# Patient Record
Sex: Female | Born: 1950 | Race: Black or African American | Hispanic: No | Marital: Married | Smoking: Former smoker
Health system: Southern US, Community
[De-identification: ages and names within clinical notes are randomized; demographics above are authoritative.]

## PROBLEM LIST (undated history)

## (undated) ENCOUNTER — Emergency Department (HOSPITAL_COMMUNITY): Admission: EM | Payer: Medicare HMO | Source: Home / Self Care

## (undated) DIAGNOSIS — I5082 Biventricular heart failure: Secondary | ICD-10-CM

## (undated) DIAGNOSIS — I10 Essential (primary) hypertension: Secondary | ICD-10-CM

## (undated) DIAGNOSIS — I2510 Atherosclerotic heart disease of native coronary artery without angina pectoris: Secondary | ICD-10-CM

## (undated) DIAGNOSIS — I509 Heart failure, unspecified: Secondary | ICD-10-CM

## (undated) DIAGNOSIS — K219 Gastro-esophageal reflux disease without esophagitis: Secondary | ICD-10-CM

## (undated) DIAGNOSIS — E7800 Pure hypercholesterolemia, unspecified: Secondary | ICD-10-CM

## (undated) DIAGNOSIS — E119 Type 2 diabetes mellitus without complications: Secondary | ICD-10-CM

## (undated) DIAGNOSIS — M109 Gout, unspecified: Secondary | ICD-10-CM

## (undated) DIAGNOSIS — K259 Gastric ulcer, unspecified as acute or chronic, without hemorrhage or perforation: Secondary | ICD-10-CM

## (undated) DIAGNOSIS — Z8739 Personal history of other diseases of the musculoskeletal system and connective tissue: Secondary | ICD-10-CM

## (undated) DIAGNOSIS — IMO0001 Reserved for inherently not codable concepts without codable children: Secondary | ICD-10-CM

## (undated) HISTORY — PX: TONSILLECTOMY: SUR1361

---

## 2002-10-01 ENCOUNTER — Encounter: Admission: RE | Admit: 2002-10-01 | Discharge: 2002-12-30 | Payer: Self-pay | Admitting: Family Medicine

## 2005-04-18 ENCOUNTER — Emergency Department (HOSPITAL_COMMUNITY): Admission: EM | Admit: 2005-04-18 | Discharge: 2005-04-18 | Payer: Self-pay | Admitting: Emergency Medicine

## 2005-05-16 ENCOUNTER — Ambulatory Visit: Payer: Self-pay | Admitting: Internal Medicine

## 2005-05-18 ENCOUNTER — Emergency Department (HOSPITAL_COMMUNITY): Admission: EM | Admit: 2005-05-18 | Discharge: 2005-05-18 | Payer: Self-pay | Admitting: Emergency Medicine

## 2005-06-22 ENCOUNTER — Ambulatory Visit: Payer: Self-pay | Admitting: Internal Medicine

## 2005-06-29 ENCOUNTER — Ambulatory Visit (HOSPITAL_COMMUNITY): Admission: RE | Admit: 2005-06-29 | Discharge: 2005-06-29 | Payer: Self-pay | Admitting: Internal Medicine

## 2005-06-29 ENCOUNTER — Ambulatory Visit: Payer: Self-pay | Admitting: Internal Medicine

## 2006-05-24 DIAGNOSIS — E1129 Type 2 diabetes mellitus with other diabetic kidney complication: Secondary | ICD-10-CM

## 2006-05-24 DIAGNOSIS — I10 Essential (primary) hypertension: Secondary | ICD-10-CM | POA: Insufficient documentation

## 2006-05-24 DIAGNOSIS — E785 Hyperlipidemia, unspecified: Secondary | ICD-10-CM

## 2006-07-21 DIAGNOSIS — R809 Proteinuria, unspecified: Secondary | ICD-10-CM | POA: Insufficient documentation

## 2006-09-12 ENCOUNTER — Emergency Department (HOSPITAL_COMMUNITY): Admission: EM | Admit: 2006-09-12 | Discharge: 2006-09-12 | Payer: Self-pay | Admitting: Emergency Medicine

## 2006-09-25 ENCOUNTER — Telehealth: Payer: Self-pay | Admitting: *Deleted

## 2006-10-03 ENCOUNTER — Encounter (HOSPITAL_COMMUNITY): Admission: RE | Admit: 2006-10-03 | Discharge: 2006-10-03 | Payer: Self-pay | Admitting: Cardiology

## 2006-10-04 ENCOUNTER — Ambulatory Visit (HOSPITAL_COMMUNITY): Admission: RE | Admit: 2006-10-04 | Discharge: 2006-10-04 | Payer: Self-pay | Admitting: Cardiovascular Disease

## 2008-10-22 ENCOUNTER — Inpatient Hospital Stay (HOSPITAL_COMMUNITY): Admission: AD | Admit: 2008-10-22 | Discharge: 2008-10-27 | Payer: Self-pay | Admitting: Cardiovascular Disease

## 2008-10-23 ENCOUNTER — Encounter (INDEPENDENT_AMBULATORY_CARE_PROVIDER_SITE_OTHER): Payer: Self-pay | Admitting: Cardiovascular Disease

## 2010-10-20 LAB — BASIC METABOLIC PANEL
BUN: 20 mg/dL (ref 6–23)
BUN: 22 mg/dL (ref 6–23)
BUN: 27 mg/dL — ABNORMAL HIGH (ref 6–23)
CO2: 26 mEq/L (ref 19–32)
CO2: 30 mEq/L (ref 19–32)
Calcium: 8.7 mg/dL (ref 8.4–10.5)
Calcium: 8.7 mg/dL (ref 8.4–10.5)
Chloride: 100 mEq/L (ref 96–112)
Chloride: 100 mEq/L (ref 96–112)
Chloride: 100 mEq/L (ref 96–112)
Creatinine, Ser: 0.95 mg/dL (ref 0.4–1.2)
Creatinine, Ser: 1.07 mg/dL (ref 0.4–1.2)
Creatinine, Ser: 1.18 mg/dL (ref 0.4–1.2)
GFR calc Af Amer: 57 mL/min — ABNORMAL LOW (ref >60)
GFR calc Af Amer: >60 mL/min (ref >60)
GFR calc non Af Amer: 47 mL/min — ABNORMAL LOW (ref >60)
GFR calc non Af Amer: >60 mL/min (ref >60)
Glucose, Bld: 112 mg/dL — ABNORMAL HIGH (ref 70–99)
Glucose, Bld: 167 mg/dL — ABNORMAL HIGH (ref 70–99)
Glucose, Bld: 178 mg/dL — ABNORMAL HIGH (ref 70–99)
Potassium: 3.4 mEq/L — ABNORMAL LOW (ref 3.5–5.1)
Potassium: 3.7 mEq/L (ref 3.5–5.1)
Potassium: 3.8 mEq/L (ref 3.5–5.1)
Sodium: 139 mEq/L (ref 135–145)
Sodium: 139 mEq/L (ref 135–145)

## 2010-10-20 LAB — GLUCOSE, CAPILLARY
Glucose-Capillary: 108 mg/dL — ABNORMAL HIGH (ref 70–99)
Glucose-Capillary: 136 mg/dL — ABNORMAL HIGH (ref 70–99)
Glucose-Capillary: 162 mg/dL — ABNORMAL HIGH (ref 70–99)
Glucose-Capillary: 165 mg/dL — ABNORMAL HIGH (ref 70–99)
Glucose-Capillary: 170 mg/dL — ABNORMAL HIGH (ref 70–99)
Glucose-Capillary: 177 mg/dL — ABNORMAL HIGH (ref 70–99)
Glucose-Capillary: 178 mg/dL — ABNORMAL HIGH (ref 70–99)
Glucose-Capillary: 215 mg/dL — ABNORMAL HIGH (ref 70–99)
Glucose-Capillary: 236 mg/dL — ABNORMAL HIGH (ref 70–99)
Glucose-Capillary: 248 mg/dL — ABNORMAL HIGH (ref 70–99)
Glucose-Capillary: 255 mg/dL — ABNORMAL HIGH (ref 70–99)
Glucose-Capillary: 85 mg/dL (ref 70–99)
Glucose-Capillary: 93 mg/dL (ref 70–99)
Glucose-Capillary: 205 mg/dL — ABNORMAL HIGH (ref 70–99)
Glucose-Capillary: 93 mg/dL (ref 70–99)

## 2010-10-20 LAB — CBC
HCT: 46.1 % — ABNORMAL HIGH (ref 36.0–46.0)
Hemoglobin: 15.5 g/dL — ABNORMAL HIGH (ref 12.0–15.0)
MCHC: 33.6 g/dL (ref 30.0–36.0)
MCV: 77.3 fL — ABNORMAL LOW (ref 78.0–100.0)
RBC: 5.96 MIL/uL — ABNORMAL HIGH (ref 3.87–5.11)

## 2010-10-20 LAB — COMPREHENSIVE METABOLIC PANEL
ALT: 17 U/L (ref 0–35)
ALT: 28 U/L (ref 0–35)
AST: 18 U/L (ref 0–37)
Albumin: 2.8 g/dL — ABNORMAL LOW (ref 3.5–5.2)
Alkaline Phosphatase: 65 U/L (ref 39–117)
BUN: 27 mg/dL — ABNORMAL HIGH (ref 6–23)
BUN: 14 mg/dL (ref 6–23)
CO2: 28 mEq/L (ref 19–32)
CO2: 22 mEq/L (ref 19–32)
Calcium: 8.8 mg/dL (ref 8.4–10.5)
Calcium: 8.8 mg/dL (ref 8.4–10.5)
Chloride: 99 mEq/L (ref 96–112)
Creatinine, Ser: 1.01 mg/dL (ref 0.4–1.2)
Creatinine, Ser: 0.95 mg/dL (ref 0.4–1.2)
GFR calc Af Amer: >60 mL/min (ref >60)
GFR calc non Af Amer: 57 mL/min — ABNORMAL LOW (ref >60)
GFR calc non Af Amer: >60 mL/min (ref >60)
Glucose, Bld: 137 mg/dL — ABNORMAL HIGH (ref 70–99)
Glucose, Bld: 268 mg/dL — ABNORMAL HIGH (ref 70–99)
Potassium: 4.1 mEq/L (ref 3.5–5.1)
Sodium: 137 mEq/L (ref 135–145)
Total Bilirubin: 0.6 mg/dL (ref 0.3–1.2)
Total Protein: 5.8 g/dL — ABNORMAL LOW (ref 6.0–8.3)

## 2010-10-20 LAB — CARDIAC PANEL(CRET KIN+CKTOT+MB+TROPI)
CK, MB: 1.7 ng/mL (ref 0.3–4.0)
Relative Index: INVALID (ref 0.0–2.5)
Total CK: 77 U/L (ref 7–177)
Troponin I: 0.05 ng/mL (ref 0.00–0.06)

## 2010-10-20 LAB — POCT I-STAT 3, ART BLOOD GAS (G3+)
Acid-base deficit: 1 mmol/L (ref 0.0–2.0)
Bicarbonate: 22.8 mEq/L (ref 20.0–24.0)
O2 Saturation: 84 %
TCO2: 24 mmol/L (ref 0–100)
pCO2 arterial: 33.7 mmHg — ABNORMAL LOW (ref 35.0–45.0)
pH, Arterial: 7.437 — ABNORMAL HIGH (ref 7.350–7.400)
pO2, Arterial: 47 mmHg — ABNORMAL LOW (ref 80.0–100.0)

## 2010-10-20 LAB — POCT I-STAT 3, VENOUS BLOOD GAS (G3P V)
Bicarbonate: 25.2 mEq/L — ABNORMAL HIGH (ref 20.0–24.0)
O2 Saturation: 31 %
TCO2: 27 mmol/L (ref 0–100)
pCO2, Ven: 43.2 mmHg — ABNORMAL LOW (ref 45.0–50.0)
pH, Ven: 7.374 — ABNORMAL HIGH (ref 7.250–7.300)
pO2, Ven: 20 mmHg — CL (ref 30.0–45.0)

## 2010-10-20 LAB — DIFFERENTIAL
Eosinophils Absolute: 0 10*3/uL (ref 0.0–0.7)
Lymphocytes Relative: 22 % (ref 12–46)
Lymphs Abs: 1.4 10*3/uL (ref 0.7–4.0)
Neutro Abs: 4.6 10*3/uL (ref 1.7–7.7)
Neutrophils Relative %: 72 % (ref 43–77)

## 2010-10-20 LAB — PROTIME-INR
INR: 1.1 (ref 0.00–1.49)
Prothrombin Time: 14.3 seconds (ref 11.6–15.2)

## 2010-10-20 LAB — APTT: aPTT: 22 seconds — ABNORMAL LOW (ref 24–37)

## 2010-10-20 LAB — HEMOGLOBIN A1C
Hgb A1c MFr Bld: 11 % — ABNORMAL HIGH (ref 4.6–6.1)
Mean Plasma Glucose: 269 mg/dL

## 2010-11-23 NOTE — Discharge Summary (Signed)
[name], [name]               ACCOUNT NO.:  [id number]   MEDICAL RECORD NO.:  [id number]          PATIENT TYPE:  INP   LOCATION:  3741                         FACILITY:  [facility]   PHYSICIAN:  [name], M.D.  DATE OF BIRTH:  09/11/1950   DATE OF ADMISSION:  10/22/2008  DATE OF DISCHARGE:  10/27/2008                               DISCHARGE SUMMARY   FINAL DIAGNOSES:  1. Uncontrolled hypertension.  2. Diabetes mellitus type 2.  3. Dilated cardiomyopathy.  4. Native vessel coronary artery disease.  5. Hypertension.  6. Obesity.   DISCHARGE MEDICATIONS:  1. Amlodipine 5 mg 1 p.o. daily.  2. Glucotrol XL 5 mg 1 twice daily.  3. Metformin 500 mg 1 twice daily.  4. Imdur 30 mg 1 daily.  5. Metoprolol 50 mg 1 twice daily.  6. Aspirin 81 mg daily.  7. Plavix 75 mg 1 daily.  8. Potassium chloride 20 mEq 1 daily.  9. Clonidine 0.1 mg 1 twice daily.  10.Hydrochlorothiazide 12.5 mg 1 capsule daily.  11.Benicar 40 mg 1 daily.  12.Prilosec 20 mg 1 daily.   DISCHARGE DIET:  Low-sodium, heart-healthy diet,  low-calorie,  carbohydrate-modified, and 1500 mL fluid-restricted diet.   WOUND CARE:  The patient to notify right groin pain, swelling, or  discharge.   ACTIVITY:  The patient to increase activity slowly and stop any activity  that causes chest pain, shortness of breath, dizziness, sweating, or  excessive weakness.   FOLLOWUP:  By Dr. [name] in 1 week.  The patient to call [phone]  for appointment.   HISTORY:  This 60 year old black female presented with dizziness and  weakness with a history of hypertension for 20 years, but out of  medications for 1 year.   PAST MEDICAL HISTORY:  Significant for diabetes mellitus for 5 years,  she quit smoking 18 years ago and also quit alcohol intake 15-20 years  ago, has a history of elevated cholesterol level in the past.   PHYSICAL EXAMINATION:  VITAL SIGNS:  Pulse 110, respirations 16, blood  pressure 200/120, height 5  feet 4 inches, and weight 208 pounds.  HEENT:  The patient is normocephalic, atraumatic, has brown eyes.  Pupils equally reacting to light.  Extraocular movements intact.  NECK:  No JVD.  No carotid bruits and has full range of motion.  LUNGS:  Clear bilaterally.  HEART:  Normal S1 and S2 with grade 2/6 systolic murmur.  ABDOMEN:  Soft.  EXTREMITIES:  Edema 2+ without cyanosis or clubbing.  CNS:  Cranial nerves grossly intact.  The patient moves all 4  extremities.   LABORATORY DATA:  Normal hemoglobin, hematocrit, WBC count, platelet  count.  Normal electrolytes, BUN, and creatinine.  Glucose elevated at  268.  Hemoglobin A1C was elevated at 11.  Subsequent glucose was down to  112.  INR was 1.1. CK-MB and troponin I negative x 2.   Chest x-ray revealed cardiomegaly and vascular congestion.   Ultrasound of the heart revealed dilated cardiomyopathy with a 20-25%  ejection fraction and diffuse hypokinesia, mild aortic regurgitation,  severe mitral regurgitation, and mild  pulmonic valve regurgitation, and  severe tricuspid regurgitation.   Cardiac catheterization showed mild multivessel native vessel coronary  artery disease, severe LV systolic dysfunction, congestive heart  failure, and pulmonary hypertension.   HOSPITAL COURSE:  The patient was admitted to telemetry unit.  Myocardial infarction was ruled out.  She received antihypertensive  medications with a good reduction in her blood pressure. With her  echocardiogram showing poor LV systolic function, she underwent right  and left heart catheterization that showed mild-to-moderate multivessel  native vessel coronary artery disease.  The patient received a few doses  of IV Lasix to decrease bilateral leg edema.  She responded very well to  medications and on October 27, 2008, she was discharged home in  satisfactory condition with blood pressure down to 128/82.  Oxygen  saturation 100% on room air.  She will be followed by me in  1 week.      [name], M.D.  Electronically Signed     ASK/MEDQ  D:  10/27/2008  T:  10/27/2008  Job:  [id number]

## 2010-11-26 NOTE — Cardiovascular Report (Signed)
NAMEDEDRA, MATSUO               ACCOUNT NO.:  [id number]   MEDICAL RECORD NO.:  [id number]          PATIENT TYPE:  OIB   LOCATION:  2899                         FACILITY:  [facility]   PHYSICIAN:  [name], M.D.  DATE OF BIRTH:  1950/08/23   DATE OF PROCEDURE:  10/04/2006  DATE OF DISCHARGE:  10/04/2006                            CARDIAC CATHETERIZATION   PROCEDURE PERFORMED BY:  [name], M.D.   PROCEDURE PERFORMED:  Left heart catheterization, selective coronary  angiography, left ventricular function, and bilateral renal artery  injections.   INDICATIONS FOR PROCEDURE:  This is a 60 year old black female who had  atypical chest pain with an abnormal EKG, and cardiac risk factors of  diabetes, hypertension.   EQUIPMENT UTILIZED:  Approached the right femoral artery using a 5-  French sheath and catheters.   COMPLICATIONS:  None.   DYE USED:  About 75 mL of dye was used.   HEMODYNAMIC DATA:  The left ventricular pressure was 165/70, and aortic  pressure was 191/110, brought down with medications to 164/95.   CORONARY ANATOMY:  The left main coronary artery had luminal  irregularities.   The left anterior descending coronary artery:  The left anterior  descending coronary artery had a proximal 30% long concentric lesion.  The rest of the vessel wrapped around the apex of the heart.  The  diagonal vessel was unremarkable.   The left circumflex coronary artery:  The left circumflex coronary  artery had luminal irregularities.  The ramus branch was a small vessel.  The obtuse marginal branch #1 was normal.  The obtuse marginal branches  #2 and #3 were very small vessels.   Right coronary artery:  The right coronary artery was dominant.  Had  proximal 20% lesion, then luminal irregularities throughout.  The  posterolateral was a large vessel and the posterior descending coronary  artery had luminal irregularities.  The right and left renal arteries  were  patent.   LEFT VENTRICULOGRAM:  The left ventriculogram showed a dilated left  ventricle with generalized hypokinesia and an ejection fraction of 30-  35%.   IMPRESSION:  1. Mild multivessel and 2-vessel coronary artery disease.  2. Moderate left ventricular systolic dysfunction.  3. Hypertensive heart disease.   RECOMMENDATIONS:  This patient will have medical therapy  antihypertensive medications, and addition of Lipitor for coronary  artery disease.  Her left ventricular function needs to be rechecked in  a few weeks post therapy.      [name], M.D.  Electronically Signed     ASK/MEDQ  D:  12/21/2006  T:  12/21/2006  Job:  [id number]

## 2011-04-06 ENCOUNTER — Emergency Department (HOSPITAL_COMMUNITY): Payer: PRIVATE HEALTH INSURANCE

## 2011-04-06 ENCOUNTER — Inpatient Hospital Stay (HOSPITAL_COMMUNITY)
Admission: EM | Admit: 2011-04-06 | Discharge: 2011-04-09 | DRG: 292 | Disposition: A | Discharge status: Home / Self Care | Payer: PRIVATE HEALTH INSURANCE | Attending: Cardiovascular Disease | Admitting: Cardiovascular Disease

## 2011-04-06 DIAGNOSIS — E119 Type 2 diabetes mellitus without complications: Secondary | ICD-10-CM | POA: Diagnosis present

## 2011-04-06 DIAGNOSIS — I5021 Acute systolic (congestive) heart failure: Principal | ICD-10-CM | POA: Diagnosis present

## 2011-04-06 DIAGNOSIS — Y9229 Other specified public building as the place of occurrence of the external cause: Secondary | ICD-10-CM

## 2011-04-06 DIAGNOSIS — S62319A Displaced fracture of base of unspecified metacarpal bone, initial encounter for closed fracture: Secondary | ICD-10-CM | POA: Diagnosis present

## 2011-04-06 DIAGNOSIS — I509 Heart failure, unspecified: Secondary | ICD-10-CM | POA: Diagnosis present

## 2011-04-06 DIAGNOSIS — I10 Essential (primary) hypertension: Secondary | ICD-10-CM | POA: Diagnosis present

## 2011-04-06 DIAGNOSIS — W010XXA Fall on same level from slipping, tripping and stumbling without subsequent striking against object, initial encounter: Secondary | ICD-10-CM | POA: Diagnosis present

## 2011-04-06 DIAGNOSIS — Y998 Other external cause status: Secondary | ICD-10-CM

## 2011-04-06 DIAGNOSIS — Z7982 Long term (current) use of aspirin: Secondary | ICD-10-CM

## 2011-04-06 DIAGNOSIS — M25569 Pain in unspecified knee: Secondary | ICD-10-CM | POA: Diagnosis present

## 2011-04-06 DIAGNOSIS — I428 Other cardiomyopathies: Secondary | ICD-10-CM | POA: Diagnosis present

## 2011-04-06 DIAGNOSIS — K219 Gastro-esophageal reflux disease without esophagitis: Secondary | ICD-10-CM | POA: Diagnosis present

## 2011-04-06 LAB — CK TOTAL AND CKMB (NOT AT ARMC)
CK, MB: 5.9 ng/mL — ABNORMAL HIGH (ref 0.3–4.0)
Total CK: 145 U/L (ref 7–177)

## 2011-04-06 LAB — URINALYSIS, ROUTINE W REFLEX MICROSCOPIC
Glucose, UA: 100 mg/dL — AB
Ketones, ur: 15 mg/dL — AB
pH: 5 (ref 5.0–8.0)

## 2011-04-06 LAB — CBC
Hemoglobin: 16.4 g/dL — ABNORMAL HIGH (ref 12.0–15.0)
MCH: 25.3 pg — ABNORMAL LOW (ref 26.0–34.0)
MCV: 73.1 fL — ABNORMAL LOW (ref 78.0–100.0)
RBC: 6.47 MIL/uL — ABNORMAL HIGH (ref 3.87–5.11)

## 2011-04-06 LAB — COMPREHENSIVE METABOLIC PANEL
Albumin: 2.4 g/dL — ABNORMAL LOW (ref 3.5–5.2)
BUN: 35 mg/dL — ABNORMAL HIGH (ref 6–23)
Creatinine, Ser: 1.42 mg/dL — ABNORMAL HIGH (ref 0.50–1.10)
GFR calc Af Amer: 46 mL/min — ABNORMAL LOW (ref >60)
Total Protein: 6.3 g/dL (ref 6.0–8.3)

## 2011-04-06 LAB — URINE MICROSCOPIC-ADD ON

## 2011-04-06 LAB — MRSA PCR SCREENING: MRSA by PCR: NEGATIVE

## 2011-04-06 LAB — POCT I-STAT, CHEM 8
BUN: 34 mg/dL — ABNORMAL HIGH (ref 6–23)
Calcium, Ion: 1.09 mmol/L — ABNORMAL LOW (ref 1.12–1.32)
Creatinine, Ser: 1.3 mg/dL — ABNORMAL HIGH (ref 0.50–1.10)
TCO2: 20 mmol/L (ref 0–100)

## 2011-04-06 LAB — DIFFERENTIAL
Lymphs Abs: 1.1 10*3/uL (ref 0.7–4.0)
Monocytes Relative: 9 % (ref 3–12)
Neutro Abs: 4.7 10*3/uL (ref 1.7–7.7)
Neutrophils Relative %: 74 % (ref 43–77)

## 2011-04-06 LAB — GLUCOSE, CAPILLARY
Glucose-Capillary: 182 mg/dL — ABNORMAL HIGH (ref 70–99)
Glucose-Capillary: 198 mg/dL — ABNORMAL HIGH (ref 70–99)
Glucose-Capillary: 221 mg/dL — ABNORMAL HIGH (ref 70–99)

## 2011-04-06 LAB — TSH: TSH: 2.021 u[IU]/mL (ref 0.350–4.500)

## 2011-04-06 LAB — MAGNESIUM: Magnesium: 1.9 mg/dL (ref 1.5–2.5)

## 2011-04-07 LAB — BASIC METABOLIC PANEL
BUN: 37 mg/dL — ABNORMAL HIGH (ref 6–23)
Chloride: 103 mEq/L (ref 96–112)
Creatinine, Ser: 1.18 mg/dL — ABNORMAL HIGH (ref 0.50–1.10)
GFR calc Af Amer: 57 mL/min — ABNORMAL LOW (ref >60)
Glucose, Bld: 128 mg/dL — ABNORMAL HIGH (ref 70–99)

## 2011-04-07 LAB — GLUCOSE, CAPILLARY
Glucose-Capillary: 116 mg/dL — ABNORMAL HIGH (ref 70–99)
Glucose-Capillary: 169 mg/dL — ABNORMAL HIGH (ref 70–99)

## 2011-04-07 LAB — CK TOTAL AND CKMB (NOT AT ARMC): Relative Index: INVALID (ref 0.0–2.5)

## 2011-04-07 LAB — TROPONIN I: Troponin I: 1.21 ng/mL (ref <0.30)

## 2011-04-07 LAB — CBC
HCT: 40.8 % (ref 36.0–46.0)
MCH: 23.1 pg — ABNORMAL LOW (ref 26.0–34.0)
MCV: 73.1 fL — ABNORMAL LOW (ref 78.0–100.0)
Platelets: 237 10*3/uL (ref 150–400)
RBC: 5.58 MIL/uL — ABNORMAL HIGH (ref 3.87–5.11)

## 2011-04-08 LAB — GLUCOSE, CAPILLARY
Glucose-Capillary: 87 mg/dL (ref 70–99)
Glucose-Capillary: 123 mg/dL — ABNORMAL HIGH (ref 70–99)

## 2011-04-08 LAB — CBC
HCT: 44 % (ref 36.0–46.0)
Hemoglobin: 14.3 g/dL (ref 12.0–15.0)
RBC: 6 MIL/uL — ABNORMAL HIGH (ref 3.87–5.11)
WBC: 5.1 10*3/uL (ref 4.0–10.5)

## 2011-04-08 LAB — BASIC METABOLIC PANEL
CO2: 26 mEq/L (ref 19–32)
Calcium: 8.8 mg/dL (ref 8.4–10.5)
Chloride: 101 mEq/L (ref 96–112)
Glucose, Bld: 87 mg/dL (ref 70–99)
Sodium: 138 mEq/L (ref 135–145)

## 2011-04-09 LAB — BASIC METABOLIC PANEL
Calcium: 9.1 mg/dL (ref 8.4–10.5)
Creatinine, Ser: 1.13 mg/dL — ABNORMAL HIGH (ref 0.50–1.10)
GFR calc Af Amer: 60 mL/min — ABNORMAL LOW (ref >60)
GFR calc non Af Amer: 49 mL/min — ABNORMAL LOW (ref >60)

## 2011-04-09 LAB — GLUCOSE, CAPILLARY: Glucose-Capillary: 214 mg/dL — ABNORMAL HIGH (ref 70–99)

## 2011-04-09 LAB — CBC
MCH: 24.3 pg — ABNORMAL LOW (ref 26.0–34.0)
MCHC: 32.7 g/dL (ref 30.0–36.0)
MCV: 74.3 fL — ABNORMAL LOW (ref 78.0–100.0)
Platelets: 284 10*3/uL (ref 150–400)
RDW: 16.5 % — ABNORMAL HIGH (ref 11.5–15.5)
WBC: 4.8 10*3/uL (ref 4.0–10.5)

## 2011-04-09 NOTE — Discharge Summary (Signed)
[name], [name]               ACCOUNT NO.:  [id number]  MEDICAL RECORD NO.:  [id number]  LOCATION:  4709                         FACILITY:  [facility]  PHYSICIAN:  [name], M.D.  DATE OF BIRTH:  March 03, 1951  DATE OF ADMISSION:  04/06/2011 DATE OF DISCHARGE:  04/09/2011                              DISCHARGE SUMMARY   FINAL DIAGNOSES: 1. Acute left heart systolic failure. 2. Acute left fifth metatarsal fracture. 3. Hypertension. 4. Diabetes mellitus, type 2. 5. Dilated cardiomyopathy. 6. Coronary artery disease.  DISCHARGE MEDICATIONS: 1. Amlodipine 5 mg one twice daily. 2. Clonidine 0.1 mg one twice daily. 3. Lanoxin 0.125 mg daily. 4. Lasix 40 mg daily. 5. BiDil 1 tablet three times daily. 6. Potassium chloride 10 mEq daily. 7. Spironolactone 25 mg daily. 8. Metoprolol tartrate 50 mg half a tablet twice daily. 9. Aspirin 81 mg daily. 10.Metformin 500 mg one twice daily.  The patient to discontinue taking hydrochlorothiazide.  DISCHARGE DIET:  Low-sodium heart-healthy diet and carbohydrate-modified medium calorie diet.  DISCHARGE ACTIVITY:  The patient to increase activity gradually as tolerated.  CONDITION ON DISCHARGE:  Improved.  Followup by Dr. [name] in 1 week and by Dr. Clarisa Kindred physician in 1 week.  HISTORY:  This 59 year old black female presented with progressively worsening shortness of breath, leg edema along with fall few hours ago that resulted in fracture of fifth metacarpal of the left hand along with mild bruises of both knees.  The patient did not have any loss of consciousness or head injury.  PHYSICAL EXAMINATION:  VITAL SIGNS:  Temperature 98, pulse 90, respirations 20, blood pressure 160/100, height 5 feet 4 inches, weight 175 pounds with a BMI of 30. GENERAL:  The patient is averagely built well-nourished black female in mild distress. HEENT: The patient is normocephalic, atraumatic.  Has brown eyes. Conjunctivae  pink.  Sclerae nonicteric.  Wears reading glasses and partial upper and lower dentures. NECK:  No JVD, no carotid bruit. LUNGS:  Clear bilaterally and has had basilar crackles. HEART:  Normal S1 and S2 with grade 2/6 systolic murmur. ABDOMEN:  Soft. EXTREMITIES:  2+ edema. SKIN:  Warm and dry. NEUROLOGIC:  The patient moves all four extremities and she is right- handed. Left hand and forearm in splint.  LABORATORY DATA:  Normal hemoglobin/hematocrit, WBC count, platelet count.  Normal electrolytes.  BUN slightly high at 34, creatinine 1.3, sugar elevated at 272.  CK-MB slightly high at 5.9.  Troponin-I slightly elevated at 1.54.  B-natriuretic peptide elevated at 8379.   X-ray of the left knee showed small effusion.    X-ray of the right knee was unremarkable.    X-ray of the left wrist showed a slightly displaced fifth metacarpal  (boxer's) fracture.  Chest x-ray showed cardiomegaly without significant vascular congestion.  EKG showed sinus rhythm with biatrial abnormalities and right axis deviation.  HOSPITAL COURSE:  The patient was admitted to telemetry unit.  She had minimal elevation of cardiac enzymes secondary to acute left heart systolic failure.  Her leg edema responded to IV Lasix.  She was less short of breath after diuresis.  Orthopedic tech applied splint on the left wrist and forearm area.  Her condition improved and on April 09, 2011, she was discharged home with addition of blood pressure medications, potassium, spironolactone, and Lanoxin.  She will be followed by me in 1 week and by orthopedic physician, Dr. Otelia Sergeant in 1 week.     [name], M.D.     ASK/MEDQ  D:  04/09/2011  T:  04/09/2011  Job:  [id number]  Electronically Signed by [name] M.D. on 04/09/2011 08:48:09 PM

## 2011-04-11 LAB — GLUCOSE, CAPILLARY: Glucose-Capillary: 237 mg/dL — ABNORMAL HIGH (ref 70–99)

## 2011-07-12 HISTORY — PX: CORONARY ANGIOPLASTY WITH STENT PLACEMENT: SHX49

## 2011-11-23 ENCOUNTER — Encounter (HOSPITAL_COMMUNITY): Payer: Self-pay

## 2011-11-23 ENCOUNTER — Emergency Department (HOSPITAL_COMMUNITY): Payer: Self-pay

## 2011-11-23 ENCOUNTER — Inpatient Hospital Stay (HOSPITAL_COMMUNITY)
Admission: EM | Admit: 2011-11-23 | Discharge: 2011-11-29 | DRG: 287 | Disposition: A | Discharge status: Home / Self Care | Payer: MEDICAID | Attending: Cardiovascular Disease | Admitting: Cardiovascular Disease

## 2011-11-23 DIAGNOSIS — J449 Chronic obstructive pulmonary disease, unspecified: Secondary | ICD-10-CM | POA: Diagnosis present

## 2011-11-23 DIAGNOSIS — Z9861 Coronary angioplasty status: Secondary | ICD-10-CM

## 2011-11-23 DIAGNOSIS — I10 Essential (primary) hypertension: Secondary | ICD-10-CM | POA: Diagnosis present

## 2011-11-23 DIAGNOSIS — I2589 Other forms of chronic ischemic heart disease: Secondary | ICD-10-CM | POA: Diagnosis present

## 2011-11-23 DIAGNOSIS — E119 Type 2 diabetes mellitus without complications: Secondary | ICD-10-CM | POA: Diagnosis present

## 2011-11-23 DIAGNOSIS — I509 Heart failure, unspecified: Secondary | ICD-10-CM | POA: Diagnosis present

## 2011-11-23 DIAGNOSIS — I2510 Atherosclerotic heart disease of native coronary artery without angina pectoris: Secondary | ICD-10-CM | POA: Diagnosis present

## 2011-11-23 DIAGNOSIS — F411 Generalized anxiety disorder: Secondary | ICD-10-CM | POA: Diagnosis present

## 2011-11-23 DIAGNOSIS — E8809 Other disorders of plasma-protein metabolism, not elsewhere classified: Secondary | ICD-10-CM | POA: Diagnosis present

## 2011-11-23 DIAGNOSIS — I5041 Acute combined systolic (congestive) and diastolic (congestive) heart failure: Secondary | ICD-10-CM | POA: Diagnosis present

## 2011-11-23 DIAGNOSIS — Z955 Presence of coronary angioplasty implant and graft: Secondary | ICD-10-CM

## 2011-11-23 DIAGNOSIS — I38 Endocarditis, valve unspecified: Secondary | ICD-10-CM | POA: Diagnosis present

## 2011-11-23 DIAGNOSIS — J4489 Other specified chronic obstructive pulmonary disease: Secondary | ICD-10-CM | POA: Diagnosis present

## 2011-11-23 DIAGNOSIS — E7800 Pure hypercholesterolemia, unspecified: Secondary | ICD-10-CM | POA: Diagnosis present

## 2011-11-23 HISTORY — DX: Essential (primary) hypertension: I10

## 2011-11-23 LAB — CBC
HCT: 45.1 % (ref 36.0–46.0)
Hemoglobin: 15.2 g/dL — ABNORMAL HIGH (ref 12.0–15.0)
MCH: 24.5 pg — ABNORMAL LOW (ref 26.0–34.0)
MCH: 24.3 pg — ABNORMAL LOW (ref 26.0–34.0)
MCHC: 33.9 g/dL (ref 30.0–36.0)
MCHC: 33.7 g/dL (ref 30.0–36.0)
MCV: 72 fL — ABNORMAL LOW (ref 78.0–100.0)
RDW: 15.6 % — ABNORMAL HIGH (ref 11.5–15.5)
RDW: 15.6 % — ABNORMAL HIGH (ref 11.5–15.5)

## 2011-11-23 LAB — DIFFERENTIAL
Basophils Absolute: 0 10*3/uL (ref 0.0–0.1)
Basophils Relative: 0 % (ref 0–1)
Eosinophils Absolute: 0 10*3/uL (ref 0.0–0.7)
Neutro Abs: 2.7 10*3/uL (ref 1.7–7.7)
Neutrophils Relative %: 52 % (ref 43–77)

## 2011-11-23 LAB — CARDIAC PANEL(CRET KIN+CKTOT+MB+TROPI)
CK, MB: 3 ng/mL (ref 0.3–4.0)
Relative Index: INVALID (ref 0.0–2.5)
Total CK: 64 U/L (ref 7–177)

## 2011-11-23 LAB — COMPREHENSIVE METABOLIC PANEL
AST: 14 U/L (ref 0–37)
Albumin: 2.3 g/dL — ABNORMAL LOW (ref 3.5–5.2)
Alkaline Phosphatase: 90 U/L (ref 39–117)
Chloride: 101 mEq/L (ref 96–112)
Creatinine, Ser: 0.87 mg/dL (ref 0.50–1.10)
Potassium: 3.5 mEq/L (ref 3.5–5.1)
Total Bilirubin: 0.7 mg/dL (ref 0.3–1.2)
Total Protein: 6.7 g/dL (ref 6.0–8.3)

## 2011-11-23 LAB — PRO B NATRIURETIC PEPTIDE: Pro B Natriuretic peptide (BNP): 15857 pg/mL — ABNORMAL HIGH (ref 0–125)

## 2011-11-23 LAB — CK TOTAL AND CKMB (NOT AT ARMC)
Relative Index: INVALID (ref 0.0–2.5)
Total CK: 73 U/L (ref 7–177)

## 2011-11-23 LAB — TROPONIN I: Troponin I: <0.3 ng/mL (ref <0.30)

## 2011-11-23 LAB — GLUCOSE, CAPILLARY: Glucose-Capillary: 169 mg/dL — ABNORMAL HIGH (ref 70–99)

## 2011-11-23 MED ORDER — METFORMIN HCL 500 MG PO TABS
500.0000 mg | ORAL_TABLET | Freq: Two times a day (BID) | ORAL | Status: DC
  Administered 2011-11-24 – 2011-11-25 (×3): 500 mg via ORAL
  Filled 2011-11-23 (×5): qty 1

## 2011-11-23 MED ORDER — SODIUM CHLORIDE 0.9 % IV SOLN: 250.0000 mL | INTRAVENOUS | Status: DC | PRN

## 2011-11-23 MED ORDER — SPIRONOLACTONE 25 MG PO TABS
25.0000 mg | ORAL_TABLET | Freq: Every day | ORAL | Status: DC
  Administered 2011-11-23 – 2011-11-26 (×3): 25 mg via ORAL
  Filled 2011-11-23 (×4): qty 1

## 2011-11-23 MED ORDER — POTASSIUM CHLORIDE CRYS ER 20 MEQ PO TBCR
20.0000 meq | EXTENDED_RELEASE_TABLET | Freq: Two times a day (BID) | ORAL | Status: DC
  Administered 2011-11-23 – 2011-11-29 (×11): 20 meq via ORAL
  Filled 2011-11-23 (×15): qty 1

## 2011-11-23 MED ORDER — AMLODIPINE BESYLATE 5 MG PO TABS: 5.0000 mg | ORAL_TABLET | Freq: Every day | ORAL | Status: DC

## 2011-11-23 MED ORDER — ISOSORB DINITRATE-HYDRALAZINE 20-37.5 MG PO TABS
1.0000 | ORAL_TABLET | Freq: Two times a day (BID) | ORAL | Status: DC
  Filled 2011-11-23 (×3): qty 1

## 2011-11-23 MED ORDER — ACETAMINOPHEN 325 MG PO TABS: 650.0000 mg | ORAL_TABLET | ORAL | Status: DC | PRN

## 2011-11-23 MED ORDER — FUROSEMIDE 10 MG/ML IJ SOLN
40.0000 mg | Freq: Once | INTRAMUSCULAR | Status: AC
  Administered 2011-11-23: 40 mg via INTRAVENOUS
  Filled 2011-11-23: qty 4

## 2011-11-23 MED ORDER — CARVEDILOL 3.125 MG PO TABS
3.1250 mg | ORAL_TABLET | Freq: Two times a day (BID) | ORAL | Status: DC
  Filled 2011-11-23: qty 1

## 2011-11-23 MED ORDER — HEPARIN SODIUM (PORCINE) 5000 UNIT/ML IJ SOLN
5000.0000 [IU] | Freq: Three times a day (TID) | INTRAMUSCULAR | Status: DC
  Administered 2011-11-23 – 2011-11-25 (×5): 5000 [IU] via SUBCUTANEOUS
  Filled 2011-11-23 (×9): qty 1

## 2011-11-23 MED ORDER — ALPRAZOLAM 0.25 MG PO TABS: 0.2500 mg | ORAL_TABLET | Freq: Two times a day (BID) | ORAL | Status: DC | PRN

## 2011-11-23 MED ORDER — DIGOXIN 125 MCG PO TABS
0.1250 mg | ORAL_TABLET | Freq: Every day | ORAL | Status: DC
Start: 2011-11-23 — End: 2011-11-29
  Administered 2011-11-23 – 2011-11-29 (×6): 0.125 mg via ORAL
  Filled 2011-11-23 (×7): qty 1

## 2011-11-23 MED ORDER — ONDANSETRON HCL 4 MG/2ML IJ SOLN: 4.0000 mg | Freq: Four times a day (QID) | INTRAMUSCULAR | Status: DC | PRN

## 2011-11-23 MED ORDER — IRBESARTAN 150 MG PO TABS
150.0000 mg | ORAL_TABLET | Freq: Every day | ORAL | Status: DC
  Administered 2011-11-23 – 2011-11-29 (×6): 150 mg via ORAL
  Filled 2011-11-23 (×7): qty 1

## 2011-11-23 MED ORDER — SODIUM CHLORIDE 0.9 % IJ SOLN: 3.0000 mL | INTRAMUSCULAR | Status: DC | PRN

## 2011-11-23 MED ORDER — ASPIRIN EC 81 MG PO TBEC
81.0000 mg | DELAYED_RELEASE_TABLET | Freq: Every day | ORAL | Status: DC
  Administered 2011-11-23 – 2011-11-29 (×6): 81 mg via ORAL
  Filled 2011-11-23 (×7): qty 1

## 2011-11-23 MED ORDER — FUROSEMIDE 10 MG/ML IJ SOLN
80.0000 mg | Freq: Two times a day (BID) | INTRAMUSCULAR | Status: DC
  Filled 2011-11-23 (×2): qty 8

## 2011-11-23 MED ORDER — SODIUM CHLORIDE 0.9 % IJ SOLN
3.0000 mL | Freq: Two times a day (BID) | INTRAMUSCULAR | Status: DC
Start: 2011-11-23 — End: 2011-11-29
  Administered 2011-11-24 – 2011-11-28 (×7): 3 mL via INTRAVENOUS

## 2011-11-23 MED ORDER — CLONIDINE HCL 0.1 MG PO TABS
0.1000 mg | ORAL_TABLET | Freq: Every day | ORAL | Status: DC
  Administered 2011-11-23 – 2011-11-24 (×2): 0.1 mg via ORAL
  Filled 2011-11-23 (×3): qty 1

## 2011-11-23 NOTE — ED Provider Notes (Signed)
History     CSN: [id number]  Arrival date & time 11/23/11  1446   First MD Initiated Contact with Patient 11/23/11 1614      Chief Complaint  Patient presents with  . Shortness of Breath    (Consider location/radiation/quality/duration/timing/severity/associated sxs/prior treatment) HPI  61 year old female with history of hypertension, diabetes, and congestive heart failure presents with pedal edema and shortness of breath.  Patient states for the past month she has noticed a gradual increase in fluid to her lower extremities. States the fluid has make it difficult for her to ambulate.  Reports falling forward 2 weeks ago due to leg weakness.  Denies hitting head of LOC.   She complain of fatigued, and having increased shortness of breath with ambulating. Patient states her shortness of breath increases if she lies flat. She sleeps with 4 pillows. Patient has been taking her Lasix as prescribed. She denies fever, chills, chest pain, cough, hemoptysis, or calf pain. She denies any recent travel, or prolonged bed rest. She does not normally weigh herself, but admits that she is gaining a lot of fluid.    Past Medical History  Diagnosis Date  . Hypertension   . Diabetes mellitus   . Heart failure     History reviewed. No pertinent past surgical history.  History reviewed. No pertinent family history.  History  Substance Use Topics  . Smoking status: Never Smoker   . Smokeless tobacco: Not on file  . Alcohol Use: No    OB History    Grav Para Term Preterm Abortions TAB SAB Ect Mult Living                  Review of Systems  All other systems reviewed and are negative.    Allergies  Review of patient's allergies indicates no known allergies.  Home Medications   Current Outpatient Rx  Name Route Sig Dispense Refill  . AMLODIPINE BESYLATE 5 MG PO TABS Oral Take 5 mg by mouth 2 (two) times daily.    . ASPIRIN EC 81 MG PO TBEC Oral Take 81 mg by mouth daily.    Marland Kitchen  CLONIDINE HCL 0.1 MG PO TABS Oral Take 0.05 mg by mouth at bedtime.     Marland Kitchen DIGOXIN 0.125 MG PO TABS Oral Take 125 mcg by mouth daily.    . FUROSEMIDE 40 MG PO TABS Oral Take 40 mg by mouth daily.    Marland Kitchen METFORMIN HCL 500 MG PO TABS Oral Take 500 mg by mouth 2 (two) times daily with a meal.    . POTASSIUM CHLORIDE ER 10 MEQ PO TBCR Oral Take 10 mEq by mouth every other day. 11/21/11 last dose.    Marland Kitchen SPIRONOLACTONE 25 MG PO TABS Oral Take 25 mg by mouth daily.      BP 180/157  Pulse 120  Temp(Src) 98 F (36.7 C) (Oral)  Resp 18  SpO2 100%  Physical Exam  Nursing note and vitals reviewed. Constitutional: She appears well-developed and well-nourished. No distress.       Awake, alert, nontoxic appearance  HENT:  Head: Atraumatic.  Eyes: Conjunctivae are normal. Right eye exhibits no discharge. Left eye exhibits no discharge.  Neck: Neck supple. No JVD present.  Cardiovascular: Normal rate and regular rhythm.   Pulmonary/Chest: Effort normal. No respiratory distress. She has rales. She exhibits no tenderness.  Abdominal: Soft. There is no tenderness. There is no rebound.  Musculoskeletal: She exhibits no tenderness.       ROM  appears intact, no obvious focal weakness. 2+ pitting edema to lower extremities bilaterally. Brisk cap refill. Negative Homans sign.  Neurological: She is alert.       Mental status and motor strength appears intact  Skin: No rash noted.  Psychiatric: She has a normal mood and affect.    ED Course  Procedures (including critical care time)  Labs Reviewed  CBC - Abnormal; Notable for the following:    RBC 6.49 (*)    Hemoglobin 15.9 (*)    HCT 46.9 (*)    MCV 72.3 (*)    MCH 24.5 (*)    RDW 15.6 (*)    All other components within normal limits  DIFFERENTIAL  COMPREHENSIVE METABOLIC PANEL  PRO B NATRIURETIC PEPTIDE  CK TOTAL AND CKMB  TROPONIN I   Dg Chest 2 View  11/23/2011  *RADIOLOGY REPORT*  Clinical Data: Shortness of breath.  Cough.  CHEST - 2 VIEW   Comparison: Chest x-ray 04/06/2011.  Findings: There is a linear opacity in the lingula, which is unchanged compared to the prior examination, and compatible with an area of scarring.  No consolidative airspace disease.  No pleural effusions.  Pulmonary venous engorgement without frank pulmonary edema.  Mild - moderate cardiomegaly is similar to the prior examination, again with prominence of the left ventricular contour which suggests underlying left ventricular hypertrophy. Mediastinal contours are unremarkable.  Atherosclerotic calcifications within the arch of the aorta.  IMPRESSION: 1.  Cardiomegaly with pulmonary venous congestion (no frank pulmonary edema at this time). 2.  Atherosclerosis.  Original Report Authenticated By: Florencia Reasons, M.D.     No diagnosis found.   Date: 11/23/2011  Rate: 115  Rhythm: sinus tachycardia  QRS Axis: right  Intervals: normal  ST/T Wave abnormalities: nonspecific ST/T changes  Conduction Disutrbances:none  Narrative Interpretation: biatrial enlargement  Old EKG Reviewed: unchanged    MDM  Hx of CHF presents with increase pedal edema and sob.  No chest pain.  No fever, or cough.  Pt has 2+ pitting edema.  She is currently taking Lasix 40mg  once daily.  Her Pro BNP is 15,857, last Pro BNP was 8379 seven months ago.  ECG and CXR reviewed by me.  Discussed with my attending.    5:23 PM Pt with CHF exacerbation.  CXR shows vascular congestion without overt edeme, unchanged ECG, neg troponin. Lasix given via IV.  I have consulted with [name] who agrees to see pt in ED and will plan appropriate dispo.  Pt currently resting comfortably.       Fayrene Helper, PA-C 11/24/11 1513

## 2011-11-23 NOTE — ED Notes (Signed)
Pt complains that she is ful lof fluid and sob.

## 2011-11-23 NOTE — H&P (Signed)
[name] is an 61 y.o. female.   Chief Complaint: Shortness of breath. HPI: 61 years old black female with DM, II and hypertension has progressive leg edema and shortness of breath. Feeling little better with IV lasix. No fever or cough.  Past Medical History  Diagnosis Date  . Hypertension   . Diabetes mellitus   . Heart failure       History reviewed. No pertinent past surgical history.  History reviewed. No pertinent family history. Social History:  reports that she has never smoked. She does not have any smokeless tobacco history on file. She reports that she does not drink alcohol or use illicit drugs.  Allergies: No Known Allergies   (Not in a hospital admission)  Results for orders placed during the hospital encounter of 11/23/11 (from the past 48 hour([name]))  PRO B NATRIURETIC PEPTIDE     Status: Abnormal   Collection Time   11/23/11  3:20 PM      Component Value Range Comment   Pro B Natriuretic peptide (BNP) 15857.0 (*) 0 - 125 (pg/mL)   CK TOTAL AND CKMB     Status: Normal   Collection Time   11/23/11  3:20 PM      Component Value Range Comment   Total CK 73  7 - 177 (U/L)    CK, MB 3.7  0.3 - 4.0 (ng/mL)    Relative Index RELATIVE INDEX IS INVALID  0.0 - 2.5    TROPONIN I     Status: Normal   Collection Time   11/23/11  3:20 PM      Component Value Range Comment   Troponin I <0.30  <0.30 (ng/mL)   CBC     Status: Abnormal   Collection Time   11/23/11  3:29 PM      Component Value Range Comment   WBC 5.3  4.0 - 10.5 (K/uL)    RBC 6.49 (*) 3.87 - 5.11 (MIL/uL)    Hemoglobin 15.9 (*) 12.0 - 15.0 (g/dL)    HCT 91.4 (*) 78.2 - 46.0 (%)    MCV 72.3 (*) 78.0 - 100.0 (fL)    MCH 24.5 (*) 26.0 - 34.0 (pg)    MCHC 33.9  30.0 - 36.0 (g/dL)    RDW 95.6 (*) 21.3 - 15.5 (%)    Platelets 296  150 - 400 (K/uL)   DIFFERENTIAL     Status: Normal   Collection Time   11/23/11  3:29 PM      Component Value Range Comment   Neutrophils Relative 52  43 - 77 (%)    Neutro  Abs 2.7  1.7 - 7.7 (K/uL)    Lymphocytes Relative 38  12 - 46 (%)    Lymphs Abs 2.0  0.7 - 4.0 (K/uL)    Monocytes Relative 10  3 - 12 (%)    Monocytes Absolute 0.5  0.1 - 1.0 (K/uL)    Eosinophils Relative 0  0 - 5 (%)    Eosinophils Absolute 0.0  0.0 - 0.7 (K/uL)    Basophils Relative 0  0 - 1 (%)    Basophils Absolute 0.0  0.0 - 0.1 (K/uL)   COMPREHENSIVE METABOLIC PANEL     Status: Abnormal   Collection Time   11/23/11  3:29 PM      Component Value Range Comment   Sodium 138  135 - 145 (mEq/L)    Potassium 3.5  3.5 - 5.1 (mEq/L)    Chloride 101  96 -  112 (mEq/L)    CO2 23  19 - 32 (mEq/L)    Glucose, Bld 196 (*) 70 - 99 (mg/dL)    BUN 32 (*) 6 - 23 (mg/dL)    Creatinine, Ser 1.61  0.50 - 1.10 (mg/dL)    Calcium 8.6  8.4 - 10.5 (mg/dL)    Total Protein 6.7  6.0 - 8.3 (g/dL)    Albumin 2.3 (*) 3.5 - 5.2 (g/dL)    AST 14  0 - 37 (U/L)    ALT 10  0 - 35 (U/L)    Alkaline Phosphatase 90  39 - 117 (U/L)    Total Bilirubin 0.7  0.3 - 1.2 (mg/dL)    GFR calc non Af Amer 72 (*) >90 (mL/min)    GFR calc Af Amer 83 (*) >90 (mL/min)   GLUCOSE, CAPILLARY     Status: Abnormal   Collection Time   11/23/11  4:33 PM      Component Value Range Comment   Glucose-Capillary 169 (*) 70 - 99 (mg/dL)    Comment 1 Documented in Chart      Comment 2 Notify RN      Dg Chest 2 View  11/23/2011  *RADIOLOGY REPORT*  Clinical Data: Shortness of breath.  Cough.  CHEST - 2 VIEW  Comparison: Chest x-ray 04/06/2011.  Findings: There is a linear opacity in the lingula, which is unchanged compared to the prior examination, and compatible with an area of scarring.  No consolidative airspace disease.  No pleural effusions.  Pulmonary venous engorgement without frank pulmonary edema.  Mild - moderate cardiomegaly is similar to the prior examination, again with prominence of the left ventricular contour which suggests underlying left ventricular hypertrophy. Mediastinal contours are unremarkable.  Atherosclerotic  calcifications within the arch of the aorta.  IMPRESSION: 1.  Cardiomegaly with pulmonary venous congestion (no frank pulmonary edema at this time). 2.  Atherosclerosis.  Original Report Authenticated By: Florencia Reasons, M.D.    @ROS @ + wears glasses, + weight gain + partial dentures, + COPD, + angina, + leg edema, + hypertension, + DM, II, + CHF, No CVA, No seizures.  Physical Exam: Blood pressure 180/157, pulse 120, temperature 98 F (36.7 C), temperature source Oral, resp. rate 18, SpO2 100.00%. HEENT: [place]/AT, Ave built and nourished. [employer], PERL, EOMI.  Neck: + JVD, no bruit, no thyromegaly. Lungs: Bilateral basal crackles.  Heart: Normal S1 and S2. Grade III/VI systolic murmur LSB Abdomen: Mild swelling, non-tender. Ext: 2 + edema up to knees, bilaterally. CNS: Cranial nerves grossly intact. Skin: Warm and dry.  Assessment/Plan Acute systolic and diastolic heart failure DM, II Hypertension Anxiety COPD  IV Lasix + Home medications + O2  [name] 11/23/2011, 6:14 PM

## 2011-11-24 LAB — CARDIAC PANEL(CRET KIN+CKTOT+MB+TROPI)
CK, MB: 2.8 ng/mL (ref 0.3–4.0)
CK, MB: 2.8 ng/mL (ref 0.3–4.0)
Total CK: 45 U/L (ref 7–177)

## 2011-11-24 LAB — TSH: TSH: 2.624 u[IU]/mL (ref 0.350–4.500)

## 2011-11-24 LAB — GLUCOSE, CAPILLARY
Glucose-Capillary: 304 mg/dL — ABNORMAL HIGH (ref 70–99)
Glucose-Capillary: 119 mg/dL — ABNORMAL HIGH (ref 70–99)

## 2011-11-24 LAB — BASIC METABOLIC PANEL
CO2: 21 mEq/L (ref 19–32)
Calcium: 8.4 mg/dL (ref 8.4–10.5)
Creatinine, Ser: 1.08 mg/dL (ref 0.50–1.10)
Glucose, Bld: 123 mg/dL — ABNORMAL HIGH (ref 70–99)

## 2011-11-24 MED ORDER — AMLODIPINE BESYLATE 5 MG PO TABS
5.0000 mg | ORAL_TABLET | Freq: Every day | ORAL | Status: DC
  Administered 2011-11-24 – 2011-11-29 (×6): 5 mg via ORAL
  Filled 2011-11-24 (×7): qty 1

## 2011-11-24 MED ORDER — INSULIN ASPART 100 UNIT/ML ~~LOC~~ SOLN
0.0000 [IU] | Freq: Every day | SUBCUTANEOUS | Status: DC
  Administered 2011-11-24: 4 [IU] via SUBCUTANEOUS
  Administered 2011-11-25: 2 [IU] via SUBCUTANEOUS

## 2011-11-24 MED ORDER — INSULIN ASPART 100 UNIT/ML ~~LOC~~ SOLN: 1.0000 [IU] | Freq: Three times a day (TID) | SUBCUTANEOUS | Status: DC

## 2011-11-24 MED ORDER — AMLODIPINE BESYLATE 5 MG PO TABS
5.0000 mg | ORAL_TABLET | Freq: Once | ORAL | Status: AC
  Administered 2011-11-24: 5 mg via ORAL
  Filled 2011-11-24: qty 1

## 2011-11-24 MED ORDER — CARVEDILOL 3.125 MG PO TABS
3.1250 mg | ORAL_TABLET | Freq: Two times a day (BID) | ORAL | Status: DC
  Administered 2011-11-24 – 2011-11-25 (×4): 3.125 mg via ORAL
  Filled 2011-11-24 (×6): qty 1

## 2011-11-24 MED ORDER — INSULIN ASPART 100 UNIT/ML ~~LOC~~ SOLN
0.0000 [IU] | Freq: Three times a day (TID) | SUBCUTANEOUS | Status: DC
  Administered 2011-11-24: 3 [IU] via SUBCUTANEOUS

## 2011-11-24 MED ORDER — AMLODIPINE BESYLATE 5 MG PO TABS: 5.0000 mg | ORAL_TABLET | Freq: Every day | ORAL | Status: DC

## 2011-11-24 MED ORDER — ALBUMIN HUMAN 25 % IV SOLN
12.5000 g | Freq: Once | INTRAVENOUS | Status: AC
  Administered 2011-11-24: 12.5 g via INTRAVENOUS
  Filled 2011-11-24: qty 50

## 2011-11-24 MED ORDER — FUROSEMIDE 10 MG/ML IJ SOLN
80.0000 mg | Freq: Two times a day (BID) | INTRAMUSCULAR | Status: DC
  Administered 2011-11-24 (×2): 80 mg via INTRAVENOUS
  Filled 2011-11-24 (×5): qty 8

## 2011-11-24 NOTE — Progress Notes (Signed)
Inpatient Diabetes Program Recommendations  AACE/ADA: New Consensus Statement on Inpatient Glycemic Control (2009)  Target Ranges:  Prepandial:   less than 140 mg/dL      Peak postprandial:   less than 180 mg/dL (1-2 hours)      Critically ill patients:  140 - 180 mg/dL   Reason for Visit: Lab glucose elevated  Results for NANDINI, BOGDANSKI (MRN [id number]) as of 11/24/2011 15:21  Ref. Range 11/23/2011 16:33 11/24/2011 05:21  Glucose-Capillary Latest Range: 70-99 mg/dL 244 (H) 010 (H)  Results for BERTA, DENSON (MRN [id number]) as of 11/24/2011 15:21  Ref. Range 11/23/2011 15:29 11/24/2011 06:10  Glucose Latest Range: 70-99 mg/dL 034 (H) 742 (H)    Inpatient Diabetes Program Recommendations HgbA1C: Update HgbA1C to assess glycemic control prior to hospitalization -  Diet: Add CHO-mod medium to Heart Healthy diet  Note: Will follow.

## 2011-11-24 NOTE — Progress Notes (Signed)
Pt bp has not dropped from all medications given. Called [name] and made him aware of bp now 168/114. Orders given. Will continue to monitor.

## 2011-11-24 NOTE — Progress Notes (Signed)
  Echocardiogram 2D Echocardiogram has been performed.  [name] 11/24/2011, 3:30 PM

## 2011-11-24 NOTE — Progress Notes (Signed)
Subjective:  Feeling better. Shortness of breath and leg edema improving.  Objective:  Vital Signs in the last 24 hours: Temp:  [98 F (36.7 C)-98.1 F (36.7 C)] 98.1 F (36.7 C) (05/16 0513) Pulse Rate:  [78-120] 78  (05/16 0513) Cardiac Rhythm:  [-] Normal sinus rhythm (05/16 0820) Resp:  [18-20] 20  (05/16 0513) BP: (140-207)/(92-157) 151/92 mmHg (05/16 0824) SpO2:  [95 %-100 %] 100 % (05/16 0513) Weight:  [77.928 kg (171 lb 12.8 oz)] 77.928 kg (171 lb 12.8 oz) (05/16 0409)  Physical Exam: BP Readings from Last 1 Encounters:  11/24/11 151/92    Wt Readings from Last 1 Encounters:  11/24/11 77.928 kg (171 lb 12.8 oz)    Weight change:   HEENT: [place]/AT, Eyes-Brown, PERL, EOMI, Conjunctiva-Pink, Sclera-Non-icteric Neck: + JVD, No bruit, Trachea midline. Lungs:  Clearing, Bilateral. Cardiac:  Regular rhythm, normal S1 and S2, no S3.  Abdomen:  Soft, non-tender. Extremities:  2 + edema present. No cyanosis. No clubbing. CNS: AxOx3, Cranial nerves grossly intact, moves all 4 extremities. Right handed. Skin: Warm and dry.   Intake/Output from previous day: 05/15 0701 - 05/16 0700 In: -  Out: 900 [Urine:900]    Lab Results: BMET    Component Value Date/Time   NA 137 11/24/2011 0610   K 3.9 11/24/2011 0610   CL 104 11/24/2011 0610   CO2 21 11/24/2011 0610   GLUCOSE 123* 11/24/2011 0610   BUN 33* 11/24/2011 0610   CREATININE 1.08 11/24/2011 0610   CALCIUM 8.4 11/24/2011 0610   GFRNONAA 55* 11/24/2011 0610   GFRAA 64* 11/24/2011 0610   CBC    Component Value Date/Time   WBC 5.8 11/23/2011 2114   RBC 6.26* 11/23/2011 2114   HGB 15.2* 11/23/2011 2114   HCT 45.1 11/23/2011 2114   PLT 307 11/23/2011 2114   MCV 72.0* 11/23/2011 2114   MCH 24.3* 11/23/2011 2114   MCHC 33.7 11/23/2011 2114   RDW 15.6* 11/23/2011 2114   LYMPHSABS 2.0 11/23/2011 1529   MONOABS 0.5 11/23/2011 1529   EOSABS 0.0 11/23/2011 1529   BASOSABS 0.0 11/23/2011 1529   CARDIAC ENZYMES Lab Results  Component Value  Date   CKTOTAL 45 11/24/2011   CKMB 2.8 11/24/2011   TROPONINI <0.30 11/24/2011    Assessment/Plan:  Patient Active Hospital Problem List: Acute systolic and diastolic heart failure  DM, II  Hypertension  Anxiety  COPD Hypoalbuminemia  Add albumin to facilitate more diuresis   LOS: 1 day    [name]  MD  11/24/2011, 9:06 AM

## 2011-11-25 ENCOUNTER — Encounter (HOSPITAL_COMMUNITY)
Admission: EM | Disposition: A | Discharge status: Home / Self Care | Payer: Self-pay | Source: Home / Self Care | Attending: Cardiovascular Disease

## 2011-11-25 HISTORY — PX: PERCUTANEOUS CORONARY STENT INTERVENTION (PCI-S): SHX5485

## 2011-11-25 HISTORY — PX: LEFT AND RIGHT HEART CATHETERIZATION WITH CORONARY ANGIOGRAM: SHX5449

## 2011-11-25 LAB — BASIC METABOLIC PANEL
Calcium: 8.6 mg/dL (ref 8.4–10.5)
Creatinine, Ser: 1.19 mg/dL — ABNORMAL HIGH (ref 0.50–1.10)
GFR calc non Af Amer: 49 mL/min — ABNORMAL LOW (ref >90)
Glucose, Bld: 117 mg/dL — ABNORMAL HIGH (ref 70–99)
Sodium: 135 mEq/L (ref 135–145)

## 2011-11-25 LAB — GLUCOSE, CAPILLARY
Glucose-Capillary: 114 mg/dL — ABNORMAL HIGH (ref 70–99)
Glucose-Capillary: 204 mg/dL — ABNORMAL HIGH (ref 70–99)

## 2011-11-25 LAB — POCT I-STAT 3, VENOUS BLOOD GAS (G3P V)
Bicarbonate: 25.4 mEq/L — ABNORMAL HIGH (ref 20.0–24.0)
O2 Saturation: 63 %
TCO2: 27 mmol/L (ref 0–100)

## 2011-11-25 LAB — POCT I-STAT 3, ART BLOOD GAS (G3+): Acid-base deficit: 1 mmol/L (ref 0.0–2.0)

## 2011-11-25 SURGERY — PERCUTANEOUS CORONARY STENT INTERVENTION (PCI-S)
Laterality: Right

## 2011-11-25 MED ORDER — MIDAZOLAM HCL 2 MG/2ML IJ SOLN
INTRAMUSCULAR | Status: AC
  Filled 2011-11-25: qty 2

## 2011-11-25 MED ORDER — LABETALOL HCL 5 MG/ML IV SOLN
INTRAVENOUS | Status: AC
  Filled 2011-11-25: qty 4

## 2011-11-25 MED ORDER — SODIUM CHLORIDE 0.9 % IV SOLN
INTRAVENOUS | Status: DC
  Administered 2011-11-25: 19:00:00 via INTRAVENOUS

## 2011-11-25 MED ORDER — ACETAMINOPHEN 325 MG PO TABS
650.0000 mg | ORAL_TABLET | ORAL | Status: DC | PRN
  Administered 2011-11-25: 325 mg via ORAL
  Filled 2011-11-25: qty 1
  Filled 2011-11-25: qty 2

## 2011-11-25 MED ORDER — ONDANSETRON HCL 4 MG/2ML IJ SOLN: 4.0000 mg | Freq: Four times a day (QID) | INTRAMUSCULAR | Status: DC | PRN

## 2011-11-25 MED ORDER — NITROGLYCERIN 0.2 MG/ML ON CALL CATH LAB
INTRAVENOUS | Status: AC
  Filled 2011-11-25: qty 1

## 2011-11-25 MED ORDER — ASPIRIN 81 MG PO CHEW: 81.0000 mg | CHEWABLE_TABLET | Freq: Every day | ORAL | Status: DC

## 2011-11-25 MED ORDER — TICAGRELOR 90 MG PO TABS
90.0000 mg | ORAL_TABLET | Freq: Two times a day (BID) | ORAL | Status: DC
  Administered 2011-11-25 – 2011-11-29 (×8): 90 mg via ORAL
  Filled 2011-11-25 (×10): qty 1

## 2011-11-25 MED ORDER — BIVALIRUDIN 250 MG IV SOLR
INTRAVENOUS | Status: AC
  Filled 2011-11-25: qty 250

## 2011-11-25 MED ORDER — NITROGLYCERIN IN D5W 200-5 MCG/ML-% IV SOLN
INTRAVENOUS | Status: AC
  Filled 2011-11-25: qty 250

## 2011-11-25 MED ORDER — LIDOCAINE HCL (PF) 1 % IJ SOLN
INTRAMUSCULAR | Status: AC
  Filled 2011-11-25: qty 30

## 2011-11-25 MED ORDER — CARVEDILOL 6.25 MG PO TABS
6.2500 mg | ORAL_TABLET | Freq: Two times a day (BID) | ORAL | Status: DC
  Administered 2011-11-26 – 2011-11-29 (×7): 6.25 mg via ORAL
  Filled 2011-11-25 (×10): qty 1

## 2011-11-25 MED ORDER — LABETALOL HCL 5 MG/ML IV SOLN: 20.0000 mg | Freq: Once | INTRAVENOUS | Status: DC

## 2011-11-25 MED ORDER — ASPIRIN 81 MG PO CHEW
324.0000 mg | CHEWABLE_TABLET | ORAL | Status: AC
  Administered 2011-11-25: 324 mg via ORAL
  Filled 2011-11-25: qty 4

## 2011-11-25 MED ORDER — MIDAZOLAM HCL 2 MG/2ML IJ SOLN: 1.0000 mg | Freq: Once | INTRAMUSCULAR | Status: DC

## 2011-11-25 MED ORDER — SODIUM CHLORIDE 0.9 % IV SOLN: 250.0000 mL | INTRAVENOUS | Status: DC | PRN

## 2011-11-25 MED ORDER — ALPRAZOLAM 0.25 MG PO TABS
0.2500 mg | ORAL_TABLET | Freq: Two times a day (BID) | ORAL | Status: DC | PRN
  Administered 2011-11-25: 0.25 mg via ORAL
  Filled 2011-11-25: qty 1

## 2011-11-25 MED ORDER — MIDAZOLAM BOLUS VIA INFUSION
1.0000 mg | Freq: Once | INTRAVENOUS | Status: DC
  Administered 2011-11-25: 1 mg via INTRAVENOUS

## 2011-11-25 MED ORDER — HEPARIN (PORCINE) IN NACL 2-0.9 UNIT/ML-% IJ SOLN
INTRAMUSCULAR | Status: AC
  Filled 2011-11-25: qty 2000

## 2011-11-25 MED ORDER — INSULIN ASPART 100 UNIT/ML ~~LOC~~ SOLN
0.0000 [IU] | Freq: Three times a day (TID) | SUBCUTANEOUS | Status: DC
Start: 2011-11-26 — End: 2011-11-29
  Administered 2011-11-26: 3 [IU] via SUBCUTANEOUS
  Administered 2011-11-26 – 2011-11-27 (×2): 2 [IU] via SUBCUTANEOUS
  Administered 2011-11-27: 1 [IU] via SUBCUTANEOUS
  Administered 2011-11-27 – 2011-11-28 (×2): 2 [IU] via SUBCUTANEOUS
  Administered 2011-11-28 – 2011-11-29 (×3): 1 [IU] via SUBCUTANEOUS

## 2011-11-25 MED ORDER — SODIUM CHLORIDE 0.9 % IJ SOLN: 3.0000 mL | INTRAMUSCULAR | Status: DC | PRN

## 2011-11-25 MED ORDER — TICAGRELOR 90 MG PO TABS
ORAL_TABLET | ORAL | Status: AC
  Filled 2011-11-25: qty 2

## 2011-11-25 MED ORDER — SODIUM CHLORIDE 0.9 % IJ SOLN: 3.0000 mL | Freq: Two times a day (BID) | INTRAMUSCULAR | Status: DC

## 2011-11-25 MED ORDER — FUROSEMIDE 10 MG/ML IJ SOLN
40.0000 mg | Freq: Every day | INTRAMUSCULAR | Status: DC
  Administered 2011-11-26: 40 mg via INTRAVENOUS
  Filled 2011-11-25: qty 4

## 2011-11-25 MED ORDER — DIAZEPAM 5 MG PO TABS
5.0000 mg | ORAL_TABLET | ORAL | Status: AC
  Administered 2011-11-25: 5 mg via ORAL
  Filled 2011-11-25: qty 1

## 2011-11-25 MED ORDER — FENTANYL CITRATE 0.05 MG/ML IJ SOLN
INTRAMUSCULAR | Status: AC
  Filled 2011-11-25: qty 2

## 2011-11-25 MED ORDER — NITROGLYCERIN IN D5W 200-5 MCG/ML-% IV SOLN
10.0000 ug/min | INTRAVENOUS | Status: DC
  Administered 2011-11-25: 30 ug/min via INTRAVENOUS
  Filled 2011-11-25: qty 250

## 2011-11-25 MED ORDER — CLOPIDOGREL BISULFATE 75 MG PO TABS
75.0000 mg | ORAL_TABLET | ORAL | Status: DC
  Filled 2011-11-25 (×2): qty 1

## 2011-11-25 NOTE — CV Procedure (Signed)
PTCA stenting report dictated on 11/25/2011 dictation number is [id number]

## 2011-11-25 NOTE — Cardiovascular Report (Signed)
[name], [name]               ACCOUNT NO.:  [id number]  MEDICAL RECORD NO.:  [id number]  LOCATION:  [facility]                         FACILITY:  [facility]  PHYSICIAN:  Jermal Dismuke N. Sharyn Lull, M.D. DATE OF BIRTH:  04/04/1951  DATE OF PROCEDURE:  11/25/2011 DATE OF DISCHARGE:                           CARDIAC CATHETERIZATION   PROCEDURE: 1. PTCA to proximal LAD using 2.5 x 8 mm long emerged balloon. 2. Successful deployment of 3.0 x 24 mm long PROMUS element drug-     eluting stent in proximal LAD.  INDICATION FOR THE PROCEDURE:  Ms. Shadduck is a 61 year old female with past medical history significant for hypertension, type 2 diabetes mellitus, COPD, history of systolic heart failure, was admitted by [name] on May 15, because of progressive increasing shortness of breath associated with leg swelling.  The patient was noted to be in congestive heart failure, was treated with IV Lasix with good diuresis and was brought to the cath lab by [name] and subsequently underwent left cardiac cath with selective left and right coronary angiography and right heart catheterization and was noted to have 30-40% distal left main stenosis and 70-75% proximal bifurcation LAD stenosis with diagonal 1.  Diagonal 1 had mild disease, left circumflex system and RCA system has mild disease.  I was called for PCI to proximal LAD.  PROCEDURES: 1. 5-French arterial sheath was exchanged with 6-French arterial     sheath without difficulty. 2. 6-French 3.5 XB LAD guiding catheter was advanced over the wire     under fluoroscopic guidance up to the ascending aorta.  Wire was     pulled out.  The catheter was aspirated and connected to the     Manifold.  Catheter was further advanced and engaged into left     coronary ostium.  A single view of the left system was obtained.     Findings were as above.  INTERVENTIONAL PROCEDURE: 1. Successful PTCA to proximal LAD was done using 2.5 x 8 mm long     emerged  balloon for predilatation.  Two inflations were done. 2. 3.0 x 24 mm long PROMUS element drug-eluting stent was deployed in     proximal LAD at 11 atmospheric pressures.  Two inflations were     done.  The lesion was dilated from 70-75% to 0% residual with     excellent TIMI grade 3 distal flow without evidence of dissection     or distal embolization or compromising off flow to the diagonal 1.     The patient received weight-based Angiomax and 180 mg of Brilinta     prior to the procedure.  The     patient tolerated procedure well.  There were no complications.     The patient was transferred to recovery room in stable condition.  The patient will be further managed by [name].     Eduardo Osier. Sharyn Lull, M.D.     [facility]/MEDQ  D:  11/25/2011  T:  11/25/2011  Job:  [id number]

## 2011-11-25 NOTE — CV Procedure (Signed)
PROCEDURE:  Right and Left heart catheterization with selective coronary angiography, cardiac output and pressure measurements.  CLINICAL HISTORY:  This is a 61 years old black female with shortness of breath has biventricular failure with moderate pulmonary hypertension.  The risks, benefits, and details of the procedure were explained to the patient.  The patient verbalized understanding and wanted to proceed.  Informed written consent was obtained.  PROCEDURE TECHNIQUE:  The patient was approached from the right femoral artery using a 5 French short sheath and right femoral vein using a 7 [place] short sheath. Under fluoroscopy guidance and Swan Ganz wire the Theone Murdoch catheter was placed in right pulmonary artery. PA and wedge pressures were measured, Oxygen saturation was measured followed by cardiac out put measurements. Pull back pressures in RV and RA were measured. Left coronary angiography was done using a Judkins L4 guide catheter.  Right coronary angiography was done using a Judkins R4 guide catheter.  Left ventriculography was not done to conserve dye use.    CONTRAST:  Total of 30 cc.  COMPLICATIONS:  None.  At the end of the procedure a manual device was used for hemostasis.    HEMODYNAMICS:  Aortic pressure was 170/82; LV pressure was 165/8 ; LVEDP 23.  There was no gradient between the left ventricle and aorta. PA-68/21, Wedge-17, RV-67/5, RA-13, O2 sat-PA-63 %, 93 % Ao. C.O-2.5 L/min by thermal and 4 L/min by Fick method  ANGIOGRAM/CORONARY ARTERIOGRAM:   The left main coronary artery has 20-30 % distal narrowing.  The left anterior descending artery has short proximal 80 % stenosis. Smaller caliber Diagonal vessel had moderate diffuse disease  The left circumflex artery has mild proximal disease.  The right coronary artery is dominant and has mild diffuse disease.  LEFT VENTRICULOGRAM:  Left ventricular angiogram was not done. Estimated ejection fraction of 20% by 2-D echo.   LVEDP was 23 mmHg.  IMPRESSION OF HEART CATHETERIZATION:   1. Mild disease of left main coronary artery. 2. Severe disease of left anterior descending artery and its branches. 3. Mild disease of left circumflex artery and its branches. 4. Mild diffuse disease of right coronary artery. 5. Severe left ventricular systolic dysfunction.  LVEDP 23 mmHg.  Ejection fraction 20 %. 6.  Moderate Pulmonary hypertension.  RECOMMENDATION:          PTCA/Stent in LAD.  Signed by Dr. [name] on 11/25/2011.

## 2011-11-25 NOTE — Progress Notes (Signed)
Called cath lab to notify of plavix not given.  Pt left prior to medication coming from pharmacy.  Technician states message to be relayed and given in the cath lab. Thomas Hoff

## 2011-11-25 NOTE — Care Management Note (Addendum)
    Page 1 of 1   11/29/2011     12:07:06 PM   CARE MANAGEMENT NOTE 11/29/2011  Patient:  [name], [name]   Account Number:  [id number]  Date Initiated:  11/25/2011  Documentation initiated by:  Junius Creamer  Subjective/Objective Assessment:   adm w chf     Action/Plan:   lives alone, prob affording meds, pcp dr Shannan Harper   Anticipated DC Date:  11/28/2011   Anticipated DC Plan:  HOME W HOME HEALTH SERVICES      DC Planning Services  CM consult  Medication Assistance      [facility] Choice  HOME HEALTH   Choice offered to / List presented to:  C-1 Patient        HH arranged  HH-10 DISEASE MANAGEMENT      HH agency  [facility]   Status of service:  Completed, signed off Medicare Important Message given?  NO (If response is "NO", the following Medicare IM given date fields will be blank) Date Medicare IM given:   Date Additional Medicare IM given:    Discharge Disposition:  HOME W HOME HEALTH SERVICES  Per UR Regulation:  Reviewed for med. necessity/level of care/duration of stay  If discussed at Long Length of Stay Meetings, dates discussed:    Comments:  11/29/11-1147-J.Zyonna Vardaman,RN,BSN [phone]      [name] signed 34 day medication sheet for CHF meds. Faxed to outpatient pharmacy.Pt to pick up this afternoon. No further needs identified at this time. Patient being discharged home with home care.  11/29/11-1043-J.Evangelia Whitaker,RN,BSN [phone]      In to speak with patient regarding need for medication assistance and home health RN for disease management (CHF). Choice of agencies given to patient. [facility]. Hilda Lias, RN with [facility] notified.  11/28/11 1015 Spoke with pt. and family about medication assistance.  Pt. stated the reason she came into the hospital this time was because she could not afford her medications.  She stated that her [employer] secondary insurance was cancelled due to her inability to pay for it.  Pt. states that she has  applied for Disability x2 and been denied.  This NCM explained to pt. that she could apply for Medicaid.  TC to [employer], [place], to speak with pt. Pt. would qualify for 34 day Heart Failure Medication Assistance program.  Will place prescription for these HF medications on shadow chart for the physician to complete at discharge. NCM to follow Tera Mater, RN, BSN [place] [phone]   11/25/11 16:53p debbie dowell rn,bsn pt just adm to 2900 from different unit. will ask weekend cm to ck on pt.

## 2011-11-25 NOTE — Progress Notes (Signed)
Subjective:  Decreasing shortness of breath. Poor LV and RV function on Echocardiogram.  Objective:  Vital Signs in the last 24 hours: Temp:  [97.5 F (36.4 C)] 97.5 F (36.4 C) (05/17 0500) Pulse Rate:  [62-72] 62  (05/17 0500) Cardiac Rhythm:  [-] Normal sinus rhythm;Other (Comment) (05/16 1955) Resp:  [18] 18  (05/17 0500) BP: (134-152)/(62-93) 150/86 mmHg (05/17 0500) SpO2:  [98 %-100 %] 99 % (05/17 0500) Weight:  [79.33 kg (174 lb 14.3 oz)-79.334 kg (174 lb 14.4 oz)] 79.33 kg (174 lb 14.3 oz) (05/17 0500)  Physical Exam: BP Readings from Last 1 Encounters:  11/25/11 150/86    Wt Readings from Last 1 Encounters:  11/25/11 79.33 kg (174 lb 14.3 oz)    Weight change: 1.406 kg (3 lb 1.6 oz)  HEENT: [place]/AT, Eyes-Brown, PERL, EOMI, Conjunctiva-Pink, Sclera-Non-icteric Neck: No JVD, No bruit, Trachea midline. Lungs:  Clear, Bilateral. Cardiac:  Regular rhythm, normal S1 and S2, no S3. II/VI systolic and diastolic murmur. Abdomen:  Soft, non-tender. Extremities:  No edema present. No cyanosis. No clubbing. CNS: AxOx3, Cranial nerves grossly intact, moves all 4 extremities. Right handed. Skin: Warm and dry.   Intake/Output from previous day: 05/16 0701 - 05/17 0700 In: 240 [P.O.:240] Out: 275 [Urine:275]    Lab Results: BMET    Component Value Date/Time   NA 135 11/25/2011 0545   K 4.2 11/25/2011 0545   CL 99 11/25/2011 0545   CO2 23 11/25/2011 0545   GLUCOSE 117* 11/25/2011 0545   BUN 35* 11/25/2011 0545   CREATININE 1.19* 11/25/2011 0545   CALCIUM 8.6 11/25/2011 0545   GFRNONAA 49* 11/25/2011 0545   GFRAA 57* 11/25/2011 0545   CBC    Component Value Date/Time   WBC 5.8 11/23/2011 2114   RBC 6.26* 11/23/2011 2114   HGB 15.2* 11/23/2011 2114   HCT 45.1 11/23/2011 2114   PLT 307 11/23/2011 2114   MCV 72.0* 11/23/2011 2114   MCH 24.3* 11/23/2011 2114   MCHC 33.7 11/23/2011 2114   RDW 15.6* 11/23/2011 2114   LYMPHSABS 2.0 11/23/2011 1529   MONOABS 0.5 11/23/2011 1529   EOSABS  0.0 11/23/2011 1529   BASOSABS 0.0 11/23/2011 1529   CARDIAC ENZYMES Lab Results  Component Value Date   CKTOTAL 49 11/24/2011   CKMB 2.8 11/24/2011   TROPONINI <0.30 11/24/2011    Assessment/Plan:  Patient Active Hospital Problem List: Systolic and diastolic CHF, acute (11/23/2011) DM, II Hypertension COPD  R + L heart catheterization with coronary angiography. May need Inotropic treatment.   LOS: 2 days    [name]  MD  11/25/2011, 7:13 AM

## 2011-11-26 LAB — CBC
HCT: 46.6 % — ABNORMAL HIGH (ref 36.0–46.0)
Hemoglobin: 15.6 g/dL — ABNORMAL HIGH (ref 12.0–15.0)
MCH: 24.1 pg — ABNORMAL LOW (ref 26.0–34.0)
MCV: 72.1 fL — ABNORMAL LOW (ref 78.0–100.0)
RBC: 6.46 MIL/uL — ABNORMAL HIGH (ref 3.87–5.11)

## 2011-11-26 LAB — BASIC METABOLIC PANEL
CO2: 22 mEq/L (ref 19–32)
Calcium: 8.9 mg/dL (ref 8.4–10.5)
Glucose, Bld: 162 mg/dL — ABNORMAL HIGH (ref 70–99)
Potassium: 4 mEq/L (ref 3.5–5.1)
Sodium: 137 mEq/L (ref 135–145)

## 2011-11-26 LAB — GLUCOSE, CAPILLARY
Glucose-Capillary: 246 mg/dL — ABNORMAL HIGH (ref 70–99)
Glucose-Capillary: 162 mg/dL — ABNORMAL HIGH (ref 70–99)

## 2011-11-26 MED ORDER — FUROSEMIDE 10 MG/ML IJ SOLN
40.0000 mg | Freq: Two times a day (BID) | INTRAMUSCULAR | Status: DC
  Administered 2011-11-26 – 2011-11-29 (×6): 40 mg via INTRAVENOUS
  Filled 2011-11-26 (×9): qty 4

## 2011-11-26 MED ORDER — SPIRONOLACTONE 25 MG PO TABS
25.0000 mg | ORAL_TABLET | Freq: Two times a day (BID) | ORAL | Status: DC
  Administered 2011-11-26 – 2011-11-29 (×6): 25 mg via ORAL
  Filled 2011-11-26 (×9): qty 1

## 2011-11-26 MED ORDER — RAMIPRIL 5 MG PO CAPS
5.0000 mg | ORAL_CAPSULE | Freq: Two times a day (BID) | ORAL | Status: DC
  Administered 2011-11-26 – 2011-11-27 (×3): 5 mg via ORAL
  Filled 2011-11-26 (×5): qty 1

## 2011-11-26 NOTE — Progress Notes (Signed)
Subjective:  Patient denies any chest pain states breathing has improved Patient underwent PTCA stenting to proximal LAD yesterday tolerated procedure well Objective:  Vital Signs in the last 24 hours: Temp:  [97.3 F (36.3 C)-97.5 F (36.4 C)] 97.5 F (36.4 C) (05/18 0805) Pulse Rate:  [62-78] 78  (05/18 0804) Resp:  [16-29] 27  (05/18 0800) BP: (155-175)/(84-112) 172/104 mmHg (05/18 0804) SpO2:  [93 %-100 %] 93 % (05/18 0805) Weight:  [79.5 kg (175 lb 4.3 oz)] 79.5 kg (175 lb 4.3 oz) (05/18 0545)  Intake/Output from previous day: 05/17 0701 - 05/18 0700 In: 662.4 [I.V.:662.4] Out: 1100 [Urine:1100] Intake/Output from this shift:    Physical Exam: Neck: no adenopathy, no carotid bruit, no JVD and supple, symmetrical, trachea midline Lungs: Decreased breath sound at bases with bibasilar Rales Heart: regular rate and rhythm, S1, S2 normal and Soft systolic murmur and diastolic murmur and S3 gallop noted Abdomen: soft, non-tender; bowel sounds normal; no masses,  no organomegaly Extremities: No clubbing cyanosis 2+ edema. Right groin stable  Lab Results:  Basename 11/26/11 0455 11/23/11 2114  WBC 4.5 5.8  HGB 15.6* 15.2*  PLT 343 307    Basename 11/26/11 0455 11/25/11 0545  NA 137 135  K 4.0 4.2  CL 102 99  CO2 22 23  GLUCOSE 162* 117*  BUN 24* 35*  CREATININE 0.88 1.19*    Basename 11/24/11 1122 11/24/11 0610  TROPONINI <0.30 <0.30   Hepatic Function Panel  Basename 11/23/11 1529  PROT 6.7  ALBUMIN 2.3*  AST 14  ALT 10  ALKPHOS 90  BILITOT 0.7  BILIDIR --  IBILI --   No results found for this basename: CHOL in the last 72 hours No results found for this basename: PROTIME in the last 72 hours  Imaging: Imaging results have been reviewed and No results found.  Cardiac Studies:  Assessment/Plan:  Decompensated systolic heart failure Ischemic dilated cardiomyopathy Valvular heart disease Coronary artery disease status post PCI to  LAD Hypertension Diabetes mellitus COPD Hypercholesteremia Plan Add ACE inhibitors and increase diuretics Monitor renal function closely Check labs in a.m.  LOS: 3 days    [name] N 11/26/2011, 10:09 AM

## 2011-11-26 NOTE — Progress Notes (Signed)
.  11/25/2011  Pt insisted that NTG gtt be stopped.  Weaned off over the last 3 hours, Site NSL [employer], [employer]

## 2011-11-26 NOTE — Progress Notes (Signed)
CARDIAC REHAB PHASE I   PRE:  Rate/Rhythm: 66SR  BP:  Supine:   Sitting: 157/95  Standing:    SaO2: 99%RA  MODE:  Ambulation: 230 ft   POST:  Rate/Rhythem: 96SR  BP:  Supine:   Sitting: 159/92  Standing:    SaO2: 99%RA 0925-1030 Pt walked 230 ft on RA with handheld asst. Stopped several times to rest. C/o hips tired. States did not sleep well last night and is very tired. Began ed. Will continue on Monday. Gave stent card and booklet. Pt states she has CHF booklet. To sitting on side of bed after walk. Sats good on RA. Wants to see case manager.  Duanne Limerick

## 2011-11-27 LAB — BASIC METABOLIC PANEL
Calcium: 9.1 mg/dL (ref 8.4–10.5)
Creatinine, Ser: 0.88 mg/dL (ref 0.50–1.10)
GFR calc non Af Amer: 71 mL/min — ABNORMAL LOW (ref >90)
Sodium: 137 mEq/L (ref 135–145)

## 2011-11-27 LAB — GLUCOSE, CAPILLARY
Glucose-Capillary: 162 mg/dL — ABNORMAL HIGH (ref 70–99)
Glucose-Capillary: 171 mg/dL — ABNORMAL HIGH (ref 70–99)

## 2011-11-27 LAB — CBC
Hemoglobin: 15.9 g/dL — ABNORMAL HIGH (ref 12.0–15.0)
MCH: 24.2 pg — ABNORMAL LOW (ref 26.0–34.0)
MCV: 72.3 fL — ABNORMAL LOW (ref 78.0–100.0)
RBC: 6.56 MIL/uL — ABNORMAL HIGH (ref 3.87–5.11)

## 2011-11-27 LAB — PRO B NATRIURETIC PEPTIDE: Pro B Natriuretic peptide (BNP): 10538 pg/mL — ABNORMAL HIGH (ref 0–125)

## 2011-11-27 MED ORDER — RAMIPRIL 10 MG PO CAPS
10.0000 mg | ORAL_CAPSULE | Freq: Two times a day (BID) | ORAL | Status: DC
  Administered 2011-11-27 – 2011-11-29 (×4): 10 mg via ORAL
  Filled 2011-11-27 (×6): qty 1

## 2011-11-27 NOTE — Progress Notes (Signed)
Subjective:  Patient denies any chest pain states breathing has improved leg swelling also slowly improving labs are still pending specimen was hemolyzed and were redrawn  Objective:  Vital Signs in the last 24 hours: Temp:  [97.4 F (36.3 C)-98.2 F (36.8 C)] 98.1 F (36.7 C) (05/19 1115) Pulse Rate:  [63] 63  (05/18 1600) Resp:  [16-22] 16  (05/19 1115) BP: (144-175)/(74-98) 157/87 mmHg (05/19 1115) SpO2:  [98 %-100 %] 100 % (05/19 0512) Weight:  [76.9 kg (169 lb 8.5 oz)] 76.9 kg (169 lb 8.5 oz) (05/19 0217)  Intake/Output from previous day: 05/18 0701 - 05/19 0700 In: 724 [P.O.:720; IV Piggyback:4] Out: 300 [Urine:300] Intake/Output from this shift: Total I/O In: 360 [P.O.:360] Out: -   Physical Exam: Neck: no adenopathy, no carotid bruit, no JVD and supple, symmetrical, trachea midline Lungs: Decreased breath sound at bases with occasional bibasilar Rales air entry improved Heart: regular rate and rhythm, S1, S2 normal and Soft systolic and diastolic murmur noted soft S3 gallop noted Abdomen: soft, non-tender; bowel sounds normal; no masses,  no organomegaly Extremities: No clubbing cyanosis 1+ edema noted  Lab Results:  Basename 11/27/11 0640 11/26/11 0455  WBC 5.4 4.5  HGB 15.9* 15.6*  PLT 318 343    Basename 11/26/11 0455 11/25/11 0545  NA 137 135  K 4.0 4.2  CL 102 99  CO2 22 23  GLUCOSE 162* 117*  BUN 24* 35*  CREATININE 0.88 1.19*   No results found for this basename: TROPONINI:2,CK,MB:2 in the last 72 hours Hepatic Function Panel No results found for this basename: PROT,ALBUMIN,AST,ALT,ALKPHOS,BILITOT,BILIDIR,IBILI in the last 72 hours No results found for this basename: CHOL in the last 72 hours No results found for this basename: PROTIME in the last 72 hours  Imaging: Imaging results have been reviewed and No results found.  Cardiac Studies:  Assessment/Plan:  Decompensated systolic heart failure  Ischemic dilated cardiomyopathy  Valvular  heart disease  Coronary artery disease status post PCI to LAD  Hypertension uncontrolled Diabetes mellitus  COPD  Hypercholesteremia Plan Increase ACE inhibitors as per orders Check labs in a.m. Transfer to telemetry Home soon  LOS: 4 days    [name] N 11/27/2011, 12:54 PM

## 2011-11-28 LAB — CBC
HCT: 43.8 % (ref 36.0–46.0)
Hemoglobin: 14.8 g/dL (ref 12.0–15.0)
MCH: 24.3 pg — ABNORMAL LOW (ref 26.0–34.0)
MCHC: 33.8 g/dL (ref 30.0–36.0)
RDW: 15.8 % — ABNORMAL HIGH (ref 11.5–15.5)

## 2011-11-28 LAB — GLUCOSE, CAPILLARY
Glucose-Capillary: 125 mg/dL — ABNORMAL HIGH (ref 70–99)
Glucose-Capillary: 161 mg/dL — ABNORMAL HIGH (ref 70–99)
Glucose-Capillary: 190 mg/dL — ABNORMAL HIGH (ref 70–99)

## 2011-11-28 LAB — BASIC METABOLIC PANEL
BUN: 20 mg/dL (ref 6–23)
Chloride: 100 mEq/L (ref 96–112)
Creatinine, Ser: 0.86 mg/dL (ref 0.50–1.10)
GFR calc Af Amer: 84 mL/min — ABNORMAL LOW (ref >90)
Glucose, Bld: 117 mg/dL — ABNORMAL HIGH (ref 70–99)

## 2011-11-28 MED FILL — Dextrose Inj 5%: INTRAVENOUS | Qty: 50 | Status: AC

## 2011-11-28 NOTE — Progress Notes (Addendum)
CARDIAC REHAB PHASE I   PRE:  Rate/Rhythm: 66SR  BP:  Supine: 160/80  Sitting:   Standing:    SaO2: 99%RA  MODE:  Ambulation: 440 ft   POST:  Rate/Rhythem: 73  BP:  Supine:   Sitting: 168/86  Standing:    SaO2: 98%RA 0828-0915 Pt walked 440 ft with handheld asst with steady gait. Tolerated well. No DOE noted. Education completed. Printed info on low sodium diet as this is what pt requested. Encouraged pt to resume daily weights as she had stopped them at home. Permission given to refer to [place] Phase 2 program. Gave financial form for program for pt to fill out. Pt would still like to see case manager as she has no insurance.  [name]      [id number] Called case manager for [phone] who will see pt today. [name] DunlapRN

## 2011-11-28 NOTE — ED Provider Notes (Signed)
Medical screening examination/treatment/procedure(s) were conducted as a shared visit with non-physician practitioner(s) and myself.  I personally evaluated the patient during the encounter. CHF exacerbation. Admitted to Cardiology  [name]. Rubin Payor, MD 11/28/11 [phone]

## 2011-11-28 NOTE — Progress Notes (Signed)
Subjective:  No chest pain. Leg edema improving.  Objective:  Vital Signs in the last 24 hours: Temp:  [97.6 F (36.4 C)-98.3 F (36.8 C)] 97.9 F (36.6 C) (05/20 0500) Pulse Rate:  [60-80] 80  (05/20 0500) Cardiac Rhythm:  [-] Normal sinus rhythm (05/19 2106) Resp:  [15-18] 18  (05/20 0500) BP: (142-172)/(82-100) 168/88 mmHg (05/20 0700) SpO2:  [100 %] 100 % (05/20 0500) Weight:  [73.4 kg (161 lb 13.1 oz)-75.3 kg (166 lb 0.1 oz)] 73.4 kg (161 lb 13.1 oz) (05/20 0500)  Physical Exam: BP Readings from Last 1 Encounters:  11/28/11 168/88    Wt Readings from Last 1 Encounters:  11/28/11 73.4 kg (161 lb 13.1 oz)    Weight change: -1.6 kg (-3 lb 8.4 oz)  HEENT: [place]/AT, Eyes-Brown, PERL, EOMI, Conjunctiva-Pink, Sclera-Non-icteric Neck: No JVD, No bruit, Trachea midline. Lungs:  Clear, Bilateral. Cardiac:  Regular rhythm, normal S1 and S2, no S3. III/VI systolic and II/VI diastolic murmur Abdomen:  Soft, non-tender. Extremities:  1 + edema present. No cyanosis. No clubbing. CNS: AxOx3, Cranial nerves grossly intact, moves all 4 extremities. Right handed. Skin: Warm and dry.   Intake/Output from previous day: 05/19 0701 - 05/20 0700 In: 600 [P.O.:600] Out: 1700 [Urine:1700]    Lab Results: BMET    Component Value Date/Time   NA 135 11/28/2011 0530   K 4.2 11/28/2011 0530   CL 100 11/28/2011 0530   CO2 23 11/28/2011 0530   GLUCOSE 117* 11/28/2011 0530   BUN 20 11/28/2011 0530   CREATININE 0.86 11/28/2011 0530   CALCIUM 8.7 11/28/2011 0530   GFRNONAA 73* 11/28/2011 0530   GFRAA 84* 11/28/2011 0530   CBC    Component Value Date/Time   WBC 5.4 11/28/2011 0530   RBC 6.09* 11/28/2011 0530   HGB 14.8 11/28/2011 0530   HCT 43.8 11/28/2011 0530   PLT 301 11/28/2011 0530   MCV 71.9* 11/28/2011 0530   MCH 24.3* 11/28/2011 0530   MCHC 33.8 11/28/2011 0530   RDW 15.8* 11/28/2011 0530   LYMPHSABS 2.0 11/23/2011 1529   MONOABS 0.5 11/23/2011 1529   EOSABS 0.0 11/23/2011 1529   BASOSABS 0.0  11/23/2011 1529   CARDIAC ENZYMES Lab Results  Component Value Date   CKTOTAL 49 11/24/2011   CKMB 2.8 11/24/2011   TROPONINI <0.30 11/24/2011    Assessment/Plan:  Patient Active Hospital Problem List: Decompensated systolic heart failure  Ischemic dilated cardiomyopathy  Valvular heart disease  Coronary artery disease status post PCI to LAD  Hypertension uncontrolled  Diabetes mellitus  COPD  Hypercholesteremia  Continue diuresis as tolerated.   LOS: 5 days    [name]  MD  11/28/2011, 8:38 AM

## 2011-11-29 LAB — GLUCOSE, CAPILLARY

## 2011-11-29 MED ORDER — AMLODIPINE BESYLATE 5 MG PO TABS: 5.0000 mg | ORAL_TABLET | Freq: Every day | ORAL | Status: DC

## 2011-11-29 MED ORDER — FUROSEMIDE 40 MG PO TABS: 80.0000 mg | ORAL_TABLET | Freq: Two times a day (BID) | ORAL | Status: DC

## 2011-11-29 MED ORDER — METOPROLOL TARTRATE 25 MG PO TABS: 25.0000 mg | ORAL_TABLET | Freq: Two times a day (BID) | ORAL | Status: DC

## 2011-11-29 MED ORDER — POTASSIUM CHLORIDE ER 10 MEQ PO TBCR: 10.0000 meq | EXTENDED_RELEASE_TABLET | Freq: Two times a day (BID) | ORAL | Status: DC

## 2011-11-29 MED ORDER — RAMIPRIL 10 MG PO CAPS: 10.0000 mg | ORAL_CAPSULE | Freq: Two times a day (BID) | ORAL | Status: DC

## 2011-11-29 MED ORDER — METOPROLOL TARTRATE 25 MG PO TABS
25.0000 mg | ORAL_TABLET | Freq: Two times a day (BID) | ORAL | Status: DC
  Administered 2011-11-29: 25 mg via ORAL
  Filled 2011-11-29 (×2): qty 1

## 2011-11-29 NOTE — Progress Notes (Signed)
Tele d/c'ed. IV d/c'ed. D/C instructions and medications discussed with pt and pt's family. Stressed importance of daily weights. All questions answered. Pt states understanding. CM contacted to ensure [facility] services are set up before d/c. Pt to be d/c'ed home once [facility] arranged.

## 2011-11-29 NOTE — Discharge Instructions (Signed)
Heart Failure Heart failure happens when the heart muscle does not work normally. This means your heart does not pump blood efficiently for your body to work well. This may cause blood to back up into the lungs or cause your lower legs to swell. Heart failure is a long-term (chronic) condition. It is important for you to take good care of yourself and follow your caregiver's treatment plan.  SYMPTOMS   Shortness of breath with mild exercise or while at rest.   A persistent cough.   Abnormal swelling in the feet and legs.   Unexplained sudden weight gain.   Fatigue and loss of energy.   Feeling lightheaded or close to fainting.   Chest or abdominal pain.  CAUSES   Coronary artery disease.   High blood pressure.   Heart attacks.   Abnormal heart valves.   Lung disease.   Diabetes.  MEDICAL EVALUATION MAY REQUIRE:  Electrocardiogram (EKG).   Chest X-ray.   Blood tests.   Exercise stress test.   Echocardiogram.   Angiogram.   Radionuclide ventriculography.  The symptoms of heart failure can improve with proper treatment. Medications, lifestyle changes, or surgical intervention may be necessary to treat heart failure. Heart medications prescribed include those that lower blood pressure, strengthen contractility, and improve circulation. A diuretic or "water pill" may also be prescribed so excess fluid can be removed from the body. Surgical intervention may include procedures that open blocked arteries or repair damaged heart valves. In some cases, a pacemaker is need to help the heart pump better. Lifestyle changes include quitting smoking, eating a healthy diet, limiting salt intake and limiting alcoholic drinks. Exercise as told by your caregiver.  Salt increases fluid retention and should be avoided. Stay away from food made with a lot of salt. For example, stay away from chips, pretzels, pickles, olives, and canned foods. Eat a 1500 milligram salt (sodium) diet or as told  by your doctor. Weigh yourself each morning after you urinate but before you eat breakfast. Keep a record of your weight to show your caregiver. A sudden weight gain can mean fluid buildup. Tell your caregiver if you gain 3 or more pounds (1.4 kilograms) in a day or 5 pounds (2.3 kilograms) in a week.  Make a list of every medicine, vitamin, or herbal supplement you are taking. Keep the list with you at all times. Show it to your caregiver at every visit. Keep the list up-to-date. Ask your caregiver or pharmacist to write an explanation of each medicine you are taking. SEEK IMMEDIATE MEDICAL CARE IF:  You develop increased breathing, shortness of breath, severe cough or are coughing up blood.   You have severe chest or abdominal pain, or unusual heart palpitations.   You develop weakness or numbness in your face or on one side of the body.  Document Released: 08/04/2004 Document Revised: 06/16/2011 Document Reviewed: 02/07/2008 Exit[employer] Patient Information 2012 [employer]. 

## 2011-11-29 NOTE — Progress Notes (Signed)
Cosign for [employer] RN med admin

## 2011-11-29 NOTE — Discharge Summary (Signed)
Physician Discharge Summary  Patient ID: [name] MRN: [id number] DOB/AGE: Dec 11, 1950 61 y.o.  Admit date: 11/23/2011 Discharge date: 11/29/2011  Admission Diagnoses: Decompensated systolic heart failure  Ischemic dilated cardiomyopathy  Valvular heart disease  Coronary artery disease status post PCI to LAD  Hypertension uncontrolled  Diabetes mellitus  COPD  Hypercholesteremia  Discharge Diagnoses:  Principal Problem:  *Systolic and diastolic CHF, acute Ischemic dilated cardiomyopathy  Valvular heart disease  Coronary artery disease status post PCI to LAD  Hypertension uncontrolled  Diabetes mellitus  COPD  Hypercholesteremia   Discharged Condition: good  Hospital Course: 60 years old black female presented with leg edema and shortness of breath. She underwent cardiac cath on 11/25/2011 that showed Significant LAD disease. Post stent placement in LAD she had additional diuresis with IV lasix. On 11/29/2011 she was discharged home in satisfactory condition with follow up in 1 week.  Consults: cardiology  Significant Diagnostic Studies: labs: Normal CBC and BMET.   Chest x-ray: 1. Cardiomegaly with pulmonary venous congestion (no frank pulmonary edema at this time).                      2. Atherosclerosis  Echocardiogram: Poor LV and RV function.  Cardiac cath with severe proximal LAD disease with stent placement.  Treatments: cardiac meds: ramipril (Altace), carvedilol, digoxin and furosemide and procedures: R + L heart cath + LAD angioplasty.  Discharge Exam: Blood pressure 107/64, pulse 98, temperature 98.9 F (37.2 C), temperature source Oral, resp. rate 20, height 5\' 5"  (1.651 m), weight 70.988 kg (156 lb 8 oz), SpO2 98.00%. HEENT: [place]/AT, Ave built and nourished. [employer], PERL, EOMI.  Neck: + JVD, no bruit, no thyromegaly.  Lungs: Bilateral basal crackles.  Heart: Normal S1 and S2. Grade III/VI systolic murmur LSB  Abdomen: Mild swelling, non-tender.    Ext: 2 + edema up to knees, bilaterally.  CNS: Cranial nerves grossly intact.  Skin: Warm and dry.   Disposition: 01-Home or Self Care  Discharge Orders    Future Orders Please Complete By Expires   Amb Referral to Cardiac Rehabilitation        Medication List  As of 11/29/2011  9:15 AM   STOP taking these medications         cloNIDine 0.1 MG tablet         TAKE these medications         amLODipine 5 MG tablet   Commonly known as: NORVASC   Take 1 tablet (5 mg total) by mouth daily.      aspirin EC 81 MG tablet   Take 81 mg by mouth daily.      digoxin 0.125 MG tablet   Commonly known as: LANOXIN   Take 125 mcg by mouth daily.      furosemide 40 MG tablet   Commonly known as: LASIX   Take 2 tablets (80 mg total) by mouth 2 (two) times daily.      metFORMIN 500 MG tablet   Commonly known as: GLUCOPHAGE   Take 500 mg by mouth 2 (two) times daily with a meal.      potassium chloride 10 MEQ tablet   Commonly known as: K-DUR   Take 1 tablet (10 mEq total) by mouth 2 (two) times daily. 11/21/11 last dose.      ramipril 10 MG capsule   Commonly known as: ALTACE   Take 1 capsule (10 mg total) by mouth 2 (two) times daily.  spironolactone 25 MG tablet   Commonly known as: ALDACTONE   Take 25 mg by mouth daily.           Follow-up Information    Follow up with [facility], MD. Schedule an appointment as soon as possible for a visit in 1 week.   Contact information:   [address] [place] [address] [phone]          Signed: [name] 11/29/2011, 9:15 AM

## 2011-12-30 ENCOUNTER — Encounter (INDEPENDENT_AMBULATORY_CARE_PROVIDER_SITE_OTHER): Payer: Self-pay | Admitting: Ophthalmology

## 2011-12-30 DIAGNOSIS — H3581 Retinal edema: Secondary | ICD-10-CM

## 2011-12-30 DIAGNOSIS — H35039 Hypertensive retinopathy, unspecified eye: Secondary | ICD-10-CM

## 2011-12-30 DIAGNOSIS — E11359 Type 2 diabetes mellitus with proliferative diabetic retinopathy without macular edema: Secondary | ICD-10-CM

## 2011-12-30 DIAGNOSIS — I10 Essential (primary) hypertension: Secondary | ICD-10-CM

## 2011-12-30 DIAGNOSIS — H43819 Vitreous degeneration, unspecified eye: Secondary | ICD-10-CM

## 2011-12-30 DIAGNOSIS — E1165 Type 2 diabetes mellitus with hyperglycemia: Secondary | ICD-10-CM

## 2011-12-30 DIAGNOSIS — E1139 Type 2 diabetes mellitus with other diabetic ophthalmic complication: Secondary | ICD-10-CM

## 2011-12-30 DIAGNOSIS — H2510 Age-related nuclear cataract, unspecified eye: Secondary | ICD-10-CM

## 2012-01-02 ENCOUNTER — Encounter (INDEPENDENT_AMBULATORY_CARE_PROVIDER_SITE_OTHER): Payer: Self-pay | Admitting: Ophthalmology

## 2012-01-02 DIAGNOSIS — E11311 Type 2 diabetes mellitus with unspecified diabetic retinopathy with macular edema: Secondary | ICD-10-CM

## 2012-01-02 DIAGNOSIS — E1165 Type 2 diabetes mellitus with hyperglycemia: Secondary | ICD-10-CM

## 2012-01-06 ENCOUNTER — Ambulatory Visit (INDEPENDENT_AMBULATORY_CARE_PROVIDER_SITE_OTHER): Payer: Self-pay | Admitting: Ophthalmology

## 2012-01-06 DIAGNOSIS — E11319 Type 2 diabetes mellitus with unspecified diabetic retinopathy without macular edema: Secondary | ICD-10-CM

## 2012-01-06 DIAGNOSIS — H43819 Vitreous degeneration, unspecified eye: Secondary | ICD-10-CM

## 2012-01-06 DIAGNOSIS — H35039 Hypertensive retinopathy, unspecified eye: Secondary | ICD-10-CM

## 2012-01-06 DIAGNOSIS — I10 Essential (primary) hypertension: Secondary | ICD-10-CM

## 2012-01-06 DIAGNOSIS — E11311 Type 2 diabetes mellitus with unspecified diabetic retinopathy with macular edema: Secondary | ICD-10-CM

## 2012-01-06 DIAGNOSIS — E1139 Type 2 diabetes mellitus with other diabetic ophthalmic complication: Secondary | ICD-10-CM

## 2012-01-10 ENCOUNTER — Encounter (INDEPENDENT_AMBULATORY_CARE_PROVIDER_SITE_OTHER): Payer: Self-pay | Admitting: Ophthalmology

## 2012-01-10 DIAGNOSIS — E11319 Type 2 diabetes mellitus with unspecified diabetic retinopathy without macular edema: Secondary | ICD-10-CM

## 2012-01-10 DIAGNOSIS — I10 Essential (primary) hypertension: Secondary | ICD-10-CM

## 2012-01-10 DIAGNOSIS — H43819 Vitreous degeneration, unspecified eye: Secondary | ICD-10-CM

## 2012-01-10 DIAGNOSIS — E11311 Type 2 diabetes mellitus with unspecified diabetic retinopathy with macular edema: Secondary | ICD-10-CM

## 2012-01-10 DIAGNOSIS — E1139 Type 2 diabetes mellitus with other diabetic ophthalmic complication: Secondary | ICD-10-CM

## 2012-01-10 DIAGNOSIS — H35039 Hypertensive retinopathy, unspecified eye: Secondary | ICD-10-CM

## 2012-01-23 ENCOUNTER — Encounter (INDEPENDENT_AMBULATORY_CARE_PROVIDER_SITE_OTHER): Payer: Self-pay | Admitting: Ophthalmology

## 2012-01-25 ENCOUNTER — Encounter (INDEPENDENT_AMBULATORY_CARE_PROVIDER_SITE_OTHER): Payer: Self-pay | Admitting: Ophthalmology

## 2012-02-10 ENCOUNTER — Encounter (INDEPENDENT_AMBULATORY_CARE_PROVIDER_SITE_OTHER): Payer: Self-pay | Admitting: Ophthalmology

## 2012-02-15 ENCOUNTER — Encounter (INDEPENDENT_AMBULATORY_CARE_PROVIDER_SITE_OTHER): Payer: Self-pay | Admitting: Ophthalmology

## 2012-02-22 ENCOUNTER — Encounter (INDEPENDENT_AMBULATORY_CARE_PROVIDER_SITE_OTHER): Payer: Self-pay | Admitting: Ophthalmology

## 2012-03-07 ENCOUNTER — Encounter (INDEPENDENT_AMBULATORY_CARE_PROVIDER_SITE_OTHER): Payer: Self-pay | Admitting: Ophthalmology

## 2012-03-14 ENCOUNTER — Encounter (INDEPENDENT_AMBULATORY_CARE_PROVIDER_SITE_OTHER): Payer: Self-pay | Admitting: Ophthalmology

## 2012-03-15 ENCOUNTER — Encounter (INDEPENDENT_AMBULATORY_CARE_PROVIDER_SITE_OTHER): Payer: Self-pay | Admitting: Ophthalmology

## 2012-07-11 HISTORY — PX: CARDIAC CATHETERIZATION: SHX172

## 2012-09-05 ENCOUNTER — Encounter (HOSPITAL_COMMUNITY): Payer: Self-pay | Admitting: *Deleted

## 2012-09-05 ENCOUNTER — Emergency Department (HOSPITAL_COMMUNITY): Payer: No Typology Code available for payment source

## 2012-09-05 ENCOUNTER — Inpatient Hospital Stay (HOSPITAL_COMMUNITY)
Admission: EM | Admit: 2012-09-05 | Discharge: 2012-09-10 | DRG: 280 | Disposition: A | Discharge status: Home / Self Care | Payer: No Typology Code available for payment source | Attending: Cardiovascular Disease | Admitting: Cardiovascular Disease

## 2012-09-05 DIAGNOSIS — I2589 Other forms of chronic ischemic heart disease: Secondary | ICD-10-CM | POA: Diagnosis present

## 2012-09-05 DIAGNOSIS — Z794 Long term (current) use of insulin: Secondary | ICD-10-CM

## 2012-09-05 DIAGNOSIS — E876 Hypokalemia: Secondary | ICD-10-CM | POA: Diagnosis present

## 2012-09-05 DIAGNOSIS — Z79899 Other long term (current) drug therapy: Secondary | ICD-10-CM

## 2012-09-05 DIAGNOSIS — F411 Generalized anxiety disorder: Secondary | ICD-10-CM | POA: Diagnosis present

## 2012-09-05 DIAGNOSIS — I079 Rheumatic tricuspid valve disease, unspecified: Secondary | ICD-10-CM | POA: Diagnosis present

## 2012-09-05 DIAGNOSIS — I319 Disease of pericardium, unspecified: Secondary | ICD-10-CM | POA: Diagnosis present

## 2012-09-05 DIAGNOSIS — Z9861 Coronary angioplasty status: Secondary | ICD-10-CM

## 2012-09-05 DIAGNOSIS — N182 Chronic kidney disease, stage 2 (mild): Secondary | ICD-10-CM | POA: Diagnosis present

## 2012-09-05 DIAGNOSIS — I2119 ST elevation (STEMI) myocardial infarction involving other coronary artery of inferior wall: Principal | ICD-10-CM | POA: Diagnosis present

## 2012-09-05 DIAGNOSIS — I379 Nonrheumatic pulmonary valve disorder, unspecified: Secondary | ICD-10-CM | POA: Diagnosis present

## 2012-09-05 DIAGNOSIS — I2789 Other specified pulmonary heart diseases: Secondary | ICD-10-CM | POA: Diagnosis present

## 2012-09-05 DIAGNOSIS — R0789 Other chest pain: Secondary | ICD-10-CM | POA: Diagnosis present

## 2012-09-05 DIAGNOSIS — I5043 Acute on chronic combined systolic (congestive) and diastolic (congestive) heart failure: Secondary | ICD-10-CM | POA: Diagnosis present

## 2012-09-05 DIAGNOSIS — I10 Essential (primary) hypertension: Secondary | ICD-10-CM

## 2012-09-05 DIAGNOSIS — I129 Hypertensive chronic kidney disease with stage 1 through stage 4 chronic kidney disease, or unspecified chronic kidney disease: Secondary | ICD-10-CM | POA: Diagnosis present

## 2012-09-05 DIAGNOSIS — I2510 Atherosclerotic heart disease of native coronary artery without angina pectoris: Secondary | ICD-10-CM | POA: Diagnosis present

## 2012-09-05 DIAGNOSIS — I080 Rheumatic disorders of both mitral and aortic valves: Secondary | ICD-10-CM | POA: Diagnosis present

## 2012-09-05 DIAGNOSIS — I509 Heart failure, unspecified: Secondary | ICD-10-CM | POA: Diagnosis present

## 2012-09-05 DIAGNOSIS — I2582 Chronic total occlusion of coronary artery: Secondary | ICD-10-CM | POA: Diagnosis present

## 2012-09-05 DIAGNOSIS — E119 Type 2 diabetes mellitus without complications: Secondary | ICD-10-CM | POA: Diagnosis present

## 2012-09-05 HISTORY — DX: Atherosclerotic heart disease of native coronary artery without angina pectoris: I25.10

## 2012-09-05 LAB — POCT I-STAT, CHEM 8
BUN: 30 mg/dL — ABNORMAL HIGH (ref 6–23)
Calcium, Ion: 1.08 mmol/L — ABNORMAL LOW (ref 1.13–1.30)
Chloride: 101 mEq/L (ref 96–112)
Creatinine, Ser: 1.4 mg/dL — ABNORMAL HIGH (ref 0.50–1.10)
Glucose, Bld: 298 mg/dL — ABNORMAL HIGH (ref 70–99)
HCT: 49 % — ABNORMAL HIGH (ref 36.0–46.0)
Hemoglobin: 16.7 g/dL — ABNORMAL HIGH (ref 12.0–15.0)
Potassium: 3.7 mEq/L (ref 3.5–5.1)
Sodium: 137 mEq/L (ref 135–145)
TCO2: 26 mmol/L (ref 0–100)

## 2012-09-05 LAB — CBC
HCT: 44.3 % (ref 36.0–46.0)
HCT: 43.1 % (ref 36.0–46.0)
Hemoglobin: 14.7 g/dL (ref 12.0–15.0)
Hemoglobin: 14.6 g/dL (ref 12.0–15.0)
MCH: 25 pg — ABNORMAL LOW (ref 26.0–34.0)
MCH: 25.7 pg — ABNORMAL LOW (ref 26.0–34.0)
MCHC: 33.2 g/dL (ref 30.0–36.0)
MCHC: 33.9 g/dL (ref 30.0–36.0)
MCV: 75.5 fL — ABNORMAL LOW (ref 78.0–100.0)
Platelets: 318 10*3/uL (ref 150–400)
RBC: 5.87 MIL/uL — ABNORMAL HIGH (ref 3.87–5.11)
RBC: 5.67 MIL/uL — ABNORMAL HIGH (ref 3.87–5.11)
RDW: 15.7 % — ABNORMAL HIGH (ref 11.5–15.5)
WBC: 6.8 10*3/uL (ref 4.0–10.5)

## 2012-09-05 LAB — POCT I-STAT TROPONIN I: Troponin i, poc: 0.12 ng/mL (ref 0.00–0.08)

## 2012-09-05 LAB — CREATININE, SERUM
Creatinine, Ser: 1.2 mg/dL — ABNORMAL HIGH (ref 0.50–1.10)
GFR calc non Af Amer: 48 mL/min — ABNORMAL LOW (ref >90)

## 2012-09-05 LAB — GLUCOSE, CAPILLARY: Glucose-Capillary: 210 mg/dL — ABNORMAL HIGH (ref 70–99)

## 2012-09-05 LAB — PRO B NATRIURETIC PEPTIDE: Pro B Natriuretic peptide (BNP): 12171 pg/mL — ABNORMAL HIGH (ref 0–125)

## 2012-09-05 LAB — MRSA PCR SCREENING: MRSA by PCR: NEGATIVE

## 2012-09-05 MED ORDER — SODIUM CHLORIDE 0.9 % IJ SOLN
3.0000 mL | Freq: Two times a day (BID) | INTRAMUSCULAR | Status: DC
  Administered 2012-09-05 – 2012-09-09 (×6): 3 mL via INTRAVENOUS
  Administered 2012-09-10: 09:00:00 via INTRAVENOUS

## 2012-09-05 MED ORDER — SODIUM CHLORIDE 0.9 % IV SOLN: 250.0000 mL | INTRAVENOUS | Status: DC | PRN

## 2012-09-05 MED ORDER — TICAGRELOR 90 MG PO TABS
90.0000 mg | ORAL_TABLET | Freq: Two times a day (BID) | ORAL | Status: DC
  Administered 2012-09-05 – 2012-09-10 (×10): 90 mg via ORAL
  Filled 2012-09-05 (×11): qty 1

## 2012-09-05 MED ORDER — INSULIN GLARGINE 100 UNIT/ML ~~LOC~~ SOLN
10.0000 [IU] | Freq: Every day | SUBCUTANEOUS | Status: DC
  Administered 2012-09-06 – 2012-09-09 (×5): 10 [IU] via SUBCUTANEOUS

## 2012-09-05 MED ORDER — NITROGLYCERIN IN D5W 200-5 MCG/ML-% IV SOLN
2.0000 ug/min | INTRAVENOUS | Status: DC
  Administered 2012-09-05: 5 ug/min via INTRAVENOUS
  Administered 2012-09-06: 200 ug/min via INTRAVENOUS
  Administered 2012-09-06: 190 ug/min via INTRAVENOUS
  Administered 2012-09-06: 120 ug/min via INTRAVENOUS
  Administered 2012-09-07: 100 ug/min via INTRAVENOUS
  Administered 2012-09-08: 90 ug/min via INTRAVENOUS
  Filled 2012-09-05 (×8): qty 250

## 2012-09-05 MED ORDER — AMLODIPINE BESYLATE 5 MG PO TABS
5.0000 mg | ORAL_TABLET | Freq: Every day | ORAL | Status: DC
  Administered 2012-09-05 – 2012-09-10 (×6): 5 mg via ORAL
  Filled 2012-09-05 (×7): qty 1

## 2012-09-05 MED ORDER — ALPRAZOLAM 0.25 MG PO TABS
0.2500 mg | ORAL_TABLET | Freq: Two times a day (BID) | ORAL | Status: DC | PRN
  Administered 2012-09-07 (×2): 0.25 mg via ORAL
  Filled 2012-09-05 (×2): qty 1

## 2012-09-05 MED ORDER — GATIFLOXACIN 0.5 % OP SOLN
1.0000 [drp] | Freq: Four times a day (QID) | OPHTHALMIC | Status: AC
  Administered 2012-09-05 – 2012-09-06 (×2): 1 [drp] via OPHTHALMIC
  Filled 2012-09-05: qty 2.5

## 2012-09-05 MED ORDER — SODIUM CHLORIDE 0.9 % IJ SOLN: 3.0000 mL | INTRAMUSCULAR | Status: DC | PRN

## 2012-09-05 MED ORDER — PANTOPRAZOLE SODIUM 40 MG PO TBEC
40.0000 mg | DELAYED_RELEASE_TABLET | Freq: Every day | ORAL | Status: DC
  Administered 2012-09-05 – 2012-09-10 (×6): 40 mg via ORAL
  Filled 2012-09-05 (×6): qty 1

## 2012-09-05 MED ORDER — ONDANSETRON HCL 4 MG/2ML IJ SOLN: 4.0000 mg | Freq: Four times a day (QID) | INTRAMUSCULAR | Status: DC | PRN

## 2012-09-05 MED ORDER — ASPIRIN EC 81 MG PO TBEC
81.0000 mg | DELAYED_RELEASE_TABLET | Freq: Every day | ORAL | Status: DC
  Administered 2012-09-06 – 2012-09-10 (×5): 81 mg via ORAL
  Filled 2012-09-05 (×6): qty 1

## 2012-09-05 MED ORDER — ACETAMINOPHEN 325 MG PO TABS
650.0000 mg | ORAL_TABLET | ORAL | Status: DC | PRN
  Administered 2012-09-07: 650 mg via ORAL
  Filled 2012-09-05: qty 2

## 2012-09-05 MED ORDER — POTASSIUM CHLORIDE CRYS ER 20 MEQ PO TBCR
20.0000 meq | EXTENDED_RELEASE_TABLET | Freq: Two times a day (BID) | ORAL | Status: AC
  Administered 2012-09-05 – 2012-09-07 (×4): 20 meq via ORAL
  Filled 2012-09-05 (×4): qty 1

## 2012-09-05 MED ORDER — POTASSIUM CHLORIDE CRYS ER 20 MEQ PO TBCR
40.0000 meq | EXTENDED_RELEASE_TABLET | Freq: Once | ORAL | Status: AC
  Administered 2012-09-05: 40 meq via ORAL
  Filled 2012-09-05: qty 2

## 2012-09-05 MED ORDER — FUROSEMIDE 10 MG/ML IJ SOLN
40.0000 mg | Freq: Two times a day (BID) | INTRAMUSCULAR | Status: DC
  Administered 2012-09-05 – 2012-09-09 (×7): 40 mg via INTRAVENOUS
  Filled 2012-09-05 (×11): qty 4

## 2012-09-05 MED ORDER — FUROSEMIDE 10 MG/ML IJ SOLN
40.0000 mg | Freq: Once | INTRAMUSCULAR | Status: AC
  Administered 2012-09-05: 40 mg via INTRAVENOUS
  Filled 2012-09-05: qty 4

## 2012-09-05 MED ORDER — HEPARIN SODIUM (PORCINE) 5000 UNIT/ML IJ SOLN
5000.0000 [IU] | Freq: Three times a day (TID) | INTRAMUSCULAR | Status: DC
  Administered 2012-09-05 – 2012-09-06 (×4): 5000 [IU] via SUBCUTANEOUS
  Filled 2012-09-05 (×5): qty 1

## 2012-09-05 MED ORDER — NITROGLYCERIN 0.4 MG SL SUBL
0.4000 mg | SUBLINGUAL_TABLET | SUBLINGUAL | Status: DC | PRN
Start: 2012-09-05 — End: 2012-09-10

## 2012-09-05 MED ORDER — CLONIDINE HCL 0.2 MG PO TABS
0.2000 mg | ORAL_TABLET | Freq: Two times a day (BID) | ORAL | Status: DC
  Administered 2012-09-05 – 2012-09-09 (×7): 0.2 mg via ORAL
  Filled 2012-09-05 (×10): qty 1

## 2012-09-05 MED ORDER — AMLODIPINE BESYLATE 5 MG PO TABS: 5.0000 mg | ORAL_TABLET | Freq: Every day | ORAL | Status: DC

## 2012-09-05 MED ORDER — INSULIN ASPART 100 UNIT/ML ~~LOC~~ SOLN
4.0000 [IU] | Freq: Three times a day (TID) | SUBCUTANEOUS | Status: DC
  Administered 2012-09-06 – 2012-09-09 (×9): 4 [IU] via SUBCUTANEOUS

## 2012-09-05 MED ORDER — ASPIRIN 81 MG PO CHEW
324.0000 mg | CHEWABLE_TABLET | Freq: Once | ORAL | Status: AC
  Administered 2012-09-05: 324 mg via ORAL
  Filled 2012-09-05: qty 4

## 2012-09-05 NOTE — Progress Notes (Signed)
CRITICAL VALUE ALERT  Critical value received:  Troponin    Date of notification:  09/05/12  Time of notification:  1045  Critical value read back:yes  Nurse who received alert:  Sherry Ruffing   MD notified (1st page):  Algie Coffer  Time of first page:  1048  MD notified (2nd page):  Time of second page:  Responding MD:  Algie Coffer  Time MD responded:  1057

## 2012-09-05 NOTE — ED Notes (Addendum)
Pt with hx of chf to ED c/o increased sob and LE edema x 6 days.  Pt with labored breathing while in wc.  Denies chest pain.  LE edema present.  Pt last took lasix this am.

## 2012-09-05 NOTE — H&P (Signed)
[name] is an 62 y.o. female.   Chief Complaint: Leg edema and shortness of breath HPI: 62 years old female with DM, II and dilated ischemic cardiomyopathy has progressive leg edema and shortness of breath x 6 days. She ran out of medications x few weeks and recently bought insurance 3 weeks ago. No fever. No cough.  Past Medical History  Diagnosis Date  . Hypertension   . Diabetes mellitus   . Heart failure   . Coronary artery disease   . Stented coronary artery       History reviewed. No pertinent past surgical history.  No family history on file. Social History:  reports that she has never smoked. She does not have any smokeless tobacco history on file. She reports that she does not drink alcohol or use illicit drugs.  Allergies: No Known Allergies   (Not in a hospital admission)  Results for orders placed during the hospital encounter of 09/05/12 (from the past 48 hour([name]))  CBC     Status: Abnormal   Collection Time    09/05/12  4:51 PM      Result Value Range   WBC 6.8  4.0 - 10.5 K/uL   RBC 5.87 (*) 3.87 - 5.11 MIL/uL   Hemoglobin 14.7  12.0 - 15.0 g/dL   HCT 16.1  09.6 - 04.5 %   MCV 75.5 (*) 78.0 - 100.0 fL   MCH 25.0 (*) 26.0 - 34.0 pg   MCHC 33.2  30.0 - 36.0 g/dL   RDW 40.9 (*) 81.1 - 91.4 %   Platelets 318  150 - 400 K/uL  PRO B NATRIURETIC PEPTIDE     Status: Abnormal   Collection Time    09/05/12  4:51 PM      Result Value Range   Pro B Natriuretic peptide (BNP) 12171.0 (*) 0 - 125 pg/mL  POCT I-STAT TROPONIN I     Status: Abnormal   Collection Time    09/05/12  5:12 PM      Result Value Range   Troponin i, poc 0.12 (*) 0.00 - 0.08 ng/mL   Comment NOTIFIED PHYSICIAN     Comment 3            Comment: Due to the release kinetics of cTnI,     a negative result within the first hours     of the onset of symptoms does not rule out     myocardial infarction with certainty.     If myocardial infarction is still suspected,     repeat the test at  appropriate intervals.  POCT I-STAT, CHEM 8     Status: Abnormal   Collection Time    09/05/12  5:14 PM      Result Value Range   Sodium 137  135 - 145 mEq/L   Potassium 3.7  3.5 - 5.1 mEq/L   Chloride 101  96 - 112 mEq/L   BUN 30 (*) 6 - 23 mg/dL   Creatinine, Ser 7.82 (*) 0.50 - 1.10 mg/dL   Glucose, Bld 956 (*) 70 - 99 mg/dL   Calcium, Ion 2.13 (*) 1.13 - 1.30 mmol/L   TCO2 26  0 - 100 mmol/L   Hemoglobin 16.7 (*) 12.0 - 15.0 g/dL   HCT 08.6 (*) 57.8 - 46.9 %   Dg Chest 2 View  09/05/2012  *RADIOLOGY REPORT*  Clinical Data: Shortness of breath.  Cough.  CHEST - 2 VIEW  Comparison: Two-view chest 11/23/2011.  Findings: Cardiac  enlargement is stable.  Mild pulmonary vascular congestion is similar to the prior study.  Mild bibasilar atelectasis is worse on the left.  There are no definite effusions. Mild degenerative changes in the thoracic spine are stable.  IMPRESSION:  1.  Stable cardiomegaly and similar pattern of mild pulmonary vascular congestion without frank edema. 2. No focal airspace disease.   Original Report Authenticated By: Marin Roberts, M.D.     @ROS @ + wears glasses, + weight gain + partial dentures, + COPD, + angina, + leg edema, + hypertension, + DM, II, + CHF, No CVA, No seizures, No kidney stone, + arthritis.  Physical Exam:  Blood pressure 195/121, pulse 115, temperature 98.4 F (36.9 C), temperature source Oral, resp. rate 28, height 5\' 4"  (1.626 m), weight 64.864 kg (143 lb), SpO2 100.00%.   HEENT: [place]/AT, Ave built and nourished. [employer], PERL, EOMI.  Neck: + JVD, no bruit, no thyromegaly.  Lungs: Bilateral basal crackles.  Heart: Normal S1 and S2. Grade III/VI systolic murmur LSB  Abdomen: Mild swelling, non-tender.  Ext: 2 + edema up to knees, bilaterally.  CNS: Cranial nerves grossly intact.  Skin: Warm and dry.   Assessment/Plan Acute systolic and diastolic heart failure  DM, II  Hypertension  Anxiety  IV Lasix/IV NTG/Home  medications.  [name] 09/05/2012, 6:59 PM

## 2012-09-05 NOTE — ED Provider Notes (Signed)
History    [name]-year-old female with shortness of breath. Gradual onset on Monday and progressively worsening since then. Patient denies any pain. Occasional cough. Nonproductive. No fevers or chills. Shortness of breath is constant and worse with exertion. She has not noticed a positional component. She endorses worsening lower extremity edema. Has a known history of coronary artery disease with placement of a drug-eluting stent in turn LAD in May of last year. Patient's cardiologist is [name]. It sounds like patient is only intermittently compliant with her medications.   CSN: [id number]  Arrival date & time 09/05/12  1638   First MD Initiated Contact with Patient 09/05/12 1712      Chief Complaint  Patient presents with  . Shortness of Breath    (Consider location/radiation/quality/duration/timing/severity/associated sxs/prior treatment) HPI  Past Medical History  Diagnosis Date  . Hypertension   . Diabetes mellitus   . Heart failure   . Coronary artery disease   . Stented coronary artery     History reviewed. No pertinent past surgical history.  No family history on file.  History  Substance Use Topics  . Smoking status: Never Smoker   . Smokeless tobacco: Not on file  . Alcohol Use: No    OB History   Grav Para Term Preterm Abortions TAB SAB Ect Mult Living                  Review of Systems  All systems reviewed and negative, other than as noted in HPI.   Allergies  Review of patient's allergies indicates no known allergies.  Home Medications   Current Outpatient Rx  Name  Route  Sig  Dispense  Refill  . amLODipine (NORVASC) 5 MG tablet   Oral   Take 1 tablet (5 mg total) by mouth daily.         Marland Kitchen aspirin EC 81 MG tablet   Oral   Take 81 mg by mouth daily.         . cloNIDine (CATAPRES) 0.2 MG tablet   Oral   Take 0.2 mg by mouth 2 (two) times daily.         . furosemide (LASIX) 40 MG tablet   Oral   Take 2 tablets (80 mg total)  by mouth 2 (two) times daily.   120 tablet   1   . metFORMIN (GLUCOPHAGE) 500 MG tablet   Oral   Take 500 mg by mouth 2 (two) times daily with a meal.         . moxifloxacin (VIGAMOX) 0.5 % ophthalmic solution   Left Eye   Place 1 drop into the left eye 4 (four) times daily.         . Multiple Vitamin (MULTIVITAMIN WITH MINERALS) TABS   Oral   Take 1 tablet by mouth daily.         Marland Kitchen omeprazole (PRILOSEC) 20 MG capsule   Oral   Take 20 mg by mouth daily.         . potassium chloride (K-DUR) 10 MEQ tablet   Oral   Take 1 tablet (10 mEq total) by mouth 2 (two) times daily. 11/21/11 last dose.   60 tablet   1   . Ticagrelor (BRILINTA) 90 MG TABS tablet   Oral   Take 90 mg by mouth 2 (two) times daily.           BP 213/129  Pulse 131  Temp(Src) 98.4 F (36.9 C) (Oral)  Resp 32  Ht 5\' 4"  (1.626 m)  Wt 143 lb (64.864 kg)  BMI 24.53 kg/m2  SpO2 98%  Physical Exam  Nursing note and vitals reviewed. Constitutional: She appears well-developed and well-nourished. No distress.  HENT:  Head: Normocephalic and atraumatic.  Eyes: Conjunctivae are normal. Right eye exhibits no discharge. Left eye exhibits no discharge.  Neck: Neck supple.  Cardiovascular: Regular rhythm and normal heart sounds.  Exam reveals no gallop and no friction rub.   No murmur heard. Tachycardic with a regular rhythm. I cannot appreciate a murmur.  Pulmonary/Chest:  Tachypnea. Crackles at bilateral bases, right worse than left  Abdominal: Soft. She exhibits no distension. There is no tenderness.  Musculoskeletal: She exhibits no edema and no tenderness.  Symmetric pitting lower extremity edema. No calf tenderness. Negative Homans.  Neurological: She is alert.  Skin: Skin is warm and dry.  Psychiatric: She has a normal mood and affect. Her behavior is normal. Thought content normal.    ED Course  Procedures (including critical care time)  Labs Reviewed  CBC - Abnormal; Notable for the  following:    RBC 5.87 (*)    MCV 75.5 (*)    MCH 25.0 (*)    RDW 15.7 (*)    All other components within normal limits  PRO B NATRIURETIC PEPTIDE - Abnormal; Notable for the following:    Pro B Natriuretic peptide (BNP) 12171.0 (*)    All other components within normal limits  POCT I-STAT, CHEM 8 - Abnormal; Notable for the following:    BUN 30 (*)    Creatinine, Ser 1.40 (*)    Glucose, Bld 298 (*)    Calcium, Ion 1.08 (*)    Hemoglobin 16.7 (*)    HCT 49.0 (*)    All other components within normal limits  POCT I-STAT TROPONIN I - Abnormal; Notable for the following:    Troponin i, poc 0.12 (*)    All other components within normal limits   Dg Chest 2 View  09/05/2012  *RADIOLOGY REPORT*  Clinical Data: Shortness of breath.  Cough.  CHEST - 2 VIEW  Comparison: Two-view chest 11/23/2011.  Findings: Cardiac enlargement is stable.  Mild pulmonary vascular congestion is similar to the prior study.  Mild bibasilar atelectasis is worse on the left.  There are no definite effusions. Mild degenerative changes in the thoracic spine are stable.  IMPRESSION:  1.  Stable cardiomegaly and similar pattern of mild pulmonary vascular congestion without frank edema. 2. No focal airspace disease.   Original Report Authenticated By: Marin Roberts, M.D.    EKG:  Rhythm: Sinus tachycardia Vent. rate 125 BPM PR interval 146 ms QRS duration 86 ms QT/QTc 306/441 ms Biatrial enlargement ST segments: Nonspecific ST changes   1. Heart failure   2. HTN (hypertension)       MDM  [age]-year-old female with dyspnea. Likely secondary to heart failure. Patient is significantly hypertensive. Chest x-ray with cardiomegaly and some vascular congestion. Patient will placed on a nitroglycerin drip. Lasix. Mild elevation of her troponin. She was given aspirin. Patient does not endorse any chest pain. EKG without acute ischemic changes. Patient's oxygen saturations are on room air but she is tachypneic.  Will defer from BiPAP at this time as medical treatment is initiated. Will continue to observe and reassess.        Raeford Razor, MD 09/05/12 [phone]

## 2012-09-06 ENCOUNTER — Inpatient Hospital Stay (HOSPITAL_COMMUNITY): Payer: No Typology Code available for payment source

## 2012-09-06 LAB — BASIC METABOLIC PANEL
BUN: 30 mg/dL — ABNORMAL HIGH (ref 6–23)
BUN: 28 mg/dL — ABNORMAL HIGH (ref 6–23)
CO2: 26 mEq/L (ref 19–32)
Calcium: 8.7 mg/dL (ref 8.4–10.5)
Creatinine, Ser: 1.41 mg/dL — ABNORMAL HIGH (ref 0.50–1.10)
GFR calc Af Amer: 46 mL/min — ABNORMAL LOW (ref >90)
GFR calc non Af Amer: 46 mL/min — ABNORMAL LOW (ref >90)
GFR calc non Af Amer: 40 mL/min — ABNORMAL LOW (ref >90)
Glucose, Bld: 203 mg/dL — ABNORMAL HIGH (ref 70–99)
Glucose, Bld: 116 mg/dL — ABNORMAL HIGH (ref 70–99)

## 2012-09-06 LAB — PROTIME-INR: Prothrombin Time: 14.4 seconds (ref 11.6–15.2)

## 2012-09-06 LAB — CBC
HCT: 39.1 % (ref 36.0–46.0)
MCH: 25.5 pg — ABNORMAL LOW (ref 26.0–34.0)
MCHC: 33.8 g/dL (ref 30.0–36.0)
RDW: 15.6 % — ABNORMAL HIGH (ref 11.5–15.5)

## 2012-09-06 LAB — GLUCOSE, CAPILLARY
Glucose-Capillary: 116 mg/dL — ABNORMAL HIGH (ref 70–99)
Glucose-Capillary: 92 mg/dL (ref 70–99)
Glucose-Capillary: 101 mg/dL — ABNORMAL HIGH (ref 70–99)

## 2012-09-06 LAB — HEMOGLOBIN A1C
Hgb A1c MFr Bld: 8.2 % — ABNORMAL HIGH (ref <5.7)
Mean Plasma Glucose: 189 mg/dL — ABNORMAL HIGH (ref <117)

## 2012-09-06 MED ORDER — SODIUM CHLORIDE 0.9 % IJ SOLN
3.0000 mL | Freq: Two times a day (BID) | INTRAMUSCULAR | Status: DC
  Administered 2012-09-06 – 2012-09-07 (×2): 3 mL via INTRAVENOUS

## 2012-09-06 MED ORDER — SODIUM CHLORIDE 0.9 % IJ SOLN: 3.0000 mL | INTRAMUSCULAR | Status: DC | PRN

## 2012-09-06 MED ORDER — DIAZEPAM 5 MG PO TABS
5.0000 mg | ORAL_TABLET | ORAL | Status: AC
  Administered 2012-09-07: 5 mg via ORAL
  Filled 2012-09-06: qty 1

## 2012-09-06 MED ORDER — SODIUM CHLORIDE 0.9 % IV SOLN: 250.0000 mL | INTRAVENOUS | Status: DC | PRN

## 2012-09-06 NOTE — Progress Notes (Signed)
MD notified of pt's lab values and HTN. Norvasc administered per MD order, and Nitro increased per order. Will continue to monitor.

## 2012-09-06 NOTE — Progress Notes (Signed)
Subjective:  Breathing better. Some headache with NTG drip.  Objective:  Vital Signs in the last 24 hours: Temp:  [97.3 F (36.3 C)-98.3 F (36.8 C)] 97.8 F (36.6 C) (02/27 1600) Pulse Rate:  [65-116] 91 (02/27 1702) Cardiac Rhythm:  [-] Sinus tachycardia (02/27 0800) Resp:  [13-35] 28 (02/27 1702) BP: (131-222)/(70-165) 155/85 mmHg (02/27 1702) SpO2:  [94 %-100 %] 99 % (02/27 1702) Weight:  [70.761 kg (156 lb)] 70.761 kg (156 lb) (02/26 2118)  Physical Exam: BP Readings from Last 1 Encounters:  09/06/12 155/85     Wt Readings from Last 1 Encounters:  09/05/12 70.761 kg (156 lb)    Weight change:   HEENT: [place]/AT, Eyes-Brown, PERL, EOMI, Conjunctiva-Pink, Sclera-Non-icteric Neck: + JVD, No bruit, Trachea midline. Lungs:  Clearing, Bilateral. Cardiac:  Regular rhythm, normal S1 and S2, no S3.  Abdomen:  Soft, non-tender. Extremities:  1 + edema present. No cyanosis. No clubbing. CNS: AxOx3, Cranial nerves grossly intact, moves all 4 extremities. Right handed. Skin: Warm and dry.   Intake/Output from previous day: 02/26 0701 - 02/27 0700 In: 169.1 [I.V.:169.1] Out: 1050 [Urine:1050]    Lab Results: BMET    Component Value Date/Time   NA 136 09/06/2012 0315   K 3.7 09/06/2012 0315   CL 101 09/06/2012 0315   CO2 26 09/06/2012 0315   GLUCOSE 203* 09/06/2012 0315   BUN 30* 09/06/2012 0315   CREATININE 1.25* 09/06/2012 0315   CALCIUM 8.7 09/06/2012 0315   GFRNONAA 46* 09/06/2012 0315   GFRAA 53* 09/06/2012 0315   CBC    Component Value Date/Time   WBC 7.2 09/05/2012 2145   RBC 5.67* 09/05/2012 2145   HGB 14.6 09/05/2012 2145   HCT 43.1 09/05/2012 2145   PLT 322 09/05/2012 2145   MCV 76.0* 09/05/2012 2145   MCH 25.7* 09/05/2012 2145   MCHC 33.9 09/05/2012 2145   RDW 15.8* 09/05/2012 2145   LYMPHSABS 2.0 11/23/2011 1529   MONOABS 0.5 11/23/2011 1529   EOSABS 0.0 11/23/2011 1529   BASOSABS 0.0 11/23/2011 1529   CARDIAC ENZYMES Lab Results  Component Value Date   CKTOTAL 49  11/24/2011   CKMB 2.8 11/24/2011   TROPONINI 1.30* 09/06/2012    Scheduled Meds: . amLODipine  5 mg Oral Daily  . aspirin EC  81 mg Oral Daily  . cloNIDine  0.2 mg Oral BID  . furosemide  40 mg Intravenous Q12H  . gatifloxacin  1 drop Both Eyes QID  . heparin  5,000 Units Subcutaneous Q8H  . insulin aspart  4 Units Subcutaneous TID WC  . insulin glargine  10 Units Subcutaneous QHS  . pantoprazole  40 mg Oral Daily  . potassium chloride  20 mEq Oral BID  . sodium chloride  3 mL Intravenous Q12H  . Ticagrelor  90 mg Oral BID   Continuous Infusions: . nitroGLYCERIN 10 mcg/min (09/06/12 1705)   PRN Meds:.sodium chloride, acetaminophen, ALPRAZolam, nitroGLYCERIN, ondansetron (ZOFRAN) IV, sodium chloride  Assessment/Plan: Acute systolic and diastolic heart failure  DM, II  Hypertension  Anxiety  R + L heart cath in AM.   LOS: 1 day    [name]  MD  09/06/2012, 5:41 PM

## 2012-09-06 NOTE — Progress Notes (Signed)
Inpatient Diabetes Program Recommendations  AACE/ADA: New Consensus Statement on Inpatient Glycemic Control (2013)  Target Ranges:  Prepandial:   less than 140 mg/dL      Peak postprandial:   less than 180 mg/dL (1-2 hours)      Critically ill patients:  140 - 180 mg/dL  Results for [name], [name] (MRN [id number]) as of 09/06/2012 11:31  Ref. Range 09/05/2012 22:32 09/06/2012 08:28  Glucose-Capillary Latest Range: 70-99 mg/dL 409 (H) 811 (H)   Inpatient Diabetes Program Recommendations Correction (SSI): Add Novolog SENSITIVE scale TID Thank you  Piedad Climes BSN, RN,CDE Inpatient Diabetes Coordinator [phone] (team pager)

## 2012-09-06 NOTE — Care Management Note (Signed)
    Page 1 of 1   09/06/2012     9:14:13 AM   CARE MANAGEMENT NOTE 09/06/2012  Patient:  [name], [name]   Account Number:  [id number]  Date Initiated:  09/06/2012  Documentation initiated by:  Junius Creamer  Subjective/Objective Assessment:   adm w heart failure, htn     Action/Plan:   lives w husband, pcp dr yerratapu, hx adv homecare   Anticipated DC Date:     Anticipated DC Plan:        DC Planning Services  CM consult      Choice offered to / List presented to:             Status of service:   Medicare Important Message given?   (If response is "NO", the following Medicare IM given date fields will be blank) Date Medicare IM given:   Date Additional Medicare IM given:    Discharge Disposition:    Per UR Regulation:  Reviewed for med. necessity/level of care/duration of stay  If discussed at Long Length of Stay Meetings, dates discussed:    Comments:  2/27 0913 debbie Brunetta [place] rn,bsn

## 2012-09-07 ENCOUNTER — Encounter (HOSPITAL_COMMUNITY)
Admission: EM | Disposition: A | Discharge status: Home / Self Care | Payer: Self-pay | Source: Home / Self Care | Attending: Cardiovascular Disease

## 2012-09-07 HISTORY — PX: LEFT AND RIGHT HEART CATHETERIZATION WITH CORONARY ANGIOGRAM: SHX5449

## 2012-09-07 LAB — BASIC METABOLIC PANEL
BUN: 26 mg/dL — ABNORMAL HIGH (ref 6–23)
CO2: 24 mEq/L (ref 19–32)
Calcium: 8 mg/dL — ABNORMAL LOW (ref 8.4–10.5)
Chloride: 100 mEq/L (ref 96–112)
Creatinine, Ser: 1.25 mg/dL — ABNORMAL HIGH (ref 0.50–1.10)
GFR calc Af Amer: 53 mL/min — ABNORMAL LOW (ref >90)

## 2012-09-07 LAB — POCT I-STAT 3, ART BLOOD GAS (G3+)
Bicarbonate: 25 mEq/L — ABNORMAL HIGH (ref 20.0–24.0)
pCO2 arterial: 32.5 mmHg — ABNORMAL LOW (ref 35.0–45.0)
pH, Arterial: 7.494 — ABNORMAL HIGH (ref 7.350–7.450)

## 2012-09-07 LAB — POCT I-STAT 3, VENOUS BLOOD GAS (G3P V)
Acid-Base Excess: 2 mmol/L (ref 0.0–2.0)
Bicarbonate: 26.1 mEq/L — ABNORMAL HIGH (ref 20.0–24.0)
TCO2: 27 mmol/L (ref 0–100)
pO2, Ven: 30 mmHg (ref 30.0–45.0)

## 2012-09-07 LAB — POCT ACTIVATED CLOTTING TIME: Activated Clotting Time: 138 seconds

## 2012-09-07 LAB — CK TOTAL AND CKMB (NOT AT ARMC): Relative Index: INVALID (ref 0.0–2.5)

## 2012-09-07 LAB — GLUCOSE, CAPILLARY: Glucose-Capillary: 138 mg/dL — ABNORMAL HIGH (ref 70–99)

## 2012-09-07 LAB — HEPARIN LEVEL (UNFRACTIONATED): Heparin Unfractionated: 0.19 IU/mL — ABNORMAL LOW (ref 0.30–0.70)

## 2012-09-07 SURGERY — LEFT AND RIGHT HEART CATHETERIZATION WITH CORONARY ANGIOGRAM
Anesthesia: Moderate Sedation

## 2012-09-07 MED ORDER — POTASSIUM CHLORIDE CRYS ER 20 MEQ PO TBCR: 20.0000 meq | EXTENDED_RELEASE_TABLET | Freq: Once | ORAL | Status: DC

## 2012-09-07 MED ORDER — SODIUM CHLORIDE 0.9 % IV SOLN
250.0000 mL | INTRAVENOUS | Status: DC | PRN
  Administered 2012-09-08: 250 mL via INTRAVENOUS

## 2012-09-07 MED ORDER — HEPARIN SOD (PORCINE) IN D5W 100 UNIT/ML IV SOLN
INTRAVENOUS | Status: AC
  Filled 2012-09-07: qty 250

## 2012-09-07 MED ORDER — FENTANYL CITRATE 0.05 MG/ML IJ SOLN
INTRAMUSCULAR | Status: AC
  Filled 2012-09-07: qty 2

## 2012-09-07 MED ORDER — HEPARIN BOLUS VIA INFUSION
5000.0000 [IU] | Freq: Once | INTRAVENOUS | Status: AC
  Administered 2012-09-07: 5000 [IU] via INTRAVENOUS
  Filled 2012-09-07: qty 5000

## 2012-09-07 MED ORDER — HEPARIN (PORCINE) IN NACL 100-0.45 UNIT/ML-% IJ SOLN
1300.0000 [IU]/h | INTRAMUSCULAR | Status: DC
  Administered 2012-09-07: 850 [IU]/h via INTRAVENOUS
  Administered 2012-09-08 (×2): 1200 [IU]/h via INTRAVENOUS
  Administered 2012-09-09: 1300 [IU]/h via INTRAVENOUS
  Filled 2012-09-07 (×3): qty 250

## 2012-09-07 MED ORDER — LIDOCAINE HCL (PF) 1 % IJ SOLN
INTRAMUSCULAR | Status: AC
  Filled 2012-09-07: qty 30

## 2012-09-07 MED ORDER — HEPARIN (PORCINE) IN NACL 2-0.9 UNIT/ML-% IJ SOLN
INTRAMUSCULAR | Status: AC
  Filled 2012-09-07: qty 1000

## 2012-09-07 MED ORDER — MIDAZOLAM HCL 2 MG/2ML IJ SOLN
INTRAMUSCULAR | Status: AC
  Filled 2012-09-07: qty 2

## 2012-09-07 MED ORDER — HEPARIN (PORCINE) IN NACL 100-0.45 UNIT/ML-% IJ SOLN
1000.0000 [IU]/h | INTRAMUSCULAR | Status: DC
  Administered 2012-09-07: 1000 [IU]/h via INTRAVENOUS
  Filled 2012-09-07: qty 250

## 2012-09-07 MED ORDER — SODIUM CHLORIDE 0.9 % IJ SOLN: 3.0000 mL | INTRAMUSCULAR | Status: DC | PRN

## 2012-09-07 MED ORDER — SODIUM CHLORIDE 0.9 % IJ SOLN: 3.0000 mL | Freq: Two times a day (BID) | INTRAMUSCULAR | Status: DC

## 2012-09-07 MED ORDER — METOPROLOL TARTRATE 1 MG/ML IV SOLN
5.0000 mg | Freq: Once | INTRAVENOUS | Status: AC
  Administered 2012-09-07: 5 mg via INTRAVENOUS

## 2012-09-07 MED ORDER — METOPROLOL TARTRATE 1 MG/ML IV SOLN
INTRAVENOUS | Status: AC
  Filled 2012-09-07: qty 5

## 2012-09-07 NOTE — Progress Notes (Signed)
Subjective:  Right shoulder pain with EKG showing ST elevations in inferior leads. 2-D echo earlier without significant pericardial effusion and severe LV systolic dysfunction. EF 25-30. Moderate MR, TR and mild AI and PI. Post IV NTG ST changes returning to baseline and atypical chest pain resolving and patient resting comfortably. Quick look echo now with slightly more posterior pericardial effusion.  Objective:  Vital Signs in the last 24 hours: Temp:  [97.8 F (36.6 C)-98.8 F (37.1 C)] 98.8 F (37.1 C) (02/27 2338) Pulse Rate:  [76-116] 87 (02/28 0100) Cardiac Rhythm:  [-] Normal sinus rhythm (02/27 2339) Resp:  [14-34] 23 (02/28 0100) BP: (131-186)/(70-111) 169/82 mmHg (02/27 2339) SpO2:  [94 %-100 %] 94 % (02/28 0100)  Physical Exam: BP Readings from Last 1 Encounters:  09/06/12 169/82     Wt Readings from Last 1 Encounters:  09/05/12 70.761 kg (156 lb)    Weight change:   HEENT: [place]/AT, Eyes-Brown, PERL, EOMI, Conjunctiva-Pink, Sclera-Non-icteric Neck: + JVD, No bruit, Trachea midline. Lungs:  Clear, Bilateral. Cardiac:  Regular rhythm, normal S1 and S2, no S3. II/VI systolic and diastolic murmur. Abdomen:  Soft, non-tender. Extremities:  No edema present. No cyanosis. No clubbing. CNS: AxOx3, Cranial nerves grossly intact, moves all 4 extremities. Right handed. Skin: Warm and dry.   Intake/Output from previous day: 02/27 0701 - 02/28 0700 In: 670.6 [I.V.:670.6] Out: 1575 [Urine:1575]    Lab Results: BMET    Component Value Date/Time   NA 138 09/06/2012 2000   K 3.8 09/06/2012 2000   CL 100 09/06/2012 2000   CO2 26 09/06/2012 2000   GLUCOSE 116* 09/06/2012 2000   BUN 28* 09/06/2012 2000   CREATININE 1.41* 09/06/2012 2000   CALCIUM 8.5 09/06/2012 2000   GFRNONAA 40* 09/06/2012 2000   GFRAA 46* 09/06/2012 2000   CBC    Component Value Date/Time   WBC 6.6 09/06/2012 2000   RBC 5.18* 09/06/2012 2000   HGB 13.2 09/06/2012 2000   HCT 39.1 09/06/2012 2000   PLT 300  09/06/2012 2000   MCV 75.5* 09/06/2012 2000   MCH 25.5* 09/06/2012 2000   MCHC 33.8 09/06/2012 2000   RDW 15.6* 09/06/2012 2000   LYMPHSABS 2.0 11/23/2011 1529   MONOABS 0.5 11/23/2011 1529   EOSABS 0.0 11/23/2011 1529   BASOSABS 0.0 11/23/2011 1529   CARDIAC ENZYMES Lab Results  Component Value Date   CKTOTAL 49 11/24/2011   CKMB 2.8 11/24/2011   TROPONINI 1.30* 09/06/2012    Scheduled Meds: . amLODipine  5 mg Oral Daily  . aspirin EC  81 mg Oral Daily  . cloNIDine  0.2 mg Oral BID  . diazepam  5 mg Oral On Call  . furosemide  40 mg Intravenous Q12H  . gatifloxacin  1 drop Both Eyes QID  . insulin aspart  4 Units Subcutaneous TID WC  . insulin glargine  10 Units Subcutaneous QHS  . metoprolol      . pantoprazole  40 mg Oral Daily  . potassium chloride  20 mEq Oral BID  . sodium chloride  3 mL Intravenous Q12H  . sodium chloride  3 mL Intravenous Q12H  . Ticagrelor  90 mg Oral BID   Continuous Infusions: . heparin Stopped (09/07/12 0435)  . nitroGLYCERIN 35 mcg/min (09/07/12 0358)   PRN Meds:.sodium chloride, sodium chloride, acetaminophen, ALPRAZolam, nitroGLYCERIN, ondansetron (ZOFRAN) IV, sodium chloride, sodium chloride  Assessment/Plan: Acute systolic and diastolic heart failure  DM, II  Hypertension  Anxiety Acute pericarditis v/s acute inferior  wall MI. Moderate MR and TR. Mild AI and PI.  R + L Heart cath soon.   LOS: 2 days    [name]  MD  09/07/2012, 4:54 AM

## 2012-09-07 NOTE — Progress Notes (Signed)
Pt noted to have a change in EKG. 12 lead EKG was done and revealed acute MI. MD paged, notified of EKG change and ST elevation. Ordered to restart Nitro, order Troponin and CK-MB. Nitro restarted immediately. Pt is asymptomatic at this time states no chest pain, or SOB. MD on way. Will continue to monitor closely. VSS at this time.

## 2012-09-07 NOTE — Progress Notes (Signed)
MD at bedside performing ECHO pt is no c/o a reflux type pain at this time.

## 2012-09-07 NOTE — Progress Notes (Signed)
ANTICOAGULATION CONSULT NOTE - Initial Consult  Pharmacy Consult for heparin Indication: chest pain/ACS  No Known Allergies  Patient Measurements: Height: 5\' 4"  (162.6 cm) Weight: 161 lb 4.8 oz (73.165 kg) IBW/kg (Calculated) : 54.7 Heparin Dosing Weight: 70.0 kg  Vital Signs: Temp: 97.5 F (36.4 C) (02/28 1218) Temp src: Axillary (02/28 1218) BP: 138/107 mmHg (02/28 1200) Pulse Rate: 87 (02/28 1200)  Labs:  Recent Labs  09/05/12 1651  09/05/12 1714  09/05/12 2145 09/06/12 0315 09/06/12 0318 09/06/12 0916 09/06/12 2000 09/07/12 0444  HGB 14.7  --  16.7*  --  14.6  --   --   --  13.2  --   HCT 44.3  --  49.0*  --  43.1  --   --   --  39.1  --   PLT 318  --   --   --  322  --   --   --  300  --   LABPROT  --   --   --   --   --   --   --   --  14.4  --   INR  --   --   --   --   --   --   --   --  1.14  --   CREATININE  --   < > 1.40*  --  1.20* 1.25*  --   --  1.41* 1.25*  CKTOTAL  --   --   --   --   --   --   --   --   --  59  CKMB  --   --   --   --   --   --   --   --   --  3.2  TROPONINI  --   --   --   < > 0.79*  --  1.10* 1.30*  --  0.88*  < > = values in this interval not displayed.  Estimated Creatinine Clearance: 46.9 ml/min (by C-G formula based on Cr of 1.25).   Medical History: Past Medical History  Diagnosis Date  . Hypertension   . Diabetes mellitus   . Heart failure   . Coronary artery disease   . Stented coronary artery     Medications:  Scheduled:  . amLODipine  5 mg Oral Daily  . aspirin EC  81 mg Oral Daily  . cloNIDine  0.2 mg Oral BID  . [COMPLETED] diazepam  5 mg Oral On Call  . [COMPLETED] fentaNYL      . furosemide  40 mg Intravenous Q12H  . gatifloxacin  1 drop Both Eyes QID  . [COMPLETED] heparin      . [COMPLETED] heparin  5,000 Units Intravenous Once  . insulin aspart  4 Units Subcutaneous TID WC  . insulin glargine  10 Units Subcutaneous QHS  . [COMPLETED] lidocaine (PF)      . metoprolol      . [COMPLETED] metoprolol   5 mg Intravenous Once  . [COMPLETED] midazolam      . pantoprazole  40 mg Oral Daily  . [COMPLETED] potassium chloride  20 mEq Oral BID  . potassium chloride  20 mEq Oral Once  . sodium chloride  3 mL Intravenous Q12H  . sodium chloride  3 mL Intravenous Q12H  . sodium chloride  3 mL Intravenous Q12H  . Ticagrelor  90 mg Oral BID  . [DISCONTINUED] heparin  5,000 Units Subcutaneous Q8H  Assessment: 22 yoF admitted for CHF exacerbation; experienced STEMI this AM 2/28; given heparin and taken to cath without PCI. Pharmacy consulted for heparin post sheath removal. No bleeding issues per RN. CBC wnl; INR 1.14 on admit. No previous heparin level drawn.  Goal of Therapy:  Heparin level 0.3-0.7 units/ml Monitor platelets by anticoagulation protocol: Yes   Plan:  Begin heparin infusion at 850 units/hr (12 units/kg/hr), starting today at 1700 Draw heparin level 6 hrs after start of infusion Daily heparin level and CBC starting 09/08/12 Continue to monitor for s/s of bleeding  Bernadene Person PharmD Candidate 09/07/2012 1:22 PM

## 2012-09-07 NOTE — Progress Notes (Signed)
MD ordered to stop heparin infusion. Lopressor ordered and given. Will continue to monitor.

## 2012-09-07 NOTE — Progress Notes (Signed)
Pt states still no chest pain, but does have some shoulder tightness which she thought was related to the way she slept. MD informed at bedside, ECHO done at this time. Pt still has no pain. Heparin drip started, and Nitro drip increased per order.

## 2012-09-07 NOTE — Progress Notes (Signed)
Back from the cath lab awake and alert . Instructions given  And bedrest emphasized.

## 2012-09-07 NOTE — CV Procedure (Signed)
PROCEDURE: Right and left heart catheterization with selective coronary angiography, left ventriculogram, cardiac output study.  CLINICAL HISTORY:  This is a 62 years old female with known CAD and LAD stent had acute left heart failure with abnormal cardiac enzymes and transient inferior wall acute injury.  The risks, benefits, and details of the procedure were explained to the patient.  The patient verbalized understanding and wanted to proceed.  Informed written consent was obtained.  PROCEDURE TECHNIQUE:  The patient was approached from the right femoral artery using a 5 French short sheath and right femoral vein using a 7 french sheath. Left coronary angiography was done using a Judkins L4 guide catheter.  Right coronary angiography was done using a Judkins R4 guide catheter.  Left ventriculography was not done to conserve dye use. Using Theone Murdoch catheters right heart pressures were measured and cardiac output study was carried out.    CONTRAST:  Total of 30 cc.  COMPLICATIONS:  None.  At the end of the procedure a manual device was used for hemostasis.    HEMODYNAMICS:  Aortic pressure was 126/75; LV pressure was 128/10; LVEDP 18.  There was no gradient between the left ventricle and aorta.   RA-13/10, RV-58/5/12, PA-56/19-mean 36. PW-29/30. [place] output was 4.5 L by Fick and 4.21 by Thermal method.  O2 sat was 61 % in PA and 90 % in AO.  ANGIOGRAM/CORONARY ARTERIOGRAM:   The left main coronary artery has distal 50 % stenosis and mild calcification.  The left anterior descending artery has luminal irregularities, patent proximal LAD stent and distal total occlusion with faint filling of apical LAD.  The left circumflex artery has mild diffuse disease. OM 1 is large vessel with proximal concentric 50 % stenosis.  The right coronary artery is dominant and has diffuse disease from 30 % to 40 % stenoses.  LEFT VENTRICULOGRAM:  Left ventricular angiogram was not done.Echocardiogram showed  severe systolic dysfunction with an estimated ejection fraction of 25-30 %.  LVEDP was 18 mmHg.  IMPRESSION OF HEART CATHETERIZATION:   1. Moderate left main coronary artery disease. 2. Severe left anterior descending artery disease with patent stent. 3. Moderate left circumflex artery and its branches disease. 4. Moderate right coronary artery disease. 5. Severe left ventricular systolic dysfunction.  LVEDP 18 mmHg.  Ejection fraction 25-30%. 6.  Elevated right heart pressures with moderate pulmonary hypertension.  RECOMMENDATION:   Medical treatment for now. Surgical evaluation as needed.

## 2012-09-07 NOTE — Progress Notes (Signed)
I agree with the above assessment and plan. Will restart conservative heparin dosing tonight and adjust rate based on levels.   [name]  09/07/2012 1:47 PM

## 2012-09-07 NOTE — Progress Notes (Signed)
Continuing to increase Nitro per order. Pt says reflux pain is decreasing at this time, and BP decreased slightly at this time.

## 2012-09-07 NOTE — Progress Notes (Signed)
Pt BP has decreased to 160/95 at 90 mcg of Nitro, "reflux pain" as the pt describes it has decreased. MD does not want to decrease BP anymore due to possible pericarditis on the right side of heart. Plan is to keep pt scheduled cath lab time. Will continue to monitor at this time.

## 2012-09-08 LAB — CBC
Hemoglobin: 13 g/dL (ref 12.0–15.0)
MCH: 25.1 pg — ABNORMAL LOW (ref 26.0–34.0)
MCHC: 33.1 g/dL (ref 30.0–36.0)
MCV: 75.9 fL — ABNORMAL LOW (ref 78.0–100.0)
RBC: 5.18 MIL/uL — ABNORMAL HIGH (ref 3.87–5.11)

## 2012-09-08 LAB — BASIC METABOLIC PANEL
CO2: 26 mEq/L (ref 19–32)
Chloride: 100 mEq/L (ref 96–112)
Sodium: 138 mEq/L (ref 135–145)

## 2012-09-08 LAB — GLUCOSE, CAPILLARY
Glucose-Capillary: 97 mg/dL (ref 70–99)
Glucose-Capillary: 146 mg/dL — ABNORMAL HIGH (ref 70–99)

## 2012-09-08 MED ORDER — ISOSORB DINITRATE-HYDRALAZINE 20-37.5 MG PO TABS
1.0000 | ORAL_TABLET | Freq: Two times a day (BID) | ORAL | Status: DC
  Administered 2012-09-08 – 2012-09-09 (×3): 1 via ORAL
  Filled 2012-09-08 (×4): qty 1

## 2012-09-08 MED ORDER — POTASSIUM CHLORIDE CRYS ER 20 MEQ PO TBCR
30.0000 meq | EXTENDED_RELEASE_TABLET | Freq: Three times a day (TID) | ORAL | Status: DC
  Administered 2012-09-08 – 2012-09-10 (×7): 30 meq via ORAL
  Filled 2012-09-08 (×9): qty 1

## 2012-09-08 MED ORDER — HEPARIN BOLUS VIA INFUSION
1000.0000 [IU] | Freq: Once | INTRAVENOUS | Status: AC
  Administered 2012-09-08: 1000 [IU] via INTRAVENOUS
  Filled 2012-09-08: qty 1000

## 2012-09-08 MED ORDER — WARFARIN - PHYSICIAN DOSING INPATIENT: Freq: Every day | Status: DC

## 2012-09-08 MED ORDER — MAGNESIUM SULFATE IN D5W 10-5 MG/ML-% IV SOLN
1.0000 g | Freq: Once | INTRAVENOUS | Status: AC
  Administered 2012-09-08: 1 g via INTRAVENOUS
  Filled 2012-09-08: qty 100

## 2012-09-08 MED ORDER — WARFARIN SODIUM 5 MG PO TABS
5.0000 mg | ORAL_TABLET | Freq: Every day | ORAL | Status: DC
  Administered 2012-09-08: 5 mg via ORAL
  Filled 2012-09-08 (×2): qty 1

## 2012-09-08 NOTE — Progress Notes (Signed)
Subjective:  Feeling jittery. No chest pain. Afebrile. Monitor-SR.  Objective:  Vital Signs in the last 24 hours: Temp:  [97.5 F (36.4 C)-98.9 F (37.2 C)] 98.4 F (36.9 C) (03/01 0730) Pulse Rate:  [80-110] 99 (02/28 1713) Cardiac Rhythm:  [-] Sinus tachycardia (02/28 2000) Resp:  [10-18] 16 (03/01 0335) BP: (134-176)/(74-131) 147/96 mmHg (03/01 0335) SpO2:  [96 %-100 %] 98 % (03/01 0730) Weight:  [73.6 kg (162 lb 4.1 oz)] 73.6 kg (162 lb 4.1 oz) (03/01 0500)  Physical Exam: BP Readings from Last 1 Encounters:  09/08/12 147/96     Wt Readings from Last 1 Encounters:  09/08/12 73.6 kg (162 lb 4.1 oz)    Weight change: 0.435 kg (15.3 oz)  HEENT: [place]/AT, Eyes-Brown, PERL, EOMI, Conjunctiva-Pink, Sclera-Non-icteric Neck: No JVD, No bruit, Trachea midline. Lungs:  Clear, Bilateral. Cardiac:  Regular rhythm, normal S1 and S2, no S3. II/VI systolic murmur LSB and apex. Abdomen:  Soft, non-tender. Extremities:  No edema present. No cyanosis. No clubbing. CNS: AxOx3, Cranial nerves grossly intact, moves all 4 extremities. Right handed. Skin: Warm and dry.   Intake/Output from previous day: 02/28 0701 - 03/01 0700 In: 1454.8 [P.O.:560; I.V.:894.8] Out: -     Lab Results: BMET    Component Value Date/Time   NA 138 09/08/2012 0500   K 3.3* 09/08/2012 0500   CL 100 09/08/2012 0500   CO2 26 09/08/2012 0500   GLUCOSE 107* 09/08/2012 0500   BUN 24* 09/08/2012 0500   CREATININE 1.25* 09/08/2012 0500   CALCIUM 8.2* 09/08/2012 0500   GFRNONAA 46* 09/08/2012 0500   GFRAA 53* 09/08/2012 0500   CBC    Component Value Date/Time   WBC 6.6 09/06/2012 2000   RBC 5.18* 09/06/2012 2000   HGB 13.2 09/06/2012 2000   HCT 39.1 09/06/2012 2000   PLT 300 09/06/2012 2000   MCV 75.5* 09/06/2012 2000   MCH 25.5* 09/06/2012 2000   MCHC 33.8 09/06/2012 2000   RDW 15.6* 09/06/2012 2000   LYMPHSABS 2.0 11/23/2011 1529   MONOABS 0.5 11/23/2011 1529   EOSABS 0.0 11/23/2011 1529   BASOSABS 0.0 11/23/2011 1529   CARDIAC  ENZYMES Lab Results  Component Value Date   CKTOTAL 59 09/07/2012   CKMB 3.2 09/07/2012   TROPONINI 0.88* 09/07/2012    Scheduled Meds: . amLODipine  5 mg Oral Daily  . aspirin EC  81 mg Oral Daily  . cloNIDine  0.2 mg Oral BID  . furosemide  40 mg Intravenous Q12H  . insulin aspart  4 Units Subcutaneous TID WC  . insulin glargine  10 Units Subcutaneous QHS  . isosorbide-hydrALAZINE  1 tablet Oral BID  . magnesium sulfate 1 - 4 g bolus IVPB  1 g Intravenous Once  . pantoprazole  40 mg Oral Daily  . potassium chloride  30 mEq Oral TID  . sodium chloride  3 mL Intravenous Q12H  . sodium chloride  3 mL Intravenous Q12H  . sodium chloride  3 mL Intravenous Q12H  . Ticagrelor  90 mg Oral BID  . warfarin  5 mg Oral q1800   Continuous Infusions: . heparin 1,000 Units/hr (09/08/12 0015)  . nitroGLYCERIN 70 mcg/min (09/08/12 0427)   PRN Meds:.sodium chloride, sodium chloride, sodium chloride, acetaminophen, ALPRAZolam, nitroGLYCERIN, ondansetron (ZOFRAN) IV, sodium chloride, sodium chloride, sodium chloride  Assessment/Plan: Acute systolic and diastolic heart failure Dilated and ischemic cardiomyopathy  Multi-vessel, native vessel, CAD S/P stent in LAD DM, II  Hypertension  Anxiety  Acute inferior wall  MI.  Moderate MR and TR.  Mild AI and PI. Hypokalemia Chronic kidney disease, stage II  Start coumadin Increase activity K+ supplement   LOS: 3 days    [name]  MD  09/08/2012, 9:42 AM

## 2012-09-08 NOTE — Progress Notes (Signed)
ANTICOAGULATION CONSULT NOTE - Follow Up Consult  Pharmacy Consult for heparin Indication: CAD  Labs:  Recent Labs  09/05/12 2145  09/06/12 0318 09/06/12 0916 09/06/12 2000 09/07/12 0444 09/07/12 2322 09/08/12 0500 09/08/12 0850  HGB 14.6  --   --   --  13.2  --   --   --  13.0  HCT 43.1  --   --   --  39.1  --   --   --  39.3  PLT 322  --   --   --  300  --   --   --  274  LABPROT  --   --   --   --  14.4  --   --   --   --   INR  --   --   --   --  1.14  --   --   --   --   HEPARINUNFRC  --   --   --   --   --   --  0.19*  --  0.21*  CREATININE 1.20*  < >  --   --  1.41* 1.25*  --  1.25*  --   CKTOTAL  --   --   --   --   --  59  --   --   --   CKMB  --   --   --   --   --  3.2  --   --   --   TROPONINI 0.79*  --  1.10* 1.30*  --  0.88*  --   --   --   < > = values in this interval not displayed.   Assessment: 61yo female subtherapeutic on heparin 1,000 units/hr. Resumed post-cath with moderate disease to be managed with medical treatment for now.  Goal of Therapy:  Heparin level 0.3-0.7 units/ml   Plan:  - Heparin bolus of 1000 units now - Increase heparin gtt to 1200 units/hr and check level in 6hr. - Daily heparin level and CBC   Vania Rea. Darin Engels.D. Clinical Pharmacist Pager [phone] Phone [phone] 09/08/2012 10:35 AM

## 2012-09-08 NOTE — Progress Notes (Signed)
ANTICOAGULATION CONSULT NOTE - Follow Up Consult  Pharmacy Consult for heparin Indication: CAD  Labs:  Recent Labs  09/05/12 1651  09/05/12 1714  09/05/12 2145 09/06/12 0315 09/06/12 0318 09/06/12 0916 09/06/12 2000 09/07/12 0444 09/07/12 2322  HGB 14.7  --  16.7*  --  14.6  --   --   --  13.2  --   --   HCT 44.3  --  49.0*  --  43.1  --   --   --  39.1  --   --   PLT 318  --   --   --  322  --   --   --  300  --   --   LABPROT  --   --   --   --   --   --   --   --  14.4  --   --   INR  --   --   --   --   --   --   --   --  1.14  --   --   HEPARINUNFRC  --   --   --   --   --   --   --   --   --   --  0.19*  CREATININE  --   < > 1.40*  --  1.20* 1.25*  --   --  1.41* 1.25*  --   CKTOTAL  --   --   --   --   --   --   --   --   --  59  --   CKMB  --   --   --   --   --   --   --   --   --  3.2  --   TROPONINI  --   --   --   < > 0.79*  --  1.10* 1.30*  --  0.88*  --   < > = values in this interval not displayed.   Assessment: 61yo female subtherapeutic on heparin after resumed post-cath with moderate disease to be managed with medical treatment for now.  Goal of Therapy:  Heparin level 0.3-0.7 units/ml   Plan:  Will increase heparin gtt by 2 units/kg/hr to 1000 units/hr and check level in 8hr.  Colleen Can PharmD BCPS 09/08/2012,12:07 AM

## 2012-09-08 NOTE — Progress Notes (Signed)
ANTICOAGULATION CONSULT NOTE - Follow Up Consult  Pharmacy Consult for Heparin Indication: CAD  Labs:  Recent Labs  09/05/12 2145  09/06/12 0318 09/06/12 0916 09/06/12 2000 09/07/12 0444 09/07/12 2322 09/08/12 0500 09/08/12 0850 09/08/12 1730  HGB 14.6  --   --   --  13.2  --   --   --  13.0  --   HCT 43.1  --   --   --  39.1  --   --   --  39.3  --   PLT 322  --   --   --  300  --   --   --  274  --   LABPROT  --   --   --   --  14.4  --   --   --   --   --   INR  --   --   --   --  1.14  --   --   --   --   --   HEPARINUNFRC  --   --   --   --   --   --  0.19*  --  0.21* 0.33  CREATININE 1.20*  < >  --   --  1.41* 1.25*  --  1.25*  --   --   CKTOTAL  --   --   --   --   --  59  --   --   --   --   CKMB  --   --   --   --   --  3.2  --   --   --   --   TROPONINI 0.79*  --  1.10* 1.30*  --  0.88*  --   --   --   --   < > = values in this interval not displayed.   Assessment: 61 y.o. F on heparin for anticoagulation post cardiac cath on 2/28 that revealed CAD. Heparin level this evening is therapeutic (HL 0.33, goal of 0.3-0.7). No overt s/sx of bleeding noted.   Goal of Therapy:  Heparin level 0.3-0.7 units/ml   Plan:  1. Continue Heparin at current drip rate of 1200 units/hr (12 ml/hr) 2. Will continue to monitor for any signs/symptoms of bleeding and will follow up with heparin level in 6 hours to confirm therapeutic  Georgina Pillion, PharmD, BCPS Clinical Pharmacist Pager: [phone] 09/08/2012 7:08 PM

## 2012-09-09 LAB — BASIC METABOLIC PANEL
BUN: 26 mg/dL — ABNORMAL HIGH (ref 6–23)
GFR calc Af Amer: 43 mL/min — ABNORMAL LOW (ref >90)
GFR calc non Af Amer: 37 mL/min — ABNORMAL LOW (ref >90)
Potassium: 4.6 mEq/L (ref 3.5–5.1)
Sodium: 135 mEq/L (ref 135–145)

## 2012-09-09 LAB — CBC
Platelets: 292 10*3/uL (ref 150–400)
RDW: 15.5 % (ref 11.5–15.5)
WBC: 7.3 10*3/uL (ref 4.0–10.5)

## 2012-09-09 LAB — GLUCOSE, CAPILLARY
Glucose-Capillary: 88 mg/dL (ref 70–99)
Glucose-Capillary: 143 mg/dL — ABNORMAL HIGH (ref 70–99)
Glucose-Capillary: 95 mg/dL (ref 70–99)

## 2012-09-09 LAB — PROTIME-INR: INR: 1.16 (ref 0.00–1.49)

## 2012-09-09 MED ORDER — FUROSEMIDE 40 MG PO TABS
40.0000 mg | ORAL_TABLET | Freq: Every day | ORAL | Status: DC
  Administered 2012-09-10: 40 mg via ORAL
  Filled 2012-09-09: qty 1

## 2012-09-09 MED ORDER — CLONIDINE HCL 0.2 MG PO TABS
0.2000 mg | ORAL_TABLET | Freq: Every day | ORAL | Status: DC
  Filled 2012-09-09: qty 1

## 2012-09-09 MED ORDER — SALINE SPRAY 0.65 % NA SOLN
1.0000 | NASAL | Status: DC | PRN
  Filled 2012-09-09: qty 44

## 2012-09-09 MED ORDER — ISOSORB DINITRATE-HYDRALAZINE 20-37.5 MG PO TABS
1.0000 | ORAL_TABLET | Freq: Three times a day (TID) | ORAL | Status: DC
  Administered 2012-09-09 – 2012-09-10 (×3): 1 via ORAL
  Filled 2012-09-09 (×6): qty 1

## 2012-09-09 NOTE — Progress Notes (Signed)
ANTICOAGULATION CONSULT NOTE - Follow Up Consult  Pharmacy Consult for heparin Indication: CAD  Labs:  Recent Labs  09/06/12 0318 09/06/12 0916 09/06/12 2000 09/07/12 0444  09/08/12 0500 09/08/12 0850 09/08/12 1730 09/08/12 2300  HGB  --   --  13.2  --   --   --  13.0  --   --   HCT  --   --  39.1  --   --   --  39.3  --   --   PLT  --   --  300  --   --   --  274  --   --   LABPROT  --   --  14.4  --   --   --   --   --   --   INR  --   --  1.14  --   --   --   --   --   --   HEPARINUNFRC  --   --   --   --   < >  --  0.21* 0.33 0.30  CREATININE  --   --  1.41* 1.25*  --  1.25*  --   --   --   CKTOTAL  --   --   --  59  --   --   --   --   --   CKMB  --   --   --  3.2  --   --   --   --   --   TROPONINI 1.10* 1.30*  --  0.88*  --   --   --   --   --   < > = values in this interval not displayed.   Assessment: 62yo female remains therapeutic on heparin post-cath, being managed medically for now, possibly to start Coumadin; heparin level at low end of goal and trending down.  Goal of Therapy:  Heparin level 0.3-0.7 units/ml   Plan:  Will increase heparin gtt slightly to 1300 units/hr and check level in 8hr.  Colleen Can PharmD BCPS 09/09/2012,12:15 AM

## 2012-09-09 NOTE — Progress Notes (Signed)
Subjective:  No chest pain. Drowsy/? Clonidine effect.  Objective:  Vital Signs in the last 24 hours: Temp:  [97.5 F (36.4 C)-99.3 F (37.4 C)] 97.8 F (36.6 C) (03/02 1149) Pulse Rate:  [79-86] 86 (03/01 1522) Cardiac Rhythm:  [-] Normal sinus rhythm (03/02 0731) Resp:  [18] 18 (03/02 1149) BP: (112-141)/(61-96) 141/87 mmHg (03/02 0746) SpO2:  [97 %-98 %] 97 % (03/02 1149) Weight:  [74.2 kg (163 lb 9.3 oz)] 74.2 kg (163 lb 9.3 oz) (03/02 0500)  Physical Exam: BP Readings from Last 1 Encounters:  09/09/12 141/87     Wt Readings from Last 1 Encounters:  09/09/12 74.2 kg (163 lb 9.3 oz)    Weight change: 0.6 kg (1 lb 5.2 oz)  HEENT: [place]/AT, Eyes-Brown, PERL, EOMI, Conjunctiva-Pink, Sclera-Non-icteric Neck: No JVD, No bruit, Trachea midline. Lungs:  Clear, Bilateral. Cardiac:  Regular rhythm, normal S1 and S2, no S3. II/VI systolic murmur. Abdomen:  Soft, non-tender. Extremities:  No edema present. No cyanosis. No clubbing. CNS: AxOx3, Cranial nerves grossly intact, moves all 4 extremities. Right handed. Skin: Warm and dry.   Intake/Output from previous day: 03/01 0701 - 03/02 0700 In: 1494.3 [P.O.:960; I.V.:434.3; IV Piggyback:100] Out: 1000 [Urine:1000]    Lab Results: BMET    Component Value Date/Time   NA 135 09/09/2012 0901   K 4.6 09/09/2012 0901   CL 100 09/09/2012 0901   CO2 21 09/09/2012 0901   GLUCOSE 88 09/09/2012 0901   BUN 26* 09/09/2012 0901   CREATININE 1.49* 09/09/2012 0901   CALCIUM 8.8 09/09/2012 0901   GFRNONAA 37* 09/09/2012 0901   GFRAA 43* 09/09/2012 0901   CBC    Component Value Date/Time   WBC 7.3 09/09/2012 0901   RBC 5.12* 09/09/2012 0901   HGB 13.1 09/09/2012 0901   HCT 38.1 09/09/2012 0901   PLT 292 09/09/2012 0901   MCV 74.4* 09/09/2012 0901   MCH 25.6* 09/09/2012 0901   MCHC 34.4 09/09/2012 0901   RDW 15.5 09/09/2012 0901   LYMPHSABS 2.0 11/23/2011 1529   MONOABS 0.5 11/23/2011 1529   EOSABS 0.0 11/23/2011 1529   BASOSABS 0.0 11/23/2011 1529   CARDIAC  ENZYMES Lab Results  Component Value Date   CKTOTAL 59 09/07/2012   CKMB 3.2 09/07/2012   TROPONINI 0.88* 09/07/2012    Scheduled Meds: . amLODipine  5 mg Oral Daily  . aspirin EC  81 mg Oral Daily  . [START ON 09/10/2012] cloNIDine  0.2 mg Oral QHS  . insulin aspart  4 Units Subcutaneous TID WC  . insulin glargine  10 Units Subcutaneous QHS  . isosorbide-hydrALAZINE  1 tablet Oral TID  . pantoprazole  40 mg Oral Daily  . potassium chloride  30 mEq Oral TID  . sodium chloride  3 mL Intravenous Q12H  . Ticagrelor  90 mg Oral BID   Continuous Infusions:  PRN Meds:.sodium chloride, acetaminophen, ALPRAZolam, nitroGLYCERIN, ondansetron (ZOFRAN) IV, sodium chloride, sodium chloride  Assessment/Plan: Acute systolic and diastolic heart failure  Dilated and ischemic cardiomyopathy  Multi-vessel, native vessel, CAD  S/P stent in LAD DM, II  Hypertension  Anxiety  Acute inferior wall MI.  Moderate MR and TR.  Mild AI and PI.  Hypokalemia -corrected Chronic kidney disease, stage II  DC coumadin. Change IV lasix to PO. Decrease AM clonidine. Increase Bidil to 3 times daily. Home soon   LOS: 4 days    [name]  MD  09/09/2012, 12:18 PM

## 2012-09-10 LAB — CBC
Hemoglobin: 12 g/dL (ref 12.0–15.0)
MCH: 24.9 pg — ABNORMAL LOW (ref 26.0–34.0)
MCHC: 33.1 g/dL (ref 30.0–36.0)
MCV: 75.3 fL — ABNORMAL LOW (ref 78.0–100.0)

## 2012-09-10 MED ORDER — ALPRAZOLAM 0.25 MG PO TABS: 0.2500 mg | ORAL_TABLET | Freq: Every evening | ORAL | Status: DC | PRN

## 2012-09-10 MED ORDER — NITROGLYCERIN 0.4 MG SL SUBL: 0.4000 mg | SUBLINGUAL_TABLET | SUBLINGUAL | Status: DC | PRN

## 2012-09-10 MED ORDER — CLONIDINE HCL 0.2 MG PO TABS: 0.2000 mg | ORAL_TABLET | Freq: Every day | ORAL | Status: DC

## 2012-09-10 MED ORDER — POTASSIUM CHLORIDE ER 10 MEQ PO TBCR: 10.0000 meq | EXTENDED_RELEASE_TABLET | Freq: Two times a day (BID) | ORAL | Status: DC

## 2012-09-10 MED ORDER — METOPROLOL TARTRATE 25 MG PO TABS: 25.0000 mg | ORAL_TABLET | Freq: Two times a day (BID) | ORAL | Status: DC

## 2012-09-10 MED ORDER — ISOSORB DINITRATE-HYDRALAZINE 20-37.5 MG PO TABS: 1.0000 | ORAL_TABLET | Freq: Three times a day (TID) | ORAL | Status: DC

## 2012-09-10 MED ORDER — GLIPIZIDE 5 MG PO TABS: 5.0000 mg | ORAL_TABLET | Freq: Two times a day (BID) | ORAL | Status: DC

## 2012-09-10 MED ORDER — PRAVASTATIN SODIUM 40 MG PO TABS: 40.0000 mg | ORAL_TABLET | Freq: Every day | ORAL | Status: DC

## 2012-09-10 MED ORDER — FUROSEMIDE 40 MG PO TABS: 80.0000 mg | ORAL_TABLET | Freq: Every day | ORAL | Status: DC

## 2012-09-10 NOTE — Progress Notes (Signed)
Pharmacist Heart Failure Core Measure Documentation  Assessment: [name] has an EF documented as 25 on 09/06/12 by echo.  Rationale: Heart failure patients with left ventricular systolic dysfunction (LVSD) and an EF < 40% should be prescribed an angiotensin converting enzyme inhibitor (ACEI) or angiotensin receptor blocker (ARB) at discharge unless a contraindication is documented in the medical record.  This patient is not currently on an ACEI or ARB for HF.  This note is being placed in the record in order to provide documentation that a contraindication to the use of these agents is present for this encounter.  ACE Inhibitor or Angiotensin Receptor Blocker is contraindicated (specify all that apply)  []   ACEI allergy AND ARB allergy []   Angioedema []   Moderate or severe aortic stenosis []   Hyperkalemia []   Hypotension []   Renal artery stenosis [x]   Worsening renal function, preexisting renal disease or dysfunction   [name] 09/10/2012 7:57 AM

## 2012-09-10 NOTE — Progress Notes (Signed)
Pt and husband educated on medications/diet/diagnosis, pt signed, IV removed, discharge instructions given along with HF packet, and prescriptions. Pt wheeled to car, home with husband

## 2012-09-10 NOTE — Discharge Summary (Signed)
Physician Discharge Summary  Patient ID: [name] MRN: [id number] DOB/AGE: 1950-11-27 62 y.o.  Admit date: 09/05/2012 Discharge date: 09/10/2012  Admission Diagnoses: Acute systolic and diastolic heart failure  Dilated and ischemic cardiomyopathy  Multi-vessel, native vessel, CAD  S/P stent in LAD  DM, II  Hypertension  Anxiety  Moderate MR and TR.  Mild AI and PI.  Hypokalemia   Chronic kidney disease, stage II  Discharge Diagnoses:  Principle Problem: * Acute inferior wall MI* Acute on chronic systolic and diastolic heart failure*  Dilated and ischemic cardiomyopathy  Multi-vessel, native vessel, CAD  S/P stent in LAD  DM, II  Hypertension  Anxiety  Moderate MR and TR.  Mild AI and PI.  Hypokalemia -corrected  Chronic kidney disease, stage II  Discharged Condition: good  Hospital Course: 62 years old female with DM, II and dilated ischemic cardiomyopathy had progressive leg edema and shortness of breath x 6 days after running out of medications. She had acute inferior wall MI resulting in acute heart failure. Her cardiac catheterization confirmed distal LAD occlusion with patent stent. She did not receive ACE inhibitor due to renal dysfunction. She improved with addition of vasodilator and diuretic. She was started on aspirin and Brilinta for her CAD and Pravachol 40 mg. For elevated cholesterol. Her insulin and metformin were changed to glucotrol per patient request. She will be followed by me in 1 week. Patient to bring fasting and 2 hour post prandial sugar record.  Consults: None  Significant Diagnostic Studies: labs: Normal CBC, electrolytes, BUN/Cr. Elevated at 30/1.4  Treatments: cardiac meds: metoprolol, amlodipine, aspirin, Brilinta, Bidil, Clonidine and Pravachol.  Discharge Exam: Blood pressure 120/55, pulse 86, temperature 98.9 F (37.2 C), temperature source Oral, resp. rate 18, height 5\' 4"  (1.626 m), weight 73 kg (160 lb 15 oz), SpO2  95.00%.  HEENT: Centre/AT, Eyes-Brown, PERL, EOMI, Conjunctiva-Pink, Sclera-Non-icteric  Neck: No JVD, No bruit, Trachea midline.  Lungs: Clear, Bilateral.  Cardiac: Regular rhythm, normal S1 and S2, no S3. II/VI systolic murmur LSB and apex.  Abdomen: Soft, non-tender.  Extremities: No edema present. No cyanosis. No clubbing.  CNS: AxOx3, Cranial nerves grossly intact, moves all 4 extremities. Right handed.  Skin: Warm and dry.  Disposition: 01-Home or Self Care     Medication List    TAKE these medications       amLODipine 5 MG tablet  Commonly known as:  NORVASC  Take 1 tablet (5 mg total) by mouth daily.     aspirin EC 81 MG tablet  Take 81 mg by mouth daily.     cloNIDine 0.2 MG tablet  Commonly known as:  CATAPRES  Take 1 tablet (0.2 mg total) by mouth at bedtime.     furosemide 40 MG tablet  Commonly known as:  LASIX  Take 2 tablets (80 mg total) by mouth daily.     glipiZIDE 5 MG tablet  Commonly known as:  GLUCOTROL  Take 1 tablet (5 mg total) by mouth 2 (two) times daily before a meal.     isosorbide-hydrALAZINE 20-37.5 MG per tablet  Commonly known as:  BIDIL  Take 1 tablet by mouth 3 (three) times daily.     metFORMIN 500 MG tablet  Commonly known as:  GLUCOPHAGE  Take 500 mg by mouth 2 (two) times daily with a meal.     moxifloxacin 0.5 % ophthalmic solution  Commonly known as:  VIGAMOX  Place 1 drop into the left eye 4 (four) times daily.  multivitamin with minerals Tabs  Take 1 tablet by mouth daily.     nitroGLYCERIN 0.4 MG SL tablet  Commonly known as:  NITROSTAT  Place 1 tablet (0.4 mg total) under the tongue every 5 (five) minutes as needed for chest pain.     omeprazole 20 MG capsule  Commonly known as:  PRILOSEC  Take 20 mg by mouth daily.     potassium chloride 10 MEQ tablet  Commonly known as:  K-DUR  Take 1 tablet (10 mEq total) by mouth 2 (two) times daily. 11/21/11 last dose.     Ticagrelor 90 MG Tabs tablet  Commonly known as:   BRILINTA  Take 90 mg by mouth 2 (two) times daily.           Follow-up Information   Follow up with [facility] S, MD. Schedule an appointment as soon as possible for a visit in 1 week.   Contact information:   [address] [place] [address] [phone]       Signed: [name] 09/10/2012, 9:50 AM

## 2012-09-11 LAB — GLUCOSE, CAPILLARY: Glucose-Capillary: 140 mg/dL — ABNORMAL HIGH (ref 70–99)

## 2012-12-30 ENCOUNTER — Emergency Department (HOSPITAL_COMMUNITY): Payer: No Typology Code available for payment source

## 2012-12-30 ENCOUNTER — Encounter (HOSPITAL_COMMUNITY): Payer: Self-pay | Admitting: Emergency Medicine

## 2012-12-30 ENCOUNTER — Inpatient Hospital Stay (HOSPITAL_COMMUNITY)
Admission: EM | Admit: 2012-12-30 | Discharge: 2013-01-03 | DRG: 293 | Disposition: A | Discharge status: Home / Self Care | Payer: No Typology Code available for payment source | Attending: Cardiovascular Disease | Admitting: Cardiovascular Disease

## 2012-12-30 DIAGNOSIS — I2510 Atherosclerotic heart disease of native coronary artery without angina pectoris: Secondary | ICD-10-CM | POA: Diagnosis present

## 2012-12-30 DIAGNOSIS — I5021 Acute systolic (congestive) heart failure: Principal | ICD-10-CM | POA: Diagnosis present

## 2012-12-30 DIAGNOSIS — Z9119 Patient's noncompliance with other medical treatment and regimen: Secondary | ICD-10-CM

## 2012-12-30 DIAGNOSIS — E7800 Pure hypercholesterolemia, unspecified: Secondary | ICD-10-CM | POA: Diagnosis present

## 2012-12-30 DIAGNOSIS — I2589 Other forms of chronic ischemic heart disease: Secondary | ICD-10-CM | POA: Diagnosis present

## 2012-12-30 DIAGNOSIS — J4489 Other specified chronic obstructive pulmonary disease: Secondary | ICD-10-CM | POA: Diagnosis present

## 2012-12-30 DIAGNOSIS — I509 Heart failure, unspecified: Secondary | ICD-10-CM | POA: Diagnosis present

## 2012-12-30 DIAGNOSIS — N182 Chronic kidney disease, stage 2 (mild): Secondary | ICD-10-CM | POA: Diagnosis present

## 2012-12-30 DIAGNOSIS — Z91199 Patient's noncompliance with other medical treatment and regimen due to unspecified reason: Secondary | ICD-10-CM

## 2012-12-30 DIAGNOSIS — R0601 Orthopnea: Secondary | ICD-10-CM | POA: Diagnosis present

## 2012-12-30 DIAGNOSIS — E119 Type 2 diabetes mellitus without complications: Secondary | ICD-10-CM | POA: Diagnosis present

## 2012-12-30 DIAGNOSIS — J449 Chronic obstructive pulmonary disease, unspecified: Secondary | ICD-10-CM | POA: Diagnosis present

## 2012-12-30 DIAGNOSIS — I252 Old myocardial infarction: Secondary | ICD-10-CM

## 2012-12-30 DIAGNOSIS — I110 Hypertensive heart disease with heart failure: Secondary | ICD-10-CM

## 2012-12-30 DIAGNOSIS — I129 Hypertensive chronic kidney disease with stage 1 through stage 4 chronic kidney disease, or unspecified chronic kidney disease: Secondary | ICD-10-CM | POA: Diagnosis present

## 2012-12-30 DIAGNOSIS — Z9861 Coronary angioplasty status: Secondary | ICD-10-CM

## 2012-12-30 LAB — TROPONIN I: Troponin I: <0.3 ng/mL (ref <0.30)

## 2012-12-30 LAB — BASIC METABOLIC PANEL
Calcium: 8.6 mg/dL (ref 8.4–10.5)
GFR calc Af Amer: 55 mL/min — ABNORMAL LOW (ref >90)
GFR calc non Af Amer: 47 mL/min — ABNORMAL LOW (ref >90)
Glucose, Bld: 207 mg/dL — ABNORMAL HIGH (ref 70–99)
Sodium: 135 mEq/L (ref 135–145)

## 2012-12-30 LAB — PRO B NATRIURETIC PEPTIDE: Pro B Natriuretic peptide (BNP): 11752 pg/mL — ABNORMAL HIGH (ref 0–125)

## 2012-12-30 LAB — CBC
MCH: 24.3 pg — ABNORMAL LOW (ref 26.0–34.0)
MCHC: 31.9 g/dL (ref 30.0–36.0)
Platelets: 245 10*3/uL (ref 150–400)
RDW: 19.7 % — ABNORMAL HIGH (ref 11.5–15.5)

## 2012-12-30 MED ORDER — NITROGLYCERIN IN D5W 200-5 MCG/ML-% IV SOLN
2.0000 ug/min | Freq: Once | INTRAVENOUS | Status: AC
  Administered 2012-12-30: 5 ug/min via INTRAVENOUS
  Filled 2012-12-30: qty 250

## 2012-12-30 MED ORDER — FUROSEMIDE 10 MG/ML IJ SOLN
80.0000 mg | Freq: Once | INTRAMUSCULAR | Status: AC
  Administered 2012-12-30: 80 mg via INTRAVENOUS
  Filled 2012-12-30: qty 8

## 2012-12-30 MED ORDER — SODIUM CHLORIDE 0.9 % IV SOLN
INTRAVENOUS | Status: DC
  Administered 2012-12-30: 18:00:00 via INTRAVENOUS

## 2012-12-30 NOTE — ED Provider Notes (Signed)
History     CSN: [id number]  Arrival date & time 12/30/12  1623   First MD Initiated Contact with Patient 12/30/12 1649      Chief Complaint  Patient presents with  . Shortness of Breath  . Leg Swelling    (Consider location/radiation/quality/duration/timing/severity/associated sxs/prior treatment) The history is provided by the patient.   62 year old female followed by [name], with known history of hypertension and congestive heart fair he. Patient has had bilateral leg swelling that started about a week ago was gotten significantly worse and then today developed shortness of breath reminiscent of her congestive heart failure. Patient has required admissions for acute respiratory distress due to congestive heart failure. Denies any chest pain. Denies headache. Patient ran out of her blood pressure medicine 2 days ago.  Past Medical History  Diagnosis Date  . Hypertension   . Diabetes mellitus   . Heart failure   . Coronary artery disease   . Stented coronary artery     History reviewed. No pertinent past surgical history.  No family history on file.  History  Substance Use Topics  . Smoking status: Never Smoker   . Smokeless tobacco: Not on file  . Alcohol Use: No    OB History   Grav Para Term Preterm Abortions TAB SAB Ect Mult Living                  Review of Systems  Constitutional: Negative for fever.  HENT: Negative for congestion.   Respiratory: Positive for shortness of breath. Negative for chest tightness.   Cardiovascular: Positive for leg swelling. Negative for chest pain.  Gastrointestinal: Negative for abdominal pain.  Genitourinary: Negative for dysuria.  Musculoskeletal: Negative for back pain.  Skin: Negative for rash.  Neurological: Negative for headaches.  Hematological: Does not bruise/bleed easily.  Psychiatric/Behavioral: Negative for confusion.    Allergies  Review of patient's allergies indicates no known allergies.  Home  Medications   Current Outpatient Rx  Name  Route  Sig  Dispense  Refill  . amLODipine (NORVASC) 5 MG tablet   Oral   Take 1 tablet (5 mg total) by mouth daily.         Marland Kitchen aspirin EC 81 MG tablet   Oral   Take 81 mg by mouth daily.         . cloNIDine (CATAPRES) 0.2 MG tablet   Oral   Take 1 tablet (0.2 mg total) by mouth at bedtime.   30 tablet      . furosemide (LASIX) 40 MG tablet   Oral   Take 2 tablets (80 mg total) by mouth daily.   120 tablet   1   . isosorbide-hydrALAZINE (BIDIL) 20-37.5 MG per tablet   Oral   Take 1 tablet by mouth 3 (three) times daily.   90 tablet   1   . metoprolol tartrate (LOPRESSOR) 25 MG tablet   Oral   Take 1 tablet (25 mg total) by mouth 2 (two) times daily.   60 tablet   1   . moxifloxacin (VIGAMOX) 0.5 % ophthalmic solution   Left Eye   Place 1 drop into the left eye 4 (four) times daily.         . Multiple Vitamin (MULTIVITAMIN WITH MINERALS) TABS   Oral   Take 1 tablet by mouth daily.         Marland Kitchen omeprazole (PRILOSEC) 20 MG capsule   Oral   Take 20 mg by mouth  daily.         . potassium chloride (K-DUR) 10 MEQ tablet   Oral   Take 1 tablet (10 mEq total) by mouth 2 (two) times daily. 11/21/11 last dose.   60 tablet   1   . pravastatin (PRAVACHOL) 40 MG tablet   Oral   Take 1 tablet (40 mg total) by mouth at bedtime.   30 tablet   1   . glipiZIDE (GLUCOTROL) 5 MG tablet   Oral   Take 1 tablet (5 mg total) by mouth 2 (two) times daily before a meal.   60 tablet   1   . nitroGLYCERIN (NITROSTAT) 0.4 MG SL tablet   Sublingual   Place 1 tablet (0.4 mg total) under the tongue every 5 (five) minutes as needed for chest pain.   25 tablet   1     BP 174/91  Pulse 94  Temp(Src) 98.4 F (36.9 C) (Oral)  Resp 19  SpO2 100%  Physical Exam  Nursing note and vitals reviewed. Constitutional: She is oriented to person, place, and time. She appears well-developed and well-nourished. She appears distressed.   HENT:  Head: Normocephalic and atraumatic.  Mouth/Throat: Oropharynx is clear and moist.  Eyes: Conjunctivae and EOM are normal. Pupils are equal, round, and reactive to light.  Neck: Normal range of motion.  Cardiovascular: Regular rhythm and normal heart sounds.   No murmur heard. Tachycardic.  Pulmonary/Chest: She is in respiratory distress. She has no wheezes. She has rales.  Abdominal: Soft. Bowel sounds are normal. There is no tenderness.  Musculoskeletal: She exhibits edema.  Neurological: She is alert and oriented to person, place, and time. No cranial nerve deficit. She exhibits normal muscle tone. Coordination normal.  Skin: Skin is warm. No rash noted.    ED Course  Procedures (including critical care time)  Labs Reviewed  CBC - Abnormal; Notable for the following:    RBC 5.97 (*)    MCV 76.0 (*)    MCH 24.3 (*)    RDW 19.7 (*)    All other components within normal limits  BASIC METABOLIC PANEL - Abnormal; Notable for the following:    Glucose, Bld 207 (*)    BUN 38 (*)    Creatinine, Ser 1.22 (*)    GFR calc non Af Amer 47 (*)    GFR calc Af Amer 55 (*)    All other components within normal limits  PRO B NATRIURETIC PEPTIDE - Abnormal; Notable for the following:    Pro B Natriuretic peptide (BNP) 11752.0 (*)    All other components within normal limits  TROPONIN I  POCT I-STAT TROPONIN I   Dg Chest Port 1 View  12/30/2012   *RADIOLOGY REPORT*  Clinical Data: Shortness of breath and leg edema.  PORTABLE CHEST - 1 VIEW  Comparison: 09/06/2012  Findings: There is chronic cardiomegaly. Slight pulmonary vascular congestion.  No effusions or infiltrates.  No acute osseous abnormality.  IMPRESSION: Cardiomegaly with slight pulmonary vascular congestion, unchanged or recurrent.   Original Report Authenticated By: Francene Boyers, M.D.   Results for orders placed during the hospital encounter of 12/30/12  CBC      Result Value Range   WBC 5.4  4.0 - 10.5 K/uL   RBC  5.97 (*) 3.87 - 5.11 MIL/uL   Hemoglobin 14.5  12.0 - 15.0 g/dL   HCT 40.9  81.1 - 91.4 %   MCV 76.0 (*) 78.0 - 100.0 fL   MCH 24.3 (*)  26.0 - 34.0 pg   MCHC 31.9  30.0 - 36.0 g/dL   RDW 16.1 (*) 09.6 - 04.5 %   Platelets 245  150 - 400 K/uL  BASIC METABOLIC PANEL      Result Value Range   Sodium 135  135 - 145 mEq/L   Potassium 4.2  3.5 - 5.1 mEq/L   Chloride 102  96 - 112 mEq/L   CO2 22  19 - 32 mEq/L   Glucose, Bld 207 (*) 70 - 99 mg/dL   BUN 38 (*) 6 - 23 mg/dL   Creatinine, Ser 4.09 (*) 0.50 - 1.10 mg/dL   Calcium 8.6  8.4 - 81.1 mg/dL   GFR calc non Af Amer 47 (*) >90 mL/min   GFR calc Af Amer 55 (*) >90 mL/min  PRO B NATRIURETIC PEPTIDE      Result Value Range   Pro B Natriuretic peptide (BNP) 11752.0 (*) 0 - 125 pg/mL  TROPONIN I      Result Value Range   Troponin I <0.30  <0.30 ng/mL  POCT I-STAT TROPONIN I      Result Value Range   Troponin i, poc 0.01  0.00 - 0.08 ng/mL   Comment 3             Date: 12/30/2012  Rate: 109  Rhythm: sinus tachycardia  QRS Axis: right  Intervals: normal  ST/T Wave abnormalities: nonspecific ST/T changes  Conduction Disutrbances:none  Narrative Interpretation:   Old EKG Reviewed: none available    1. Hypertensive CHF (congestive heart failure)     CRITICAL CARE Performed by: Shelda Jakes. Total critical care time: 30 Critical care time was exclusive of separately billable procedures and treating other patients. Critical care was necessary to treat or prevent imminent or life-threatening deterioration. Critical care was time spent personally by me on the following activities: development of treatment plan with patient and/or surrogate as well as nursing, discussions with consultants, evaluation of patient's response to treatment, examination of patient, obtaining history from patient or surrogate, ordering and performing treatments and interventions, ordering and review of laboratory studies, ordering and review of  radiographic studies, pulse oximetry and re-evaluation of patient's condition.   MDM  The patient presented with acute shortness of breath and marked hypertension with a blood pressure 217/129 consistent with a hypertensive emergency 2 probable congestive heart failure. Patient was given 80 mg of Lasix and started on nitroglycerin drip to bring the blood pressure down. Patient improved with those treatments. Chest x-ray showed no significant pulmonary edema but there was some trace pulmonary edema. Patient followed by cardiology person covering for her cardiologist [name] is [name], he will come in and admit for overnight observation and to get the blood pressure under further control. Patient had not taken her blood pressure medicine for the past 2 days. Even know patient improved with treatment in the ED nitroglycerin can probably be stopped and patient can be started up on her oral hypertensive meds to make sure that her blood pressure stabilizes and she doesn't go back into flash pulmonary edema or shortness of breath.  Patient has a known history of hypertension and congestive heart failure. Patient is present in the past and respiratory distress. Today she came in early does when she started to get significantly short of breath. Patient's heart rate also on arrival was 122 EKG shows a sinus tachycardia. Respiratory rate was about 20.       Shelda Jakes, MD 12/30/12 2159

## 2012-12-30 NOTE — ED Notes (Signed)
Pt out put 100. 6:55pm JG.

## 2012-12-30 NOTE — ED Notes (Signed)
Pt presents to ED for evaluation of shortness of breath that started today, lung sounds clear bilaterally.  Pt states she has had bilateral leg swelling for the past week, hx of CHF, pt takes Lasix at home.  States she has been taking mediation as prescribed.  Denies chest pain.

## 2012-12-30 NOTE — ED Notes (Signed)
Pt reports B/l leg swelling x 1 week. Pt is now experiencing shortness of breath onset today. Pt denies CP.

## 2012-12-30 NOTE — H&P (Signed)
[name] is an 62 y.o. female.   Chief Complaint: Progressive shortness of breath associated with leg swelling HPI: Patient is 62 year old female with past medical history significant for coronary artery disease status post anteroseptal wall myocardial infarction with occlusion of distal LAD in the recent past treated medically, status post PTCA stenting to proximal LAD in May of 2013, ischemic cardiomyopathy, history of congestive heart failure secondary to systolic dysfunction EF approximately 25-30%, hypertension, diabetes mellitus, hypercholesteremia, COPD, came to the ER complaining of progressive increasing shortness of breath associated with leg swelling for one week states her breathing got worse today so decided to come to the ER. Patient states she ran out of a few blood pressure medicines. Denies any chest pain nausea vomiting diaphoresis. Patient does history of PND orthopnea and leg swelling. Denies any palpitation lightheadedness or syncope. Denies any excessive salty food intake. Denies any cough fever chills. Denies any urinary complaints.  Past Medical History  Diagnosis Date  . Hypertension   . Diabetes mellitus   . Heart failure   . Coronary artery disease   . Stented coronary artery     History reviewed. No pertinent past surgical history.  No family history on file. Social History:  reports that she has never smoked. She does not have any smokeless tobacco history on file. She reports that she does not drink alcohol or use illicit drugs.  Allergies: No Known Allergies   (Not in a hospital admission)  Results for orders placed during the hospital encounter of 12/30/12 (from the past 48 hour(s))  CBC     Status: Abnormal   Collection Time    12/30/12  4:56 PM      Result Value Range   WBC 5.4  4.0 - 10.5 K/uL   RBC 5.97 (*) 3.87 - 5.11 MIL/uL   Hemoglobin 14.5  12.0 - 15.0 g/dL   HCT 47.8  29.5 - 62.1 %   MCV 76.0 (*) 78.0 - 100.0 fL   MCH 24.3 (*) 26.0 -  34.0 pg   MCHC 31.9  30.0 - 36.0 g/dL   RDW 30.8 (*) 65.7 - 84.6 %   Platelets 245  150 - 400 K/uL  BASIC METABOLIC PANEL     Status: Abnormal   Collection Time    12/30/12  4:56 PM      Result Value Range   Sodium 135  135 - 145 mEq/L   Potassium 4.2  3.5 - 5.1 mEq/L   Chloride 102  96 - 112 mEq/L   CO2 22  19 - 32 mEq/L   Glucose, Bld 207 (*) 70 - 99 mg/dL   BUN 38 (*) 6 - 23 mg/dL   Creatinine, Ser 9.62 (*) 0.50 - 1.10 mg/dL   Calcium 8.6  8.4 - 95.2 mg/dL   GFR calc non Af Amer 47 (*) >90 mL/min   GFR calc Af Amer 55 (*) >90 mL/min   Comment:            The eGFR has been calculated     using the CKD EPI equation.     This calculation has not been     validated in all clinical     situations.     eGFR's persistently     <90 mL/min signify     possible Chronic Kidney Disease.  PRO B NATRIURETIC PEPTIDE     Status: Abnormal   Collection Time    12/30/12  4:56 PM      Result  Value Range   Pro B Natriuretic peptide (BNP) 11752.0 (*) 0 - 125 pg/mL  POCT I-STAT TROPONIN I     Status: None   Collection Time    12/30/12  5:11 PM      Result Value Range   Troponin i, poc 0.01  0.00 - 0.08 ng/mL   Comment 3            Comment: Due to the release kinetics of cTnI,     a negative result within the first hours     of the onset of symptoms does not rule out     myocardial infarction with certainty.     If myocardial infarction is still suspected,     repeat the test at appropriate intervals.  TROPONIN I     Status: None   Collection Time    12/30/12  8:35 PM      Result Value Range   Troponin I <0.30  <0.30 ng/mL   Comment:            Due to the release kinetics of cTnI,     a negative result within the first hours     of the onset of symptoms does not rule out     myocardial infarction with certainty.     If myocardial infarction is still suspected,     repeat the test at appropriate intervals.   Dg Chest Port 1 View  12/30/2012   *RADIOLOGY REPORT*  Clinical Data:  Shortness of breath and leg edema.  PORTABLE CHEST - 1 VIEW  Comparison: 09/06/2012  Findings: There is chronic cardiomegaly. Slight pulmonary vascular congestion.  No effusions or infiltrates.  No acute osseous abnormality.  IMPRESSION: Cardiomegaly with slight pulmonary vascular congestion, unchanged or recurrent.   Original Report Authenticated By: Francene Boyers, M.D.    Review of Systems  Constitutional: Negative for fever and chills.  Eyes: Negative for blurred vision and double vision.  Respiratory: Positive for shortness of breath. Negative for cough, hemoptysis and sputum production.   Cardiovascular: Positive for chest pain, orthopnea, leg swelling and PND. Negative for palpitations and claudication.  Gastrointestinal: Negative for nausea, vomiting and abdominal pain.  Genitourinary: Negative for dysuria.  Neurological: Negative for dizziness and headaches.    Blood pressure 174/91, pulse 94, temperature 98.4 F (36.9 C), temperature source Oral, resp. rate 19, SpO2 100.00%. Physical Exam  Constitutional: She is oriented to person, place, and time.  HENT:  Head: Normocephalic and atraumatic.  Eyes: Conjunctivae are normal. Left eye exhibits discharge. No scleral icterus.  Neck: Neck supple. JVD present. No tracheal deviation present. No thyromegaly present.  Cardiovascular: Exam reveals no friction rub.   Regular rate and rhythm S1-S2 normal there is soft systolic murmur and S3 gallop noted  Respiratory:  Decreased breath sound at bases with bibasilar rales  GI: Soft. Bowel sounds are normal. She exhibits no distension. There is no tenderness. There is no rebound.  Musculoskeletal:  No clubbing cyanosis 4+ edema noted  Neurological: She is alert and oriented to person, place, and time.     Assessment/Plan Decompensated acute systolic heart failure secondary to noncompliance to medication rule out ischemia Uncontrolled hypertension second to noncompliance to  medication Ischemic cardiomyopathy Coronary artery disease status post anteroseptal wall MI history of PCI to proximal LAD in the past Diabetes mellitus Hypercholesteremia COPD Chronic kidney disease stage II Plan As per orders [name] to follow from a.m.  Robynn Pane 12/30/2012, 9:49 PM

## 2012-12-31 ENCOUNTER — Encounter (HOSPITAL_COMMUNITY): Payer: Self-pay | Admitting: *Deleted

## 2012-12-31 LAB — COMPREHENSIVE METABOLIC PANEL
AST: 21 U/L (ref 0–37)
Albumin: 2.9 g/dL — ABNORMAL LOW (ref 3.5–5.2)
BUN: 35 mg/dL — ABNORMAL HIGH (ref 6–23)
Calcium: 8.7 mg/dL (ref 8.4–10.5)
Creatinine, Ser: 1.15 mg/dL — ABNORMAL HIGH (ref 0.50–1.10)
GFR calc non Af Amer: 51 mL/min — ABNORMAL LOW (ref >90)

## 2012-12-31 LAB — CBC WITH DIFFERENTIAL/PLATELET
Basophils Absolute: 0 10*3/uL (ref 0.0–0.1)
Basophils Relative: 0 % (ref 0–1)
Eosinophils Absolute: 0.1 10*3/uL (ref 0.0–0.7)
Eosinophils Relative: 1 % (ref 0–5)
HCT: 42.6 % (ref 36.0–46.0)
MCH: 25.3 pg — ABNORMAL LOW (ref 26.0–34.0)
MCHC: 33.3 g/dL (ref 30.0–36.0)
MCV: 75.8 fL — ABNORMAL LOW (ref 78.0–100.0)
Monocytes Absolute: 0.6 10*3/uL (ref 0.1–1.0)
RDW: 19.3 % — ABNORMAL HIGH (ref 11.5–15.5)

## 2012-12-31 LAB — APTT: aPTT: 34 seconds (ref 24–37)

## 2012-12-31 LAB — PROTIME-INR
INR: 1.1 (ref 0.00–1.49)
Prothrombin Time: 14.1 seconds (ref 11.6–15.2)

## 2012-12-31 LAB — TROPONIN I
Troponin I: <0.3 ng/mL (ref <0.30)
Troponin I: <0.3 ng/mL (ref <0.30)

## 2012-12-31 LAB — BASIC METABOLIC PANEL
BUN: 35 mg/dL — ABNORMAL HIGH (ref 6–23)
CO2: 23 mEq/L (ref 19–32)
Chloride: 103 mEq/L (ref 96–112)
Creatinine, Ser: 1.06 mg/dL (ref 0.50–1.10)
GFR calc Af Amer: 65 mL/min — ABNORMAL LOW (ref >90)
Glucose, Bld: 122 mg/dL — ABNORMAL HIGH (ref 70–99)

## 2012-12-31 LAB — GLUCOSE, CAPILLARY
Glucose-Capillary: 76 mg/dL (ref 70–99)
Glucose-Capillary: 88 mg/dL (ref 70–99)

## 2012-12-31 LAB — TSH: TSH: 3.973 u[IU]/mL (ref 0.350–4.500)

## 2012-12-31 LAB — HEMOGLOBIN A1C
Hgb A1c MFr Bld: 7.5 % — ABNORMAL HIGH (ref <5.7)
Mean Plasma Glucose: 169 mg/dL — ABNORMAL HIGH (ref <117)

## 2012-12-31 MED ORDER — SODIUM CHLORIDE 0.9 % IJ SOLN: 3.0000 mL | INTRAMUSCULAR | Status: DC | PRN

## 2012-12-31 MED ORDER — NITROGLYCERIN 0.4 MG SL SUBL: 0.4000 mg | SUBLINGUAL_TABLET | SUBLINGUAL | Status: DC | PRN

## 2012-12-31 MED ORDER — SODIUM CHLORIDE 0.9 % IJ SOLN
3.0000 mL | Freq: Two times a day (BID) | INTRAMUSCULAR | Status: DC
  Administered 2013-01-01 – 2013-01-02 (×4): 3 mL via INTRAVENOUS

## 2012-12-31 MED ORDER — INSULIN ASPART 100 UNIT/ML ~~LOC~~ SOLN
0.0000 [IU] | Freq: Three times a day (TID) | SUBCUTANEOUS | Status: DC
  Administered 2012-12-31: 2 [IU] via SUBCUTANEOUS
  Administered 2013-01-01: 13:00:00 via SUBCUTANEOUS
  Administered 2013-01-02: 1 [IU] via SUBCUTANEOUS

## 2012-12-31 MED ORDER — POTASSIUM CHLORIDE CRYS ER 20 MEQ PO TBCR
20.0000 meq | EXTENDED_RELEASE_TABLET | Freq: Two times a day (BID) | ORAL | Status: DC
  Administered 2012-12-31: 20 meq via ORAL
  Filled 2012-12-31 (×3): qty 1

## 2012-12-31 MED ORDER — PANTOPRAZOLE SODIUM 40 MG PO TBEC
40.0000 mg | DELAYED_RELEASE_TABLET | Freq: Every day | ORAL | Status: DC
  Administered 2012-12-31 – 2013-01-02 (×3): 40 mg via ORAL
  Filled 2012-12-31 (×3): qty 1

## 2012-12-31 MED ORDER — HEPARIN SODIUM (PORCINE) 5000 UNIT/ML IJ SOLN
5000.0000 [IU] | Freq: Three times a day (TID) | INTRAMUSCULAR | Status: DC
  Administered 2012-12-31 – 2013-01-03 (×10): 5000 [IU] via SUBCUTANEOUS
  Filled 2012-12-31 (×13): qty 1

## 2012-12-31 MED ORDER — FUROSEMIDE 10 MG/ML IJ SOLN
40.0000 mg | Freq: Two times a day (BID) | INTRAMUSCULAR | Status: DC
  Administered 2012-12-31 – 2013-01-03 (×6): 40 mg via INTRAVENOUS
  Filled 2012-12-31 (×7): qty 4

## 2012-12-31 MED ORDER — ONDANSETRON HCL 4 MG/2ML IJ SOLN: 4.0000 mg | Freq: Four times a day (QID) | INTRAMUSCULAR | Status: DC | PRN

## 2012-12-31 MED ORDER — ISOSORB DINITRATE-HYDRALAZINE 20-37.5 MG PO TABS
1.0000 | ORAL_TABLET | Freq: Three times a day (TID) | ORAL | Status: DC
  Administered 2012-12-31 – 2013-01-02 (×9): 1 via ORAL
  Filled 2012-12-31 (×12): qty 1

## 2012-12-31 MED ORDER — FUROSEMIDE 10 MG/ML IJ SOLN
40.0000 mg | Freq: Two times a day (BID) | INTRAMUSCULAR | Status: DC
  Administered 2012-12-31 (×2): 40 mg via INTRAVENOUS
  Filled 2012-12-31 (×3): qty 4

## 2012-12-31 MED ORDER — SODIUM CHLORIDE 0.9 % IV SOLN: 250.0000 mL | INTRAVENOUS | Status: DC | PRN

## 2012-12-31 MED ORDER — ACETAMINOPHEN 325 MG PO TABS: 650.0000 mg | ORAL_TABLET | ORAL | Status: DC | PRN

## 2012-12-31 MED ORDER — CARVEDILOL 6.25 MG PO TABS
6.2500 mg | ORAL_TABLET | Freq: Two times a day (BID) | ORAL | Status: DC
  Administered 2012-12-31 – 2013-01-03 (×7): 6.25 mg via ORAL
  Filled 2012-12-31 (×9): qty 1

## 2012-12-31 MED ORDER — ASPIRIN EC 81 MG PO TBEC
81.0000 mg | DELAYED_RELEASE_TABLET | Freq: Every day | ORAL | Status: DC
  Administered 2012-12-31 – 2013-01-02 (×3): 81 mg via ORAL
  Filled 2012-12-31 (×4): qty 1

## 2012-12-31 MED ORDER — CLONIDINE HCL 0.2 MG PO TABS
0.2000 mg | ORAL_TABLET | Freq: Every day | ORAL | Status: DC
  Administered 2012-12-31 – 2013-01-02 (×4): 0.2 mg via ORAL
  Filled 2012-12-31 (×5): qty 1

## 2012-12-31 MED ORDER — RAMIPRIL 5 MG PO CAPS
5.0000 mg | ORAL_CAPSULE | Freq: Two times a day (BID) | ORAL | Status: DC
  Administered 2012-12-31 – 2013-01-02 (×7): 5 mg via ORAL
  Filled 2012-12-31 (×9): qty 1

## 2012-12-31 MED ORDER — GLIPIZIDE 5 MG PO TABS
5.0000 mg | ORAL_TABLET | Freq: Two times a day (BID) | ORAL | Status: DC
  Administered 2012-12-31 – 2013-01-03 (×6): 5 mg via ORAL
  Filled 2012-12-31 (×9): qty 1

## 2012-12-31 MED ORDER — POTASSIUM CHLORIDE CRYS ER 20 MEQ PO TBCR
40.0000 meq | EXTENDED_RELEASE_TABLET | Freq: Three times a day (TID) | ORAL | Status: DC
  Administered 2012-12-31 (×3): 40 meq via ORAL
  Filled 2012-12-31 (×5): qty 2

## 2012-12-31 MED ORDER — SODIUM CHLORIDE 0.9 % IV SOLN: INTRAVENOUS | Status: DC

## 2012-12-31 MED ORDER — NITROGLYCERIN IN D5W 200-5 MCG/ML-% IV SOLN: 20.0000 ug/min | INTRAVENOUS | Status: DC

## 2012-12-31 NOTE — Care Management Note (Signed)
    Page 1 of 1   12/31/2012     10:58:45 AM   CARE MANAGEMENT NOTE 12/31/2012  Patient:  [name], [name]   Account Number:  [id number]  Date Initiated:  12/31/2012  Documentation initiated by:  Junius Creamer  Subjective/Objective Assessment:   adm w heart failure     Action/Plan:   lives w husband, pcp dr Algie Coffer, hx of adv homecare   Anticipated DC Date:     Anticipated DC Plan:  HOME W HOME HEALTH SERVICES      DC Planning Services  CM consult      Choice offered to / List presented to:             Status of service:   Medicare Important Message given?   (If response is "NO", the following Medicare IM given date fields will be blank) Date Medicare IM given:   Date Additional Medicare IM given:    Discharge Disposition:    Per UR Regulation:  Reviewed for med. necessity/level of care/duration of stay  If discussed at Long Length of Stay Meetings, dates discussed:    Comments:  6/23 1057a debbie Donato Studley rn,bsn will follow for dc planning needs as pt progresses.

## 2012-12-31 NOTE — Progress Notes (Signed)
Subjective:  Feeling better. No shortness of breath at rest. Admits to holding lasix as she went back to work.  Objective:  Vital Signs in the last 24 hours: Temp:  [97 F (36.1 C)-98.8 F (37.1 C)] 97 F (36.1 C) (06/23 0830) Pulse Rate:  [66-122] 80 (06/23 0830) Cardiac Rhythm:  [-] Normal sinus rhythm (06/23 0830) Resp:  [18-27] 22 (06/23 0830) BP: (119-217)/(67-129) 148/87 mmHg (06/23 0830) SpO2:  [97 %-100 %] 100 % (06/23 0830) Weight:  [75.8 kg (167 lb 1.7 oz)-77.3 kg (170 lb 6.7 oz)] 75.8 kg (167 lb 1.7 oz) (06/23 0600)  Physical Exam: BP Readings from Last 1 Encounters:  12/31/12 148/87     Wt Readings from Last 1 Encounters:  12/31/12 75.8 kg (167 lb 1.7 oz)    Weight change:   HEENT: [place]/AT, Eyes-Brown, PERL, EOMI, Conjunctiva-Pink, Sclera-Non-icteric Neck: Full JVD, No bruit, Trachea midline. Lungs:  Clear, Bilateral. Cardiac:  Regular rhythm, normal S1 and S2, no S3.  Abdomen:  Soft, non-tender. Extremities:  2 + edema present. No cyanosis. No clubbing. CNS: AxOx3, Cranial nerves grossly intact, moves all 4 extremities. Right handed. Skin: Warm and dry.   Intake/Output from previous day: 06/22 0701 - 06/23 0700 In: 162 [I.V.:162] Out: 2700 [Urine:2700]    Lab Results: BMET    Component Value Date/Time   NA 136 12/31/2012 0525   K 3.4* 12/31/2012 0525   CL 103 12/31/2012 0525   CO2 23 12/31/2012 0525   GLUCOSE 122* 12/31/2012 0525   BUN 35* 12/31/2012 0525   CREATININE 1.06 12/31/2012 0525   CALCIUM 8.5 12/31/2012 0525   GFRNONAA 56* 12/31/2012 0525   GFRAA 65* 12/31/2012 0525   CBC    Component Value Date/Time   WBC 5.6 12/31/2012 0008   RBC 5.62* 12/31/2012 0008   HGB 14.2 12/31/2012 0008   HCT 42.6 12/31/2012 0008   PLT 236 12/31/2012 0008   MCV 75.8* 12/31/2012 0008   MCH 25.3* 12/31/2012 0008   MCHC 33.3 12/31/2012 0008   RDW 19.3* 12/31/2012 0008   LYMPHSABS 1.4 12/31/2012 0008   MONOABS 0.6 12/31/2012 0008   EOSABS 0.1 12/31/2012 0008   BASOSABS 0.0  12/31/2012 0008   CARDIAC ENZYMES Lab Results  Component Value Date   CKTOTAL 59 09/07/2012   CKMB 3.2 09/07/2012   TROPONINI <0.30 12/31/2012    Scheduled Meds: . aspirin EC  81 mg Oral Daily  . carvedilol  6.25 mg Oral BID WC  . cloNIDine  0.2 mg Oral QHS  . furosemide  40 mg Intravenous Q12H  . glipiZIDE  5 mg Oral BID AC  . heparin  5,000 Units Subcutaneous Q8H  . insulin aspart  0-9 Units Subcutaneous TID WC  . isosorbide-hydrALAZINE  1 tablet Oral TID  . pantoprazole  40 mg Oral Daily  . potassium chloride  40 mEq Oral TID  . ramipril  5 mg Oral Q12H  . sodium chloride  3 mL Intravenous Q12H   Continuous Infusions: . sodium chloride 1,000 mL (12/31/12 0026)  . nitroGLYCERIN 20 mcg/min (12/31/12 0800)   PRN Meds:.sodium chloride, acetaminophen, nitroGLYCERIN, ondansetron (ZOFRAN) IV, sodium chloride  Assessment/Plan: Decompensated acute systolic heart failure secondary to noncompliance to medication rule out ischemia  Uncontrolled hypertension second to noncompliance to medication  Ischemic cardiomyopathy  Coronary artery disease status post anteroseptal wall MI history of PCI to proximal LAD in the past  Diabetes mellitus  Hypercholesteremia  COPD  Chronic kidney disease stage II  Continue diuresis. Increase potassium  supplementation.   LOS: 1 day    [name]  MD  12/31/2012, 8:53 AM

## 2013-01-01 LAB — GLUCOSE, CAPILLARY
Glucose-Capillary: 65 mg/dL — ABNORMAL LOW (ref 70–99)
Glucose-Capillary: 133 mg/dL — ABNORMAL HIGH (ref 70–99)
Glucose-Capillary: 143 mg/dL — ABNORMAL HIGH (ref 70–99)

## 2013-01-01 LAB — BASIC METABOLIC PANEL
BUN: 39 mg/dL — ABNORMAL HIGH (ref 6–23)
CO2: 22 mEq/L (ref 19–32)
Calcium: 8.1 mg/dL — ABNORMAL LOW (ref 8.4–10.5)
Creatinine, Ser: 1.29 mg/dL — ABNORMAL HIGH (ref 0.50–1.10)

## 2013-01-01 MED ORDER — METOLAZONE 2.5 MG PO TABS
2.5000 mg | ORAL_TABLET | Freq: Once | ORAL | Status: AC
  Administered 2013-01-01: 2.5 mg via ORAL
  Filled 2013-01-01: qty 1

## 2013-01-01 MED ORDER — POTASSIUM CHLORIDE CRYS ER 20 MEQ PO TBCR
20.0000 meq | EXTENDED_RELEASE_TABLET | Freq: Every day | ORAL | Status: DC
  Administered 2013-01-01: 20 meq via ORAL
  Filled 2013-01-01: qty 1

## 2013-01-01 MED ORDER — RESOURCE THICKENUP CLEAR PO POWD: ORAL | Status: DC | PRN

## 2013-01-02 LAB — GLUCOSE, CAPILLARY
Glucose-Capillary: 101 mg/dL — ABNORMAL HIGH (ref 70–99)
Glucose-Capillary: 154 mg/dL — ABNORMAL HIGH (ref 70–99)

## 2013-01-02 LAB — BASIC METABOLIC PANEL
BUN: 38 mg/dL — ABNORMAL HIGH (ref 6–23)
CO2: 25 mEq/L (ref 19–32)
Calcium: 8.3 mg/dL — ABNORMAL LOW (ref 8.4–10.5)
Chloride: 101 mEq/L (ref 96–112)
Creatinine, Ser: 1.28 mg/dL — ABNORMAL HIGH (ref 0.50–1.10)
GFR calc Af Amer: 52 mL/min — ABNORMAL LOW (ref >90)

## 2013-01-02 MED ORDER — POTASSIUM CHLORIDE CRYS ER 20 MEQ PO TBCR
20.0000 meq | EXTENDED_RELEASE_TABLET | Freq: Two times a day (BID) | ORAL | Status: DC
  Administered 2013-01-02 (×2): 20 meq via ORAL
  Filled 2013-01-02 (×3): qty 1

## 2013-01-02 NOTE — Progress Notes (Signed)
*  PRELIMINARY RESULTS* Echocardiogram 2D Echocardiogram has been performed.  Jeryl Columbia 01/02/2013, 3:51 PM

## 2013-01-03 MED ORDER — RAMIPRIL 5 MG PO CAPS: 5.0000 mg | ORAL_CAPSULE | Freq: Two times a day (BID) | ORAL | Status: DC

## 2013-01-03 NOTE — Progress Notes (Signed)
Subjective:  Feeling better.  Objective:  Vital Signs in the last 24 hours: Temp:  [97.7 F (36.5 C)-98.5 F (36.9 C)] 98 F (36.7 C) (06/26 0823) Pulse Rate:  [68-70] 63 (06/24 0810) Cardiac Rhythm:  [-] Normal sinus rhythm (06/24 0810) BP: (105-133)/(50-68) 105/63 mmHg (06/24 0823) SpO2:  [99 %-100 %] 98 % (06/24 0823) Weight:  [72.258 kg (159 lb 4.8 oz)] 76.4 kg (159 lb 4.8 oz) (06/24 0500)  Physical Exam: BP Readings from Last 1 Encounters:  01/03/13 133/66     Wt Readings from Last 1 Encounters:  01/03/13 72.258 kg (159 lb 4.8 oz)    Weight change: -1.842 kg (-4 lb 1 oz)  HEENT: [place]/AT, Eyes-Brown, PERL, EOMI, Conjunctiva-Pink, Sclera-Non-icteric Neck: No JVD, No bruit, Trachea midline. Lungs:  Clear, Bilateral. Cardiac:  Regular rhythm, normal S1 and S2, no S3.  Abdomen:  Soft, non-tender. Extremities:  2 + edema present. No cyanosis. No clubbing. CNS: AxOx3, Cranial nerves grossly intact, moves all 4 extremities. Right handed. Skin: Warm and dry.   Intake/Output from previous day: 06/25 0701 - 06/26 0700 In: 703 [P.O.:700; I.V.:3] Out: 3600 [Urine:3600]    Lab Results: BMET    Component Value Date/Time   NA 137 01/02/2013 0435   K 3.7 01/02/2013 0435   CL 101 01/02/2013 0435   CO2 25 01/02/2013 0435   GLUCOSE 93 01/02/2013 0435   BUN 38* 01/02/2013 0435   CREATININE 1.28* 01/02/2013 0435   CALCIUM 8.3* 01/02/2013 0435   GFRNONAA 45* 01/02/2013 0435   GFRAA 52* 01/02/2013 0435   CBC    Component Value Date/Time   WBC 5.6 12/31/2012 0008   RBC 5.62* 12/31/2012 0008   HGB 14.2 12/31/2012 0008   HCT 42.6 12/31/2012 0008   PLT 236 12/31/2012 0008   MCV 75.8* 12/31/2012 0008   MCH 25.3* 12/31/2012 0008   MCHC 33.3 12/31/2012 0008   RDW 19.3* 12/31/2012 0008   LYMPHSABS 1.4 12/31/2012 0008   MONOABS 0.6 12/31/2012 0008   EOSABS 0.1 12/31/2012 0008   BASOSABS 0.0 12/31/2012 0008   CARDIAC ENZYMES Lab Results  Component Value Date   CKTOTAL 59 09/07/2012   CKMB 3.2  09/07/2012   TROPONINI <0.30 12/31/2012    Scheduled Meds: . aspirin EC  81 mg Oral Daily  . carvedilol  6.25 mg Oral BID WC  . cloNIDine  0.2 mg Oral QHS  . furosemide  40 mg Intravenous BID  . glipiZIDE  5 mg Oral BID AC  . heparin  5,000 Units Subcutaneous Q8H  . insulin aspart  0-9 Units Subcutaneous TID WC  . isosorbide-hydrALAZINE  1 tablet Oral TID  . pantoprazole  40 mg Oral Daily  . potassium chloride  20 mEq Oral BID  . ramipril  5 mg Oral Q12H  . sodium chloride  3 mL Intravenous Q12H   Continuous Infusions: . sodium chloride Stopped (01/01/13 0700)  . nitroGLYCERIN Stopped (01/01/13 0700)   PRN Meds:.sodium chloride, acetaminophen, nitroGLYCERIN, ondansetron (ZOFRAN) IV, sodium chloride  Assessment/Plan: Decompensated acute systolic heart failure secondary to noncompliance to medication rule out ischemia  Uncontrolled hypertension second to noncompliance to medication  Ischemic cardiomyopathy  Coronary artery disease status post anteroseptal wall MI history of PCI to proximal LAD in the past  Diabetes mellitus  Hypercholesteremia  COPD  Chronic kidney disease stage II  Continue diuresis. Add metolazone.   LOS: 2 days    [name]  MD  01/03/2013, 10:47 AM

## 2013-01-03 NOTE — Progress Notes (Signed)
Pt reports bleeding at heparin injection site on abdomen. Pt states she changes gown r/t bleeding. bandaid placed by previous rn. Currently not bleeding. Will continue to monitor and advise attending as needed.

## 2013-01-03 NOTE — Progress Notes (Signed)
Subjective:  Slow and steady diuresis.  Objective:  Vital Signs in the last 24 hours: Temp:  [97.7 F (36.5 C)-98 F (36.9 C)] 98 F (36.7 C) (06/25 0822) Pulse Rate:  [68-70] 65 (06/25 0340) Cardiac Rhythm:  [-] Normal sinus rhythm (06/25 0340) BP: (105-133)/(50-68) 134/66 mmHg (06/25 0822) SpO2:  [99 %-100 %] 98 % (06/25 0822) Weight:  [72.258 kg (159 lb 4.8 oz)] 74.1 kg (159 lb 4.8 oz) (06/25 0500)  Physical Exam: BP Readings from Last 1 Encounters:  01/03/13 133/66     Wt Readings from Last 1 Encounters:  01/03/13 72.258 kg (159 lb 4.8 oz)    Weight change: -1.842 kg (-4 lb 1 oz)  HEENT: [place]/AT, Eyes-Brown, PERL, EOMI, Conjunctiva-Pink, Sclera-Non-icteric Neck: + JVD, No bruit, Trachea midline. Lungs:  Clear, Bilateral. Cardiac:  Regular rhythm, normal S1 and S2, no S3.  Abdomen:  Soft, non-tender. Extremities:  2+ edema present. No cyanosis. No clubbing. CNS: AxOx3, Cranial nerves grossly intact, moves all 4 extremities. Right handed. Skin: Warm and dry.   Intake/Output from previous day: 06/25 0701 - 06/26 0700 In: 703 [P.O.:700; I.V.:3] Out: 3600 [Urine:3600]    Lab Results: BMET    Component Value Date/Time   NA 137 01/02/2013 0435   K 3.7 01/02/2013 0435   CL 101 01/02/2013 0435   CO2 25 01/02/2013 0435   GLUCOSE 93 01/02/2013 0435   BUN 38* 01/02/2013 0435   CREATININE 1.28* 01/02/2013 0435   CALCIUM 8.3* 01/02/2013 0435   GFRNONAA 45* 01/02/2013 0435   GFRAA 52* 01/02/2013 0435   CBC    Component Value Date/Time   WBC 5.6 12/31/2012 0008   RBC 5.62* 12/31/2012 0008   HGB 14.2 12/31/2012 0008   HCT 42.6 12/31/2012 0008   PLT 236 12/31/2012 0008   MCV 75.8* 12/31/2012 0008   MCH 25.3* 12/31/2012 0008   MCHC 33.3 12/31/2012 0008   RDW 19.3* 12/31/2012 0008   LYMPHSABS 1.4 12/31/2012 0008   MONOABS 0.6 12/31/2012 0008   EOSABS 0.1 12/31/2012 0008   BASOSABS 0.0 12/31/2012 0008   CARDIAC ENZYMES Lab Results  Component Value Date   CKTOTAL 59 09/07/2012   CKMB  3.2 09/07/2012   TROPONINI <0.30 12/31/2012    Scheduled Meds: . aspirin EC  81 mg Oral Daily  . carvedilol  6.25 mg Oral BID WC  . cloNIDine  0.2 mg Oral QHS  . furosemide  40 mg Intravenous BID  . glipiZIDE  5 mg Oral BID AC  . heparin  5,000 Units Subcutaneous Q8H  . insulin aspart  0-9 Units Subcutaneous TID WC  . isosorbide-hydrALAZINE  1 tablet Oral TID  . pantoprazole  40 mg Oral Daily  . potassium chloride  20 mEq Oral BID  . ramipril  5 mg Oral Q12H  . sodium chloride  3 mL Intravenous Q12H   Continuous Infusions: . sodium chloride Stopped (01/01/13 0700)  . nitroGLYCERIN Stopped (01/01/13 0700)   PRN Meds:.sodium chloride, acetaminophen, nitroGLYCERIN, ondansetron (ZOFRAN) IV, sodium chloride  Assessment/Plan: Decompensated acute systolic heart failure secondary to noncompliance to medication rule out ischemia  Uncontrolled hypertension second to noncompliance to medication  Ischemic cardiomyopathy  Coronary artery disease status post anteroseptal wall MI history of PCI to proximal LAD in the past  Diabetes mellitus  Hypercholesteremia  COPD  Chronic kidney disease stage II  Awaiting echocardiogram.   LOS: 3 days    [name]  MD  01/03/2013, 10:54 AM

## 2013-01-03 NOTE — Discharge Summary (Signed)
Physician Discharge Summary  Patient ID: [name] MRN: [id number] DOB/AGE: [birth year] 62 y.o.  Admit date: 12/30/2012 Discharge date: 01/03/2013  Admission Diagnoses: Decompensated acute systolic heart failure secondary to noncompliance to medication rule out ischemia  Uncontrolled hypertension second to noncompliance to medication  Ischemic cardiomyopathy  Coronary artery disease status post anteroseptal wall MI history of PCI to proximal LAD in the past  Diabetes mellitus  Hypercholesteremia  COPD  Chronic kidney disease stage II  Discharge Diagnoses:  Principle Problem: * Decompensated acute systolic heart failure * Noncompliance to medication  Uncontrolled hypertension second to noncompliance to medication  Ischemic cardiomyopathy  Coronary artery disease status post anteroseptal wall MI history of PCI to proximal LAD in the past  Diabetes mellitus  Hypercholesteremia  COPD  Chronic kidney disease stage II  Discharged Condition: good  Hospital Course: 62 years old female with known CAD and congestive heart failure had discontinued lasix for few weeks since she returned to her job and ended up with leg edema and shortness of breath. Slow and steady diuresis improved her condition. Her echocardiogram remained unchanged with 25-30 % EF. Ace inhibitor was added and she was dischared home with follow up in 1-2 weeks.  Consults: None  Significant Diagnostic Studies: labs: Near normal CBC and BMET except BUN/Cr of 38 and 1.22 and ProBNP of 52841.  EKG-SR, Nonspecific T wave changes.  CXR- mild vascular congestion.  Echocardiogram - Left ventricle: The cavity size was normal. There was moderate asymmetric hypertrophy of the posterior wall. Systolic function was severely reduced. The estimated ejection fraction was in the range of 25% to 30%. There is akinesis of the entireanteroseptal myocardium. There is akinesis of the entireinferior myocardium. - Aortic valve: Mild  regurgitation. - Mitral valve: Mild regurgitation. - Left atrium: The atrium was mildly dilated. - Right ventricle: The cavity size was mildly dilated. Wall thickness was normal. Systolic function was moderately reduced. - Right atrium: The atrium was mildly dilated. - Pulmonary arteries: Systolic pressure was mildly to moderately increased. PA peak pressure: 47mm Hg (S). - Pericardium, extracardiac: A trivial pericardial effusion was identified posterior to the heart.  Treatments: cardiac meds: ramipril (Altace), metoprolol, amlodipine and furosemide.  Discharge Exam: Blood pressure 133/66, pulse 69, temperature 98 F (36.7 C), temperature source Oral, resp. rate 16, height 5\' 4"  (1.626 m), weight 72.258 kg (159 lb 4.8 oz), SpO2 99.00%.  HEENT: [place]/AT, Eyes-Brown, PERL, EOMI, Conjunctiva-Pink, Sclera-Non-icteric  Neck: No JVD, No bruit, Trachea midline.  Lungs: Clear, Bilateral.  Cardiac: Regular rhythm, normal S1 and S2, no S3.  Abdomen: Soft, non-tender.  Extremities: Trace edema present. No cyanosis. No clubbing.  CNS: AxOx3, Cranial nerves grossly intact, moves all 4 extremities. Right handed.  Skin: Warm and dry.  Disposition: 01-Home or Self Care     Medication List    TAKE these medications       amLODipine 5 MG tablet  Commonly known as:  NORVASC  Take 1 tablet (5 mg total) by mouth daily.     aspirin EC 81 MG tablet  Take 81 mg by mouth daily.     cloNIDine 0.2 MG tablet  Commonly known as:  CATAPRES  Take 1 tablet (0.2 mg total) by mouth at bedtime.     furosemide 40 MG tablet  Commonly known as:  LASIX  Take 2 tablets (80 mg total) by mouth daily.     glipiZIDE 5 MG tablet  Commonly known as:  GLUCOTROL  Take 1 tablet (5 mg total)  by mouth 2 (two) times daily before a meal.     isosorbide-hydrALAZINE 20-37.5 MG per tablet  Commonly known as:  BIDIL  Take 1 tablet by mouth 3 (three) times daily.     metoprolol tartrate 25 MG tablet  Commonly known as:   LOPRESSOR  Take 1 tablet (25 mg total) by mouth 2 (two) times daily.     moxifloxacin 0.5 % ophthalmic solution  Commonly known as:  VIGAMOX  Place 1 drop into the left eye 4 (four) times daily.     multivitamin with minerals Tabs  Take 1 tablet by mouth daily.     nitroGLYCERIN 0.4 MG SL tablet  Commonly known as:  NITROSTAT  Place 1 tablet (0.4 mg total) under the tongue every 5 (five) minutes as needed for chest pain.     omeprazole 20 MG capsule  Commonly known as:  PRILOSEC  Take 20 mg by mouth daily.     potassium chloride 10 MEQ tablet  Commonly known as:  K-DUR  Take 1 tablet (10 mEq total) by mouth 2 (two) times daily. 11/21/11 last dose.     pravastatin 40 MG tablet  Commonly known as:  PRAVACHOL  Take 1 tablet (40 mg total) by mouth at bedtime.     ramipril 5 MG capsule  Commonly known as:  ALTACE  Take 1 capsule (5 mg total) by mouth every 12 (twelve) hours.           Follow-up Information   Follow up with [facility], MD. Schedule an appointment as soon as possible for a visit in 2 weeks.   Contact information:   [address] [place] [address] [phone]       Signed: [name] 01/03/2013, 11:05 AM

## 2013-07-18 ENCOUNTER — Encounter (HOSPITAL_COMMUNITY): Payer: Self-pay | Admitting: Emergency Medicine

## 2013-07-18 ENCOUNTER — Inpatient Hospital Stay (HOSPITAL_COMMUNITY)
Admission: EM | Admit: 2013-07-18 | Discharge: 2013-07-25 | DRG: 291 | Disposition: A | Discharge status: Home / Self Care | Payer: No Typology Code available for payment source | Attending: Cardiovascular Disease | Admitting: Cardiovascular Disease

## 2013-07-18 ENCOUNTER — Emergency Department (HOSPITAL_COMMUNITY): Payer: No Typology Code available for payment source

## 2013-07-18 DIAGNOSIS — I129 Hypertensive chronic kidney disease with stage 1 through stage 4 chronic kidney disease, or unspecified chronic kidney disease: Secondary | ICD-10-CM | POA: Diagnosis present

## 2013-07-18 DIAGNOSIS — E1169 Type 2 diabetes mellitus with other specified complication: Secondary | ICD-10-CM | POA: Diagnosis not present

## 2013-07-18 DIAGNOSIS — I5023 Acute on chronic systolic (congestive) heart failure: Principal | ICD-10-CM | POA: Diagnosis present

## 2013-07-18 DIAGNOSIS — I38 Endocarditis, valve unspecified: Secondary | ICD-10-CM | POA: Diagnosis present

## 2013-07-18 DIAGNOSIS — I2510 Atherosclerotic heart disease of native coronary artery without angina pectoris: Secondary | ICD-10-CM | POA: Diagnosis present

## 2013-07-18 DIAGNOSIS — R7989 Other specified abnormal findings of blood chemistry: Secondary | ICD-10-CM

## 2013-07-18 DIAGNOSIS — J449 Chronic obstructive pulmonary disease, unspecified: Secondary | ICD-10-CM | POA: Diagnosis present

## 2013-07-18 DIAGNOSIS — R778 Other specified abnormalities of plasma proteins: Secondary | ICD-10-CM

## 2013-07-18 DIAGNOSIS — I509 Heart failure, unspecified: Secondary | ICD-10-CM | POA: Diagnosis present

## 2013-07-18 DIAGNOSIS — I252 Old myocardial infarction: Secondary | ICD-10-CM

## 2013-07-18 DIAGNOSIS — N183 Chronic kidney disease, stage 3 unspecified: Secondary | ICD-10-CM | POA: Diagnosis present

## 2013-07-18 DIAGNOSIS — Z7901 Long term (current) use of anticoagulants: Secondary | ICD-10-CM

## 2013-07-18 DIAGNOSIS — J4489 Other specified chronic obstructive pulmonary disease: Secondary | ICD-10-CM | POA: Diagnosis present

## 2013-07-18 DIAGNOSIS — Z9861 Coronary angioplasty status: Secondary | ICD-10-CM

## 2013-07-18 DIAGNOSIS — Z79899 Other long term (current) drug therapy: Secondary | ICD-10-CM

## 2013-07-18 DIAGNOSIS — Z7982 Long term (current) use of aspirin: Secondary | ICD-10-CM

## 2013-07-18 DIAGNOSIS — F411 Generalized anxiety disorder: Secondary | ICD-10-CM | POA: Diagnosis present

## 2013-07-18 DIAGNOSIS — I959 Hypotension, unspecified: Secondary | ICD-10-CM | POA: Diagnosis present

## 2013-07-18 DIAGNOSIS — E7800 Pure hypercholesterolemia, unspecified: Secondary | ICD-10-CM | POA: Diagnosis present

## 2013-07-18 DIAGNOSIS — J189 Pneumonia, unspecified organism: Secondary | ICD-10-CM | POA: Diagnosis present

## 2013-07-18 DIAGNOSIS — I2589 Other forms of chronic ischemic heart disease: Secondary | ICD-10-CM | POA: Diagnosis present

## 2013-07-18 DIAGNOSIS — I5021 Acute systolic (congestive) heart failure: Secondary | ICD-10-CM | POA: Diagnosis present

## 2013-07-18 DIAGNOSIS — E8809 Other disorders of plasma-protein metabolism, not elsewhere classified: Secondary | ICD-10-CM | POA: Diagnosis present

## 2013-07-18 HISTORY — DX: Heart failure, unspecified: I50.9

## 2013-07-18 LAB — CBC
HCT: 45.4 % (ref 36.0–46.0)
HEMATOCRIT: 48.1 % — AB (ref 36.0–46.0)
HEMOGLOBIN: 16 g/dL — AB (ref 12.0–15.0)
HEMOGLOBIN: 15.2 g/dL — AB (ref 12.0–15.0)
MCH: 26.1 pg (ref 26.0–34.0)
MCH: 26.1 pg (ref 26.0–34.0)
MCHC: 33.3 g/dL (ref 30.0–36.0)
MCHC: 33.5 g/dL (ref 30.0–36.0)
MCV: 78.3 fL (ref 78.0–100.0)
MCV: 77.9 fL — ABNORMAL LOW (ref 78.0–100.0)
Platelets: 210 10*3/uL (ref 150–400)
Platelets: 217 10*3/uL (ref 150–400)
RBC: 6.14 MIL/uL — ABNORMAL HIGH (ref 3.87–5.11)
RBC: 5.83 MIL/uL — ABNORMAL HIGH (ref 3.87–5.11)
RDW: 16 % — ABNORMAL HIGH (ref 11.5–15.5)
RDW: 15.7 % — ABNORMAL HIGH (ref 11.5–15.5)
WBC: 5.2 10*3/uL (ref 4.0–10.5)
WBC: 4.5 10*3/uL (ref 4.0–10.5)

## 2013-07-18 LAB — COMPREHENSIVE METABOLIC PANEL
ALT: 21 U/L (ref 0–35)
AST: 24 U/L (ref 0–37)
Albumin: 2.9 g/dL — ABNORMAL LOW (ref 3.5–5.2)
Alkaline Phosphatase: 87 U/L (ref 39–117)
BUN: 61 mg/dL — AB (ref 6–23)
CO2: 21 mEq/L (ref 19–32)
Calcium: 9.1 mg/dL (ref 8.4–10.5)
Chloride: 96 mEq/L (ref 96–112)
Creatinine, Ser: 1.62 mg/dL — ABNORMAL HIGH (ref 0.50–1.10)
GFR calc Af Amer: 39 mL/min — ABNORMAL LOW (ref >90)
GFR calc non Af Amer: 33 mL/min — ABNORMAL LOW (ref >90)
Glucose, Bld: 201 mg/dL — ABNORMAL HIGH (ref 70–99)
Potassium: 4.7 mEq/L (ref 3.7–5.3)
Sodium: 135 mEq/L — ABNORMAL LOW (ref 137–147)
TOTAL PROTEIN: 6.8 g/dL (ref 6.0–8.3)
Total Bilirubin: 1.3 mg/dL — ABNORMAL HIGH (ref 0.3–1.2)

## 2013-07-18 LAB — TROPONIN I: Troponin I: 0.36 ng/mL (ref <0.30)

## 2013-07-18 LAB — GLUCOSE, CAPILLARY: GLUCOSE-CAPILLARY: 190 mg/dL — AB (ref 70–99)

## 2013-07-18 LAB — PRO B NATRIURETIC PEPTIDE: Pro B Natriuretic peptide (BNP): 18391 pg/mL — ABNORMAL HIGH (ref 0–125)

## 2013-07-18 MED ORDER — CLONIDINE HCL 0.2 MG PO TABS
0.2000 mg | ORAL_TABLET | Freq: Every day | ORAL | Status: DC
Start: 2013-07-18 — End: 2013-07-20
  Administered 2013-07-18 – 2013-07-19 (×2): 0.2 mg via ORAL
  Filled 2013-07-18 (×3): qty 1

## 2013-07-18 MED ORDER — RAMIPRIL 5 MG PO CAPS
5.0000 mg | ORAL_CAPSULE | Freq: Two times a day (BID) | ORAL | Status: DC
  Administered 2013-07-18 – 2013-07-20 (×3): 5 mg via ORAL
  Filled 2013-07-18 (×6): qty 1

## 2013-07-18 MED ORDER — ONDANSETRON HCL 4 MG/2ML IJ SOLN: 4.0000 mg | Freq: Four times a day (QID) | INTRAMUSCULAR | Status: DC | PRN

## 2013-07-18 MED ORDER — NITROGLYCERIN 0.4 MG SL SUBL: 0.4000 mg | SUBLINGUAL_TABLET | SUBLINGUAL | Status: DC | PRN

## 2013-07-18 MED ORDER — GATIFLOXACIN 0.5 % OP SOLN
1.0000 [drp] | Freq: Four times a day (QID) | OPHTHALMIC | Status: DC
  Administered 2013-07-18 – 2013-07-25 (×17): 1 [drp] via OPHTHALMIC
  Filled 2013-07-18 (×3): qty 2.5

## 2013-07-18 MED ORDER — AMLODIPINE BESYLATE 5 MG PO TABS
5.0000 mg | ORAL_TABLET | Freq: Every day | ORAL | Status: DC
  Administered 2013-07-19 – 2013-07-20 (×2): 5 mg via ORAL
  Filled 2013-07-18 (×3): qty 1

## 2013-07-18 MED ORDER — SODIUM CHLORIDE 0.9 % IJ SOLN
3.0000 mL | Freq: Two times a day (BID) | INTRAMUSCULAR | Status: DC
  Administered 2013-07-18 – 2013-07-25 (×14): 3 mL via INTRAVENOUS

## 2013-07-18 MED ORDER — ISOSORB DINITRATE-HYDRALAZINE 20-37.5 MG PO TABS
1.0000 | ORAL_TABLET | Freq: Three times a day (TID) | ORAL | Status: DC
  Administered 2013-07-18 – 2013-07-25 (×16): 1 via ORAL
  Filled 2013-07-18 (×23): qty 1

## 2013-07-18 MED ORDER — LEVOFLOXACIN IN D5W 750 MG/150ML IV SOLN
750.0000 mg | INTRAVENOUS | Status: DC
  Administered 2013-07-20 – 2013-07-24 (×3): 750 mg via INTRAVENOUS
  Filled 2013-07-18 (×3): qty 150

## 2013-07-18 MED ORDER — INSULIN ASPART 100 UNIT/ML ~~LOC~~ SOLN
0.0000 [IU] | Freq: Three times a day (TID) | SUBCUTANEOUS | Status: DC
  Administered 2013-07-19: 2 [IU] via SUBCUTANEOUS
  Administered 2013-07-19: 07:00:00 1 [IU] via SUBCUTANEOUS
  Administered 2013-07-20: 13:00:00 via SUBCUTANEOUS
  Administered 2013-07-20: 18:00:00 1 [IU] via SUBCUTANEOUS
  Administered 2013-07-21: 2 [IU] via SUBCUTANEOUS
  Administered 2013-07-21: 17:00:00 1 [IU] via SUBCUTANEOUS
  Administered 2013-07-22: 17:00:00 2 [IU] via SUBCUTANEOUS
  Administered 2013-07-22 – 2013-07-23 (×2): 1 [IU] via SUBCUTANEOUS
  Administered 2013-07-23: 3 [IU] via SUBCUTANEOUS
  Administered 2013-07-23: 1 [IU] via SUBCUTANEOUS
  Administered 2013-07-24: 12:00:00 3 [IU] via SUBCUTANEOUS
  Administered 2013-07-25: 11:00:00 2 [IU] via SUBCUTANEOUS
  Administered 2013-07-25: 17:00:00 1 [IU] via SUBCUTANEOUS

## 2013-07-18 MED ORDER — POTASSIUM CHLORIDE CRYS ER 10 MEQ PO TBCR
10.0000 meq | EXTENDED_RELEASE_TABLET | Freq: Two times a day (BID) | ORAL | Status: DC
  Administered 2013-07-18 – 2013-07-24 (×12): 10 meq via ORAL
  Filled 2013-07-18 (×14): qty 1

## 2013-07-18 MED ORDER — ADULT MULTIVITAMIN W/MINERALS CH
1.0000 | ORAL_TABLET | Freq: Every day | ORAL | Status: DC
  Administered 2013-07-19 – 2013-07-25 (×7): 1 via ORAL
  Filled 2013-07-18 (×7): qty 1

## 2013-07-18 MED ORDER — SODIUM CHLORIDE 0.9 % IJ SOLN: 3.0000 mL | INTRAMUSCULAR | Status: DC | PRN

## 2013-07-18 MED ORDER — PANTOPRAZOLE SODIUM 40 MG PO TBEC
40.0000 mg | DELAYED_RELEASE_TABLET | Freq: Every day | ORAL | Status: DC
  Administered 2013-07-19 – 2013-07-25 (×7): 40 mg via ORAL
  Filled 2013-07-18 (×6): qty 1

## 2013-07-18 MED ORDER — ASPIRIN EC 81 MG PO TBEC
81.0000 mg | DELAYED_RELEASE_TABLET | Freq: Every day | ORAL | Status: DC
  Administered 2013-07-18 – 2013-07-25 (×8): 81 mg via ORAL
  Filled 2013-07-18 (×8): qty 1

## 2013-07-18 MED ORDER — FUROSEMIDE 10 MG/ML IJ SOLN
60.0000 mg | Freq: Two times a day (BID) | INTRAMUSCULAR | Status: DC
  Administered 2013-07-19: 60 mg via INTRAVENOUS
  Filled 2013-07-18 (×2): qty 6

## 2013-07-18 MED ORDER — HEPARIN SODIUM (PORCINE) 5000 UNIT/ML IJ SOLN
5000.0000 [IU] | Freq: Three times a day (TID) | INTRAMUSCULAR | Status: DC
  Administered 2013-07-18 – 2013-07-19 (×3): 5000 [IU] via SUBCUTANEOUS
  Filled 2013-07-18 (×5): qty 1

## 2013-07-18 MED ORDER — ACETAMINOPHEN 325 MG PO TABS
650.0000 mg | ORAL_TABLET | ORAL | Status: DC | PRN
  Administered 2013-07-21: 650 mg via ORAL
  Filled 2013-07-18 (×2): qty 2

## 2013-07-18 MED ORDER — METOPROLOL TARTRATE 25 MG PO TABS
25.0000 mg | ORAL_TABLET | Freq: Two times a day (BID) | ORAL | Status: DC
  Administered 2013-07-18 – 2013-07-25 (×12): 25 mg via ORAL
  Filled 2013-07-18 (×15): qty 1

## 2013-07-18 MED ORDER — FUROSEMIDE 10 MG/ML IJ SOLN
80.0000 mg | Freq: Once | INTRAMUSCULAR | Status: AC
  Administered 2013-07-18: 80 mg via INTRAVENOUS
  Filled 2013-07-18: qty 8

## 2013-07-18 MED ORDER — SPIRONOLACTONE 25 MG PO TABS
25.0000 mg | ORAL_TABLET | Freq: Every day | ORAL | Status: DC
  Administered 2013-07-19 – 2013-07-25 (×7): 25 mg via ORAL
  Filled 2013-07-18 (×8): qty 1

## 2013-07-18 MED ORDER — SODIUM CHLORIDE 0.9 % IV SOLN: 250.0000 mL | INTRAVENOUS | Status: DC | PRN

## 2013-07-18 MED ORDER — GLIPIZIDE 5 MG PO TABS
5.0000 mg | ORAL_TABLET | Freq: Two times a day (BID) | ORAL | Status: DC
  Administered 2013-07-19 – 2013-07-25 (×13): 5 mg via ORAL
  Filled 2013-07-18 (×15): qty 1

## 2013-07-18 MED ORDER — LEVOFLOXACIN IN D5W 750 MG/150ML IV SOLN
750.0000 mg | INTRAVENOUS | Status: DC
  Administered 2013-07-18: 750 mg via INTRAVENOUS

## 2013-07-18 MED ORDER — SIMVASTATIN 10 MG PO TABS
10.0000 mg | ORAL_TABLET | Freq: Every day | ORAL | Status: DC
  Administered 2013-07-19 – 2013-07-24 (×6): 10 mg via ORAL
  Filled 2013-07-18 (×7): qty 1

## 2013-07-18 MED ORDER — FUROSEMIDE 10 MG/ML IJ SOLN
INTRAMUSCULAR | Status: AC
  Filled 2013-07-18: qty 8

## 2013-07-18 MED ORDER — ASPIRIN EC 81 MG PO TBEC: 81.0000 mg | DELAYED_RELEASE_TABLET | Freq: Every day | ORAL | Status: DC

## 2013-07-18 NOTE — ED Provider Notes (Signed)
CSN: [id number]     Arrival date & time 07/18/13  1446 History   First MD Initiated Contact with Patient 07/18/13 1511     Chief Complaint  Patient presents with  . Shortness of Breath   (Consider location/radiation/quality/duration/timing/severity/associated sxs/prior Treatment) The history is provided by the patient.   Patient reports gradually worsening shortness of breath, leg swelling, and subjective weight gain x 1-2 weeks.  Has 3 pillow orthopnea.  Has also had hacking cough and rhinorrhea. Denies chest pain.   +sick contacts in daughter and grandchild.  Denies fever, chills, body aches.  Is taking home medications including lasix without improvement.    Past Medical History  Diagnosis Date  . Hypertension   . Diabetes mellitus   . Heart failure   . Coronary artery disease   . Stented coronary artery   . CHF (congestive heart failure)    History reviewed. No pertinent past surgical history. No family history on file. History  Substance Use Topics  . Smoking status: Never Smoker   . Smokeless tobacco: Not on file  . Alcohol Use: No   OB History   Grav Para Term Preterm Abortions TAB SAB Ect Mult Living                 Review of Systems  Constitutional: Negative for fever, chills and diaphoresis.  HENT: Positive for rhinorrhea. Negative for sore throat.   Respiratory: Positive for cough and shortness of breath.   Cardiovascular: Positive for palpitations and leg swelling. Negative for chest pain.  Gastrointestinal: Negative for nausea, vomiting, abdominal pain and diarrhea.  Musculoskeletal: Negative for myalgias.  Neurological: Negative for headaches.  All other systems reviewed and are negative.    Allergies  Review of patient's allergies indicates no known allergies.  Home Medications   Current Outpatient Rx  Name  Route  Sig  Dispense  Refill  . amLODipine (NORVASC) 5 MG tablet   Oral   Take 1 tablet (5 mg total) by mouth daily.         Marland Kitchen aspirin EC  81 MG tablet   Oral   Take 81 mg by mouth daily.         . cloNIDine (CATAPRES) 0.2 MG tablet   Oral   Take 1 tablet (0.2 mg total) by mouth at bedtime.   30 tablet      . furosemide (LASIX) 40 MG tablet   Oral   Take 2 tablets (80 mg total) by mouth daily.   120 tablet   1   . gatifloxacin (ZYMAR) 0.3 % ophthalmic drops   Both Eyes   Place 1 drop into both eyes 4 (four) times daily.         Marland Kitchen glipiZIDE (GLUCOTROL) 5 MG tablet   Oral   Take 1 tablet (5 mg total) by mouth 2 (two) times daily before a meal.   60 tablet   1   . isosorbide-hydrALAZINE (BIDIL) 20-37.5 MG per tablet   Oral   Take 1 tablet by mouth 3 (three) times daily.   90 tablet   1   . metoprolol tartrate (LOPRESSOR) 25 MG tablet   Oral   Take 1 tablet (25 mg total) by mouth 2 (two) times daily.   60 tablet   1   . Multiple Vitamin (MULTIVITAMIN WITH MINERALS) TABS   Oral   Take 1 tablet by mouth daily.         . Omega-3 Fatty Acids (OMEGA 3 PO)  Oral   Take 1 capsule by mouth daily.         Marland Kitchen. omeprazole (PRILOSEC) 20 MG capsule   Oral   Take 20 mg by mouth daily.         . potassium chloride (K-DUR) 10 MEQ tablet   Oral   Take 1 tablet (10 mEq total) by mouth 2 (two) times daily. 11/21/11 last dose.   60 tablet   1   . pravastatin (PRAVACHOL) 40 MG tablet   Oral   Take 1 tablet (40 mg total) by mouth at bedtime.   30 tablet   1   . ramipril (ALTACE) 5 MG capsule   Oral   Take 1 capsule (5 mg total) by mouth every 12 (twelve) hours.   60 capsule   1   . nitroGLYCERIN (NITROSTAT) 0.4 MG SL tablet   Sublingual   Place 1 tablet (0.4 mg total) under the tongue every 5 (five) minutes as needed for chest pain.   25 tablet   1    BP 185/121  Pulse 102  Temp(Src) 97.4 F (36.3 C) (Oral)  Resp 18  Wt 176 lb (79.833 kg)  SpO2 100% Physical Exam  Nursing note and vitals reviewed. Constitutional: She appears well-developed and well-nourished. No distress.  HENT:  Head:  Normocephalic and atraumatic.  Neck: Neck supple.  Cardiovascular: Normal rate and regular rhythm.   Pulmonary/Chest: Effort normal. No respiratory distress. She has decreased breath sounds. She has no wheezes. She has no rhonchi. She has no rales.  Abdominal: Soft. She exhibits no distension. There is no tenderness. There is no rebound and no guarding.  Musculoskeletal: She exhibits edema.  Bilateral LE pitting to the knees.   Neurological: She is alert.  Skin: She is not diaphoretic.    ED Course  Procedures (including critical care time) Labs Review Labs Reviewed  PRO B NATRIURETIC PEPTIDE - Abnormal; Notable for the following:    Pro B Natriuretic peptide (BNP) 18391.0 (*)    All other components within normal limits  CBC - Abnormal; Notable for the following:    RBC 6.14 (*)    Hemoglobin 16.0 (*)    HCT 48.1 (*)    RDW 16.0 (*)    All other components within normal limits  TROPONIN I - Abnormal; Notable for the following:    Troponin I 0.36 (*)    All other components within normal limits  COMPREHENSIVE METABOLIC PANEL - Abnormal; Notable for the following:    Sodium 135 (*)    Glucose, Bld 201 (*)    BUN 61 (*)    Creatinine, Ser 1.62 (*)    Albumin 2.9 (*)    Total Bilirubin 1.3 (*)    GFR calc non Af Amer 33 (*)    GFR calc Af Amer 39 (*)    All other components within normal limits  COMPREHENSIVE METABOLIC PANEL   Imaging Review Dg Chest 2 View  07/18/2013   CLINICAL DATA:  Shortness of breath and weakness with fluid on lungs for 3 days  EXAM: CHEST  2 VIEW  COMPARISON:  12/30/2012  FINDINGS: Moderately severe cardiac enlargement. Bilateral perihilar moderate vascular congestion. Right lung is clear. On the left side, there are increased linear densities in the retrocardiac portion of the lower lobe. There appears to be a small left pleural effusion.  Coronary arterial calcification is noted.  IMPRESSION: Moderately severe cardiac enlargement with vascular  congestion. Focal opacity with tiny left effusion on the  left suggests left-sided infiltrate possibly from pneumonia or aspiration. Asymmetric pulmonary edema is considered to be less likely.   Electronically Signed   By: Esperanza Heir M.D.   On: 07/18/2013 15:29    EKG Interpretation    Date/Time:  Thursday July 18 2013 14:47:43 EST Ventricular Rate:  103 PR Interval:  140 QRS Duration: 86 QT Interval:  322 QTC Calculation: 421 R Axis:   141 Text Interpretation:  Sinus tachycardia Biatrial enlargement Right axis deviation Abnormal QRS-T angle, consider primary T wave abnormality Abnormal ECG Confirmed by HARRISON  MD, FORREST ([id number]) on 07/18/2013 5:15:49 PM           Filed Vitals:   07/18/13 1930  BP: 155/95  Pulse: 91  Temp:   Resp: 34     MDM   1. CHF exacerbation   2. Elevated troponin   3. CAP (community acquired pneumonia)     Pt with hx CHF, CAD, COPD p/w gradually worsening SOB and peripheral edema x 7-10 days.  Pt also has "hacking cough" and has had sick contacts.  CXR shows both vascular congestion and left effusion and left-sided infiltrate.  Troponin elevated.  Pt reported some improvement with lasix. Discussed patient with Dr Algie Coffer who came to ED directly to see the patient.  Admitted to Dr Algie Coffer.        Trixie Dredge, PA-C 07/18/13 2004

## 2013-07-18 NOTE — H&P (Signed)
[name] is an 63 y.o. female.   Chief Complaint: Leg edema and shortness of breath HPI: 63 year old female with past medical history significant for coronary artery disease status post anteroseptal wall myocardial infarction with occlusion of distal LAD in the recent past treated medically, status post PTCA stenting to proximal LAD in May of 2013, ischemic cardiomyopathy, history of congestive heart failure secondary to systolic dysfunction EF approximately 25-30%, hypertension, diabetes mellitus, hypercholesteremia, COPD, came to the ER complaining of progressive increasing shortness of breath associated with leg swelling for 1-2 weeks. No fever. Denies salt intake.   Past Medical History  Diagnosis Date  . Hypertension   . Diabetes mellitus   . Heart failure   . Coronary artery disease   . Stented coronary artery   . CHF (congestive heart failure)       History reviewed. No pertinent past surgical history.  No family history on file. Social History:  reports that she has never smoked. She does not have any smokeless tobacco history on file. She reports that she does not drink alcohol or use illicit drugs.  Allergies: No Known Allergies   (Not in a hospital admission)  Results for orders placed during the hospital encounter of 07/18/13 (from the past 48 hour([name]))  PRO B NATRIURETIC PEPTIDE     Status: Abnormal   Collection Time    07/18/13  2:56 PM      Result Value Range   Pro B Natriuretic peptide (BNP) 18391.0 (*) 0 - 125 pg/mL  CBC     Status: Abnormal   Collection Time    07/18/13  2:56 PM      Result Value Range   WBC 5.2  4.0 - 10.5 K/uL   RBC 6.14 (*) 3.87 - 5.11 MIL/uL   Hemoglobin 16.0 (*) 12.0 - 15.0 g/dL   HCT 16.1 (*) 09.6 - 04.5 %   MCV 78.3  78.0 - 100.0 fL   MCH 26.1  26.0 - 34.0 pg   MCHC 33.3  30.0 - 36.0 g/dL   RDW 40.9 (*) 81.1 - 91.4 %   Platelets 210  150 - 400 K/uL  TROPONIN I     Status: Abnormal   Collection Time    07/18/13  4:11 PM       Result Value Range   Troponin I 0.36 (*) <0.30 ng/mL   Comment:            Due to the release kinetics of cTnI,     a negative result within the first hours     of the onset of symptoms does not rule out     myocardial infarction with certainty.     If myocardial infarction is still suspected,     repeat the test at appropriate intervals.     CRITICAL RESULT CALLED TO, READ BACK BY AND VERIFIED WITH:     Angelita Ingles [id number] 07/18/13 D BRADLEY   Dg Chest 2 View  07/18/2013   CLINICAL DATA:  Shortness of breath and weakness with fluid on lungs for 3 days  EXAM: CHEST  2 VIEW  COMPARISON:  12/30/2012  FINDINGS: Moderately severe cardiac enlargement. Bilateral perihilar moderate vascular congestion. Right lung is clear. On the left side, there are increased linear densities in the retrocardiac portion of the lower lobe. There appears to be a small left pleural effusion.  Coronary arterial calcification is noted.  IMPRESSION: Moderately severe cardiac enlargement with vascular congestion. Focal opacity with tiny left effusion  on the left suggests left-sided infiltrate possibly from pneumonia or aspiration. Asymmetric pulmonary edema is considered to be less likely.   Electronically Signed   By: Esperanza Heiraymond  Rubner M.D.   On: 07/18/2013 15:29    ROS + wears glasses, + weight gain + partial dentures, + COPD, + angina, + leg edema, + hypertension, + DM, II, + CHF, No CVA, No seizures, No kidney stone, + arthritis.   Blood pressure 177/110, pulse 98, temperature 97.4 F (36.3 C), temperature source Oral, resp. rate 20, weight 79.833 kg (176 lb), SpO2 97.00%. Physical Exam: HEENT: [place]/AT, Ave built and nourished. Intel CorporationBrown eyes, PERL, EOMI.  Neck: + JVD, no bruit, no thyromegaly.  Lungs: Bilateral basal crackles.  Heart: Normal S1 and S2. Grade III/VI systolic murmur LSB  Abdomen: Mild swelling, non-tender.  Ext: 2 + edema up to knees, bilaterally.  CNS: Cranial nerves grossly intact.  Skin: Warm and  dry.  Assessment/Plan Acute systolic heart failure Possible pneumonia  CAD [name] DM, II  Hypertension  Anxiety  IV lasix/Admit/Antibiotic   [name] 07/18/2013, 6:26 PM

## 2013-07-18 NOTE — ED Notes (Signed)
Pt reports increased SOB x 3 days with increase in peripheral edema and dyspnea. States she takes Lasix as prescribed but noticed increased swelling to "entire body." Pt reports dyspnea with exertion and sleeping with 3 pillows. NAD at this time. Placed on monitor.

## 2013-07-18 NOTE — ED Notes (Signed)
Sob x a couple of weeks 2  swelling in ankles  Has hx of chf and diabetes got bad a few days ago

## 2013-07-18 NOTE — Progress Notes (Signed)
Unit CM UR Completed by [facility] ED CM  W. Chandra Feger RN  

## 2013-07-19 LAB — BASIC METABOLIC PANEL
BUN: 63 mg/dL — AB (ref 6–23)
CHLORIDE: 100 meq/L (ref 96–112)
CO2: 24 mEq/L (ref 19–32)
Calcium: 8.5 mg/dL (ref 8.4–10.5)
Creatinine, Ser: 1.88 mg/dL — ABNORMAL HIGH (ref 0.50–1.10)
GFR, EST AFRICAN AMERICAN: 32 mL/min — AB (ref >90)
GFR, EST NON AFRICAN AMERICAN: 28 mL/min — AB (ref >90)
Glucose, Bld: 135 mg/dL — ABNORMAL HIGH (ref 70–99)
Potassium: 4.8 mEq/L (ref 3.7–5.3)
Sodium: 138 mEq/L (ref 137–147)

## 2013-07-19 LAB — GLUCOSE, CAPILLARY
GLUCOSE-CAPILLARY: 156 mg/dL — AB (ref 70–99)
Glucose-Capillary: 142 mg/dL — ABNORMAL HIGH (ref 70–99)
Glucose-Capillary: 87 mg/dL (ref 70–99)

## 2013-07-19 MED ORDER — ALBUMIN HUMAN 25 % IV SOLN
12.5000 g | Freq: Once | INTRAVENOUS | Status: AC
  Administered 2013-07-19: 14:00:00 12.5 g via INTRAVENOUS
  Filled 2013-07-19: qty 50

## 2013-07-19 MED ORDER — WARFARIN VIDEO
Freq: Once | Status: AC
  Administered 2013-07-19: 18:00:00

## 2013-07-19 MED ORDER — HEPARIN BOLUS VIA INFUSION
4000.0000 [IU] | Freq: Once | INTRAVENOUS | Status: AC
  Administered 2013-07-19: 4000 [IU] via INTRAVENOUS
  Filled 2013-07-19: qty 4000

## 2013-07-19 MED ORDER — METOLAZONE 2.5 MG PO TABS
2.5000 mg | ORAL_TABLET | Freq: Once | ORAL | Status: AC
  Administered 2013-07-19: 12:00:00 2.5 mg via ORAL
  Filled 2013-07-19: qty 1

## 2013-07-19 MED ORDER — WARFARIN - PHARMACIST DOSING INPATIENT
Freq: Every day | Status: DC
  Administered 2013-07-19 – 2013-07-23 (×2)

## 2013-07-19 MED ORDER — HEPARIN (PORCINE) IN NACL 100-0.45 UNIT/ML-% IJ SOLN
950.0000 [IU]/h | INTRAMUSCULAR | Status: DC
  Administered 2013-07-19 – 2013-07-20 (×2): 1100 [IU]/h via INTRAVENOUS
  Administered 2013-07-21: 950 [IU]/h via INTRAVENOUS
  Filled 2013-07-19 (×7): qty 250

## 2013-07-19 MED ORDER — FUROSEMIDE 40 MG PO TABS
40.0000 mg | ORAL_TABLET | Freq: Two times a day (BID) | ORAL | Status: DC
  Administered 2013-07-19 – 2013-07-20 (×4): 40 mg via ORAL
  Filled 2013-07-19 (×7): qty 1

## 2013-07-19 MED ORDER — WARFARIN SODIUM 7.5 MG PO TABS
7.5000 mg | ORAL_TABLET | Freq: Once | ORAL | Status: AC
  Administered 2013-07-19: 7.5 mg via ORAL
  Filled 2013-07-19: qty 1

## 2013-07-19 MED ORDER — COUMADIN BOOK
Freq: Once | Status: AC
  Administered 2013-07-19: 18:00:00
  Filled 2013-07-19: qty 1

## 2013-07-19 MED ORDER — FUROSEMIDE 10 MG/ML IJ SOLN
80.0000 mg | Freq: Once | INTRAMUSCULAR | Status: AC
  Administered 2013-07-19: 15:00:00 80 mg via INTRAVENOUS

## 2013-07-19 NOTE — ED Provider Notes (Signed)
Medical screening examination/treatment/procedure(s) were performed by non-physician practitioner and as supervising physician I was immediately available for consultation/collaboration.  EKG Interpretation    Date/Time:  Thursday July 18 2013 14:47:43 EST Ventricular Rate:  103 PR Interval:  140 QRS Duration: 86 QT Interval:  322 QTC Calculation: 421 R Axis:   141 Text Interpretation:  Sinus tachycardia Biatrial enlargement Right axis deviation Abnormal QRS-T angle, consider primary T wave abnormality Abnormal ECG Confirmed by Romeo AppleHARRISON  MD, Jujuan Dugo ([id number]) on 07/18/2013 5:15:49 PM              Randa SpikeForrest Mort SawyersS Joeanthony Seeling, MD 07/19/13 1034

## 2013-07-19 NOTE — Progress Notes (Signed)
Subjective:  Still short of breath. T max 98.1 F  Objective:  Vital Signs in the last 24 hours: Temp:  [97.2 F (36.2 C)-98.1 F (36.7 C)] 97.7 F (36.5 C) (01/09 0616) Pulse Rate:  [72-105] 72 (01/09 0616) Cardiac Rhythm:  [-] Normal sinus rhythm (01/08 2030) Resp:  [18-35] 20 (01/09 0616) BP: (107-191)/(66-123) 121/72 mmHg (01/09 0616) SpO2:  [92 %-100 %] 97 % (01/09 0616) Weight:  [78.245 kg (172 lb 8 oz)-79.833 kg (176 lb)] 78.245 kg (172 lb 8 oz) (01/09 0636)  Physical Exam: BP Readings from Last 1 Encounters:  07/19/13 121/72     Wt Readings from Last 1 Encounters:  07/19/13 78.245 kg (172 lb 8 oz)    Weight change:   HEENT: [place]/AT, Eyes-Brown, PERL, EOMI, Conjunctiva-Pink, Sclera-Non-icteric Neck: + JVD, No bruit, Trachea midline. Lungs:  Clearing with fewer basal crackles, Bilateral. III/VI systolic murmur. Cardiac:  Regular rhythm, normal S1 and S2, no S3.  Abdomen:  Soft, non-tender. Extremities:  2 + edema present. No cyanosis. No clubbing. CNS: AxOx3, Cranial nerves grossly intact, moves all 4 extremities. Right handed. Skin: Warm and dry.   Intake/Output from previous day: 01/08 0701 - 01/09 0700 In: 360 [P.O.:360] Out: 1050 [Urine:1050]    Lab Results: BMET    Component Value Date/Time   NA 138 07/19/2013 0529   K 4.8 07/19/2013 0529   CL 100 07/19/2013 0529   CO2 24 07/19/2013 0529   GLUCOSE 135* 07/19/2013 0529   BUN 63* 07/19/2013 0529   CREATININE 1.88* 07/19/2013 0529   CALCIUM 8.5 07/19/2013 0529   GFRNONAA 28* 07/19/2013 0529   GFRAA 32* 07/19/2013 0529   CBC    Component Value Date/Time   WBC 4.5 07/18/2013 2135   RBC 5.83* 07/18/2013 2135   HGB 15.2* 07/18/2013 2135   HCT 45.4 07/18/2013 2135   PLT 217 07/18/2013 2135   MCV 77.9* 07/18/2013 2135   MCH 26.1 07/18/2013 2135   MCHC 33.5 07/18/2013 2135   RDW 15.7* 07/18/2013 2135   LYMPHSABS 1.4 12/31/2012 0008   MONOABS 0.6 12/31/2012 0008   EOSABS 0.1 12/31/2012 0008   BASOSABS 0.0 12/31/2012 0008   CARDIAC  ENZYMES Lab Results  Component Value Date   CKTOTAL 59 09/07/2012   CKMB 3.2 09/07/2012   TROPONINI 0.36* 07/18/2013    Scheduled Meds: . albumin human  12.5 g Intravenous Once  . amLODipine  5 mg Oral Daily  . aspirin EC  81 mg Oral Daily  . cloNIDine  0.2 mg Oral QHS  . furosemide  80 mg Intravenous Once  . furosemide  40 mg Oral BID  . gatifloxacin  1 drop Both Eyes QID  . glipiZIDE  5 mg Oral BID AC  . heparin  5,000 Units Subcutaneous Q8H  . insulin aspart  0-9 Units Subcutaneous TID WC  . isosorbide-hydrALAZINE  1 tablet Oral TID  . [START ON 07/20/2013] levofloxacin  750 mg Intravenous Q48H  . metolazone  2.5 mg Oral Once  . metoprolol tartrate  25 mg Oral BID  . multivitamin with minerals  1 tablet Oral Daily  . pantoprazole  40 mg Oral Daily  . potassium chloride  10 mEq Oral BID  . ramipril  5 mg Oral Q12H  . simvastatin  10 mg Oral q1800  . sodium chloride  3 mL Intravenous Q12H  . spironolactone  25 mg Oral Daily   Continuous Infusions:  PRN Meds:.sodium chloride, acetaminophen, nitroGLYCERIN, ondansetron (ZOFRAN) IV, sodium chloride  Assessment/Plan: Acute  systolic heart failure  Possible pneumonia  CAD  S/P Stent in LAD  DM, II  Hypertension  Anxiety Hypoalbuminemia CKD, II  Continue diuresis as tolerated.   LOS: 1 day    [name]  MD  07/19/2013, 10:23 AM

## 2013-07-19 NOTE — Progress Notes (Signed)
  Echocardiogram 2D Echocardiogram has been performed.  Jorje GuildCHUNG, Ivone Licht 07/19/2013, 4:44 PM

## 2013-07-19 NOTE — Progress Notes (Signed)
Transporter came to transport patient to vascular lab and patient refuses to transport for procedure, MD requested that the procedure be done in bed, vascular lab notified; will continue to monitor patient. D.Breckyn Ticas RN

## 2013-07-19 NOTE — Progress Notes (Signed)
Patient requested Tylenol for back pain, upon giving medciation, patient wanted to hold off because she states, "I may be taking too much medication and I will just hold off".  Medication wasted; will continue to monitor patient. Lorretta Harp. Tieshia Rettinger RN

## 2013-07-19 NOTE — Progress Notes (Signed)
ANTICOAGULATION CONSULT NOTE - Initial Consult  Pharmacy Consult for heparin and coumadin Indication: apical LV and RV clot  No Known Allergies  Patient Measurements: Height: 5\' 4"  (162.6 cm) Weight: 172 lb 8 oz (78.245 kg) (a scale) IBW/kg (Calculated) : 54.7   Vital Signs: Temp: 97.8 F (36.6 C) (01/09 1512) Temp src: Oral (01/09 1512) BP: 90/55 mmHg (01/09 1512) Pulse Rate: 68 (01/09 1512)  Labs:  Recent Labs  07/18/13 1456 07/18/13 1611 07/18/13 1821 07/18/13 2135 07/19/13 0529  HGB 16.0*  --   --  15.2*  --   HCT 48.1*  --   --  45.4  --   PLT 210  --   --  217  --   CREATININE  --   --  1.62*  --  1.88*  TROPONINI  --  0.36*  --   --   --     Estimated Creatinine Clearance: 31.8 ml/min (by C-G formula based on Cr of 1.88).   Medical History: Past Medical History  Diagnosis Date  . Hypertension   . Diabetes mellitus   . Heart failure   . Coronary artery disease   . Stented coronary artery   . CHF (congestive heart failure)     Medications:  Prescriptions prior to admission  Medication Sig Dispense Refill  . amLODipine (NORVASC) 5 MG tablet Take 1 tablet (5 mg total) by mouth daily.      Marland Kitchen aspirin EC 81 MG tablet Take 81 mg by mouth daily.      . cloNIDine (CATAPRES) 0.2 MG tablet Take 1 tablet (0.2 mg total) by mouth at bedtime.  30 tablet    . furosemide (LASIX) 40 MG tablet Take 2 tablets (80 mg total) by mouth daily.  120 tablet  1  . gatifloxacin (ZYMAR) 0.3 % ophthalmic drops Place 1 drop into both eyes 4 (four) times daily.      Marland Kitchen glipiZIDE (GLUCOTROL) 5 MG tablet Take 1 tablet (5 mg total) by mouth 2 (two) times daily before a meal.  60 tablet  1  . isosorbide-hydrALAZINE (BIDIL) 20-37.5 MG per tablet Take 1 tablet by mouth 3 (three) times daily.  90 tablet  1  . metoprolol tartrate (LOPRESSOR) 25 MG tablet Take 1 tablet (25 mg total) by mouth 2 (two) times daily.  60 tablet  1  . Multiple Vitamin (MULTIVITAMIN WITH MINERALS) TABS Take 1 tablet  by mouth daily.      . Omega-3 Fatty Acids (OMEGA 3 PO) Take 1 capsule by mouth daily.      Marland Kitchen omeprazole (PRILOSEC) 20 MG capsule Take 20 mg by mouth daily.      . potassium chloride (K-DUR) 10 MEQ tablet Take 1 tablet (10 mEq total) by mouth 2 (two) times daily. 11/21/11 last dose.  60 tablet  1  . pravastatin (PRAVACHOL) 40 MG tablet Take 1 tablet (40 mg total) by mouth at bedtime.  30 tablet  1  . ramipril (ALTACE) 5 MG capsule Take 1 capsule (5 mg total) by mouth every 12 (twelve) hours.  60 capsule  1  . nitroGLYCERIN (NITROSTAT) 0.4 MG SL tablet Place 1 tablet (0.4 mg total) under the tongue every 5 (five) minutes as needed for chest pain.  25 tablet  1    Assessment: 63 yo lady to start heparin and coumadin for apical LV and RV clot.  She was not on anticoagulants PTA.  Her baseline Hg 16 and PTLC 210  She was on sq heparin  for VTE px, last dose today at 14:10. Goal of Therapy:  INR 2-3 Heparin level 0.3-0.7 units/ml Monitor platelets by anticoagulation protocol: Yes   Plan:  Heparin bolus 4000 units and drip at 1100 units/hr Check heparin level and CBC 8 hours after bolus and daily while on heparin. Coumadin 7.5 mg today Check daily PT/INR.  [name] 07/19/2013,5:42 PM

## 2013-07-19 NOTE — Progress Notes (Signed)
Subjective:  Apical LV and RV clot on echocardiogram with poor LV function with moderate MR and TR and mild AI and PI.  Objective:  Vital Signs in the last 24 hours: Temp:  [97.2 F (36.2 C)-98.1 F (36.7 C)] 97.8 F (36.6 C) (01/09 1512) Pulse Rate:  [68-99] 68 (01/09 1512) Cardiac Rhythm:  [-] Normal sinus rhythm;Bundle branch block (01/09 0845) Resp:  [20-35] 20 (01/09 1512) BP: (90-177)/(55-110) 90/55 mmHg (01/09 1512) SpO2:  [92 %-99 %] 99 % (01/09 1512) Weight:  [78.245 kg (172 lb 8 oz)-78.881 kg (173 lb 14.4 oz)] 78.245 kg (172 lb 8 oz) (01/09 0636)  Physical Exam: BP Readings from Last 1 Encounters:  07/19/13 90/55     Wt Readings from Last 1 Encounters:  07/19/13 78.245 kg (172 lb 8 oz)    Weight change:   HEENT: [place]/AT, Eyes-Brown, PERL, EOMI, Conjunctiva-Pink, Sclera-Non-icteric Neck: + JVD, No bruit, Trachea midline. Lungs:  Crackles, Bilateral. Cardiac:  Regular rhythm, normal S1 and S2, no S3.  Abdomen:  Soft, non-tender. Extremities:  2 + edema present. No cyanosis. No clubbing. CNS: AxOx3, Cranial nerves grossly intact, moves all 4 extremities. Right handed. Skin: Warm and dry.   Intake/Output from previous day: 01/08 0701 - 01/09 0700 In: 360 [P.O.:360] Out: 1050 [Urine:1050]    Lab Results: BMET    Component Value Date/Time   NA 138 07/19/2013 0529   K 4.8 07/19/2013 0529   CL 100 07/19/2013 0529   CO2 24 07/19/2013 0529   GLUCOSE 135* 07/19/2013 0529   BUN 63* 07/19/2013 0529   CREATININE 1.88* 07/19/2013 0529   CALCIUM 8.5 07/19/2013 0529   GFRNONAA 28* 07/19/2013 0529   GFRAA 32* 07/19/2013 0529   CBC    Component Value Date/Time   WBC 4.5 07/18/2013 2135   RBC 5.83* 07/18/2013 2135   HGB 15.2* 07/18/2013 2135   HCT 45.4 07/18/2013 2135   PLT 217 07/18/2013 2135   MCV 77.9* 07/18/2013 2135   MCH 26.1 07/18/2013 2135   MCHC 33.5 07/18/2013 2135   RDW 15.7* 07/18/2013 2135   LYMPHSABS 1.4 12/31/2012 0008   MONOABS 0.6 12/31/2012 0008   EOSABS 0.1 12/31/2012 0008   BASOSABS 0.0 12/31/2012 0008   CARDIAC ENZYMES Lab Results  Component Value Date   CKTOTAL 59 09/07/2012   CKMB 3.2 09/07/2012   TROPONINI 0.36* 07/18/2013    Scheduled Meds: . amLODipine  5 mg Oral Daily  . aspirin EC  81 mg Oral Daily  . cloNIDine  0.2 mg Oral QHS  . furosemide  40 mg Oral BID  . gatifloxacin  1 drop Both Eyes QID  . glipiZIDE  5 mg Oral BID AC  . insulin aspart  0-9 Units Subcutaneous TID WC  . isosorbide-hydrALAZINE  1 tablet Oral TID  . [START ON 07/20/2013] levofloxacin  750 mg Intravenous Q48H  . metoprolol tartrate  25 mg Oral BID  . multivitamin with minerals  1 tablet Oral Daily  . pantoprazole  40 mg Oral Daily  . potassium chloride  10 mEq Oral BID  . ramipril  5 mg Oral Q12H  . simvastatin  10 mg Oral q1800  . sodium chloride  3 mL Intravenous Q12H  . spironolactone  25 mg Oral Daily   Continuous Infusions:  PRN Meds:.sodium chloride, acetaminophen, nitroGLYCERIN, ondansetron (ZOFRAN) IV, sodium chloride  Assessment/Plan: Acute systolic heart failure  Possible pneumonia  CAD  S/P Stent in LAD  DM, II  Hypertension  Anxiety  Hypoalbuminemia  CKD,  II  Start IV heparin and coumadin.   LOS: 1 day    [name]  MD  07/19/2013, 5:32 PM

## 2013-07-20 LAB — COMPREHENSIVE METABOLIC PANEL
ALBUMIN: 2.5 g/dL — AB (ref 3.5–5.2)
ALT: 16 U/L (ref 0–35)
AST: 20 U/L (ref 0–37)
Alkaline Phosphatase: 67 U/L (ref 39–117)
BILIRUBIN TOTAL: 0.7 mg/dL (ref 0.3–1.2)
BUN: 64 mg/dL — ABNORMAL HIGH (ref 6–23)
CHLORIDE: 100 meq/L (ref 96–112)
CO2: 24 mEq/L (ref 19–32)
CREATININE: 2.12 mg/dL — AB (ref 0.50–1.10)
Calcium: 8.7 mg/dL (ref 8.4–10.5)
GFR calc Af Amer: 28 mL/min — ABNORMAL LOW (ref >90)
GFR, EST NON AFRICAN AMERICAN: 24 mL/min — AB (ref >90)
GLUCOSE: 79 mg/dL (ref 70–99)
Potassium: 4.1 mEq/L (ref 3.7–5.3)
Sodium: 138 mEq/L (ref 137–147)
Total Protein: 6 g/dL (ref 6.0–8.3)

## 2013-07-20 LAB — CBC
HCT: 39.6 % (ref 36.0–46.0)
Hemoglobin: 13.3 g/dL (ref 12.0–15.0)
MCH: 25.5 pg — ABNORMAL LOW (ref 26.0–34.0)
MCHC: 33.6 g/dL (ref 30.0–36.0)
MCV: 76 fL — ABNORMAL LOW (ref 78.0–100.0)
PLATELETS: 195 10*3/uL (ref 150–400)
RBC: 5.21 MIL/uL — ABNORMAL HIGH (ref 3.87–5.11)
RDW: 15.4 % (ref 11.5–15.5)
WBC: 4.5 10*3/uL (ref 4.0–10.5)

## 2013-07-20 LAB — GLUCOSE, CAPILLARY
GLUCOSE-CAPILLARY: 121 mg/dL — AB (ref 70–99)
GLUCOSE-CAPILLARY: 144 mg/dL — AB (ref 70–99)
Glucose-Capillary: 135 mg/dL — ABNORMAL HIGH (ref 70–99)
Glucose-Capillary: 83 mg/dL (ref 70–99)
Glucose-Capillary: 121 mg/dL — ABNORMAL HIGH (ref 70–99)

## 2013-07-20 LAB — PROTIME-INR
INR: 1.43 (ref 0.00–1.49)
Prothrombin Time: 17.1 seconds — ABNORMAL HIGH (ref 11.6–15.2)

## 2013-07-20 LAB — HEPARIN LEVEL (UNFRACTIONATED)
HEPARIN UNFRACTIONATED: 0.56 [IU]/mL (ref 0.30–0.70)
Heparin Unfractionated: 0.83 IU/mL — ABNORMAL HIGH (ref 0.30–0.70)
Heparin Unfractionated: 0.49 IU/mL (ref 0.30–0.70)

## 2013-07-20 MED ORDER — WARFARIN SODIUM 7.5 MG PO TABS
7.5000 mg | ORAL_TABLET | Freq: Once | ORAL | Status: AC
  Administered 2013-07-20: 7.5 mg via ORAL
  Filled 2013-07-20: qty 1

## 2013-07-20 NOTE — Progress Notes (Signed)
ANTICOAGULATION CONSULT NOTE - Follow Up Consult  Pharmacy Consult for Heparin  Indication: apical LV and RV clot  No Known Allergies  Patient Measurements: Height: 5\' 4"  (162.6 cm) Weight: 172 lb 8 oz (78.245 kg) (a scale) IBW/kg (Calculated) : 54.7  Vital Signs: Temp: 97.4 F (36.3 C) (01/10 0140) Temp src: Oral (01/10 0140) BP: 109/72 mmHg (01/10 0140) Pulse Rate: 58 (01/10 0140)  Labs:  Recent Labs  07/18/13 1456 07/18/13 1611 07/18/13 1821 07/18/13 2135 07/19/13 0529 07/20/13 0340  HGB 16.0*  --   --  15.2*  --  13.3  HCT 48.1*  --   --  45.4  --  39.6  PLT 210  --   --  217  --  195  LABPROT  --   --   --   --   --  17.1*  INR  --   --   --   --   --  1.43  HEPARINUNFRC  --   --   --   --   --  0.56  CREATININE  --   --  1.62*  --  1.88* 2.12*  TROPONINI  --  0.36*  --   --   --   --    Estimated Creatinine Clearance: 28.2 ml/min (by C-G formula based on Cr of 2.12).  Medications:  Heparin 1100 units/hr  Assessment: 63 y/o F on heparin/warfarin for apical LV/RV clot. HL is 0.56. Other labs as above.   Goal of Therapy:  Heparin level 0.3-0.7 units/ml Monitor platelets by anticoagulation protocol: Yes   Plan:  -Continue heparin at 1100 units/hr -Confirmatory HL at 1200 -Daily CBC/HL -Monitor for bleeding -Warfarin per previous note  Abran DukeLedford, Silas Sedam 07/20/2013,5:43 AM

## 2013-07-20 NOTE — Progress Notes (Signed)
ANTICOAGULATION CONSULT NOTE - Follow Up Consult  Pharmacy Consult for Heparin + Coumadin Indication: apical LV and RV clot  No Known Allergies  Patient Measurements: Height: 5\' 4"  (162.6 cm) Weight: 173 lb 14.4 oz (78.881 kg) (scale) IBW/kg (Calculated) : 54.7 Heparin dosing weight: 71.4 kg  Vital Signs: Temp: 97.1 F (36.2 C) (01/10 0900) Temp src: Oral (01/10 0900) BP: 97/59 mmHg (01/10 0900) Pulse Rate: 55 (01/10 0900)  Labs:  Recent Labs  07/18/13 1456 07/18/13 1611 07/18/13 1821 07/18/13 2135 07/19/13 0529 07/20/13 0340 07/20/13 1155  HGB 16.0*  --   --  15.2*  --  13.3  --   HCT 48.1*  --   --  45.4  --  39.6  --   PLT 210  --   --  217  --  195  --   LABPROT  --   --   --   --   --  17.1*  --   INR  --   --   --   --   --  1.43  --   HEPARINUNFRC  --   --   --   --   --  0.56 0.83*  CREATININE  --   --  1.62*  --  1.88* 2.12*  --   TROPONINI  --  0.36*  --   --   --   --   --    Estimated Creatinine Clearance: 28.3 ml/min (by C-G formula based on Cr of 2.12).  Medications:  Heparin 1100 units/hr  Assessment: 63 y/o F on heparin/warfarin for apical LV/RV clot.  INR 01/10 1.43 (after 7.5mg  x 1) 01/10 0549 HL 0.56, continue at 1100 units/hr 01/10 1155 HL 0.83  Goal of Therapy:  Heparin level 0.3-0.7 units/ml Monitor platelets by anticoagulation protocol: Yes   Plan:  Reduce to heparin 950 units/hr Re-check heparin level at 2130 Coumadin 7.5 mg today Check daily PT/INR.  Daily HL/CBC   Cornisha Zetino B. Artelia Larocheung, PharmD Clinical Pharmacist - Resident Pager: [phone] Phone: [phone] 07/20/2013 1:26 PM

## 2013-07-20 NOTE — Progress Notes (Addendum)
ANTICOAGULATION CONSULT NOTE - Follow Up Consult  Pharmacy Consult for Heparin  Indication: apical LV and RV clot  No Known Allergies  Patient Measurements: Height: 5\' 4"  (162.6 cm) Weight: 173 lb 14.4 oz (78.881 kg) (scale A) IBW/kg (Calculated) : 54.7  Vital Signs: Temp: 97.9 F (36.6 C) (01/10 2133) Temp src: Oral (01/10 2133) BP: 123/78 mmHg (01/10 2133) Pulse Rate: 70 (01/10 2133)  Labs:  Recent Labs  07/18/13 1456 07/18/13 1611 07/18/13 1821 07/18/13 2135 07/19/13 0529 07/20/13 0340 07/20/13 1155 07/20/13 2127  HGB 16.0*  --   --  15.2*  --  13.3  --   --   HCT 48.1*  --   --  45.4  --  39.6  --   --   PLT 210  --   --  217  --  195  --   --   LABPROT  --   --   --   --   --  17.1*  --   --   INR  --   --   --   --   --  1.43  --   --   HEPARINUNFRC  --   --   --   --   --  0.56 0.83* 0.49  CREATININE  --   --  1.62*  --  1.88* 2.12*  --   --   TROPONINI  --  0.36*  --   --   --   --   --   --    Estimated Creatinine Clearance: 28.3 ml/min (by C-G formula based on Cr of 2.12).  Medications:  Heparin 950 units/hr  Assessment: 63 y/o F on heparin/warfarin for apical LV/RV clot. HL is 0.49 after rate decrease. Other labs as above.   Goal of Therapy:  Heparin level 0.3-0.7 units/ml Monitor platelets by anticoagulation protocol: Yes   Plan:  -Continue heparin at 950 units/hr -F/U HL with AM labs -Daily CBC/HL -Monitor for bleeding -Warfarin per previous note  Vinetta Brach 07/20/2013,11:04 PM  07/21/2013 5:02 AM AM HL 0.42, continue heparin at 950 units/hr, daily CBC/HL JLedford, PharmD

## 2013-07-20 NOTE — Progress Notes (Signed)
Subjective:  Patient denies any chest pain states breathing is improved complaints of persistent neck swelling. Noted to have LV thrombus started on heparin and Coumadin yesterday. BP remained soft and renal function gradually worsening    Objective:  Vital Signs in the last 24 hours: Temp:  [97.1 F (36.2 C)-97.8 F (36.6 C)] 97.1 F (36.2 C) (01/10 0900) Pulse Rate:  [55-76] 55 (01/10 0900) Resp:  [18-20] 20 (01/10 0900) BP: (90-111)/(55-72) 97/59 mmHg (01/10 0900) SpO2:  [96 %-100 %] 100 % (01/10 0900) Weight:  [78.881 kg (173 lb 14.4 oz)] 78.881 kg (173 lb 14.4 oz) (01/10 0610)  Intake/Output from previous day: 01/09 0701 - 01/10 0700 In: 723 [P.O.:720; I.V.:3] Out: 2100 [Urine:2100] Intake/Output from this shift: Total I/O In: 240 [P.O.:240] Out: 200 [Urine:200]  Physical Exam: Neck: JVD - 8 cm above sternal notch, no adenopathy, no carotid bruit and supple, symmetrical, trachea midline Lungs: Bilateral rales noted Heart: regular rate and rhythm, S1, S2 normal, no murmur, click, rub or gallop Abdomen: soft, non-tender; bowel sounds normal; no masses,  no organomegaly Extremities: No clubbing cyanosis 4+ edema noted  Lab Results:  Recent Labs  07/18/13 2135 07/20/13 0340  WBC 4.5 4.5  HGB 15.2* 13.3  PLT 217 195    Recent Labs  07/19/13 0529 07/20/13 0340  NA 138 138  K 4.8 4.1  CL 100 100  CO2 24 24  GLUCOSE 135* 79  BUN 63* 64*  CREATININE 1.88* 2.12*    Recent Labs  07/18/13 1611  TROPONINI 0.36*   Hepatic Function Panel  Recent Labs  07/20/13 0340  PROT 6.0  ALBUMIN 2.5*  AST 20  ALT 16  ALKPHOS 67  BILITOT 0.7   No results found for this basename: CHOL,  in the last 72 hours No results found for this basename: PROTIME,  in the last 72 hours  Imaging: Imaging results have been reviewed and Dg Chest 2 View  07/18/2013   CLINICAL DATA:  Shortness of breath and weakness with fluid on lungs for 3 days  EXAM: CHEST  2 VIEW  COMPARISON:   12/30/2012  FINDINGS: Moderately severe cardiac enlargement. Bilateral perihilar moderate vascular congestion. Right lung is clear. On the left side, there are increased linear densities in the retrocardiac portion of the lower lobe. There appears to be a small left pleural effusion.  Coronary arterial calcification is noted.  IMPRESSION: Moderately severe cardiac enlargement with vascular congestion. Focal opacity with tiny left effusion on the left suggests left-sided infiltrate possibly from pneumonia or aspiration. Asymmetric pulmonary edema is considered to be less likely.   Electronically Signed   By: Esperanza Heiraymond  Rubner M.D.   On: 07/18/2013 15:29    Cardiac Studies:  Assessment/Plan:  Acute decompensated systolic heart failure Valvular heart disease  LV apical thrombus Coronary artery disease status post PCI to LAD in the past Possible pneumonia Hypertension Chronic kidney disease stage IV Plan DC Norvasc and clonidine in view of depressed LV systolic function and hypotension Hold ACE inhibitors for now in view of worsening renal function Check labs in a.m. Will increase Lasix tomorrow if renal function remained stable   LOS: 2 days    Shelli Portilla N 07/20/2013, 11:05 AM

## 2013-07-20 NOTE — Progress Notes (Signed)
2-3 + pitting edema in lower legs.  Ambulates in room to and from bathroom.  IV site unremarkable.  Alert and oriented.

## 2013-07-21 LAB — COMPREHENSIVE METABOLIC PANEL
ALK PHOS: 71 U/L (ref 39–117)
ALT: 16 U/L (ref 0–35)
AST: 20 U/L (ref 0–37)
Albumin: 2.6 g/dL — ABNORMAL LOW (ref 3.5–5.2)
BUN: 63 mg/dL — ABNORMAL HIGH (ref 6–23)
CO2: 26 mEq/L (ref 19–32)
Calcium: 8.6 mg/dL (ref 8.4–10.5)
Chloride: 96 mEq/L (ref 96–112)
Creatinine, Ser: 2.26 mg/dL — ABNORMAL HIGH (ref 0.50–1.10)
GFR calc non Af Amer: 22 mL/min — ABNORMAL LOW (ref >90)
GFR, EST AFRICAN AMERICAN: 26 mL/min — AB (ref >90)
Glucose, Bld: 76 mg/dL (ref 70–99)
POTASSIUM: 4.6 meq/L (ref 3.7–5.3)
SODIUM: 136 meq/L — AB (ref 137–147)
TOTAL PROTEIN: 6.3 g/dL (ref 6.0–8.3)
Total Bilirubin: 0.6 mg/dL (ref 0.3–1.2)

## 2013-07-21 LAB — PROTIME-INR
INR: 1.37 (ref 0.00–1.49)
Prothrombin Time: 16.5 seconds — ABNORMAL HIGH (ref 11.6–15.2)

## 2013-07-21 LAB — GLUCOSE, CAPILLARY
GLUCOSE-CAPILLARY: 160 mg/dL — AB (ref 70–99)
GLUCOSE-CAPILLARY: 168 mg/dL — AB (ref 70–99)
Glucose-Capillary: 63 mg/dL — ABNORMAL LOW (ref 70–99)
Glucose-Capillary: 64 mg/dL — ABNORMAL LOW (ref 70–99)
Glucose-Capillary: 129 mg/dL — ABNORMAL HIGH (ref 70–99)
Glucose-Capillary: 133 mg/dL — ABNORMAL HIGH (ref 70–99)

## 2013-07-21 LAB — CBC
HEMATOCRIT: 41 % (ref 36.0–46.0)
Hemoglobin: 13.3 g/dL (ref 12.0–15.0)
MCH: 25 pg — ABNORMAL LOW (ref 26.0–34.0)
MCHC: 32.4 g/dL (ref 30.0–36.0)
MCV: 77.2 fL — ABNORMAL LOW (ref 78.0–100.0)
Platelets: 218 10*3/uL (ref 150–400)
RBC: 5.31 MIL/uL — AB (ref 3.87–5.11)
RDW: 15.7 % — ABNORMAL HIGH (ref 11.5–15.5)
WBC: 3.3 10*3/uL — AB (ref 4.0–10.5)

## 2013-07-21 LAB — HEPARIN LEVEL (UNFRACTIONATED): HEPARIN UNFRACTIONATED: 0.42 [IU]/mL (ref 0.30–0.70)

## 2013-07-21 LAB — PRO B NATRIURETIC PEPTIDE: Pro B Natriuretic peptide (BNP): 6703 pg/mL — ABNORMAL HIGH (ref 0–125)

## 2013-07-21 MED ORDER — FUROSEMIDE 10 MG/ML IJ SOLN
40.0000 mg | Freq: Two times a day (BID) | INTRAMUSCULAR | Status: DC
  Administered 2013-07-21 – 2013-07-24 (×7): 40 mg via INTRAVENOUS
  Filled 2013-07-21 (×9): qty 4

## 2013-07-21 MED ORDER — WARFARIN SODIUM 10 MG PO TABS
10.0000 mg | ORAL_TABLET | Freq: Once | ORAL | Status: AC
  Administered 2013-07-21: 18:00:00 10 mg via ORAL
  Filled 2013-07-21: qty 1

## 2013-07-21 NOTE — Progress Notes (Signed)
Subjective:  Patient denies any chest pain states breathing and leg swelling has improved  Objective:  Vital Signs in the last 24 hours: Temp:  [97.7 F (36.5 C)-97.9 F (36.6 C)] 97.7 F (36.5 C) (01/11 0534) Pulse Rate:  [67-76] 76 (01/11 0935) Resp:  [20] 20 (01/11 0534) BP: (121-140)/(68-78) 121/68 mmHg (01/11 0935) SpO2:  [100 %] 100 % (01/11 0534) Weight:  [76.5 kg (168 lb 10.4 oz)] 76.5 kg (168 lb 10.4 oz) (01/11 0534)  Intake/Output from previous day: 01/10 0701 - 01/11 0700 In: 480 [P.O.:480] Out: 1952 [Urine:1950; Stool:2] Intake/Output from this shift:    Physical Exam: Neck: JVD - 8 cm above sternal notch, no adenopathy, no carotid bruit and supple, symmetrical, trachea midline Lungs: Bibasilar rales noted Heart: regular rate and rhythm, S1, S2 normal and Soft systolic murmur and S3 gallop noted Abdomen: soft, non-tender; bowel sounds normal; no masses,  no organomegaly Extremities: No clubbing cyanosis 2+ edema noted  Lab Results:  Recent Labs  07/20/13 0340 07/21/13 0405  WBC 4.5 3.3*  HGB 13.3 13.3  PLT 195 218    Recent Labs  07/20/13 0340 07/21/13 0405  NA 138 136*  K 4.1 4.6  CL 100 96  CO2 24 26  GLUCOSE 79 76  BUN 64* 63*  CREATININE 2.12* 2.26*    Recent Labs  07/18/13 1611  TROPONINI 0.36*   Hepatic Function Panel  Recent Labs  07/21/13 0405  PROT 6.3  ALBUMIN 2.6*  AST 20  ALT 16  ALKPHOS 71  BILITOT 0.6   No results found for this basename: CHOL,  in the last 72 hours No results found for this basename: PROTIME,  in the last 72 hours  Imaging: Imaging results have been reviewed and No results found.  Cardiac Studies:  Assessment/Plan:  Acute decompensated systolic heart failure  Valvular heart disease  LV apical thrombus  Coronary artery disease status post PCI to LAD in the past  Possible pneumonia  Hypertension  Diabetes mellitus Chronic kidney disease stage IV  plan Change by mouth Lasix to IV as per  orders : Glipizide in view of hypoglycemia Continue sliding scale insulin  LOS: 3 days    Mariselda Badalamenti N 07/21/2013, 11:21 AM

## 2013-07-21 NOTE — Progress Notes (Signed)
Patient's CBG 63. Patient was asymptomatic. Will recheck after patient eats.

## 2013-07-21 NOTE — Discharge Instructions (Signed)
Information on my medicine - Coumadin   (Warfarin)  This medication education was reviewed with me or my healthcare representative as part of my discharge preparation.  The pharmacist that spoke with me during my hospital stay was:  Starr LakeOung, Nickolis Diel B, [facility]PH  Why was Coumadin prescribed for you? Coumadin was prescribed for you because you have a blood clot or a medical condition that can cause an increased risk of forming blood clots. Blood clots can cause serious health problems by blocking the flow of blood to the heart, lung, or brain. Coumadin can prevent harmful blood clots from forming. As a reminder your indication for Coumadin is:   Blood Clotting Disorder  What test will check on my response to Coumadin? While on Coumadin (warfarin) you will need to have an INR test regularly to ensure that your dose is keeping you in the desired range. The INR (international normalized ratio) number is calculated from the result of the laboratory test called prothrombin time (PT).  If an INR APPOINTMENT HAS NOT ALREADY BEEN MADE FOR YOU please schedule an appointment to have this lab work done by your health care provider within 7 days. Your INR goal is usually a number between:  2 to 3 or your provider may give you a more narrow range like 2-2.5.  Ask your health care provider during an office visit what your goal INR is.  What  do you need to  know  About  COUMADIN? Take Coumadin (warfarin) exactly as prescribed by your healthcare provider about the same time each day.  DO NOT stop taking without talking to the doctor who prescribed the medication.  Stopping without other blood clot prevention medication to take the place of Coumadin may increase your risk of developing a new clot or stroke.  Get refills before you run out.  What do you do if you miss a dose? If you miss a dose, take it as soon as you remember on the same day then continue your regularly scheduled regimen the next day.  Do not take two doses  of Coumadin at the same time.  Important Safety Information A possible side effect of Coumadin (Warfarin) is an increased risk of bleeding. You should call your healthcare provider right away if you experience any of the following:   Bleeding from an injury or your nose that does not stop.   Unusual colored urine (red or dark brown) or unusual colored stools (red or black).   Unusual bruising for unknown reasons.   A serious fall or if you hit your head (even if there is no bleeding).  Some foods or medicines interact with Coumadin (warfarin) and might alter your response to warfarin. To help avoid this:   Eat a balanced diet, maintaining a consistent amount of Vitamin K.   Notify your provider about major diet changes you plan to make.   Avoid alcohol or limit your intake to 1 drink for women and 2 drinks for men per day. (1 drink is 5 oz. wine, 12 oz. beer, or 1.5 oz. liquor.)  Make sure that ANY health care provider who prescribes medication for you knows that you are taking Coumadin (warfarin).  Also make sure the healthcare provider who is monitoring your Coumadin knows when you have started a new medication including herbals and non-prescription products.  Coumadin (Warfarin)  Major Drug Interactions  Increased Warfarin Effect Decreased Warfarin Effect  Alcohol (large quantities) Antibiotics (esp. Septra/Bactrim, Flagyl, Cipro) Amiodarone (Cordarone) Aspirin (ASA) Cimetidine (Tagamet)  Megestrol (Megace) °NSAIDs (ibuprofen, naproxen, etc.) °Piroxicam (Feldene) °Propafenone (Rythmol SR) °Propranolol (Inderal) °Isoniazid (INH) °Posaconazole (Noxafil) Barbiturates (Phenobarbital) °Carbamazepine (Tegretol) °Chlordiazepoxide (Librium) °Cholestyramine (Questran) °Griseofulvin °Oral Contraceptives °Rifampin °Sucralfate (Carafate) °Vitamin K  ° °Coumadin® (Warfarin) Major Herbal Interactions  °Increased Warfarin Effect Decreased Warfarin Effect  °Garlic °Ginseng °Ginkgo biloba Coenzyme  Q10 °Green tea °St. John’s wort   ° °Coumadin® (Warfarin) FOOD Interactions  °Eat a consistent number of servings per week of foods HIGH in Vitamin K °(1 serving = ½ cup)  °Collards (cooked, or boiled & drained) °Kale (cooked, or boiled & drained) °Mustard greens (cooked, or boiled & drained) °Parsley *serving size only = ¼ cup °Spinach (cooked, or boiled & drained) °Swiss chard (cooked, or boiled & drained) °Turnip greens (cooked, or boiled & drained)  °Eat a consistent number of servings per week of foods MEDIUM-HIGH in Vitamin K °(1 serving = 1 cup)  °Asparagus (cooked, or boiled & drained) °Broccoli (cooked, boiled & drained, or raw & chopped) °Brussel sprouts (cooked, or boiled & drained) *serving size only = ½ cup °Lettuce, raw (green leaf, endive, romaine) °Spinach, raw °Turnip greens, raw & chopped  ° °These websites have more information on Coumadin (warfarin):  www.coumadin.com; °www.ahrq.gov/consumer/coumadin.htm; ° ° ° °

## 2013-07-21 NOTE — Progress Notes (Signed)
ANTICOAGULATION CONSULT NOTE - Follow Up Consult  Pharmacy Consult for Heparin + Coumadin Indication: apical LV and RV clot  No Known Allergies  Patient Measurements: Height: 5\' 4"  (162.6 cm) Weight: 173 lb 14.4 oz (78.881 kg) (scale) IBW/kg (Calculated) : 54.7 Heparin dosing weight: 71.4 kg  Vital Signs: Temp: 97.7 F (36.5 C) (01/11 0534) Temp src: Oral (01/11 0534) BP: 121/68 mmHg (01/11 0935) Pulse Rate: 76 (01/11 0935)  Labs:  Recent Labs  07/18/13 1611  07/18/13 2135 07/19/13 0529  07/20/13 0340 07/20/13 1155 07/20/13 2127 07/21/13 0405  HGB  --   --  15.2*  --   --  13.3  --   --  13.3  HCT  --   --  45.4  --   --  39.6  --   --  41.0  PLT  --   --  217  --   --  195  --   --  218  LABPROT  --   --   --   --   --  17.1*  --   --  16.5*  INR  --   --   --   --   --  1.43  --   --  1.37  HEPARINUNFRC  --   --   --   --   < > 0.56 0.83* 0.49 0.42  CREATININE  --   < >  --  1.88*  --  2.12*  --   --  2.26*  TROPONINI 0.36*  --   --   --   --   --   --   --   --   < > = values in this interval not displayed. Estimated Creatinine Clearance: 26.2 ml/min (by C-G formula based on Cr of 2.26).  Medications: Heparin 950 units/hr  Assessment: 63 y/o F on heparin/warfarin for apical LV/RV clot. CBC stable, plts wnl. No bleeding noted.  01/10 0549 HL 0.56, continue at 1100 units/hr 01/10 1155 HL 0.83 change to 950 units/hr 01/10 2305 HL 0.49 continue 950 units/hr 01/11 0405 HL 0.42  INR 01/10 1.43 (after 7.5mg  x 1) INR 01/11 1.37 (after 7.5mg  x 2)  Goal of Therapy:  Heparin level 0.3-0.7 units/ml Monitor platelets by anticoagulation protocol: Yes   Plan:  Continue heparin 950 units/hr Daily HL/CBC Coumadin 10 mg x 1 tonight Check daily PT/INR  Kody Brandl B. Artelia Larocheung, PharmD Clinical Pharmacist - Resident Pager: [phone] 07/21/2013 9:59 AM

## 2013-07-21 NOTE — Progress Notes (Signed)
Given Tyleonol for c/o back pain.  Up in chair.

## 2013-07-22 ENCOUNTER — Inpatient Hospital Stay (HOSPITAL_COMMUNITY): Payer: No Typology Code available for payment source

## 2013-07-22 LAB — CBC
HEMATOCRIT: 42.9 % (ref 36.0–46.0)
Hemoglobin: 13.5 g/dL (ref 12.0–15.0)
MCH: 24.1 pg — AB (ref 26.0–34.0)
MCHC: 31.5 g/dL (ref 30.0–36.0)
MCV: 76.5 fL — AB (ref 78.0–100.0)
Platelets: 209 10*3/uL (ref 150–400)
RBC: 5.61 MIL/uL — ABNORMAL HIGH (ref 3.87–5.11)
RDW: 15.6 % — ABNORMAL HIGH (ref 11.5–15.5)
WBC: 3.7 10*3/uL — ABNORMAL LOW (ref 4.0–10.5)

## 2013-07-22 LAB — GLUCOSE, CAPILLARY
GLUCOSE-CAPILLARY: 124 mg/dL — AB (ref 70–99)
GLUCOSE-CAPILLARY: 97 mg/dL (ref 70–99)
Glucose-Capillary: 183 mg/dL — ABNORMAL HIGH (ref 70–99)
Glucose-Capillary: 78 mg/dL (ref 70–99)

## 2013-07-22 LAB — BASIC METABOLIC PANEL
BUN: 59 mg/dL — ABNORMAL HIGH (ref 6–23)
CHLORIDE: 93 meq/L — AB (ref 96–112)
CO2: 23 mEq/L (ref 19–32)
Calcium: 8.9 mg/dL (ref 8.4–10.5)
Creatinine, Ser: 2.1 mg/dL — ABNORMAL HIGH (ref 0.50–1.10)
GFR calc Af Amer: 28 mL/min — ABNORMAL LOW (ref >90)
GFR calc non Af Amer: 24 mL/min — ABNORMAL LOW (ref >90)
GLUCOSE: 123 mg/dL — AB (ref 70–99)
Potassium: 4.2 mEq/L (ref 3.7–5.3)
Sodium: 132 mEq/L — ABNORMAL LOW (ref 137–147)

## 2013-07-22 LAB — PROTIME-INR
INR: 1.51 — AB (ref 0.00–1.49)
Prothrombin Time: 17.8 seconds — ABNORMAL HIGH (ref 11.6–15.2)

## 2013-07-22 LAB — PRO B NATRIURETIC PEPTIDE: Pro B Natriuretic peptide (BNP): 9121 pg/mL — ABNORMAL HIGH (ref 0–125)

## 2013-07-22 LAB — HEPARIN LEVEL (UNFRACTIONATED): HEPARIN UNFRACTIONATED: 0.3 [IU]/mL (ref 0.30–0.70)

## 2013-07-22 MED ORDER — HEPARIN (PORCINE) IN NACL 100-0.45 UNIT/ML-% IJ SOLN
1050.0000 [IU]/h | INTRAMUSCULAR | Status: DC
  Administered 2013-07-22 (×2): 1050 [IU]/h via INTRAVENOUS

## 2013-07-22 MED ORDER — WARFARIN SODIUM 10 MG PO TABS
10.0000 mg | ORAL_TABLET | Freq: Once | ORAL | Status: AC
  Administered 2013-07-22: 10 mg via ORAL
  Filled 2013-07-22: qty 1

## 2013-07-22 MED ORDER — ALBUMIN HUMAN 25 % IV SOLN
12.5000 g | Freq: Once | INTRAVENOUS | Status: AC
  Administered 2013-07-22: 12.5 g via INTRAVENOUS
  Filled 2013-07-22 (×3): qty 50

## 2013-07-22 NOTE — Care Management Note (Addendum)
  Page 2 of 2   07/23/2013     11:49:32 AM   CARE MANAGEMENT NOTE 07/23/2013  Patient:  [name],[name]   Account Number:  [id number]  Date Initiated:  07/22/2013  Documentation initiated by:  Gerhard Rappaport  Subjective/Objective Assessment:   Admitted with weakness  Acute systolic heart failure  Possible pneumonia     Action/Plan:   CM will continue to follow for dispositon needs   Anticipated DC Date:  07/23/2013   Anticipated DC Plan:  HOME/SELF CARE         Choice offered to / List presented to:             Status of service:  Completed, signed off Medicare Important Message given?   (If response is "NO", the following Medicare IM given date fields will be blank) Date Medicare IM given:   Date Additional Medicare IM given:    Discharge Disposition:    Per UR Regulation:  Reviewed for med. necessity/level of care/duration of stay  If discussed at Long Length of Stay Meetings, dates discussed:    Comments:  07/23/2013 Last UR date 07/19/2013 IV Lasix ADD:  +3 Social:  Lives at home where patient is a PCG to husband/new amputee 04/14/2014 and dependent on wife for all [facility] mgmt and transportation.  Patient also provides supportive care to DTR activley [facility] and cares for 4yo granddaughter.  Patient admitts to non-compliance with her own self-care and forgets to take meds or stop and eat.  Admitts to weight loss from hx/o 170s down to 140s. PCP:  Dr. Algie CofferKadakia - last appt 10/2012 and non-compliant with f/u appts thereafter. Patient states her health is failing d/t all the stress she is under and unable to care for herself. Husband is 62yo / active with [facility] and [facility] pending status. [facility] will provide husband transportation services to appts which will help patient. Transportation:  patient drives Meds:  covered by [employer]. * CM encouraged use of calendar as appt reminder CM encoruagage use of medication box (which patient has in the  home and is not using) CM discussed importance of compliance with meds, appts and provided tips for reminding such as setting by toothbrush/coffee pot CM instructed if s/s of forgetting things does not improve with special interventions to report to PCP on f/u appts. ADD: 1-2 days Disposition Plan:  Home / self care - High Risk for Non-compliance however, Patient is not homebound. Plan:  Donato Schultzrystal Dellas Guard RN, BSN, MSHL, CCM 07/23/2013    07/22/2013 Last UR date 07/19/2013 IV Lasix ADD:  +3 Plan:  D/C needs undetermined at this time. Solei Wubben RN, BSN, [place]HL, [place]M 07/22/2013

## 2013-07-22 NOTE — Progress Notes (Signed)
Subjective:  Feeling better. No chest pain. Afebrile. INR-1.51  Objective:  Vital Signs in the last 24 hours: Temp:  [97.3 F (36.3 C)-98.7 F (37.1 C)] 97.6 F (36.4 C) (01/12 0500) Pulse Rate:  [62-76] 62 (01/12 0500) Cardiac Rhythm:  [-] Normal sinus rhythm (01/12 0827) Resp:  [18-20] 20 (01/12 0500) BP: (108-166)/(64-78) 166/78 mmHg (01/12 0500) SpO2:  [100 %] 100 % (01/12 0500) Weight:  [75.025 kg (165 lb 6.4 oz)] 75.025 kg (165 lb 6.4 oz) (01/12 0500)  Physical Exam: BP Readings from Last 1 Encounters:  07/22/13 166/78     Wt Readings from Last 1 Encounters:  07/22/13 75.025 kg (165 lb 6.4 oz)    Weight change: -1.475 kg (-3 lb 4 oz)  HEENT: [place]/AT, Eyes-Brown, PERL, EOMI, Conjunctiva-Pink, Sclera-Non-icteric Neck: No JVD, No bruit, Trachea midline. Lungs:  Clearing, Bilateral. Cardiac:  Regular rhythm, normal S1 and S2, no S3. II/VI systolic murmur. Abdomen:  Soft, non-tender. Extremities:  2 + edema present. No cyanosis. No clubbing. CNS: AxOx3, Cranial nerves grossly intact, moves all 4 extremities. Right handed. Skin: Warm and dry.   Intake/Output from previous day: 01/11 0701 - 01/12 0700 In: 720 [P.O.:720] Out: 3003 [Urine:3000; Stool:3]    Lab Results: BMET    Component Value Date/Time   NA 132* 07/22/2013 0500   K 4.2 07/22/2013 0500   CL 93* 07/22/2013 0500   CO2 23 07/22/2013 0500   GLUCOSE 123* 07/22/2013 0500   BUN 59* 07/22/2013 0500   CREATININE 2.10* 07/22/2013 0500   CALCIUM 8.9 07/22/2013 0500   GFRNONAA 24* 07/22/2013 0500   GFRAA 28* 07/22/2013 0500   CBC    Component Value Date/Time   WBC 3.7* 07/22/2013 0500   RBC 5.61* 07/22/2013 0500   HGB 13.5 07/22/2013 0500   HCT 42.9 07/22/2013 0500   PLT 209 07/22/2013 0500   MCV 76.5* 07/22/2013 0500   MCH 24.1* 07/22/2013 0500   MCHC 31.5 07/22/2013 0500   RDW 15.6* 07/22/2013 0500   LYMPHSABS 1.4 12/31/2012 0008   MONOABS 0.6 12/31/2012 0008   EOSABS 0.1 12/31/2012 0008   BASOSABS 0.0 12/31/2012 0008    CARDIAC ENZYMES Lab Results  Component Value Date   CKTOTAL 59 09/07/2012   CKMB 3.2 09/07/2012   TROPONINI 0.36* 07/18/2013    Scheduled Meds: . albumin human  12.5 g Intravenous Once  . aspirin EC  81 mg Oral Daily  . furosemide  40 mg Intravenous BID  . gatifloxacin  1 drop Both Eyes QID  . glipiZIDE  5 mg Oral BID AC  . insulin aspart  0-9 Units Subcutaneous TID WC  . isosorbide-hydrALAZINE  1 tablet Oral TID  . levofloxacin  750 mg Intravenous Q48H  . metoprolol tartrate  25 mg Oral BID  . multivitamin with minerals  1 tablet Oral Daily  . pantoprazole  40 mg Oral Daily  . potassium chloride  10 mEq Oral BID  . simvastatin  10 mg Oral q1800  . sodium chloride  3 mL Intravenous Q12H  . spironolactone  25 mg Oral Daily  . Warfarin - Pharmacist Dosing Inpatient   Does not apply q1800   Continuous Infusions: . heparin 950 Units/hr (07/21/13 1258)   PRN Meds:.sodium chloride, acetaminophen, nitroGLYCERIN, ondansetron (ZOFRAN) IV, sodium chloride  Assessment/Plan: Acute systolic heart failure  Valvular heart disease LV and RV apical thrombus Possible pneumonia  CAD  S/P Stent in LAD  DM, II  Hypertension  Anxiety  Hypoalbuminemia  CKD, IV  Continue  diuresis and coumadin   LOS: 4 days    Tanya Cobb  MD  07/22/2013, 9:42 AM

## 2013-07-22 NOTE — Progress Notes (Signed)
ANTICOAGULATION CONSULT NOTE - Follow Up Consult  Pharmacy Consult:  Heparin + Coumadin Indication: apical LV and RV clot  No Known Allergies  Patient Measurements: Height: 5\' 4"  (162.6 cm) Weight: 173 lb 14.4 oz (78.881 kg) (scale) IBW/kg (Calculated) : 54.7 Heparin dosing weight: 71 kg  Vital Signs: Temp: 97.6 F (36.4 C) (01/12 0500) Temp src: Oral (01/12 0500) BP: 146/88 mmHg (01/12 0947) Pulse Rate: 77 (01/12 0947)  Labs:  Recent Labs  07/20/13 0340  07/20/13 2127 07/21/13 0405 07/22/13 0500  HGB 13.3  --   --  13.3 13.5  HCT 39.6  --   --  41.0 42.9  PLT 195  --   --  218 209  LABPROT 17.1*  --   --  16.5* 17.8*  INR 1.43  --   --  1.37 1.51*  HEPARINUNFRC 0.56  < > 0.49 0.42 0.30  CREATININE 2.12*  --   --  2.26* 2.10*  < > = values in this interval not displayed. Estimated Creatinine Clearance: 27.9 ml/min (by C-G formula based on Cr of 2.1).     Assessment: [age] YOF continues on IV heparin and Coumadin for apical LV and RV clots.  Heparin level toward the low end of goal and INR remains sub-therapeutic.  No bleeding reported.   Goal of Therapy:  Heparin level 0.3-0.7 units/ml Monitor platelets by anticoagulation protocol: Yes    Plan:  - Increase heparin gtt to 1050 units/hr - Coumadin 10mg  PO today - Daily HL / CBC / PT / INR    Jakorian Marengo D. Laney Potashang, PharmD, BCPS Pager:  [phone] 07/22/2013, 12:33 PM

## 2013-07-23 LAB — RENAL FUNCTION PANEL
Albumin: 2.8 g/dL — ABNORMAL LOW (ref 3.5–5.2)
BUN: 52 mg/dL — ABNORMAL HIGH (ref 6–23)
CALCIUM: 9.1 mg/dL (ref 8.4–10.5)
CHLORIDE: 96 meq/L (ref 96–112)
CO2: 25 meq/L (ref 19–32)
Creatinine, Ser: 2.05 mg/dL — ABNORMAL HIGH (ref 0.50–1.10)
GFR calc Af Amer: 29 mL/min — ABNORMAL LOW (ref >90)
GFR, EST NON AFRICAN AMERICAN: 25 mL/min — AB (ref >90)
Glucose, Bld: 106 mg/dL — ABNORMAL HIGH (ref 70–99)
Phosphorus: 3.7 mg/dL (ref 2.3–4.6)
Potassium: 4.4 mEq/L (ref 3.7–5.3)
Sodium: 136 mEq/L — ABNORMAL LOW (ref 137–147)

## 2013-07-23 LAB — PROTIME-INR
INR: 1.38 (ref 0.00–1.49)
INR: 1.38 (ref 0.00–1.49)
PROTHROMBIN TIME: 16.6 s — AB (ref 11.6–15.2)
Prothrombin Time: 16.6 seconds — ABNORMAL HIGH (ref 11.6–15.2)

## 2013-07-23 LAB — CBC
HCT: 43.6 % (ref 36.0–46.0)
Hemoglobin: 14.4 g/dL (ref 12.0–15.0)
MCH: 25 pg — ABNORMAL LOW (ref 26.0–34.0)
MCHC: 33 g/dL (ref 30.0–36.0)
MCV: 75.6 fL — AB (ref 78.0–100.0)
PLATELETS: 240 10*3/uL (ref 150–400)
RBC: 5.77 MIL/uL — ABNORMAL HIGH (ref 3.87–5.11)
RDW: 15.5 % (ref 11.5–15.5)
WBC: 4.9 10*3/uL (ref 4.0–10.5)

## 2013-07-23 LAB — GLUCOSE, CAPILLARY
GLUCOSE-CAPILLARY: 218 mg/dL — AB (ref 70–99)
Glucose-Capillary: 145 mg/dL — ABNORMAL HIGH (ref 70–99)
Glucose-Capillary: 136 mg/dL — ABNORMAL HIGH (ref 70–99)
Glucose-Capillary: 195 mg/dL — ABNORMAL HIGH (ref 70–99)

## 2013-07-23 LAB — HEPARIN LEVEL (UNFRACTIONATED)
HEPARIN UNFRACTIONATED: 0.2 [IU]/mL — AB (ref 0.30–0.70)
Heparin Unfractionated: 0.19 IU/mL — ABNORMAL LOW (ref 0.30–0.70)

## 2013-07-23 LAB — URIC ACID: URIC ACID, SERUM: 16.7 mg/dL — AB (ref 2.4–7.0)

## 2013-07-23 MED ORDER — ALLOPURINOL 100 MG PO TABS
100.0000 mg | ORAL_TABLET | Freq: Two times a day (BID) | ORAL | Status: DC
  Administered 2013-07-23 – 2013-07-25 (×5): 100 mg via ORAL
  Filled 2013-07-23 (×7): qty 1

## 2013-07-23 MED ORDER — HEPARIN (PORCINE) IN NACL 100-0.45 UNIT/ML-% IJ SOLN
1350.0000 [IU]/h | INTRAMUSCULAR | Status: DC
  Administered 2013-07-24 – 2013-07-25 (×2): 1350 [IU]/h via INTRAVENOUS
  Filled 2013-07-23 (×4): qty 250

## 2013-07-23 MED ORDER — MAGNESIUM OXIDE 400 (241.3 MG) MG PO TABS
400.0000 mg | ORAL_TABLET | Freq: Every day | ORAL | Status: DC
  Administered 2013-07-23 – 2013-07-25 (×3): 400 mg via ORAL
  Filled 2013-07-23 (×4): qty 1

## 2013-07-23 MED ORDER — WARFARIN SODIUM 2.5 MG PO TABS
12.5000 mg | ORAL_TABLET | Freq: Once | ORAL | Status: AC
  Administered 2013-07-23: 12.5 mg via ORAL
  Filled 2013-07-23: qty 1

## 2013-07-23 NOTE — Progress Notes (Signed)
Pt on heparin gtt @ 12.655ml/hr, pt oob ad lib, no c/o pain, vss, pt stable

## 2013-07-23 NOTE — Progress Notes (Signed)
Subjective:  Had good diuresis. Afebrile.  Objective:  Vital Signs in the last 24 hours: Temp:  [97.8 F (36.6 C)-98.3 F (36.8 C)] 98.3 F (36.8 C) (01/13 0616) Pulse Rate:  [62-77] 62 (01/13 0616) Cardiac Rhythm:  [-] Normal sinus rhythm (01/12 2010) Resp:  [20] 20 (01/13 0616) BP: (136-160)/(74-88) 143/74 mmHg (01/13 0616) SpO2:  [99 %-100 %] 99 % (01/13 0616) Weight:  [70.534 kg (155 lb 8 oz)] 70.534 kg (155 lb 8 oz) (01/13 16100616)  Physical Exam: BP Readings from Last 1 Encounters:  07/23/13 143/74     Wt Readings from Last 1 Encounters:  07/23/13 70.534 kg (155 lb 8 oz)    Weight change: -4.491 kg (-9 lb 14.4 oz)  HEENT: [place]/AT, Eyes-Brown, PERL, EOMI, Conjunctiva-Pink, Sclera-Non-icteric Neck: No JVD, No bruit, Trachea midline. Lungs:  Clear, Bilateral. Cardiac:  Regular rhythm, normal S1 and S2, no S3.  Abdomen:  Soft, non-tender. Extremities:  1-2 + edema present. No cyanosis. No clubbing. CNS: AxOx3, Cranial nerves grossly intact, moves all 4 extremities. Right handed. Skin: Warm and dry.   Intake/Output from previous day: 01/12 0701 - 01/13 0700 In: 2039.6 [P.O.:1200; I.V.:839.6] Out: 4901 [Urine:4900; Stool:1]    Lab Results: BMET    Component Value Date/Time   NA 136* 07/23/2013 0420   K 4.4 07/23/2013 0420   CL 96 07/23/2013 0420   CO2 25 07/23/2013 0420   GLUCOSE 106* 07/23/2013 0420   BUN 52* 07/23/2013 0420   CREATININE 2.05* 07/23/2013 0420   CALCIUM 9.1 07/23/2013 0420   GFRNONAA 25* 07/23/2013 0420   GFRAA 29* 07/23/2013 0420   CBC    Component Value Date/Time   WBC 4.9 07/23/2013 0420   RBC 5.77* 07/23/2013 0420   HGB 14.4 07/23/2013 0420   HCT 43.6 07/23/2013 0420   PLT 240 07/23/2013 0420   MCV 75.6* 07/23/2013 0420   MCH 25.0* 07/23/2013 0420   MCHC 33.0 07/23/2013 0420   RDW 15.5 07/23/2013 0420   LYMPHSABS 1.4 12/31/2012 0008   MONOABS 0.6 12/31/2012 0008   EOSABS 0.1 12/31/2012 0008   BASOSABS 0.0 12/31/2012 0008   CARDIAC ENZYMES Lab Results   Component Value Date   CKTOTAL 59 09/07/2012   CKMB 3.2 09/07/2012   TROPONINI 0.36* 07/18/2013    Scheduled Meds: . allopurinol  100 mg Oral BID  . aspirin EC  81 mg Oral Daily  . furosemide  40 mg Intravenous BID  . gatifloxacin  1 drop Both Eyes QID  . glipiZIDE  5 mg Oral BID AC  . insulin aspart  0-9 Units Subcutaneous TID WC  . isosorbide-hydrALAZINE  1 tablet Oral TID  . levofloxacin  750 mg Intravenous Q48H  . magnesium oxide  400 mg Oral Daily  . metoprolol tartrate  25 mg Oral BID  . multivitamin with minerals  1 tablet Oral Daily  . pantoprazole  40 mg Oral Daily  . potassium chloride  10 mEq Oral BID  . simvastatin  10 mg Oral q1800  . sodium chloride  3 mL Intravenous Q12H  . spironolactone  25 mg Oral Daily  . Warfarin - Pharmacist Dosing Inpatient   Does not apply q1800   Continuous Infusions: . heparin 1,050 Units/hr (07/22/13 1638)   PRN Meds:.sodium chloride, acetaminophen, nitroGLYCERIN, ondansetron (ZOFRAN) IV, sodium chloride  Assessment/Plan: Acute systolic heart failure  Valvular heart disease  LV and RV apical thrombus  Possible pneumonia  CAD  S/P Stent in LAD  DM, II  Hypertension  Anxiety  Hypoalbuminemia  CKD, III  Continue medical treatment. Use Ted hose. Increase activity.   LOS: 5 days    Tanya Cobb  MD  07/23/2013, 8:47 AM

## 2013-07-23 NOTE — Progress Notes (Signed)
ANTICOAGULATION CONSULT NOTE - Follow Up Consult  Pharmacy Consult:  Heparin + Coumadin Indication: apical LV and RV clot  No Known Allergies  Patient Measurements: Height: 5\' 4"  (162.6 cm) Weight: 173 lb 14.4 oz (78.881 kg) (scale) IBW/kg (Calculated) : 54.7 Heparin dosing weight: 71 kg  Vital Signs: Temp: 98.3 F (36.8 C) (01/13 0616) Temp src: Oral (01/13 0616) BP: 143/74 mmHg (01/13 0616) Pulse Rate: 62 (01/13 0616)  Labs:  Recent Labs  07/21/13 0405 07/22/13 0500 07/23/13 0420 07/23/13 0735  HGB 13.3 13.5 14.4  --   HCT 41.0 42.9 43.6  --   PLT 218 209 240  --   LABPROT 16.5* 17.8* 16.6* 16.6*  INR 1.37 1.51* 1.38 1.38  HEPARINUNFRC 0.42 0.30 0.19* 0.20*  CREATININE 2.26* 2.10* 2.05*  --    Estimated Creatinine Clearance: 27.8 ml/min (by C-G formula based on Cr of 2.05).     Assessment: [name] continues on IV heparin and Coumadin for apical LV and RV clots.  Heparin level sub-therapeutic today and INR has trended down.  No bleeding reported.   Goal of Therapy:  Heparin level 0.3-0.7 units/ml Monitor platelets by anticoagulation protocol: Yes    Plan:  - Increase heparin gtt to 1250 units/hr - Check 8 hr HL - Coumadin 12.5mg  PO today - Daily HL / CBC / PT / INR - F/U with abx stop date, recommend 7 days of therapy (stop date would be 07/24/13)    Darrnell Mangiaracina D. Laney Potashang, PharmD, BCPS Pager:  [phone] 07/23/2013, 10:14 AM

## 2013-07-23 NOTE — Plan of Care (Signed)
Problem: Phase I Progression Outcomes Goal: EF % per last Echo/documented,Core Reminder form on chart Outcome: Completed/Met Date Met:  07/23/13 EF 25-30%

## 2013-07-24 ENCOUNTER — Inpatient Hospital Stay (HOSPITAL_COMMUNITY): Payer: No Typology Code available for payment source

## 2013-07-24 LAB — CBC
HCT: 42.5 % (ref 36.0–46.0)
Hemoglobin: 13.9 g/dL (ref 12.0–15.0)
MCH: 25 pg — ABNORMAL LOW (ref 26.0–34.0)
MCHC: 32.7 g/dL (ref 30.0–36.0)
MCV: 76.3 fL — AB (ref 78.0–100.0)
Platelets: 230 10*3/uL (ref 150–400)
RBC: 5.57 MIL/uL — ABNORMAL HIGH (ref 3.87–5.11)
RDW: 15.5 % (ref 11.5–15.5)
WBC: 5.2 10*3/uL (ref 4.0–10.5)

## 2013-07-24 LAB — GLUCOSE, CAPILLARY
GLUCOSE-CAPILLARY: 216 mg/dL — AB (ref 70–99)
GLUCOSE-CAPILLARY: 183 mg/dL — AB (ref 70–99)
Glucose-Capillary: 92 mg/dL (ref 70–99)
Glucose-Capillary: 91 mg/dL (ref 70–99)

## 2013-07-24 LAB — HEPARIN LEVEL (UNFRACTIONATED): Heparin Unfractionated: 0.28 IU/mL — ABNORMAL LOW (ref 0.30–0.70)

## 2013-07-24 LAB — PROTIME-INR
INR: 1.63 — ABNORMAL HIGH (ref 0.00–1.49)
Prothrombin Time: 18.9 seconds — ABNORMAL HIGH (ref 11.6–15.2)

## 2013-07-24 MED ORDER — POTASSIUM CHLORIDE CRYS ER 10 MEQ PO TBCR
10.0000 meq | EXTENDED_RELEASE_TABLET | Freq: Every day | ORAL | Status: DC
  Administered 2013-07-25: 10 meq via ORAL
  Filled 2013-07-24: qty 1

## 2013-07-24 MED ORDER — WARFARIN SODIUM 7.5 MG PO TABS
7.5000 mg | ORAL_TABLET | Freq: Once | ORAL | Status: AC
  Administered 2013-07-24: 18:00:00 7.5 mg via ORAL
  Filled 2013-07-24 (×2): qty 1

## 2013-07-24 MED ORDER — FUROSEMIDE 40 MG PO TABS
40.0000 mg | ORAL_TABLET | Freq: Every day | ORAL | Status: DC
  Administered 2013-07-25: 10:00:00 40 mg via ORAL
  Filled 2013-07-24: qty 1

## 2013-07-24 NOTE — Progress Notes (Signed)
ANTICOAGULATION CONSULT NOTE - Follow Up Consult  Pharmacy Consult:  Heparin + Coumadin Indication: apical LV and RV clot  No Known Allergies  Patient Measurements: Height: 5\' 4"  (162.6 cm) Weight: 173 lb 14.4 oz (78.881 kg) (scale) IBW/kg (Calculated) : 54.7 Heparin dosing weight: 71 kg  Vital Signs: Temp: 98.4 F (36.9 C) (01/14 0410) Temp src: Oral (01/14 0410) BP: 129/79 mmHg (01/14 0410) Pulse Rate: 67 (01/14 0410)  Labs:  Recent Labs  07/22/13 0500 07/23/13 0420 07/23/13 0735 07/24/13 0310  HGB 13.5 14.4  --  13.9  HCT 42.9 43.6  --  42.5  PLT 209 240  --  230  LABPROT 17.8* 16.6* 16.6* 18.9*  INR 1.51* 1.38 1.38 1.63*  HEPARINUNFRC 0.30 0.19* 0.20* 0.28*  CREATININE 2.10* 2.05*  --   --    Estimated Creatinine Clearance: 27.4 ml/min (by C-G formula based on Cr of 2.05).  Assessment: 63 yo female with L & R ventricular clots for anticoagulation  Goal of Therapy:  Heparin level 0.3-0.7 units/ml Monitor platelets by anticoagulation protocol: Yes   Plan:  Increase Heparin 1350 units/hr Coumadin 7.5 mg tonight  07/24/2013, 6:29 AM

## 2013-07-24 NOTE — Progress Notes (Signed)
Subjective:  Feeling better. No chest pain.  Objective:  Vital Signs in the last 24 hours: Temp:  [98 F (36.7 C)-98.5 F (36.9 C)] 98.4 F (36.9 C) (01/14 0410) Pulse Rate:  [65-71] 66 (01/14 1009) Cardiac Rhythm:  [-] Normal sinus rhythm;Bundle branch block (01/14 0805) Resp:  [18-19] 18 (01/14 0410) BP: (128-160)/(74-83) 142/74 mmHg (01/14 1009) SpO2:  [98 %-100 %] 98 % (01/14 0410) Weight:  [68.4 kg (150 lb 12.7 oz)] 68.4 kg (150 lb 12.7 oz) (01/14 0410)  Physical Exam: BP Readings from Last 1 Encounters:  07/24/13 142/74     Wt Readings from Last 1 Encounters:  07/24/13 68.4 kg (150 lb 12.7 oz)    Weight change: -2.134 kg (-4 lb 11.3 oz)  HEENT: [place]/AT, Eyes-Brown, PERL, EOMI, Conjunctiva-Pink, Sclera-Non-icteric Neck: No JVD, No bruit, Trachea midline. Lungs:  Clear, Bilateral. Cardiac:  Regular rhythm, normal S1 and S2, no S3.  Abdomen:  Soft, non-tender. Extremities:  1 + edema present. No cyanosis. No clubbing. CNS: AxOx3, Cranial nerves grossly intact, moves all 4 extremities. Right handed. Skin: Warm and dry.   Intake/Output from previous day: 01/13 0701 - 01/14 0700 In: 400 [P.O.:400] Out: 3200 [Urine:3200]    Lab Results: BMET    Component Value Date/Time   NA 136* 07/23/2013 0420   K 4.4 07/23/2013 0420   CL 96 07/23/2013 0420   CO2 25 07/23/2013 0420   GLUCOSE 106* 07/23/2013 0420   BUN 52* 07/23/2013 0420   CREATININE 2.05* 07/23/2013 0420   CALCIUM 9.1 07/23/2013 0420   GFRNONAA 25* 07/23/2013 0420   GFRAA 29* 07/23/2013 0420   CBC    Component Value Date/Time   WBC 5.2 07/24/2013 0310   RBC 5.57* 07/24/2013 0310   HGB 13.9 07/24/2013 0310   HCT 42.5 07/24/2013 0310   PLT 230 07/24/2013 0310   MCV 76.3* 07/24/2013 0310   MCH 25.0* 07/24/2013 0310   MCHC 32.7 07/24/2013 0310   RDW 15.5 07/24/2013 0310   LYMPHSABS 1.4 12/31/2012 0008   MONOABS 0.6 12/31/2012 0008   EOSABS 0.1 12/31/2012 0008   BASOSABS 0.0 12/31/2012 0008   CARDIAC ENZYMES Lab Results   Component Value Date   CKTOTAL 59 09/07/2012   CKMB 3.2 09/07/2012   TROPONINI 0.36* 07/18/2013    Scheduled Meds: . allopurinol  100 mg Oral BID  . aspirin EC  81 mg Oral Daily  . [START ON 07/25/2013] furosemide  40 mg Oral Daily  . gatifloxacin  1 drop Both Eyes QID  . glipiZIDE  5 mg Oral BID AC  . insulin aspart  0-9 Units Subcutaneous TID WC  . isosorbide-hydrALAZINE  1 tablet Oral TID  . levofloxacin  750 mg Intravenous Q48H  . magnesium oxide  400 mg Oral Daily  . metoprolol tartrate  25 mg Oral BID  . multivitamin with minerals  1 tablet Oral Daily  . pantoprazole  40 mg Oral Daily  . [START ON 07/25/2013] potassium chloride  10 mEq Oral Daily  . simvastatin  10 mg Oral q1800  . sodium chloride  3 mL Intravenous Q12H  . spironolactone  25 mg Oral Daily  . warfarin  7.5 mg Oral ONCE-1800  . Warfarin - Pharmacist Dosing Inpatient   Does not apply q1800   Continuous Infusions: . heparin 1,350 Units/hr (07/24/13 0726)   PRN Meds:.sodium chloride, acetaminophen, nitroGLYCERIN, ondansetron (ZOFRAN) IV, sodium chloride  Assessment/Plan: Acute systolic heart failure  Valvular heart disease  LV and RV apical thrombus  Possible  pneumonia  CAD  S/P Stent in LAD  DM, II  Hypertension  Anxiety  Hypoalbuminemia  CKD, III  Increase activity as tolerated. Home soon.   LOS: 6 days    Tanya CobbAjay Aryianna Earwood  MD  07/24/2013, 10:15 AM

## 2013-07-25 LAB — BASIC METABOLIC PANEL
BUN: 44 mg/dL — ABNORMAL HIGH (ref 6–23)
CO2: 23 meq/L (ref 19–32)
Calcium: 9 mg/dL (ref 8.4–10.5)
Chloride: 97 mEq/L (ref 96–112)
Creatinine, Ser: 1.85 mg/dL — ABNORMAL HIGH (ref 0.50–1.10)
GFR calc Af Amer: 33 mL/min — ABNORMAL LOW (ref >90)
GFR calc non Af Amer: 28 mL/min — ABNORMAL LOW (ref >90)
GLUCOSE: 113 mg/dL — AB (ref 70–99)
Potassium: 4.7 mEq/L (ref 3.7–5.3)
SODIUM: 135 meq/L — AB (ref 137–147)

## 2013-07-25 LAB — CBC
HCT: 43.9 % (ref 36.0–46.0)
Hemoglobin: 14.4 g/dL (ref 12.0–15.0)
MCH: 25.3 pg — ABNORMAL LOW (ref 26.0–34.0)
MCHC: 32.8 g/dL (ref 30.0–36.0)
MCV: 77 fL — ABNORMAL LOW (ref 78.0–100.0)
Platelets: 236 10*3/uL (ref 150–400)
RBC: 5.7 MIL/uL — ABNORMAL HIGH (ref 3.87–5.11)
RDW: 15.6 % — ABNORMAL HIGH (ref 11.5–15.5)
WBC: 5.2 10*3/uL (ref 4.0–10.5)

## 2013-07-25 LAB — PRO B NATRIURETIC PEPTIDE: Pro B Natriuretic peptide (BNP): 6734 pg/mL — ABNORMAL HIGH (ref 0–125)

## 2013-07-25 LAB — GLUCOSE, CAPILLARY
GLUCOSE-CAPILLARY: 117 mg/dL — AB (ref 70–99)
GLUCOSE-CAPILLARY: 169 mg/dL — AB (ref 70–99)
Glucose-Capillary: 134 mg/dL — ABNORMAL HIGH (ref 70–99)

## 2013-07-25 LAB — PROTIME-INR
INR: 1.86 — ABNORMAL HIGH (ref 0.00–1.49)
Prothrombin Time: 20.9 seconds — ABNORMAL HIGH (ref 11.6–15.2)

## 2013-07-25 LAB — HEPARIN LEVEL (UNFRACTIONATED): Heparin Unfractionated: 0.43 IU/mL (ref 0.30–0.70)

## 2013-07-25 MED ORDER — ALLOPURINOL 100 MG PO TABS: 100.0000 mg | ORAL_TABLET | Freq: Two times a day (BID) | ORAL | Status: DC

## 2013-07-25 MED ORDER — ENOXAPARIN SODIUM 80 MG/0.8ML ~~LOC~~ SOLN
80.0000 mg | SUBCUTANEOUS | Status: DC
  Administered 2013-07-25: 15:00:00 80 mg via SUBCUTANEOUS
  Filled 2013-07-25 (×2): qty 0.8

## 2013-07-25 MED ORDER — WARFARIN SODIUM 10 MG PO TABS
10.0000 mg | ORAL_TABLET | Freq: Once | ORAL | Status: AC
  Administered 2013-07-25: 17:00:00 10 mg via ORAL
  Filled 2013-07-25: qty 1

## 2013-07-25 MED ORDER — ENOXAPARIN SODIUM 80 MG/0.8ML ~~LOC~~ SOLN: 80.0000 mg | SUBCUTANEOUS | Status: DC

## 2013-07-25 MED ORDER — POTASSIUM CHLORIDE ER 10 MEQ PO TBCR: 10.0000 meq | EXTENDED_RELEASE_TABLET | Freq: Every day | ORAL | Status: DC

## 2013-07-25 MED ORDER — WARFARIN SODIUM 5 MG PO TABS: 7.5000 mg | ORAL_TABLET | Freq: Every day | ORAL | Status: DC

## 2013-07-25 MED ORDER — LEVOFLOXACIN 250 MG PO TABS: 250.0000 mg | ORAL_TABLET | Freq: Every day | ORAL | Status: DC

## 2013-07-25 MED ORDER — SPIRONOLACTONE 25 MG PO TABS: 25.0000 mg | ORAL_TABLET | Freq: Every day | ORAL | Status: DC

## 2013-07-25 NOTE — Progress Notes (Addendum)
ANTICOAGULATION CONSULT NOTE - Follow Up Consult  Pharmacy Consult:  Heparin + Coumadin Indication: apical LV and RV clot  No Known Allergies  Patient Measurements: Height: 5\' 4"  (162.6 cm) Weight: 173 lb 14.4 oz (78.881 kg) (scale) IBW/kg (Calculated) : 54.7 Heparin dosing weight: 71 kg  Vital Signs: Temp: 98.6 F (37 C) (01/15 0554) Temp src: Oral (01/15 0554) BP: 145/76 mmHg (01/15 0941) Pulse Rate: 66 (01/15 0941)  Labs:  Recent Labs  07/23/13 0420 07/23/13 0735 07/24/13 0310 07/25/13 0535  HGB 14.4  --  13.9 14.4  HCT 43.6  --  42.5 43.9  PLT 240  --  230 236  LABPROT 16.6* 16.6* 18.9* 20.9*  INR 1.38 1.38 1.63* 1.86*  HEPARINUNFRC 0.19* 0.20* 0.28* 0.43  CREATININE 2.05*  --   --  1.85*   Estimated Creatinine Clearance: 30.3 ml/min (by C-G formula based on Cr of 1.85).     Assessment: [age] YOF continues on IV heparin and Coumadin for apical LV and RV clots.  Heparin level therapeutic today and INR trending up toward goal.  No bleeding reported.   Goal of Therapy:  Heparin level 0.3-0.7 units/ml Monitor platelets by anticoagulation protocol: Yes    Plan:  - Continue heparin gtt at 1350 units/hr - Coumadin 10mg  PO today - Daily HL / CBC / PT / INR - Consider stopping Levaquin    Temitope Griffing D. Laney Potashang, PharmD, BCPS Pager:  [phone] 07/25/2013, 10:17 AM    =======================================  Addendum: - transitioning from IV heparin to Lovenox for discharge - MD turned heparin gtt off - borderline renal function     Plan: - At 1500, start Lovenox 80mg  SQ Q24H (dose slightly above 1mg /kg for convenience of dosing) - Give Coumadin prior to discharge - BMET and PT / INR tomorrow at MD's office    Hilari Wethington D. Laney Potashang, PharmD, BCPS Pager:  [phone] 07/25/2013, 1:58 PM

## 2013-07-25 NOTE — Progress Notes (Signed)
UR completed Arcangel Minion K. Gerilyn Stargell, RN, BSN, MSHL, CCM  07/25/2013 2:18 PM

## 2013-07-25 NOTE — Progress Notes (Addendum)
The patient did not have any complaints of pain or acute changes overnight.  She states that she feels ready to go home.

## 2013-07-25 NOTE — Discharge Summary (Signed)
Physician Discharge Summary  Patient ID: [name] MRN: [id number] DOB/AGE: [birth year] 63 y.o.  Admit date: 07/18/2013 Discharge date: 07/25/2013  Admission Diagnoses: Acute systolic heart failure  Valvular heart disease  LV and RV apical thrombus  Possible pneumonia  CAD  S/P Stent in LAD  DM, II  Hypertension  Anxiety  Hypoalbuminemia  CKD, III  Discharge Diagnoses:   Priciple Problem: * Acute systolic heart failure * Valvular heart disease  LV and RV apical thrombus  Possible pneumonia  CAD  S/P Stent in LAD  DM, II  Hypertension  Anxiety  Hypoalbuminemia  CKD, III   Discharged Condition: fair  Hospital Course: 63 year old female with past medical history significant for coronary artery disease status post anteroseptal wall myocardial infarction with occlusion of distal LAD in the recent past treated medically, status post PTCA stenting to proximal LAD in May of 2013, ischemic cardiomyopathy, history of congestive heart failure secondary to systolic dysfunction EF approximately 25-30%, hypertension, diabetes mellitus, hypercholesteremia, COPD, came to the ER complaining of progressive increasing shortness of breath associated with leg swelling for 1-2 weeks. No fever. Denies salt intake. She had good diuresis with lasix use augmented once with zaroxolyn use and twice with IV albumin for hypalbuminemia. She was treated with IV antibiotic for possible pneumonia.  Her echocardiogram showed LV large apical thrombus and RV small apical thrombus. She was treated with IV heparin followed by Lovenox and coumadin with INR over 1.8 at time of discharge. She will be followed by me in 1 day.  Consults: cardiology  Significant Diagnostic Studies: labs: Near normal CBC and electrolytes. Elevated BUN/Cr of 61/1.62. Albumin low at 2.9. Pro-BNP over 18 K down to about 6 K on day of discharge.  Treatments: antibiotics: Levaquin, cardiac meds: metoprolol and furosemide and  anticoagulation: heparin, LMW heparin and warfarin  Chest X-Ray on 07/18/2013 showed: Moderately severe cardiac enlargement with vascular congestion. Focal opacity with tiny left effusion on the left suggests left-sided infiltrate possibly from pneumonia or aspiration. Asymmetric pulmonary edema is considered to be less likely.  Repeat chest x-ray on 07/24/2013 showed: No edema or consolidation. There does remain cardiomegaly with pulmonary venous hypertension suggesting a degree of congestive heart failure persisting. No new opacity.  2 D echocardiogram showed: Left ventricle: The cavity size was mildly dilated. There was mild concentric hypertrophy. Systolic function was severely reduced. The estimated ejection fraction was in the range of 25% to 30%. There is akinesis of the apical myocardium. There is moderate hypokinesis of the entire myocardium. Doppler parameters are consistent with abnormal left ventricular relaxation (grade 1 diastolic dysfunction). There was a medium-sized, 16mm (L) x 22 mm (W), fixed, apical thrombus. - Aortic valve: Mild regurgitation. - Mitral valve: Moderate regurgitation. - Left atrium: The atrium was moderately dilated. - Right ventricle: The cavity size was mildly dilated. Wall thickness was normal. Systolic function was severely reduced. There was a small, fixed thrombus at the apex. - Right atrium: The atrium was mildly dilated. - Tricuspid valve: Moderate regurgitation. - Pulmonary arteries: Systolic pressure was moderately increased. PA peak pressure: 54mm Hg (S).  EKG-Sinus tachycardia, bi-atrial enlargement, right axis deviation.  Discharge Exam: Blood pressure 120/70, pulse 69, temperature 98.3 F (36.8 C), temperature source Oral, resp. rate 18, height 5\' 4"  (1.626 m), weight 68.2 kg (150 lb 5.7 oz), SpO2 100.00%. HEENT: [place]/AT, Eyes-Brown, PERL, EOMI, Conjunctiva-Pink, Sclera-Non-icteric  Neck: No JVD, No bruit, Trachea midline.  Lungs: Clear,  Bilateral.  Cardiac: Regular rhythm, normal S1 and S2,  no S3.  Abdomen: Soft, non-tender.  Extremities: Trace to 1 + edema present. No cyanosis. No clubbing.  CNS: AxOx3, Cranial nerves grossly intact, moves all 4 extremities. Right handed.  Skin: Warm and dry.  Disposition: 01-Home or Self Care     Medication List    STOP taking these medications       ramipril 5 MG capsule  Commonly known as:  ALTACE      TAKE these medications       allopurinol 100 MG tablet  Commonly known as:  ZYLOPRIM  Take 1 tablet (100 mg total) by mouth 2 (two) times daily.     amLODipine 5 MG tablet  Commonly known as:  NORVASC  Take 1 tablet (5 mg total) by mouth daily.     aspirin EC 81 MG tablet  Take 81 mg by mouth daily.     cloNIDine 0.2 MG tablet  Commonly known as:  CATAPRES  Take 1 tablet (0.2 mg total) by mouth at bedtime.     enoxaparin 80 MG/0.8ML injection  Commonly known as:  LOVENOX  Inject 0.8 mLs (80 mg total) into the skin daily.     furosemide 40 MG tablet  Commonly known as:  LASIX  Take 2 tablets (80 mg total) by mouth daily.     gatifloxacin 0.3 % ophthalmic drops  Commonly known as:  ZYMAR  Place 1 drop into both eyes 4 (four) times daily.     glipiZIDE 5 MG tablet  Commonly known as:  GLUCOTROL  Take 1 tablet (5 mg total) by mouth 2 (two) times daily before a meal.     isosorbide-hydrALAZINE 20-37.5 MG per tablet  Commonly known as:  BIDIL  Take 1 tablet by mouth 3 (three) times daily.     levofloxacin 250 MG tablet  Commonly known as:  LEVAQUIN  Take 1 tablet (250 mg total) by mouth daily.     metoprolol tartrate 25 MG tablet  Commonly known as:  LOPRESSOR  Take 1 tablet (25 mg total) by mouth 2 (two) times daily.     multivitamin with minerals Tabs tablet  Take 1 tablet by mouth daily.     nitroGLYCERIN 0.4 MG SL tablet  Commonly known as:  NITROSTAT  Place 1 tablet (0.4 mg total) under the tongue every 5 (five) minutes as needed for chest pain.      OMEGA 3 PO  Take 1 capsule by mouth daily.     omeprazole 20 MG capsule  Commonly known as:  PRILOSEC  Take 20 mg by mouth daily.     potassium chloride 10 MEQ tablet  Commonly known as:  K-DUR  Take 1 tablet (10 mEq total) by mouth daily. 11/21/11 last dose.     pravastatin 40 MG tablet  Commonly known as:  PRAVACHOL  Take 1 tablet (40 mg total) by mouth at bedtime.     spironolactone 25 MG tablet  Commonly known as:  ALDACTONE  Take 1 tablet (25 mg total) by mouth daily.     warfarin 5 MG tablet  Commonly known as:  COUMADIN  Take 1.5 tablets (7.5 mg total) by mouth daily at 6 PM.           Follow-up Information   Follow up with [facility], MD. Schedule an appointment as soon as possible for a visit in 1 day. (INR check-10 AM)    Specialty:  Cardiology   Contact information:   [address] [place] [address]  [phone]       Signed: [name] 07/25/2013, 5:06 PM

## 2014-02-03 ENCOUNTER — Inpatient Hospital Stay (HOSPITAL_COMMUNITY)
Admission: AD | Admit: 2014-02-03 | Discharge: 2014-02-05 | DRG: 291 | Disposition: A | Discharge status: Home / Self Care | Payer: No Typology Code available for payment source | Source: Ambulatory Visit | Attending: Cardiovascular Disease | Admitting: Cardiovascular Disease

## 2014-02-03 ENCOUNTER — Encounter (HOSPITAL_COMMUNITY): Payer: Self-pay | Admitting: *Deleted

## 2014-02-03 DIAGNOSIS — E7800 Pure hypercholesterolemia, unspecified: Secondary | ICD-10-CM | POA: Diagnosis present

## 2014-02-03 DIAGNOSIS — I2510 Atherosclerotic heart disease of native coronary artery without angina pectoris: Secondary | ICD-10-CM | POA: Diagnosis present

## 2014-02-03 DIAGNOSIS — Z7901 Long term (current) use of anticoagulants: Secondary | ICD-10-CM

## 2014-02-03 DIAGNOSIS — Z7982 Long term (current) use of aspirin: Secondary | ICD-10-CM

## 2014-02-03 DIAGNOSIS — J4489 Other specified chronic obstructive pulmonary disease: Secondary | ICD-10-CM | POA: Diagnosis present

## 2014-02-03 DIAGNOSIS — F411 Generalized anxiety disorder: Secondary | ICD-10-CM | POA: Diagnosis present

## 2014-02-03 DIAGNOSIS — I509 Heart failure, unspecified: Secondary | ICD-10-CM | POA: Diagnosis present

## 2014-02-03 DIAGNOSIS — N289 Disorder of kidney and ureter, unspecified: Secondary | ICD-10-CM | POA: Diagnosis present

## 2014-02-03 DIAGNOSIS — I5041 Acute combined systolic (congestive) and diastolic (congestive) heart failure: Secondary | ICD-10-CM

## 2014-02-03 DIAGNOSIS — I2589 Other forms of chronic ischemic heart disease: Secondary | ICD-10-CM | POA: Diagnosis present

## 2014-02-03 DIAGNOSIS — I252 Old myocardial infarction: Secondary | ICD-10-CM

## 2014-02-03 DIAGNOSIS — J189 Pneumonia, unspecified organism: Secondary | ICD-10-CM | POA: Diagnosis present

## 2014-02-03 DIAGNOSIS — J449 Chronic obstructive pulmonary disease, unspecified: Secondary | ICD-10-CM | POA: Diagnosis present

## 2014-02-03 DIAGNOSIS — Z9861 Coronary angioplasty status: Secondary | ICD-10-CM

## 2014-02-03 DIAGNOSIS — E860 Dehydration: Secondary | ICD-10-CM | POA: Diagnosis present

## 2014-02-03 DIAGNOSIS — I10 Essential (primary) hypertension: Secondary | ICD-10-CM | POA: Diagnosis present

## 2014-02-03 DIAGNOSIS — E119 Type 2 diabetes mellitus without complications: Secondary | ICD-10-CM | POA: Diagnosis present

## 2014-02-03 DIAGNOSIS — I2789 Other specified pulmonary heart diseases: Secondary | ICD-10-CM | POA: Diagnosis present

## 2014-02-03 DIAGNOSIS — I5021 Acute systolic (congestive) heart failure: Principal | ICD-10-CM | POA: Diagnosis present

## 2014-02-03 LAB — COMPREHENSIVE METABOLIC PANEL
ALT: 12 U/L (ref 0–35)
ANION GAP: 17 — AB (ref 5–15)
AST: 21 U/L (ref 0–37)
Albumin: 3.1 g/dL — ABNORMAL LOW (ref 3.5–5.2)
Alkaline Phosphatase: 88 U/L (ref 39–117)
BILIRUBIN TOTAL: 1.1 mg/dL (ref 0.3–1.2)
BUN: 23 mg/dL (ref 6–23)
CALCIUM: 8.7 mg/dL (ref 8.4–10.5)
CHLORIDE: 103 meq/L (ref 96–112)
CO2: 20 meq/L (ref 19–32)
Creatinine, Ser: 0.99 mg/dL (ref 0.50–1.10)
GFR, EST AFRICAN AMERICAN: 70 mL/min — AB (ref >90)
GFR, EST NON AFRICAN AMERICAN: 60 mL/min — AB (ref >90)
GLUCOSE: 77 mg/dL (ref 70–99)
Potassium: 3.4 mEq/L — ABNORMAL LOW (ref 3.7–5.3)
Sodium: 140 mEq/L (ref 137–147)
Total Protein: 6.9 g/dL (ref 6.0–8.3)

## 2014-02-03 LAB — PRO B NATRIURETIC PEPTIDE: PRO B NATRI PEPTIDE: 10231 pg/mL — AB (ref 0–125)

## 2014-02-03 LAB — CBC WITH DIFFERENTIAL/PLATELET
Basophils Absolute: 0 10*3/uL (ref 0.0–0.1)
Basophils Relative: 0 % (ref 0–1)
Eosinophils Absolute: 0 10*3/uL (ref 0.0–0.7)
Eosinophils Relative: 0 % (ref 0–5)
HEMATOCRIT: 41.3 % (ref 36.0–46.0)
Hemoglobin: 13.1 g/dL (ref 12.0–15.0)
LYMPHS PCT: 16 % (ref 12–46)
Lymphs Abs: 0.9 10*3/uL (ref 0.7–4.0)
MCH: 25.2 pg — ABNORMAL LOW (ref 26.0–34.0)
MCHC: 31.7 g/dL (ref 30.0–36.0)
MCV: 79.6 fL (ref 78.0–100.0)
MONO ABS: 0.3 10*3/uL (ref 0.1–1.0)
MONOS PCT: 6 % (ref 3–12)
NEUTROS ABS: 4.6 10*3/uL (ref 1.7–7.7)
Neutrophils Relative %: 78 % — ABNORMAL HIGH (ref 43–77)
Platelets: 241 10*3/uL (ref 150–400)
RBC: 5.19 MIL/uL — ABNORMAL HIGH (ref 3.87–5.11)
RDW: 15.1 % (ref 11.5–15.5)
WBC: 5.9 10*3/uL (ref 4.0–10.5)

## 2014-02-03 LAB — TROPONIN I
Troponin I: <0.3 ng/mL (ref <0.30)
Troponin I: <0.3 ng/mL (ref <0.30)

## 2014-02-03 LAB — TSH: TSH: 1.99 u[IU]/mL (ref 0.350–4.500)

## 2014-02-03 LAB — HEMOGLOBIN A1C
Hgb A1c MFr Bld: 7.1 % — ABNORMAL HIGH (ref <5.7)
Mean Plasma Glucose: 157 mg/dL — ABNORMAL HIGH (ref <117)

## 2014-02-03 LAB — GLUCOSE, CAPILLARY
Glucose-Capillary: 83 mg/dL (ref 70–99)
Glucose-Capillary: 148 mg/dL — ABNORMAL HIGH (ref 70–99)

## 2014-02-03 LAB — PROTIME-INR
INR: 1.24 (ref 0.00–1.49)
Prothrombin Time: 15.6 seconds — ABNORMAL HIGH (ref 11.6–15.2)

## 2014-02-03 MED ORDER — SODIUM CHLORIDE 0.9 % IV SOLN
250.0000 mL | INTRAVENOUS | Status: DC | PRN
Start: 2014-02-03 — End: 2014-02-05

## 2014-02-03 MED ORDER — WARFARIN SODIUM 2.5 MG PO TABS
12.5000 mg | ORAL_TABLET | Freq: Once | ORAL | Status: DC
  Filled 2014-02-03: qty 1

## 2014-02-03 MED ORDER — INSULIN ASPART 100 UNIT/ML ~~LOC~~ SOLN
0.0000 [IU] | Freq: Three times a day (TID) | SUBCUTANEOUS | Status: DC
  Administered 2014-02-03 – 2014-02-04 (×2): 2 [IU] via SUBCUTANEOUS

## 2014-02-03 MED ORDER — GLIPIZIDE 5 MG PO TABS
5.0000 mg | ORAL_TABLET | Freq: Two times a day (BID) | ORAL | Status: DC
  Administered 2014-02-03 – 2014-02-05 (×4): 5 mg via ORAL
  Filled 2014-02-03 (×6): qty 1

## 2014-02-03 MED ORDER — POTASSIUM CHLORIDE CRYS ER 20 MEQ PO TBCR
20.0000 meq | EXTENDED_RELEASE_TABLET | Freq: Two times a day (BID) | ORAL | Status: DC
  Administered 2014-02-03: 20 meq via ORAL
  Filled 2014-02-03: qty 1

## 2014-02-03 MED ORDER — WARFARIN SODIUM 7.5 MG PO TABS
7.5000 mg | ORAL_TABLET | Freq: Every day | ORAL | Status: DC
Start: 2014-02-03 — End: 2014-02-03

## 2014-02-03 MED ORDER — FUROSEMIDE 10 MG/ML IJ SOLN
80.0000 mg | Freq: Two times a day (BID) | INTRAMUSCULAR | Status: DC
  Administered 2014-02-03 – 2014-02-04 (×4): 80 mg via INTRAVENOUS
  Filled 2014-02-03 (×6): qty 8

## 2014-02-03 MED ORDER — ACETAMINOPHEN 325 MG PO TABS: 650.0000 mg | ORAL_TABLET | ORAL | Status: DC | PRN

## 2014-02-03 MED ORDER — CLONIDINE HCL 0.2 MG PO TABS
0.2000 mg | ORAL_TABLET | Freq: Every day | ORAL | Status: DC
  Administered 2014-02-03: 0.2 mg via ORAL
  Filled 2014-02-03 (×3): qty 1

## 2014-02-03 MED ORDER — PANTOPRAZOLE SODIUM 40 MG PO TBEC
40.0000 mg | DELAYED_RELEASE_TABLET | Freq: Every day | ORAL | Status: DC
  Administered 2014-02-03 – 2014-02-04 (×2): 40 mg via ORAL
  Filled 2014-02-03 (×2): qty 1

## 2014-02-03 MED ORDER — HEPARIN SODIUM (PORCINE) 5000 UNIT/ML IJ SOLN: 5000.0000 [IU] | Freq: Three times a day (TID) | INTRAMUSCULAR | Status: DC

## 2014-02-03 MED ORDER — ASPIRIN EC 81 MG PO TBEC
81.0000 mg | DELAYED_RELEASE_TABLET | Freq: Every day | ORAL | Status: DC
  Administered 2014-02-03 – 2014-02-05 (×3): 81 mg via ORAL
  Filled 2014-02-03 (×3): qty 1

## 2014-02-03 MED ORDER — ALLOPURINOL 100 MG PO TABS
100.0000 mg | ORAL_TABLET | Freq: Two times a day (BID) | ORAL | Status: DC | PRN
  Filled 2014-02-03: qty 1

## 2014-02-03 MED ORDER — ONDANSETRON HCL 4 MG/2ML IJ SOLN: 4.0000 mg | Freq: Four times a day (QID) | INTRAMUSCULAR | Status: DC | PRN

## 2014-02-03 MED ORDER — ISOSORB DINITRATE-HYDRALAZINE 20-37.5 MG PO TABS
1.0000 | ORAL_TABLET | Freq: Three times a day (TID) | ORAL | Status: DC
  Administered 2014-02-03 – 2014-02-05 (×6): 1 via ORAL
  Filled 2014-02-03 (×8): qty 1

## 2014-02-03 MED ORDER — SODIUM CHLORIDE 0.9 % IJ SOLN
3.0000 mL | Freq: Two times a day (BID) | INTRAMUSCULAR | Status: DC
  Administered 2014-02-03 – 2014-02-04 (×4): 3 mL via INTRAVENOUS

## 2014-02-03 MED ORDER — ALPRAZOLAM 0.25 MG PO TABS: 0.2500 mg | ORAL_TABLET | Freq: Two times a day (BID) | ORAL | Status: DC | PRN

## 2014-02-03 MED ORDER — HEPARIN SODIUM (PORCINE) 5000 UNIT/ML IJ SOLN
5000.0000 [IU] | Freq: Three times a day (TID) | INTRAMUSCULAR | Status: DC
  Administered 2014-02-03 – 2014-02-05 (×6): 5000 [IU] via SUBCUTANEOUS
  Filled 2014-02-03 (×9): qty 1

## 2014-02-03 MED ORDER — ALLOPURINOL 100 MG PO TABS
100.0000 mg | ORAL_TABLET | Freq: Two times a day (BID) | ORAL | Status: DC
  Filled 2014-02-03: qty 1

## 2014-02-03 MED ORDER — POTASSIUM CHLORIDE CRYS ER 20 MEQ PO TBCR
20.0000 meq | EXTENDED_RELEASE_TABLET | Freq: Three times a day (TID) | ORAL | Status: DC
  Administered 2014-02-03 (×2): 20 meq via ORAL
  Filled 2014-02-03 (×4): qty 1

## 2014-02-03 MED ORDER — SODIUM CHLORIDE 0.9 % IJ SOLN
3.0000 mL | INTRAMUSCULAR | Status: DC | PRN
Start: 2014-02-03 — End: 2014-02-05

## 2014-02-03 MED ORDER — AMLODIPINE BESYLATE 5 MG PO TABS
5.0000 mg | ORAL_TABLET | Freq: Every day | ORAL | Status: DC
  Administered 2014-02-04 – 2014-02-05 (×2): 5 mg via ORAL
  Filled 2014-02-03 (×2): qty 1

## 2014-02-03 MED ORDER — CARVEDILOL 3.125 MG PO TABS
3.1250 mg | ORAL_TABLET | Freq: Two times a day (BID) | ORAL | Status: DC
Start: 2014-02-03 — End: 2014-02-05
  Administered 2014-02-03 – 2014-02-05 (×4): 3.125 mg via ORAL
  Filled 2014-02-03 (×6): qty 1

## 2014-02-03 MED ORDER — WARFARIN - PHARMACIST DOSING INPATIENT: Freq: Every day | Status: DC

## 2014-02-03 MED ORDER — NITROGLYCERIN 0.4 MG SL SUBL: 0.4000 mg | SUBLINGUAL_TABLET | SUBLINGUAL | Status: DC | PRN

## 2014-02-03 MED ORDER — SPIRONOLACTONE 25 MG PO TABS
25.0000 mg | ORAL_TABLET | Freq: Every day | ORAL | Status: DC
  Administered 2014-02-03 – 2014-02-04 (×2): 25 mg via ORAL
  Filled 2014-02-03 (×3): qty 1

## 2014-02-03 NOTE — Progress Notes (Addendum)
ANTICOAGULATION CONSULT NOTE - Initial Consult  Pharmacy Consult for Coumadin Indication: ??  No Known Allergies  Patient Measurements: Height: 5\' 5"  (165.1 cm) Weight: 164 lb 11.2 oz (74.707 kg) IBW/kg (Calculated) : 57 Heparin Dosing Weight:    Vital Signs: Temp: 97.8 F (36.6 C) (07/27 1146) Temp src: Oral (07/27 1146) BP: 155/86 mmHg (07/27 1146) Pulse Rate: 99 (07/27 1146)  Labs:  Recent Labs  02/03/14 1239  HGB 13.1  HCT 41.3  PLT 241  LABPROT 15.6*  INR 1.24  CREATININE 0.99  TROPONINI <0.30    Estimated Creatinine Clearance: 60.4 ml/min (by C-G formula based on Cr of 0.99).   Medical History: Past Medical History  Diagnosis Date  . Hypertension   . Diabetes mellitus   . Heart failure   . Coronary artery disease   . Stented coronary artery   . CHF (congestive heart failure)     Medications:  Prescriptions prior to admission  Medication Sig Dispense Refill  . allopurinol (ZYLOPRIM) 100 MG tablet Take 100 mg by mouth 2 (two) times daily as needed (for gout).       Marland Kitchen amLODipine (NORVASC) 5 MG tablet Take 5 mg by mouth 2 (two) times daily.      . cloNIDine (CATAPRES) 0.2 MG tablet Take 0.2 mg by mouth at bedtime.      . furosemide (LASIX) 40 MG tablet Take 80 mg by mouth 2 (two) times daily.      Marland Kitchen glipiZIDE (GLUCOTROL) 5 MG tablet Take 5 mg by mouth 2 (two) times daily before a meal.      . isosorbide-hydrALAZINE (BIDIL) 20-37.5 MG per tablet Take 1 tablet by mouth 3 (three) times daily.      . Multiple Vitamin (MULTIVITAMIN WITH MINERALS) TABS Take 1 tablet by mouth daily.      . nitroGLYCERIN (NITROSTAT) 0.4 MG SL tablet Place 0.4 mg under the tongue every 5 (five) minutes as needed for chest pain.      . Omega-3 Fatty Acids (OMEGA 3 PO) Take 1 capsule by mouth daily.      Marland Kitchen omeprazole (PRILOSEC) 20 MG capsule Take 20 mg by mouth daily.      . potassium chloride (K-DUR) 10 MEQ tablet Take 10 mEq by mouth daily as needed (when she starts to feel  "sluggish"). 11/21/11 last dose.      . spironolactone (ALDACTONE) 25 MG tablet Take 25 mg by mouth at bedtime.      Marland Kitchen warfarin (COUMADIN) 5 MG tablet Take 7.5 mg by mouth daily at 6 PM.      . [DISCONTINUED] allopurinol (ZYLOPRIM) 100 MG tablet Take 1 tablet (100 mg total) by mouth 2 (two) times daily.  60 tablet  1  . [DISCONTINUED] amLODipine (NORVASC) 5 MG tablet Take 1 tablet (5 mg total) by mouth daily.      . [DISCONTINUED] cloNIDine (CATAPRES) 0.2 MG tablet Take 1 tablet (0.2 mg total) by mouth at bedtime.  30 tablet    . [DISCONTINUED] furosemide (LASIX) 40 MG tablet Take 2 tablets (80 mg total) by mouth daily.  120 tablet  1  . [DISCONTINUED] glipiZIDE (GLUCOTROL) 5 MG tablet Take 1 tablet (5 mg total) by mouth 2 (two) times daily before a meal.  60 tablet  1  . [DISCONTINUED] isosorbide-hydrALAZINE (BIDIL) 20-37.5 MG per tablet Take 1 tablet by mouth 3 (three) times daily.  90 tablet  1  . [DISCONTINUED] nitroGLYCERIN (NITROSTAT) 0.4 MG SL tablet Place 1 tablet (0.4 mg total)  under the tongue every 5 (five) minutes as needed for chest pain.  25 tablet  1  . [DISCONTINUED] potassium chloride (K-DUR) 10 MEQ tablet Take 1 tablet (10 mEq total) by mouth daily. 11/21/11 last dose.  60 tablet  1  . [DISCONTINUED] spironolactone (ALDACTONE) 25 MG tablet Take 1 tablet (25 mg total) by mouth daily.  30 tablet  1  . [DISCONTINUED] warfarin (COUMADIN) 5 MG tablet Take 1.5 tablets (7.5 mg total) by mouth daily at 6 PM.  45 tablet  1    Assessment: Admitted from MD office with leg edema, SOB 63 y/o F admitted to hospital for LE edema and SOB from Dr. Roseanne KaufmanKadakia's office. She has PMH significant for CAD with MI, ICM, CHF, EF 25-30%, HTN, DM, HLD, COPD. Upon completing her med history, I am highly suspicious about her medication compliance.  Labs: proBP 10,231, CBC WNL, K=3.4. CrCl 60  Anticoagulation: Admit INR 1.24. Not entirely sure of her Coumadin indication and patient has no idea. Did speak with Dr.  Algie CofferKadakia about subtherapeutic INR and patient says she has not been taking the Coumadin (during my first visit she said she was taking it). Dr. Algie CofferKadakia ok to leave it off and will discuss with patient.  Goal of Therapy:  INR 2-3 Monitor platelets by anticoagulation protocol: Yes   Plan:  Hold Coumadin for now SQ heparin for DVT prophx.  Jailyn Langhorst S. Merilynn Finlandobertson, PharmD, BCPS Clinical Staff Pharmacist Pager [phone]  Misty Stanleyobertson, Swayzie Choate Stillinger 02/03/2014,1:37 PM

## 2014-02-03 NOTE — H&P (Signed)
Referring Physician:  ELINORA WEIGAND is an 63 y.o. female.                       Chief Complaint: Leg edema  HPI: 63 year old female withpast medical history significant for coronary artery disease status post anteroseptal wall myocardial infarction with occlusion of distal LAD in the recent past treated medically, status post PTCA stenting to proximal LAD in May of 2013, ischemic cardiomyopathy, history of congestive heart failure secondary to systolic dysfunction EF approximately 25-30%, hypertension, diabetes mellitus, hypercholesteremia, COPD, came to the office with leg edema and little shortness of breath for 2 weeks. No fever or cough.   Past Medical History  Diagnosis Date  . Hypertension   . Diabetes mellitus   . Heart failure   . Coronary artery disease   . Stented coronary artery   . CHF (congestive heart failure)       No past surgical history on file.  No family history on file. Social History:  reports that she has never smoked. She does not have any smokeless tobacco history on file. She reports that she does not drink alcohol or use illicit drugs.  Allergies: No Known Allergies  Medications Prior to Admission  Medication Sig Dispense Refill  . allopurinol (ZYLOPRIM) 100 MG tablet Take 1 tablet (100 mg total) by mouth 2 (two) times daily.  60 tablet  1  . amLODipine (NORVASC) 5 MG tablet Take 1 tablet (5 mg total) by mouth daily.      Marland Kitchen aspirin EC 81 MG tablet Take 81 mg by mouth daily.      . cloNIDine (CATAPRES) 0.2 MG tablet Take 1 tablet (0.2 mg total) by mouth at bedtime.  30 tablet    . enoxaparin (LOVENOX) 80 MG/0.8ML injection Inject 0.8 mLs (80 mg total) into the skin daily.  10 Syringe  0  . furosemide (LASIX) 40 MG tablet Take 2 tablets (80 mg total) by mouth daily.  120 tablet  1  . gatifloxacin (ZYMAR) 0.3 % ophthalmic drops Place 1 drop into both eyes 4 (four) times daily.      Marland Kitchen glipiZIDE (GLUCOTROL) 5 MG tablet Take 1 tablet (5 mg total) by mouth 2  (two) times daily before a meal.  60 tablet  1  . isosorbide-hydrALAZINE (BIDIL) 20-37.5 MG per tablet Take 1 tablet by mouth 3 (three) times daily.  90 tablet  1  . levofloxacin (LEVAQUIN) 250 MG tablet Take 1 tablet (250 mg total) by mouth daily.  5 tablet  0  . metoprolol tartrate (LOPRESSOR) 25 MG tablet Take 1 tablet (25 mg total) by mouth 2 (two) times daily.  60 tablet  1  . Multiple Vitamin (MULTIVITAMIN WITH MINERALS) TABS Take 1 tablet by mouth daily.      . nitroGLYCERIN (NITROSTAT) 0.4 MG SL tablet Place 1 tablet (0.4 mg total) under the tongue every 5 (five) minutes as needed for chest pain.  25 tablet  1  . Omega-3 Fatty Acids (OMEGA 3 PO) Take 1 capsule by mouth daily.      Marland Kitchen omeprazole (PRILOSEC) 20 MG capsule Take 20 mg by mouth daily.      . potassium chloride (K-DUR) 10 MEQ tablet Take 1 tablet (10 mEq total) by mouth daily. 11/21/11 last dose.  60 tablet  1  . pravastatin (PRAVACHOL) 40 MG tablet Take 1 tablet (40 mg total) by mouth at bedtime.  30 tablet  1  . spironolactone (ALDACTONE)  25 MG tablet Take 1 tablet (25 mg total) by mouth daily.  30 tablet  1  . warfarin (COUMADIN) 5 MG tablet Take 1.5 tablets (7.5 mg total) by mouth daily at 6 PM.  45 tablet  1    No results found for this or any previous visit (from the past 48 hour(s)). No results found.  Review Of Systems + wears glasses, + weight gain + partial dentures, + COPD, + angina, + leg edema, + hypertension, + DM, II, + CHF, No CVA, No seizures, No kidney stone, + arthritis.   Blood pressure 155/86, pulse 99, temperature 97.8 F (36.6 C), temperature source Oral, resp. rate 18, height 5\' 5"  (1.651 m), weight 74.707 kg (164 lb 11.2 oz), SpO2 95.00%.  Physical Exam:  HEENT: [place]/AT, Ave built and nourished. Intel CorporationBrown eyes, PERL, EOMI.  Neck: + JVD, no bruit, no thyromegaly.  Lungs: Few, bilateral basal crackles.  Heart: Normal S1 and S2. Grade III/VI systolic murmur LSB  Abdomen: Mild swelling, non-tender.  Ext: 2  + edema up to knees, bilaterally.  CNS: Cranial nerves grossly intact.  Skin: Warm and dry.  Assessment/Plan Acute systolic heart failure  Possible pneumonia  CAD  S/P Stent in LAD  DM, II  Hypertension  Anxiety  IV lasix/Echo/Home medications/R/O MI  Mayo Clinic Health System-Oakridge IncKADAKIA,[place]earah Gayden S, MD  02/03/2014, 12:15 PM

## 2014-02-03 NOTE — Progress Notes (Signed)
Hypoglycemic Event  CBG: 57  Treatment: 15 GM carbohydrate snack  Symptoms: Sweaty  Follow-up CBG: Time:2120 CBG Result: 104  Possible Reasons for Event: Unknown  Donnamae JudeMoore, Rik Wadel Nicole  Remember to initiate Hypoglycemia Order Set & complete

## 2014-02-03 NOTE — Plan of Care (Signed)
Problem: Phase I Progression Outcomes Goal: EF % per last Echo/documented,Core Reminder form on chart Outcome: Completed/Met Date Met:  02/03/14 EF 25-30% from ECHO done 2015.

## 2014-02-03 NOTE — Care Management Note (Addendum)
  Page 1 of 1   02/05/2014     1:30:52 PM CARE MANAGEMENT NOTE 02/05/2014  Patient:  [name],[name]   Account Number:  [id number]  Date Initiated:  02/03/2014  Documentation initiated by:  Devynn Hessler  Subjective/Objective Assessment:   CHF     Action/Plan:   CM to follow for dispositon needs   Anticipated DC Date:  02/05/2014   Anticipated DC Plan:  HOME/SELF CARE      DC Planning Services  CM consult      Choice offered to / List presented to:             Status of service:  Completed, signed off Medicare Important Message given?   (If response is "NO", the following Medicare IM given date fields will be blank) Date Medicare IM given:   Medicare IM given by:   Date Additional Medicare IM given:   Additional Medicare IM given by:    Discharge Disposition:  HOME/SELF CARE  Per UR Regulation:  Reviewed for med. necessity/level of care/duration of stay  If discussed at Long Length of Stay Meetings, dates discussed:    Comments:  Cledith Abdou RN, BSN, MSHL, CCM  Nurse - Case Manager,  ([facility])  [phone]  02/05/2014 63 yo with comercial Coverntry plan; IM n/[name] CHF and medication modification mgmt Social:  From home with husband Jons,[name]  [phone] Dispositon Plan:  Home / selfcare.

## 2014-02-03 NOTE — Progress Notes (Signed)
  Echocardiogram 2D Echocardiogram has been performed.  Georgian CoWILLIAMS, Adalei Novell 02/03/2014, 4:22 PM

## 2014-02-04 ENCOUNTER — Observation Stay (HOSPITAL_COMMUNITY): Payer: No Typology Code available for payment source

## 2014-02-04 LAB — GLUCOSE, CAPILLARY
GLUCOSE-CAPILLARY: 104 mg/dL — AB (ref 70–99)
GLUCOSE-CAPILLARY: 57 mg/dL — AB (ref 70–99)
GLUCOSE-CAPILLARY: 78 mg/dL (ref 70–99)
Glucose-Capillary: 101 mg/dL — ABNORMAL HIGH (ref 70–99)
Glucose-Capillary: 139 mg/dL — ABNORMAL HIGH (ref 70–99)
Glucose-Capillary: 103 mg/dL — ABNORMAL HIGH (ref 70–99)

## 2014-02-04 LAB — BASIC METABOLIC PANEL
ANION GAP: 13 (ref 5–15)
BUN: 27 mg/dL — ABNORMAL HIGH (ref 6–23)
CO2: 21 mEq/L (ref 19–32)
Calcium: 8.4 mg/dL (ref 8.4–10.5)
Chloride: 105 mEq/L (ref 96–112)
Creatinine, Ser: 1.45 mg/dL — ABNORMAL HIGH (ref 0.50–1.10)
GFR calc Af Amer: 44 mL/min — ABNORMAL LOW (ref >90)
GFR, EST NON AFRICAN AMERICAN: 38 mL/min — AB (ref >90)
GLUCOSE: 71 mg/dL (ref 70–99)
POTASSIUM: 4.2 meq/L (ref 3.7–5.3)
SODIUM: 139 meq/L (ref 137–147)

## 2014-02-04 LAB — PROTIME-INR
INR: 1.25 (ref 0.00–1.49)
Prothrombin Time: 15.7 seconds — ABNORMAL HIGH (ref 11.6–15.2)

## 2014-02-04 LAB — TROPONIN I

## 2014-02-04 MED ORDER — ALBUMIN HUMAN 25 % IV SOLN
12.5000 g | Freq: Once | INTRAVENOUS | Status: AC
Start: 2014-02-04 — End: 2014-02-04
  Administered 2014-02-04: 12.5 g via INTRAVENOUS
  Filled 2014-02-04: qty 50

## 2014-02-04 MED ORDER — POTASSIUM CHLORIDE CRYS ER 10 MEQ PO TBCR
10.0000 meq | EXTENDED_RELEASE_TABLET | Freq: Every day | ORAL | Status: DC
  Administered 2014-02-04 – 2014-02-05 (×2): 10 meq via ORAL
  Filled 2014-02-04 (×2): qty 1

## 2014-02-04 NOTE — Progress Notes (Signed)
Ref: Tanya Rodriguez, MD   Subjective:  Feeling better. Blood pressure lower. EF 20 % by echocardiogram. Severe MR and TR and mild AI.  Objective:  Vital Signs in the last 24 hours: Temp:  [97.8 F (36.6 C)-98 F (36.7 C)] 97.8 F (36.6 C) (07/28 0530) Pulse Rate:  [79-99] 79 (07/28 0530) Cardiac Rhythm:  [-] Normal sinus rhythm (07/27 2036) Resp:  [18-22] 20 (07/28 0530) BP: (107-155)/(71-86) 115/75 mmHg (07/28 0530) SpO2:  [95 %-100 %] 100 % (07/28 0530) Weight:  [73.936 kg (163 lb)-74.707 kg (164 lb 11.2 oz)] 73.936 kg (163 lb) (07/28 0530)  Physical Exam: BP Readings from Last 1 Encounters:  02/04/14 115/75    Wt Readings from Last 1 Encounters:  02/04/14 73.936 kg (163 lb)    Weight change:   HEENT: [place]/AT, Eyes-Brown, PERL, EOMI, Conjunctiva-Pink, Sclera-Non-icteric Neck: + JVD, No bruit, Trachea midline. Lungs:  Clear, Bilateral. Cardiac:  Regular rhythm, normal S1 and S2, no S3.  Abdomen:  Soft, non-tender. Extremities:  1 + edema present. No cyanosis. No clubbing. CNS: AxOx3, Cranial nerves grossly intact, moves all 4 extremities. Right handed. Skin: Warm and dry.   Intake/Output from previous day: 07/27 0701 - 07/28 0700 In: 600 [P.O.:600] Out: 1875 [Urine:1875]    Lab Results: BMET    Component Value Date/Time   NA 139 02/04/2014 0045   NA 140 02/03/2014 1239   NA 135* 07/25/2013 0535   K 4.2 02/04/2014 0045   K 3.4* 02/03/2014 1239   K 4.7 07/25/2013 0535   CL 105 02/04/2014 0045   CL 103 02/03/2014 1239   CL 97 07/25/2013 0535   CO2 21 02/04/2014 0045   CO2 20 02/03/2014 1239   CO2 23 07/25/2013 0535   GLUCOSE 71 02/04/2014 0045   GLUCOSE 77 02/03/2014 1239   GLUCOSE 113* 07/25/2013 0535   BUN 27* 02/04/2014 0045   BUN 23 02/03/2014 1239   BUN 44* 07/25/2013 0535   CREATININE 1.45* 02/04/2014 0045   CREATININE 0.99 02/03/2014 1239   CREATININE 1.85* 07/25/2013 0535   CALCIUM 8.4 02/04/2014 0045   CALCIUM 8.7 02/03/2014 1239   CALCIUM 9.0 07/25/2013 0535   GFRNONAA 38* 02/04/2014 0045   GFRNONAA 60* 02/03/2014 1239   GFRNONAA 28* 07/25/2013 0535   GFRAA 44* 02/04/2014 0045   GFRAA 70* 02/03/2014 1239   GFRAA 33* 07/25/2013 0535   CBC    Component Value Date/Time   WBC 5.9 02/03/2014 1239   RBC 5.19* 02/03/2014 1239   HGB 13.1 02/03/2014 1239   HCT 41.3 02/03/2014 1239   PLT 241 02/03/2014 1239   MCV 79.6 02/03/2014 1239   MCH 25.2* 02/03/2014 1239   MCHC 31.7 02/03/2014 1239   RDW 15.1 02/03/2014 1239   LYMPHSABS 0.9 02/03/2014 1239   MONOABS 0.3 02/03/2014 1239   EOSABS 0.0 02/03/2014 1239   BASOSABS 0.0 02/03/2014 1239   HEPATIC Function Panel  Recent Labs  07/20/13 0340 07/21/13 0405 02/03/14 1239  PROT 6.0 6.3 6.9   HEMOGLOBIN A1C No components found with this basename: HGA1C,  MPG   CARDIAC ENZYMES Lab Results  Component Value Date   CKTOTAL 59 09/07/2012   CKMB 3.2 09/07/2012   TROPONINI <0.30 02/04/2014   TROPONINI <0.30 02/03/2014   TROPONINI <0.30 02/03/2014   BNP  Recent Labs  07/22/13 0500 07/25/13 0535 02/03/14 1239  PROBNP 9121.0* 6734.0* 10231.0*   TSH  Recent Labs  02/03/14 1239  TSH 1.990   CHOLESTEROL No results found for this basename:  CHOL,  in the last 8760 hours  Scheduled Meds: . albumin human  12.5 g Intravenous Once  . amLODipine  5 mg Oral Daily  . aspirin EC  81 mg Oral Daily  . carvedilol  3.125 mg Oral BID WC  . cloNIDine  0.2 mg Oral QHS  . furosemide  80 mg Intravenous BID  . glipiZIDE  5 mg Oral BID AC  . heparin subcutaneous  5,000 Units Subcutaneous 3 times per day  . insulin aspart  0-15 Units Subcutaneous TID WC  . isosorbide-hydrALAZINE  1 tablet Oral TID  . pantoprazole  40 mg Oral Daily  . potassium chloride  20 mEq Oral TID  . sodium chloride  3 mL Intravenous Q12H  . spironolactone  25 mg Oral QHS   Continuous Infusions:  PRN Meds:.sodium chloride, acetaminophen, allopurinol, ALPRAZolam, nitroGLYCERIN, ondansetron (ZOFRAN) IV, sodium chloride  Assessment/Plan: Acute  systolic heart failure  Possible pneumonia  CAD  S/P Stent in LAD  DM, II  Hypertension  Anxiety  Continue medical treatment. Change to  Admission status.   LOS: 1 day    Tanya CobbAjay Jhaniya Briski  MD  02/04/2014, 8:39 AM

## 2014-02-04 NOTE — Progress Notes (Signed)
Utilization review completed.  

## 2014-02-05 LAB — BASIC METABOLIC PANEL
Anion gap: 17 — ABNORMAL HIGH (ref 5–15)
BUN: 33 mg/dL — AB (ref 6–23)
CALCIUM: 8.4 mg/dL (ref 8.4–10.5)
CO2: 21 meq/L (ref 19–32)
CREATININE: 1.65 mg/dL — AB (ref 0.50–1.10)
Chloride: 102 mEq/L (ref 96–112)
GFR calc Af Amer: 38 mL/min — ABNORMAL LOW (ref >90)
GFR, EST NON AFRICAN AMERICAN: 33 mL/min — AB (ref >90)
GLUCOSE: 49 mg/dL — AB (ref 70–99)
Potassium: 3.8 mEq/L (ref 3.7–5.3)
Sodium: 140 mEq/L (ref 137–147)

## 2014-02-05 LAB — GLUCOSE, CAPILLARY: Glucose-Capillary: 75 mg/dL (ref 70–99)

## 2014-02-05 MED ORDER — CARVEDILOL 3.125 MG PO TABS: 3.1250 mg | ORAL_TABLET | Freq: Two times a day (BID) | ORAL | Status: DC

## 2014-02-05 MED ORDER — FUROSEMIDE 80 MG PO TABS
80.0000 mg | ORAL_TABLET | Freq: Two times a day (BID) | ORAL | Status: DC
Start: 2014-02-06 — End: 2014-02-05
  Filled 2014-02-05: qty 1

## 2014-02-05 MED ORDER — ASPIRIN 81 MG PO TBEC: 81.0000 mg | DELAYED_RELEASE_TABLET | Freq: Every day | ORAL | Status: DC

## 2014-02-05 NOTE — Discharge Summary (Signed)
Physician Discharge Summary  Patient ID: [name] MRN: [id number] DOB/AGE: [birth year] 62 y.o.  Admit date: 02/03/2014 Discharge date: 02/05/2014  Admission Diagnoses: Acute systolic heart failure  Possible pneumonia  CAD  S/P Stent in LAD  DM, II  Hypertension  Anxiety  Discharge Diagnoses:  Principle Problem: * Acute left systolic heart failure * CAD  S/P Stent in LAD  DM, II  Hypertension  Anxiety Dehydration Mild renal dysfunction due to above Moderate pulmonary hypertension   Discharged Condition: fair  Hospital Course: 63 year old female withpast medical history significant for coronary artery disease status post anteroseptal wall myocardial infarction with occlusion of distal LAD in the recent past treated medically, status post PTCA stenting to proximal LAD in May of 2013, ischemic cardiomyopathy, history of congestive heart failure secondary to systolic dysfunction EF approximately 25-30%, hypertension, diabetes mellitus, hypercholesteremia, COPD, came to the office with leg edema and little shortness of breath for 2 weeks. She responded to IV lasix. Ted hose was also applied to decrease leg edema. Blood pressure control was improved. With mild renal dysfunction and dehydration her lasix was held for 24 hours. With low blood sugars glucotrol was discontinued. She will be followed by me in 1 week and was advised to decrease salt and fluid intake by 50 %.  Consults: cardiology  Significant Diagnostic Studies: labs: CBC near normal, CMET near normal except albumin of 3.1. [place] at [address]. Troponin-I negative x 3. INR-1.25.  Chest X-ray: Enlargement of cardiac silhouette with pulmonary vascular  congestion.  Mild bibasilar atelectasis.   Echocardiogram: Left ventricle: The cavity size was moderately dilated. There was mild concentric hypertrophy. Systolic function was severely reduced. The estimated ejection fraction was in the range of 20% to 25%. There is  severe hypokinesis of the entire myocardium. There is akinesis of the basal-midanterior myocardium. Doppler parameters are consistent with abnormal left ventricular relaxation (grade 1 diastolic dysfunction). - Aortic valve: There was mild regurgitation. - Mitral valve: There was severe regurgitation. - Left atrium: The atrium was moderately to severely dilated. - Right ventricle: The cavity size was mildly dilated. Wall thickness was normal. Systolic function was mildly reduced. - Atrial septum: No defect or patent foramen ovale was identified. - Tricuspid valve: There was severe regurgitation. - Pulmonary arteries: Systolic pressure was moderately to severely increased. PA peak pressure: 50 mm Hg (S).  Treatments: cardiac meds: carvedilol, amlodipine and furosemide  Discharge Exam: Blood pressure 130/76, pulse 68, temperature 97.6 F (36.4 C), temperature source Oral, resp. rate 18, height 5\' 5"  (1.651 m), weight 74.208 kg (163 lb 9.6 oz), SpO2 96.00%. Physical Exam:  HEENT: [place]/AT, Ave built and nourished. [employer] CorporationBrown eyes, PERL, EOMI.  Neck: + JVD, no bruit, no thyromegaly.  Lungs: Clear, bilateral.  Heart: Normal S1 and S2. Grade III/VI systolic murmur LSB  Abdomen: Soft, non-tender.  Ext: 1 + edema up to knees, bilaterally.  CNS: Cranial nerves grossly intact. Moves all 4 ectremities Skin: Warm and dry.   Disposition: 01-Home or Self Care     Medication List    STOP taking these medications       glipiZIDE 5 MG tablet  Commonly known as:  GLUCOTROL     warfarin 5 MG tablet  Commonly known as:  COUMADIN      TAKE these medications       amLODipine 5 MG tablet  Commonly known as:  NORVASC  Take 5 mg by mouth 2 (two) times daily.     aspirin 81  MG EC tablet  Take 1 tablet (81 mg total) by mouth daily.     carvedilol 3.125 MG tablet  Commonly known as:  COREG  Take 1 tablet (3.125 mg total) by mouth 2 (two) times daily with a meal.     cloNIDine 0.2 MG tablet   Commonly known as:  CATAPRES  Take 0.2 mg by mouth at bedtime.     furosemide 40 MG tablet  Commonly known as:  LASIX  Take 80 mg by mouth 2 (two) times daily.     isosorbide-hydrALAZINE 20-37.5 MG per tablet  Commonly known as:  BIDIL  Take 1 tablet by mouth 3 (three) times daily.     multivitamin with minerals Tabs tablet  Take 1 tablet by mouth daily.     nitroGLYCERIN 0.4 MG SL tablet  Commonly known as:  NITROSTAT  Place 0.4 mg under the tongue every 5 (five) minutes as needed for chest pain.     OMEGA 3 PO  Take 1 capsule by mouth daily.     omeprazole 20 MG capsule  Commonly known as:  PRILOSEC  Take 20 mg by mouth daily.     potassium chloride 10 MEQ tablet  Commonly known as:  K-DUR  Take 10 mEq by mouth daily as needed (when she starts to feel "sluggish"). 11/21/11 last dose.     spironolactone 25 MG tablet  Commonly known as:  ALDACTONE  Take 25 mg by mouth at bedtime.           Follow-up Information   Follow up with [facility] S, MD. Schedule an appointment as soon as possible for a visit in 1 week.   Specialty:  Cardiology   Contact information:   [address] [place] [address] [phone]       Signed: [name] 02/05/2014, 8:37 AM

## 2014-04-02 ENCOUNTER — Emergency Department (HOSPITAL_COMMUNITY): Payer: No Typology Code available for payment source

## 2014-04-02 ENCOUNTER — Inpatient Hospital Stay (HOSPITAL_COMMUNITY)
Admission: EM | Admit: 2014-04-02 | Discharge: 2014-04-09 | DRG: 293 | Disposition: A | Discharge status: Home / Self Care | Payer: No Typology Code available for payment source | Attending: Cardiovascular Disease | Admitting: Cardiovascular Disease

## 2014-04-02 ENCOUNTER — Encounter (HOSPITAL_COMMUNITY): Payer: Self-pay | Admitting: Emergency Medicine

## 2014-04-02 DIAGNOSIS — E119 Type 2 diabetes mellitus without complications: Secondary | ICD-10-CM | POA: Diagnosis present

## 2014-04-02 DIAGNOSIS — Z9861 Coronary angioplasty status: Secondary | ICD-10-CM

## 2014-04-02 DIAGNOSIS — I5021 Acute systolic (congestive) heart failure: Secondary | ICD-10-CM | POA: Diagnosis present

## 2014-04-02 DIAGNOSIS — F411 Generalized anxiety disorder: Secondary | ICD-10-CM | POA: Diagnosis present

## 2014-04-02 DIAGNOSIS — I10 Essential (primary) hypertension: Secondary | ICD-10-CM | POA: Diagnosis present

## 2014-04-02 DIAGNOSIS — I509 Heart failure, unspecified: Secondary | ICD-10-CM | POA: Diagnosis present

## 2014-04-02 DIAGNOSIS — I2510 Atherosclerotic heart disease of native coronary artery without angina pectoris: Secondary | ICD-10-CM | POA: Diagnosis present

## 2014-04-02 LAB — CBC
HCT: 43.4 % (ref 36.0–46.0)
HEMATOCRIT: 38.5 % (ref 36.0–46.0)
HEMOGLOBIN: 12.8 g/dL (ref 12.0–15.0)
Hemoglobin: 14.5 g/dL (ref 12.0–15.0)
MCH: 24.7 pg — ABNORMAL LOW (ref 26.0–34.0)
MCH: 24.6 pg — AB (ref 26.0–34.0)
MCHC: 33.4 g/dL (ref 30.0–36.0)
MCHC: 33.2 g/dL (ref 30.0–36.0)
MCV: 74.1 fL — AB (ref 78.0–100.0)
MCV: 74 fL — AB (ref 78.0–100.0)
PLATELETS: 244 10*3/uL (ref 150–400)
Platelets: 208 10*3/uL (ref 150–400)
RBC: 5.86 MIL/uL — ABNORMAL HIGH (ref 3.87–5.11)
RBC: 5.2 MIL/uL — AB (ref 3.87–5.11)
RDW: 18.3 % — ABNORMAL HIGH (ref 11.5–15.5)
RDW: 18.3 % — ABNORMAL HIGH (ref 11.5–15.5)
WBC: 5.5 10*3/uL (ref 4.0–10.5)
WBC: 4.4 10*3/uL (ref 4.0–10.5)

## 2014-04-02 LAB — CREATININE, SERUM
Creatinine, Ser: 1.2 mg/dL — ABNORMAL HIGH (ref 0.50–1.10)
GFR calc Af Amer: 55 mL/min — ABNORMAL LOW (ref >90)
GFR calc non Af Amer: 47 mL/min — ABNORMAL LOW (ref >90)

## 2014-04-02 LAB — BASIC METABOLIC PANEL
ANION GAP: 17 — AB (ref 5–15)
BUN: 28 mg/dL — ABNORMAL HIGH (ref 6–23)
CO2: 23 meq/L (ref 19–32)
Calcium: 9.5 mg/dL (ref 8.4–10.5)
Chloride: 101 mEq/L (ref 96–112)
Creatinine, Ser: 1.22 mg/dL — ABNORMAL HIGH (ref 0.50–1.10)
GFR calc Af Amer: 54 mL/min — ABNORMAL LOW (ref >90)
GFR calc non Af Amer: 46 mL/min — ABNORMAL LOW (ref >90)
Glucose, Bld: 121 mg/dL — ABNORMAL HIGH (ref 70–99)
POTASSIUM: 3.8 meq/L (ref 3.7–5.3)
SODIUM: 141 meq/L (ref 137–147)

## 2014-04-02 LAB — GLUCOSE, CAPILLARY: Glucose-Capillary: 159 mg/dL — ABNORMAL HIGH (ref 70–99)

## 2014-04-02 LAB — TROPONIN I
Troponin I: <0.3 ng/mL (ref <0.30)
Troponin I: <0.3 ng/mL (ref <0.30)

## 2014-04-02 LAB — I-STAT TROPONIN, ED: TROPONIN I, POC: 0 ng/mL (ref 0.00–0.08)

## 2014-04-02 LAB — PRO B NATRIURETIC PEPTIDE: PRO B NATRI PEPTIDE: 11219 pg/mL — AB (ref 0–125)

## 2014-04-02 MED ORDER — POTASSIUM CHLORIDE CRYS ER 10 MEQ PO TBCR
10.0000 meq | EXTENDED_RELEASE_TABLET | Freq: Two times a day (BID) | ORAL | Status: DC
  Administered 2014-04-02 – 2014-04-03 (×3): 10 meq via ORAL
  Filled 2014-04-02 (×5): qty 1

## 2014-04-02 MED ORDER — SODIUM CHLORIDE 0.9 % IV SOLN: 250.0000 mL | INTRAVENOUS | Status: DC | PRN

## 2014-04-02 MED ORDER — FUROSEMIDE 10 MG/ML IJ SOLN
40.0000 mg | Freq: Two times a day (BID) | INTRAMUSCULAR | Status: DC
  Administered 2014-04-02 – 2014-04-04 (×5): 40 mg via INTRAVENOUS
  Filled 2014-04-02 (×7): qty 4

## 2014-04-02 MED ORDER — HEPARIN SODIUM (PORCINE) 5000 UNIT/ML IJ SOLN
5000.0000 [IU] | Freq: Three times a day (TID) | INTRAMUSCULAR | Status: DC
Start: 2014-04-02 — End: 2014-04-07
  Administered 2014-04-02 – 2014-04-07 (×14): 5000 [IU] via SUBCUTANEOUS
  Filled 2014-04-02 (×15): qty 1

## 2014-04-02 MED ORDER — ALLOPURINOL 100 MG PO TABS
100.0000 mg | ORAL_TABLET | Freq: Two times a day (BID) | ORAL | Status: DC
  Administered 2014-04-02 – 2014-04-08 (×14): 100 mg via ORAL
  Filled 2014-04-02 (×16): qty 1

## 2014-04-02 MED ORDER — ONDANSETRON HCL 4 MG/2ML IJ SOLN: 4.0000 mg | Freq: Four times a day (QID) | INTRAMUSCULAR | Status: DC | PRN

## 2014-04-02 MED ORDER — ASPIRIN EC 81 MG PO TBEC
81.0000 mg | DELAYED_RELEASE_TABLET | Freq: Every day | ORAL | Status: DC
  Administered 2014-04-02 – 2014-04-08 (×7): 81 mg via ORAL
  Filled 2014-04-02 (×8): qty 1

## 2014-04-02 MED ORDER — SODIUM CHLORIDE 0.9 % IJ SOLN
3.0000 mL | Freq: Two times a day (BID) | INTRAMUSCULAR | Status: DC
  Administered 2014-04-02 – 2014-04-08 (×14): 3 mL via INTRAVENOUS

## 2014-04-02 MED ORDER — SPIRONOLACTONE 25 MG PO TABS
25.0000 mg | ORAL_TABLET | Freq: Every day | ORAL | Status: DC
  Administered 2014-04-02 – 2014-04-08 (×7): 25 mg via ORAL
  Filled 2014-04-02 (×8): qty 1

## 2014-04-02 MED ORDER — NITROGLYCERIN 0.4 MG SL SUBL: 0.4000 mg | SUBLINGUAL_TABLET | SUBLINGUAL | Status: DC | PRN

## 2014-04-02 MED ORDER — ACETAMINOPHEN 325 MG PO TABS
650.0000 mg | ORAL_TABLET | ORAL | Status: DC | PRN
  Administered 2014-04-03: 650 mg via ORAL
  Filled 2014-04-02: qty 2

## 2014-04-02 MED ORDER — SODIUM CHLORIDE 0.9 % IJ SOLN: 3.0000 mL | INTRAMUSCULAR | Status: DC | PRN

## 2014-04-02 MED ORDER — ISOSORB DINITRATE-HYDRALAZINE 20-37.5 MG PO TABS
1.0000 | ORAL_TABLET | Freq: Three times a day (TID) | ORAL | Status: DC
  Administered 2014-04-02 – 2014-04-04 (×8): 1 via ORAL
  Filled 2014-04-02 (×9): qty 1

## 2014-04-02 MED ORDER — AMLODIPINE BESYLATE 5 MG PO TABS
5.0000 mg | ORAL_TABLET | Freq: Two times a day (BID) | ORAL | Status: DC
  Administered 2014-04-02 – 2014-04-08 (×13): 5 mg via ORAL
  Filled 2014-04-02 (×16): qty 1

## 2014-04-02 MED ORDER — DIGOXIN 125 MCG PO TABS
0.1250 mg | ORAL_TABLET | Freq: Every day | ORAL | Status: DC
  Administered 2014-04-03 – 2014-04-08 (×6): 0.125 mg via ORAL
  Filled 2014-04-02 (×8): qty 1

## 2014-04-02 MED ORDER — PNEUMOCOCCAL VAC POLYVALENT 25 MCG/0.5ML IJ INJ
0.5000 mL | INJECTION | INTRAMUSCULAR | Status: AC
  Administered 2014-04-03: 0.5 mL via INTRAMUSCULAR
  Filled 2014-04-02: qty 0.5

## 2014-04-02 MED ORDER — FUROSEMIDE 10 MG/ML IJ SOLN
40.0000 mg | Freq: Once | INTRAMUSCULAR | Status: AC
  Administered 2014-04-02: 40 mg via INTRAVENOUS
  Filled 2014-04-02: qty 4

## 2014-04-02 MED ORDER — CLONIDINE HCL 0.2 MG PO TABS
0.2000 mg | ORAL_TABLET | Freq: Once | ORAL | Status: AC
  Administered 2014-04-02: 0.2 mg via ORAL
  Filled 2014-04-02: qty 1

## 2014-04-02 MED ORDER — CARVEDILOL 3.125 MG PO TABS
3.1250 mg | ORAL_TABLET | Freq: Two times a day (BID) | ORAL | Status: DC
  Administered 2014-04-02 – 2014-04-09 (×14): 3.125 mg via ORAL
  Filled 2014-04-02 (×18): qty 1

## 2014-04-02 MED ORDER — CLONIDINE HCL 0.2 MG PO TABS
0.2000 mg | ORAL_TABLET | Freq: Every day | ORAL | Status: DC
  Administered 2014-04-03 – 2014-04-08 (×5): 0.2 mg via ORAL
  Filled 2014-04-02 (×8): qty 1

## 2014-04-02 MED ORDER — INFLUENZA VAC SPLIT QUAD 0.5 ML IM SUSY
0.5000 mL | PREFILLED_SYRINGE | INTRAMUSCULAR | Status: AC
  Administered 2014-04-03: 0.5 mL via INTRAMUSCULAR
  Filled 2014-04-02: qty 0.5

## 2014-04-02 NOTE — ED Notes (Signed)
MD at bedside. [name]

## 2014-04-02 NOTE — ED Notes (Signed)
MD at bedside. 

## 2014-04-02 NOTE — H&P (Signed)
Referring Physician:  DALTON [name] is an 63 y.o. female.                       Chief Complaint: Shortness of breath.  HPI: 63 year old female with leg edema and progressive worsening of shortness of breath x 1 month. No chest pain, fever or cough.   Past Medical History  Diagnosis Date  . Hypertension   . Diabetes mellitus   . Heart failure   . Coronary artery disease   . Stented coronary artery   . CHF (congestive heart failure)       History reviewed. No pertinent past surgical history.  History reviewed. No pertinent family history. Social History:  reports that she has never smoked. She does not have any smokeless tobacco history on file. She reports that she does not drink alcohol or use illicit drugs.  Allergies: No Known Allergies   (Not in a hospital admission)  Results for orders placed during the hospital encounter of 04/02/14 (from the past 48 hour(s))  CBC     Status: Abnormal   Collection Time    04/02/14  8:43 AM      Result Value Ref Range   WBC 5.5  4.0 - 10.5 K/uL   RBC 5.86 (*) 3.87 - 5.11 MIL/uL   Hemoglobin 14.5  12.0 - 15.0 g/dL   HCT 43.4  36.0 - 46.0 %   MCV 74.1 (*) 78.0 - 100.0 fL   MCH 24.7 (*) 26.0 - 34.0 pg   MCHC 33.4  30.0 - 36.0 g/dL   RDW 18.3 (*) 11.5 - 15.5 %   Platelets 244  150 - 400 K/uL  BASIC METABOLIC PANEL     Status: Abnormal   Collection Time    04/02/14  8:43 AM      Result Value Ref Range   Sodium 141  137 - 147 mEq/L   Potassium 3.8  3.7 - 5.3 mEq/L   Chloride 101  96 - 112 mEq/L   CO2 23  19 - 32 mEq/L   Glucose, Bld 121 (*) 70 - 99 mg/dL   BUN 28 (*) 6 - 23 mg/dL   Creatinine, Ser 1.22 (*) 0.50 - 1.10 mg/dL   Calcium 9.5  8.4 - 10.5 mg/dL   GFR calc non Af Amer 46 (*) >90 mL/min   GFR calc Af Amer 54 (*) >90 mL/min   Comment: (NOTE)     The eGFR has been calculated using the CKD EPI equation.     This calculation has not been validated in all clinical situations.     eGFR's persistently <90 mL/min signify  possible Chronic Kidney     Disease.   Anion gap 17 (*) 5 - 15  PRO B NATRIURETIC PEPTIDE     Status: Abnormal   Collection Time    04/02/14  8:43 AM      Result Value Ref Range   Pro B Natriuretic peptide (BNP) 11219.0 (*) 0 - 125 pg/mL  I-STAT TROPOININ, ED     Status: None   Collection Time    04/02/14  8:54 AM      Result Value Ref Range   Troponin i, poc 0.00  0.00 - 0.08 ng/mL   Comment 3            Comment: Due to the release kinetics of cTnI,     a negative result within the first hours     of the  onset of symptoms does not rule out     myocardial infarction with certainty.     If myocardial infarction is still suspected,     repeat the test at appropriate intervals.   Dg Chest 2 View  04/02/2014   CLINICAL DATA:  Shortness of breath, history hypertension, diabetes, coronary artery disease, CHF  EXAM: CHEST  2 VIEW  COMPARISON:  02/04/2014  FINDINGS: Enlargement of cardiac silhouette with pulmonary vascular congestion.  Atherosclerotic calcification mild tortuosity of thoracic aorta.  Coronary arterial stents noted.  Small LEFT pleural effusion.  Bibasilar atelectasis greater on LEFT.  No upper pulmonary edema, segmental consolidation or pneumothorax.  Bones unremarkable.  IMPRESSION: Enlargement of cardiac silhouette with pulmonary vascular congestion.  Small pleural effusion with bibasilar atelectasis greater on LEFT.   Electronically Signed   By: Lavonia Dana M.D.   On: 04/02/2014 09:10    Review Of Systems + wears glasses, + weight gain + partial dentures, + COPD, + angina, + leg edema, + hypertension, + DM, II, + CHF, No CVA, No seizures, No kidney stone, + arthritis.   Blood pressure 152/89, pulse 103, temperature 98.7 F (37.1 C), temperature source Oral, resp. rate 20, SpO2 100.00%.  Physical Exam:  HEENT: [place]/AT, Ave built and nourished. [employer], PERL, EOMI.  Neck: + JVD, no bruit, no thyromegaly.  Lungs: Bilateral basal crackles.  Heart: Normal S1 and S2. Grade  III/VI systolic murmur LSB  Abdomen: Mild swelling, non-tender.  Ext: 2 + edema up to knees, bilaterally.  CNS: Cranial nerves grossly intact. Moves all 4 extremities. Skin: Warm and dry.  Assessment/Plan Acute Biventricular systolic heart failure  CAD  S/P Stent in LAD  DM, II  Hypertension  Anxiety  Admit/IV lasix. R/O MI   [facility] S, MD  04/02/2014, 10:11 AM

## 2014-04-02 NOTE — ED Notes (Signed)
Pt reports having sob and not feeling well for extended amount of time. Having swelling to legs and pain in her legs. Sob with exertion. Hx of chf. ekg done at triage.

## 2014-04-02 NOTE — Care Management Note (Signed)
    Page 1 of 1   04/02/2014     3:33:55 PM CARE MANAGEMENT NOTE 04/02/2014  Patient:  [name], [name]   Account Number:  [id number]  Date Initiated:  04/02/2014  Documentation initiated by:  [facility]  Subjective/Objective Assessment:   63 year old female with leg edema and progressive worsening of shortness of breath x 1 month. No chest pain, fever or cough. //Home with spouse.     Action/Plan:   Admit/IV lasix.  R/O MI  //Access for disposition needs.   Anticipated DC Date:  04/06/2014   Anticipated DC Plan:           [facility]  HOME HEALTH   Choice offered to / List presented to:             Status of service:  In process, will continue to follow Medicare Important Message given?   (If response is "NO", the following Medicare IM given date fields will be blank) Date Medicare IM given:   Medicare IM given by:   Date Additional Medicare IM given:   Additional Medicare IM given by:    Discharge Disposition:    Per UR Regulation:    If discussed at Long Length of Stay Meetings, dates discussed:    Comments:

## 2014-04-02 NOTE — ED Notes (Signed)
RN attempted 2 IV. Will get another RN to attempt

## 2014-04-02 NOTE — ED Provider Notes (Signed)
CSN: [id number]     Arrival date & time 04/02/14  4098 History   First MD Initiated Contact with Patient 04/02/14 [phone]     Chief Complaint  Patient presents with  . Shortness of Breath      HPI Pt was seen at 0920. Per pt, c/o gradual onset and worsening of persistent SOB and pedal edema for the past 1 month. Pt states she has not taken any of her medications today, and has not taken her clonidine in the past 2 days. Denies CP/palpitations, no cough, no back pain, no N/V/D, no abd pain.    Past Medical History  Diagnosis Date  . Hypertension   . Diabetes mellitus   . Heart failure   . Coronary artery disease   . Stented coronary artery   . CHF (congestive heart failure)    History reviewed. No pertinent past surgical history.  History  Substance Use Topics  . Smoking status: Never Smoker   . Smokeless tobacco: Not on file  . Alcohol Use: No    Review of Systems ROS: Statement: All systems negative except as marked or noted in the HPI; Constitutional: Negative for fever and chills. ; ; Eyes: Negative for eye pain, redness and discharge. ; ; ENMT: Negative for ear pain, hoarseness, nasal congestion, sinus pressure and sore throat. ; ; Cardiovascular: Negative for chest pain, palpitations, diaphoresis, +dyspnea and peripheral edema. ; ; Respiratory: Negative for cough, wheezing and stridor. ; ; Gastrointestinal: Negative for nausea, vomiting, diarrhea, abdominal pain, blood in stool, hematemesis, jaundice and rectal bleeding. . ; ; Genitourinary: Negative for dysuria, flank pain and hematuria. ; ; Musculoskeletal: Negative for back pain and neck pain. Negative for swelling and trauma.; ; Skin: Negative for pruritus, rash, abrasions, blisters, bruising and skin lesion.; ; Neuro: Negative for headache, lightheadedness and neck stiffness. Negative for weakness, altered level of consciousness , altered mental status, extremity weakness, paresthesias, involuntary movement, seizure and  syncope.      Allergies  Review of patient's allergies indicates no known allergies.  Home Medications   Prior to Admission medications   Medication Sig Start Date End Date Taking? Authorizing Provider  allopurinol (ZYLOPRIM) 100 MG tablet Take 100 mg by mouth 2 (two) times daily.   Yes Historical Provider, MD  amLODipine (NORVASC) 5 MG tablet Take 5 mg by mouth 2 (two) times daily. 11/29/11  Yes [name], MD  aspirin EC 81 MG EC tablet Take 1 tablet (81 mg total) by mouth daily. 02/05/14  Yes [name], MD  carvedilol (COREG) 3.125 MG tablet Take 1 tablet (3.125 mg total) by mouth 2 (two) times daily with a meal. 02/05/14  Yes [name], MD  cloNIDine (CATAPRES) 0.2 MG tablet Take 0.2 mg by mouth at bedtime. 09/10/12  Yes [name], MD  furosemide (LASIX) 40 MG tablet Take 80 mg by mouth 2 (two) times daily. 09/10/12  Yes [name], MD  isosorbide-hydrALAZINE (BIDIL) 20-37.5 MG per tablet Take 1 tablet by mouth 3 (three) times daily. 09/10/12  Yes [name], MD  nitroGLYCERIN (NITROSTAT) 0.4 MG SL tablet Place 0.4 mg under the tongue every 5 (five) minutes as needed for chest pain. 09/10/12  Yes [name], MD  potassium chloride (K-DUR) 10 MEQ tablet Take 10 mEq by mouth daily as needed (when she starts to feel "sluggish"). 11/21/11 last dose. 07/25/13  Yes [name], MD  spironolactone (ALDACTONE) 25 MG tablet Take 25 mg by mouth  at bedtime. 07/25/13  Yes [name], MD   BP 184/98  Pulse 122  Temp(Src) 98.7 F (37.1 C) (Oral)  Resp 22  SpO2 100% Physical Exam 0925: Physical examination:  Nursing notes reviewed; Vital signs and O2 SAT reviewed;  Constitutional: Well developed, Well nourished, Well hydrated, In no acute distress; Head:  Normocephalic, atraumatic; Eyes: EOMI, PERRL, No scleral icterus; ENMT: Mouth and pharynx normal, Mucous membranes moist; Neck: Supple, Full range of motion, No lymphadenopathy; Cardiovascular: Tachycardic rate and rhythm,  No gallop; Respiratory: Breath sounds coarse & equal bilaterally, No wheezes.  Speaking full sentences with ease, Normal respiratory effort/excursion; Chest: Nontender, Movement normal; Abdomen: Soft, Nontender, Nondistended, Normal bowel sounds; Genitourinary: No CVA tenderness; Extremities: Pulses normal, No tenderness, +2 edema bilat LE's feet to thighs. No calf edema or asymmetry.; Neuro: AA&Ox3, Major CN grossly intact.  Speech clear. No gross focal motor or sensory deficits in extremities.; Skin: Color normal, Warm, Dry.   ED Course  Procedures     EKG Interpretation   Date/Time:  Wednesday April 02 2014 08:33:17 EDT Ventricular Rate:  121 PR Interval:  152 QRS Duration: 90 QT Interval:  338 QTC Calculation: 479 R Axis:   140 Text Interpretation:  Sinus tachycardia Left atrial enlargement Right axis  deviation Abnormal ECG When compared with ECG of 07/18/2013 No significant  change was found Confirmed by [facility]  MD, Nicholos Johns [phone]) on 04/02/2014  9:42:18 AM      MDM  MDM Reviewed: previous chart, nursing note and vitals Reviewed previous: labs and ECG Interpretation: labs, ECG and x-ray    Results for orders placed during the hospital encounter of 04/02/14  CBC      Result Value Ref Range   WBC 5.5  4.0 - 10.5 K/uL   RBC 5.86 (*) 3.87 - 5.11 MIL/uL   Hemoglobin 14.5  12.0 - 15.0 g/dL   HCT 98.1  19.1 - 47.8 %   MCV 74.1 (*) 78.0 - 100.0 fL   MCH 24.7 (*) 26.0 - 34.0 pg   MCHC 33.4  30.0 - 36.0 g/dL   RDW 29.5 (*) 62.1 - 30.8 %   Platelets 244  150 - 400 K/uL  BASIC METABOLIC PANEL      Result Value Ref Range   Sodium 141  137 - 147 mEq/L   Potassium 3.8  3.7 - 5.3 mEq/L   Chloride 101  96 - 112 mEq/L   CO2 23  19 - 32 mEq/L   Glucose, Bld 121 (*) 70 - 99 mg/dL   BUN 28 (*) 6 - 23 mg/dL   Creatinine, Ser 6.57 (*) 0.50 - 1.10 mg/dL   Calcium 9.5  8.4 - 84.6 mg/dL   GFR calc non Af Amer 46 (*) >90 mL/min   GFR calc Af Amer 54 (*) >90 mL/min   Anion gap  17 (*) 5 - 15  PRO B NATRIURETIC PEPTIDE      Result Value Ref Range   Pro B Natriuretic peptide (BNP) 11219.0 (*) 0 - 125 pg/mL  I-STAT TROPOININ, ED      Result Value Ref Range   Troponin i, poc 0.00  0.00 - 0.08 ng/mL   Comment 3            Dg Chest 2 View 04/02/2014   CLINICAL DATA:  Shortness of breath, history hypertension, diabetes, coronary artery disease, CHF  EXAM: CHEST  2 VIEW  COMPARISON:  02/04/2014  FINDINGS: Enlargement of cardiac silhouette with pulmonary vascular congestion.  Atherosclerotic calcification mild tortuosity of thoracic aorta.  Coronary arterial stents noted.  Small LEFT pleural effusion.  Bibasilar atelectasis greater on LEFT.  No upper pulmonary edema, segmental consolidation or pneumothorax.  Bones unremarkable.  IMPRESSION: Enlargement of cardiac silhouette with pulmonary vascular congestion.  Small pleural effusion with bibasilar atelectasis greater on LEFT.   Electronically Signed   By: Ulyses Southward M.D.   On: 04/02/2014 09:10    0945:  BNP elevated from previous; will dose IV lasix. Pt has not taken her clonidine in 2 days, will dose now. Dx and testing d/w pt.  Questions answered.  Verb understanding, agreeable to admit.  T/C to [name], case discussed, including:  HPI, pertinent PM/SHx, VS/PE, dx testing, ED course and treatment:  Agreeable to admit, requests he will come to the ED for evaluation.   Samuel Jester, DO 04/02/14 1824

## 2014-04-02 NOTE — ED Notes (Signed)
Attempted report 

## 2014-04-03 LAB — BASIC METABOLIC PANEL
Anion gap: 12 (ref 5–15)
BUN: 31 mg/dL — ABNORMAL HIGH (ref 6–23)
CHLORIDE: 102 meq/L (ref 96–112)
CO2: 24 meq/L (ref 19–32)
Calcium: 8.4 mg/dL (ref 8.4–10.5)
Creatinine, Ser: 1.37 mg/dL — ABNORMAL HIGH (ref 0.50–1.10)
GFR calc Af Amer: 47 mL/min — ABNORMAL LOW (ref >90)
GFR calc non Af Amer: 40 mL/min — ABNORMAL LOW (ref >90)
GLUCOSE: 126 mg/dL — AB (ref 70–99)
Potassium: 3.9 mEq/L (ref 3.7–5.3)
SODIUM: 138 meq/L (ref 137–147)

## 2014-04-03 LAB — TROPONIN I: Troponin I: <0.3 ng/mL (ref <0.30)

## 2014-04-03 LAB — GLUCOSE, CAPILLARY
Glucose-Capillary: 179 mg/dL — ABNORMAL HIGH (ref 70–99)
Glucose-Capillary: 157 mg/dL — ABNORMAL HIGH (ref 70–99)

## 2014-04-03 MED ORDER — ALBUMIN HUMAN 25 % IV SOLN
12.5000 g | Freq: Once | INTRAVENOUS | Status: AC
  Administered 2014-04-03: 12.5 g via INTRAVENOUS
  Filled 2014-04-03: qty 50

## 2014-04-03 NOTE — Progress Notes (Signed)
Ref: Tanya Rodriguez, MD   Subjective:  Breathing improving. Leg edema persist. Afebrile.  Objective:  Vital Signs in the last 24 hours: Temp:  [97.4 F (36.3 C)-98.7 F (37.1 C)] 98.6 F (37 C) (09/24 0611) Pulse Rate:  [72-122] 78 (09/24 0611) Cardiac Rhythm:  [-] Normal sinus rhythm (09/23 2030) Resp:  [17-36] 17 (09/24 0611) BP: (99-184)/(60-104) 113/73 mmHg (09/24 0611) SpO2:  [94 %-100 %] 97 % (09/24 0611) Weight:  [78.608 kg (173 lb 4.8 oz)-78.9 kg (173 lb 15.1 oz)] 78.9 kg (173 lb 15.1 oz) (09/24 1610)  Physical Exam: BP Readings from Last 1 Encounters:  04/03/14 113/73    Wt Readings from Last 1 Encounters:  04/03/14 78.9 kg (173 lb 15.1 oz)    Weight change:   HEENT: [name], Eyes-Brown, PERL, EOMI, Conjunctiva-Pink, Sclera-Non-icteric Neck: + JVD, No bruit, Trachea midline. Lungs:  Basal crackles, bilateral. Cardiac:  Regular rhythm, normal S1 and S2, no S3. III/VI systolic murmur. Abdomen:  Soft, non-tender. Extremities:  2 + lower leg edema present. No cyanosis. No clubbing. CNS: AxOx3, Cranial nerves grossly intact, moves all 4 extremities.  Skin: Warm and dry.   Intake/Output from previous day: 09/23 0701 - 09/24 0700 In: 340 [P.O.:340] Out: 1075 [Urine:1075]    Lab Results: BMET    Component Value Date/Time   NA 138 04/03/2014 0327   NA 141 04/02/2014 0843   NA 140 02/05/2014 0523   K 3.9 04/03/2014 0327   K 3.8 04/02/2014 0843   K 3.8 02/05/2014 0523   CL 102 04/03/2014 0327   CL 101 04/02/2014 0843   CL 102 02/05/2014 0523   CO2 24 04/03/2014 0327   CO2 23 04/02/2014 0843   CO2 21 02/05/2014 0523   GLUCOSE 126* 04/03/2014 0327   GLUCOSE 121* 04/02/2014 0843   GLUCOSE 49* 02/05/2014 0523   BUN 31* 04/03/2014 0327   BUN 28* 04/02/2014 0843   BUN 33* 02/05/2014 0523   CREATININE 1.37* 04/03/2014 0327   CREATININE 1.20* 04/02/2014 1710   CREATININE 1.22* 04/02/2014 0843   CALCIUM 8.4 04/03/2014 0327   CALCIUM 9.5 04/02/2014 0843   CALCIUM 8.4 02/05/2014 0523   GFRNONAA 40* 04/03/2014 0327   GFRNONAA 47* 04/02/2014 1710   GFRNONAA 46* 04/02/2014 0843   GFRAA 47* 04/03/2014 0327   GFRAA 55* 04/02/2014 1710   GFRAA 54* 04/02/2014 0843   CBC    Component Value Date/Time   WBC 4.4 04/02/2014 1710   RBC 5.20* 04/02/2014 1710   HGB 12.8 04/02/2014 1710   HCT 38.5 04/02/2014 1710   PLT 208 04/02/2014 1710   MCV 74.0* 04/02/2014 1710   MCH 24.6* 04/02/2014 1710   MCHC 33.2 04/02/2014 1710   RDW 18.3* 04/02/2014 1710   LYMPHSABS 0.9 02/03/2014 1239   MONOABS 0.3 02/03/2014 1239   EOSABS 0.0 02/03/2014 1239   BASOSABS 0.0 02/03/2014 1239   HEPATIC Function Panel  Recent Labs  07/20/13 0340 07/21/13 0405 02/03/14 1239  PROT 6.0 6.3 6.9   HEMOGLOBIN A1C No components found with this basename: HGA1C,  MPG   CARDIAC ENZYMES Lab Results  Component Value Date   CKTOTAL 59 09/07/2012   CKMB 3.2 09/07/2012   TROPONINI <0.30 04/03/2014   TROPONINI <0.30 04/02/2014   TROPONINI <0.30 04/02/2014   BNP  Recent Labs  07/25/13 0535 02/03/14 1239 04/02/14 0843  PROBNP 6734.0* 10231.0* 11219.0*   TSH  Recent Labs  02/03/14 1239  TSH 1.990   CHOLESTEROL No results found for this basename: CHOL,  in the last 8760 hours  Scheduled Meds: . albumin human  12.5 g Intravenous Once  . allopurinol  100 mg Oral BID  . amLODipine  5 mg Oral BID  . aspirin EC  81 mg Oral Daily  . carvedilol  3.125 mg Oral BID WC  . cloNIDine  0.2 mg Oral QHS  . digoxin  0.125 mg Oral Daily  . furosemide  40 mg Intravenous BID  . heparin  5,000 Units Subcutaneous 3 times per day  . Influenza vac split quadrivalent PF  0.5 mL Intramuscular Tomorrow-1000  . isosorbide-hydrALAZINE  1 tablet Oral TID  . pneumococcal 23 valent vaccine  0.5 mL Intramuscular Tomorrow-1000  . potassium chloride  10 mEq Oral BID  . sodium chloride  3 mL Intravenous Q12H  . spironolactone  25 mg Oral QHS   Continuous Infusions:  PRN Meds:.sodium chloride, acetaminophen, nitroGLYCERIN, ondansetron  (ZOFRAN) IV, sodium chloride  Assessment/Plan: Acute Biventricular systolic heart failure  CAD  S/P Stent in LAD  DM, II  Hypertension  Anxiety  Continue lasix. MI ruled out.     LOS: 1 day    [name]  MD  04/03/2014, 8:16 AM

## 2014-04-03 NOTE — Progress Notes (Signed)
Pharmacist Heart Failure Core Measure Documentation  Assessment: [name] has an EF documented as 20-25% on 02/03/2014 by Echo.  Rationale: Heart failure patients with left ventricular systolic dysfunction (LVSD) and an EF < 40% should be prescribed an angiotensin converting enzyme inhibitor (ACEI) or angiotensin receptor blocker (ARB) at discharge unless a contraindication is documented in the medical record.  This patient is not currently on an ACEI or ARB for HF.  This note is being placed in the record in order to provide documentation that a contraindication to the use of these agents is present for this encounter.  ACE Inhibitor or Angiotensin Receptor Blocker is contraindicated (specify all that apply)    ACEI allergy AND ARB allergy   Angioedema   Moderate or severe aortic stenosis   Hyperkalemia   Hypotension   Renal artery stenosis   Worsening renal function, preexisting renal disease or dysfunction   Riki Rusk 04/03/2014 3:44 PM

## 2014-04-04 LAB — COMPREHENSIVE METABOLIC PANEL
ALK PHOS: 78 U/L (ref 39–117)
ALT: 7 U/L (ref 0–35)
ANION GAP: 14 (ref 5–15)
AST: 15 U/L (ref 0–37)
Albumin: 2.5 g/dL — ABNORMAL LOW (ref 3.5–5.2)
BUN: 34 mg/dL — ABNORMAL HIGH (ref 6–23)
CALCIUM: 8.3 mg/dL — AB (ref 8.4–10.5)
CO2: 22 mEq/L (ref 19–32)
Chloride: 105 mEq/L (ref 96–112)
Creatinine, Ser: 1.53 mg/dL — ABNORMAL HIGH (ref 0.50–1.10)
GFR calc Af Amer: 41 mL/min — ABNORMAL LOW (ref >90)
GFR calc non Af Amer: 35 mL/min — ABNORMAL LOW (ref >90)
Glucose, Bld: 104 mg/dL — ABNORMAL HIGH (ref 70–99)
POTASSIUM: 4 meq/L (ref 3.7–5.3)
SODIUM: 141 meq/L (ref 137–147)
TOTAL PROTEIN: 5.8 g/dL — AB (ref 6.0–8.3)
Total Bilirubin: 0.8 mg/dL (ref 0.3–1.2)

## 2014-04-04 MED ORDER — POTASSIUM CHLORIDE CRYS ER 10 MEQ PO TBCR
10.0000 meq | EXTENDED_RELEASE_TABLET | Freq: Every day | ORAL | Status: DC
  Administered 2014-04-04 – 2014-04-08 (×5): 10 meq via ORAL
  Filled 2014-04-04 (×6): qty 1

## 2014-04-04 MED ORDER — FUROSEMIDE 10 MG/ML IJ SOLN
80.0000 mg | Freq: Once | INTRAMUSCULAR | Status: AC
  Administered 2014-04-04: 80 mg via INTRAVENOUS
  Filled 2014-04-04: qty 8

## 2014-04-04 MED ORDER — FUROSEMIDE 10 MG/ML IJ SOLN
80.0000 mg | Freq: Two times a day (BID) | INTRAMUSCULAR | Status: DC
  Administered 2014-04-05 – 2014-04-09 (×9): 80 mg via INTRAVENOUS
  Filled 2014-04-04 (×10): qty 8

## 2014-04-04 MED ORDER — ALBUMIN HUMAN 25 % IV SOLN
12.5000 g | Freq: Once | INTRAVENOUS | Status: AC
  Administered 2014-04-04: 12.5 g via INTRAVENOUS
  Filled 2014-04-04 (×2): qty 50

## 2014-04-04 NOTE — Progress Notes (Signed)
Ref: [name], MD   Subjective:  No significant diuresis. Tongue-drier. Leg edema persist.  Objective:  Vital Signs in the last 24 hours: Temp:  [97.5 F (36.4 C)-97.9 F (36.6 C)] 97.5 F (36.4 C) (09/25 1530) Pulse Rate:  [66-71] 66 (09/25 1530) Cardiac Rhythm:  [-] Normal sinus rhythm (09/25 0900) Resp:  [18-20] 20 (09/25 1530) BP: (113-127)/(69-79) 122/69 mmHg (09/25 1530) SpO2:  [98 %-100 %] 100 % (09/25 1530) Weight:  [79.425 kg (175 lb 1.6 oz)] 79.425 kg (175 lb 1.6 oz) (09/25 0636)  Physical Exam: BP Readings from Last 1 Encounters:  04/04/14 122/69    Wt Readings from Last 1 Encounters:  04/04/14 79.425 kg (175 lb 1.6 oz)    Weight change: 0.816 kg (1 lb 12.8 oz)  HEENT: [place]/AT, Eyes-Brown, PERL, EOMI, Conjunctiva-Pink, Sclera-Non-icteric Neck: No JVD, No bruit, Trachea midline. Lungs:  Clearing, Bilateral. Cardiac:  Regular rhythm, normal S1 and S2, no S3.  Abdomen:  Soft, non-tender. Extremities:  2 + edema present. No cyanosis. No clubbing. CNS: AxOx3, Cranial nerves grossly intact, moves all 4 extremities. Right handed. Skin: Warm and dry.   Intake/Output from previous day: 09/24 0701 - 09/25 0700 In: 770 [P.O.:720; IV Piggyback:50] Out: 700 [Urine:700]    Lab Results: BMET    Component Value Date/Time   NA 141 04/04/2014 0439   NA 138 04/03/2014 0327   NA 141 04/02/2014 0843   K 4.0 04/04/2014 0439   K 3.9 04/03/2014 0327   K 3.8 04/02/2014 0843   CL 105 04/04/2014 0439   CL 102 04/03/2014 0327   CL 101 04/02/2014 0843   CO2 22 04/04/2014 0439   CO2 24 04/03/2014 0327   CO2 23 04/02/2014 0843   GLUCOSE 104* 04/04/2014 0439   GLUCOSE 126* 04/03/2014 0327   GLUCOSE 121* 04/02/2014 0843   BUN 34* 04/04/2014 0439   BUN 31* 04/03/2014 0327   BUN 28* 04/02/2014 0843   CREATININE 1.53* 04/04/2014 0439   CREATININE 1.37* 04/03/2014 0327   CREATININE 1.20* 04/02/2014 1710   CALCIUM 8.3* 04/04/2014 0439   CALCIUM 8.4 04/03/2014 0327   CALCIUM 9.5 04/02/2014 0843    GFRNONAA 35* 04/04/2014 0439   GFRNONAA 40* 04/03/2014 0327   GFRNONAA 47* 04/02/2014 1710   GFRAA 41* 04/04/2014 0439   GFRAA 47* 04/03/2014 0327   GFRAA 55* 04/02/2014 1710   CBC    Component Value Date/Time   WBC 4.4 04/02/2014 1710   RBC 5.20* 04/02/2014 1710   HGB 12.8 04/02/2014 1710   HCT 38.5 04/02/2014 1710   PLT 208 04/02/2014 1710   MCV 74.0* 04/02/2014 1710   MCH 24.6* 04/02/2014 1710   MCHC 33.2 04/02/2014 1710   RDW 18.3* 04/02/2014 1710   LYMPHSABS 0.9 02/03/2014 1239   MONOABS 0.3 02/03/2014 1239   EOSABS 0.0 02/03/2014 1239   BASOSABS 0.0 02/03/2014 1239   HEPATIC Function Panel  Recent Labs  07/21/13 0405 02/03/14 1239 04/04/14 0439  PROT 6.3 6.9 5.8*   HEMOGLOBIN A1C No components found with this basename: HGA1C,  MPG   CARDIAC ENZYMES Lab Results  Component Value Date   CKTOTAL 59 09/07/2012   CKMB 3.2 09/07/2012   TROPONINI <0.30 04/03/2014   TROPONINI <0.30 04/02/2014   TROPONINI <0.30 04/02/2014   BNP  Recent Labs  07/25/13 0535 02/03/14 1239 04/02/14 0843  PROBNP 6734.0* 10231.0* 11219.0*   TSH  Recent Labs  02/03/14 1239  TSH 1.990   CHOLESTEROL No results found for this basename: CHOL,  in the last 8760 hours  Scheduled Meds: . albumin human  12.5 g Intravenous Once  . allopurinol  100 mg Oral BID  . amLODipine  5 mg Oral BID  . aspirin EC  81 mg Oral Daily  . carvedilol  3.125 mg Oral BID WC  . cloNIDine  0.2 mg Oral QHS  . digoxin  0.125 mg Oral Daily  . furosemide  40 mg Intravenous BID  . heparin  5,000 Units Subcutaneous 3 times per day  . isosorbide-hydrALAZINE  1 tablet Oral TID  . potassium chloride  10 mEq Oral Daily  . sodium chloride  3 mL Intravenous Q12H  . spironolactone  25 mg Oral QHS   Continuous Infusions:  PRN Meds:.sodium chloride, acetaminophen, nitroGLYCERIN, ondansetron (ZOFRAN) IV, sodium chloride  Assessment/Plan: Acute Biventricular systolic heart failure  CAD  S/P Stent in LAD  DM, II  Hypertension   Anxiety  Increase Lasix dose.    LOS: 2 days    [name]  MD  04/04/2014, 7:43 PM

## 2014-04-04 NOTE — Progress Notes (Signed)
Patient sleeping peacefully. Denies c/o pain/dsicomfort at present time. Maintained on telemetry and is in SR with a LBB noted. Will continue to monitor.  Sharlene Dory, RN

## 2014-04-04 NOTE — Plan of Care (Signed)
Problem: Food- and Nutrition-Related Knowledge Deficit (NB-1.1) Goal: Nutrition education Formal process to instruct or train a patient/client in a skill or to impart knowledge to help patients/clients voluntarily manage or modify food choices and eating behavior to maintain or improve health. Outcome: Completed/Met Date Met:  04/04/14 Nutrition Education Note  RD consulted for nutrition education regarding new onset CHF.  RD provided "Low Sodium Nutrition Therapy" handout from the [employer]. Reviewed patient's dietary recall. Provided examples on ways to decrease sodium intake in diet. Discouraged intake of processed foods and use of salt shaker. Encouraged fresh fruits and vegetables as well as whole grain sources of carbohydrates to maximize fiber intake.   RD discussed why it is important for patient to adhere to diet recommendations, and emphasized the role of fluids, foods to avoid, and importance of weighing self daily. Teach back method used.  Expect good compliance.  Body mass index is 30.04 kg/(m^2). Pt meets criteria for obesity based on current BMI.  Current diet order is heart healthy, patient is consuming approximately 80% of meals at this time. Labs and medications reviewed. No further nutrition interventions warranted at this time. RD contact information provided. If additional nutrition issues arise, please re-consult RD.   Terrace Arabia RD, LDN

## 2014-04-05 LAB — BASIC METABOLIC PANEL
ANION GAP: 15 (ref 5–15)
BUN: 34 mg/dL — ABNORMAL HIGH (ref 6–23)
CHLORIDE: 102 meq/L (ref 96–112)
CO2: 23 mEq/L (ref 19–32)
Calcium: 8.8 mg/dL (ref 8.4–10.5)
Creatinine, Ser: 1.32 mg/dL — ABNORMAL HIGH (ref 0.50–1.10)
GFR calc non Af Amer: 42 mL/min — ABNORMAL LOW (ref >90)
GFR, EST AFRICAN AMERICAN: 49 mL/min — AB (ref >90)
Glucose, Bld: 113 mg/dL — ABNORMAL HIGH (ref 70–99)
POTASSIUM: 3.9 meq/L (ref 3.7–5.3)
Sodium: 140 mEq/L (ref 137–147)

## 2014-04-05 NOTE — Progress Notes (Signed)
Ref: KADAKIA,AJAY S, MD   Subjective:  Significant diuresis last night. Leg edema improving.  Objective:  Vital Signs in the last 24 hours: Temp:  [97.5 F (36.4 C)-98 F (36.7 C)] 98 F (36.7 C) (09/26 0500) Pulse Rate:  [64-66] 66 (09/26 0859) Cardiac Rhythm:  [-] Normal sinus rhythm (09/26 0829) Resp:  [19-20] 19 (09/26 0500) BP: (118-135)/(69-80) 135/72 mmHg (09/26 0859) SpO2:  [100 %] 100 % (09/26 0500) Weight:  [77.883 kg (171 lb 11.2 oz)] 77.883 kg (171 lb 11.2 oz) (09/26 0500)  Physical Exam: BP Readings from Last 1 Encounters:  04/05/14 135/72    Wt Readings from Last 1 Encounters:  04/05/14 77.883 kg (171 lb 11.2 oz)    Weight change: -1.542 kg (-3 lb 6.4 oz)  HEENT: [place]/AT, Eyes-Brown, PERL, EOMI, Conjunctiva-Pink, Sclera-Non-icteric Neck: No JVD, No bruit, Trachea midline. Lungs:  Clearing, Bilateral. Cardiac:  Regular rhythm, normal S1 and S2, no S3.  Abdomen:  Soft, non-tender. Extremities:  1 + edema present. No cyanosis. No clubbing. CNS: AxOx3, Cranial nerves grossly intact, moves all 4 extremities. Right handed. Skin: Warm and dry.   Intake/Output from previous day: 09/25 0701 - 09/26 0700 In: 840 [P.O.:840] Out: 3650 [Urine:3650]    Lab Results: BMET    Component Value Date/Time   NA 140 04/05/2014 0500   NA 141 04/04/2014 0439   NA 138 04/03/2014 0327   K 3.9 04/05/2014 0500   K 4.0 04/04/2014 0439   K 3.9 04/03/2014 0327   CL 102 04/05/2014 0500   CL 105 04/04/2014 0439   CL 102 04/03/2014 0327   CO2 23 04/05/2014 0500   CO2 22 04/04/2014 0439   CO2 24 04/03/2014 0327   GLUCOSE 113* 04/05/2014 0500   GLUCOSE 104* 04/04/2014 0439   GLUCOSE 126* 04/03/2014 0327   BUN 34* 04/05/2014 0500   BUN 34* 04/04/2014 0439   BUN 31* 04/03/2014 0327   CREATININE 1.32* 04/05/2014 0500   CREATININE 1.53* 04/04/2014 0439   CREATININE 1.37* 04/03/2014 0327   CALCIUM 8.8 04/05/2014 0500   CALCIUM 8.3* 04/04/2014 0439   CALCIUM 8.4 04/03/2014 0327   GFRNONAA 42*  04/05/2014 0500   GFRNONAA 35* 04/04/2014 0439   GFRNONAA 40* 04/03/2014 0327   GFRAA 49* 04/05/2014 0500   GFRAA 41* 04/04/2014 0439   GFRAA 47* 04/03/2014 0327   CBC    Component Value Date/Time   WBC 4.4 04/02/2014 1710   RBC 5.20* 04/02/2014 1710   HGB 12.8 04/02/2014 1710   HCT 38.5 04/02/2014 1710   PLT 208 04/02/2014 1710   MCV 74.0* 04/02/2014 1710   MCH 24.6* 04/02/2014 1710   MCHC 33.2 04/02/2014 1710   RDW 18.3* 04/02/2014 1710   LYMPHSABS 0.9 02/03/2014 1239   MONOABS 0.3 02/03/2014 1239   EOSABS 0.0 02/03/2014 1239   BASOSABS 0.0 02/03/2014 1239   HEPATIC Function Panel  Recent Labs  07/21/13 0405 02/03/14 1239 04/04/14 0439  PROT 6.3 6.9 5.8*   HEMOGLOBIN A1C No components found with this basename: HGA1C,  MPG   CARDIAC ENZYMES Lab Results  Component Value Date   CKTOTAL 59 09/07/2012   CKMB 3.2 09/07/2012   TROPONINI <0.30 04/03/2014   TROPONINI <0.30 04/02/2014   TROPONINI <0.30 04/02/2014   BNP  Recent Labs  07/25/13 0535 02/03/14 1239 04/02/14 0843  PROBNP 6734.0* 10231.0* 11219.0*   TSH  Recent Labs  02/03/14 1239  TSH 1.990   CHOLESTEROL No results found for this basename: CHOL,  in the  last 8760 hours  Scheduled Meds: . allopurinol  100 mg Oral BID  . amLODipine  5 mg Oral BID  . aspirin EC  81 mg Oral Daily  . carvedilol  3.125 mg Oral BID WC  . cloNIDine  0.2 mg Oral QHS  . digoxin  0.125 mg Oral Daily  . furosemide  80 mg Intravenous BID  . heparin  5,000 Units Subcutaneous 3 times per day  . potassium chloride  10 mEq Oral Daily  . sodium chloride  3 mL Intravenous Q12H  . spironolactone  25 mg Oral QHS   Continuous Infusions:  PRN Meds:.sodium chloride, acetaminophen, nitroGLYCERIN, ondansetron (ZOFRAN) IV, sodium chloride  Assessment/Plan: Acute Biventricular systolic heart failure  CAD  S/P Stent in LAD  DM, II  Hypertension  Anxiety  Continue medical treatment     LOS: 3 days    [name]  MD  04/05/2014, 10:11  AM

## 2014-04-05 NOTE — Progress Notes (Signed)
The patient did not have any complaints of pain and did not receive any PRN medications overnight.

## 2014-04-06 LAB — BASIC METABOLIC PANEL
ANION GAP: 14 (ref 5–15)
BUN: 32 mg/dL — ABNORMAL HIGH (ref 6–23)
CO2: 25 meq/L (ref 19–32)
Calcium: 9 mg/dL (ref 8.4–10.5)
Chloride: 101 mEq/L (ref 96–112)
Creatinine, Ser: 1.26 mg/dL — ABNORMAL HIGH (ref 0.50–1.10)
GFR calc Af Amer: 52 mL/min — ABNORMAL LOW (ref >90)
GFR calc non Af Amer: 45 mL/min — ABNORMAL LOW (ref >90)
Glucose, Bld: 96 mg/dL (ref 70–99)
Potassium: 4.2 mEq/L (ref 3.7–5.3)
SODIUM: 140 meq/L (ref 137–147)

## 2014-04-06 NOTE — Progress Notes (Signed)
Ref: [name], MD   Subjective:  Additional 2 litre diuresis with 2 pound weight loss in 24 hours.  Objective:  Vital Signs in the last 24 hours: Temp:  [97.9 F (36.6 C)-98.3 F (36.8 C)] 98.2 F (36.8 C) (09/27 0608) Pulse Rate:  [66-77] 69 (09/27 0829) Cardiac Rhythm:  [-] Normal sinus rhythm (09/26 2100) Resp:  [17-20] 20 (09/27 0608) BP: (108-149)/(56-86) 139/60 mmHg (09/27 0829) SpO2:  [96 %-100 %] 96 % (09/27 0608) Weight:  [75.9 kg (167 lb 5.3 oz)] 75.9 kg (167 lb 5.3 oz) (09/27 1610)  Physical Exam: BP Readings from Last 1 Encounters:  04/06/14 139/60    Wt Readings from Last 1 Encounters:  04/06/14 75.9 kg (167 lb 5.3 oz)    Weight change: -1.983 kg (-4 lb 5.9 oz)  HEENT: [place]/AT, Eyes-Brown, PERL, EOMI, Conjunctiva-Pink, Sclera-Non-icteric Neck: No JVD, No bruit, Trachea midline. Lungs:  Clearing, Bilateral. Cardiac:  Regular rhythm, normal S1 and S2, no S3.  Abdomen:  Soft, non-tender. Extremities:  1 + edema present. No cyanosis. No clubbing. CNS: AxOx3, Cranial nerves grossly intact, moves all 4 extremities. Right handed. Skin: Warm and dry.   Intake/Output from previous day: 09/26 0701 - 09/27 0700 In: 740 [P.O.:740] Out: 3275 [Urine:3275]    Lab Results: BMET    Component Value Date/Time   NA 140 04/06/2014 0415   NA 140 04/05/2014 0500   NA 141 04/04/2014 0439   K 4.2 04/06/2014 0415   K 3.9 04/05/2014 0500   K 4.0 04/04/2014 0439   CL 101 04/06/2014 0415   CL 102 04/05/2014 0500   CL 105 04/04/2014 0439   CO2 25 04/06/2014 0415   CO2 23 04/05/2014 0500   CO2 22 04/04/2014 0439   GLUCOSE 96 04/06/2014 0415   GLUCOSE 113* 04/05/2014 0500   GLUCOSE 104* 04/04/2014 0439   BUN 32* 04/06/2014 0415   BUN 34* 04/05/2014 0500   BUN 34* 04/04/2014 0439   CREATININE 1.26* 04/06/2014 0415   CREATININE 1.32* 04/05/2014 0500   CREATININE 1.53* 04/04/2014 0439   CALCIUM 9.0 04/06/2014 0415   CALCIUM 8.8 04/05/2014 0500   CALCIUM 8.3* 04/04/2014 0439   GFRNONAA 45*  04/06/2014 0415   GFRNONAA 42* 04/05/2014 0500   GFRNONAA 35* 04/04/2014 0439   GFRAA 52* 04/06/2014 0415   GFRAA 49* 04/05/2014 0500   GFRAA 41* 04/04/2014 0439   CBC    Component Value Date/Time   WBC 4.4 04/02/2014 1710   RBC 5.20* 04/02/2014 1710   HGB 12.8 04/02/2014 1710   HCT 38.5 04/02/2014 1710   PLT 208 04/02/2014 1710   MCV 74.0* 04/02/2014 1710   MCH 24.6* 04/02/2014 1710   MCHC 33.2 04/02/2014 1710   RDW 18.3* 04/02/2014 1710   LYMPHSABS 0.9 02/03/2014 1239   MONOABS 0.3 02/03/2014 1239   EOSABS 0.0 02/03/2014 1239   BASOSABS 0.0 02/03/2014 1239   HEPATIC Function Panel  Recent Labs  07/21/13 0405 02/03/14 1239 04/04/14 0439  PROT 6.3 6.9 5.8*   HEMOGLOBIN A1C No components found with this basename: HGA1C,  MPG   CARDIAC ENZYMES Lab Results  Component Value Date   CKTOTAL 59 09/07/2012   CKMB 3.2 09/07/2012   TROPONINI <0.30 04/03/2014   TROPONINI <0.30 04/02/2014   TROPONINI <0.30 04/02/2014   BNP  Recent Labs  07/25/13 0535 02/03/14 1239 04/02/14 0843  PROBNP 6734.0* 10231.0* 11219.0*   TSH  Recent Labs  02/03/14 1239  TSH 1.990   CHOLESTEROL No results found for  this basename: CHOL,  in the last 8760 hours  Scheduled Meds: . allopurinol  100 mg Oral BID  . amLODipine  5 mg Oral BID  . aspirin EC  81 mg Oral Daily  . carvedilol  3.125 mg Oral BID WC  . cloNIDine  0.2 mg Oral QHS  . digoxin  0.125 mg Oral Daily  . furosemide  80 mg Intravenous BID  . heparin  5,000 Units Subcutaneous 3 times per day  . potassium chloride  10 mEq Oral Daily  . sodium chloride  3 mL Intravenous Q12H  . spironolactone  25 mg Oral QHS   Continuous Infusions:  PRN Meds:.sodium chloride, acetaminophen, nitroGLYCERIN, ondansetron (ZOFRAN) IV, sodium chloride  Assessment/Plan: Acute Biventricular systolic heart failure  CAD  S/P Stent in LAD  DM, II  Hypertension  Anxiety   Continue medical treatment     LOS: 4 days    [name]  MD  04/06/2014, 9:56  AM

## 2014-04-07 NOTE — Plan of Care (Signed)
Problem: Phase I Progression Outcomes Goal: EF % per last Echo/documented,Core Reminder form on chart Outcome: Completed/Met Date Met:  04/07/14 EF 20-25%(02-03-14)

## 2014-04-08 NOTE — Progress Notes (Signed)
Ref: [name],Tanya Broerman S, MD   Subjective:  Additional diuresis. Decreasing leg edema.  Objective:  Vital Signs in the last 24 hours: Temp:  [97.6 F (36.4 C)-97.9 F (36.6 C)] 97.6 F (36.4 C) (09/29 0900) Pulse Rate:  [58-64] 58 (09/29 1517) Cardiac Rhythm:  [-] Normal sinus rhythm (09/29 0801) Resp:  [18-20] 20 (09/29 1517) BP: (133-149)/(61-75) 133/62 mmHg (09/29 1517) SpO2:  [97 %-100 %] 100 % (09/29 1517) Weight:  [71.033 kg (156 lb 9.6 oz)] 71.033 kg (156 lb 9.6 oz) (09/29 [id number])  Physical Exam: BP Readings from Last 1 Encounters:  04/08/14 133/62    Wt Readings from Last 1 Encounters:  04/08/14 71.033 kg (156 lb 9.6 oz)    Weight change: -2.467 kg (-5 lb 7 oz)  HEENT: [place]/AT, Eyes-Brown, PERL, EOMI, Conjunctiva-Pink, Sclera-Non-icteric Neck: No JVD, No bruit, Trachea midline. Lungs:  Clearing, Bilateral. Cardiac:  Regular rhythm, normal S1 and S2, no S3.  Abdomen:  Soft, non-tender. Extremities:  1 + below the knee edema present. No cyanosis. No clubbing. CNS: AxOx3, Cranial nerves grossly intact, moves all 4 extremities. Right handed. Skin: Warm and dry.   Intake/Output from previous day: 09/28 0701 - 09/29 0700 In: 960 [P.O.:960] Out: 5550 [Urine:5550]    Lab Results: BMET    Component Value Date/Time   NA 140 04/06/2014 0415   NA 140 04/05/2014 0500   NA 141 04/04/2014 0439   K 4.2 04/06/2014 0415   K 3.9 04/05/2014 0500   K 4.0 04/04/2014 0439   CL 101 04/06/2014 0415   CL 102 04/05/2014 0500   CL 105 04/04/2014 0439   CO2 25 04/06/2014 0415   CO2 23 04/05/2014 0500   CO2 22 04/04/2014 0439   GLUCOSE 96 04/06/2014 0415   GLUCOSE 113* 04/05/2014 0500   GLUCOSE 104* 04/04/2014 0439   BUN 32* 04/06/2014 0415   BUN 34* 04/05/2014 0500   BUN 34* 04/04/2014 0439   CREATININE 1.26* 04/06/2014 0415   CREATININE 1.32* 04/05/2014 0500   CREATININE 1.53* 04/04/2014 0439   CALCIUM 9.0 04/06/2014 0415   CALCIUM 8.8 04/05/2014 0500   CALCIUM 8.3* 04/04/2014 0439   GFRNONAA 45*  04/06/2014 0415   GFRNONAA 42* 04/05/2014 0500   GFRNONAA 35* 04/04/2014 0439   GFRAA 52* 04/06/2014 0415   GFRAA 49* 04/05/2014 0500   GFRAA 41* 04/04/2014 0439   CBC    Component Value Date/Time   WBC 4.4 04/02/2014 1710   RBC 5.20* 04/02/2014 1710   HGB 12.8 04/02/2014 1710   HCT 38.5 04/02/2014 1710   PLT 208 04/02/2014 1710   MCV 74.0* 04/02/2014 1710   MCH 24.6* 04/02/2014 1710   MCHC 33.2 04/02/2014 1710   RDW 18.3* 04/02/2014 1710   LYMPHSABS 0.9 02/03/2014 1239   MONOABS 0.3 02/03/2014 1239   EOSABS 0.0 02/03/2014 1239   BASOSABS 0.0 02/03/2014 1239   HEPATIC Function Panel  Recent Labs  07/21/13 0405 02/03/14 1239 04/04/14 0439  PROT 6.3 6.9 5.8*   HEMOGLOBIN A1C No components found with this basename: HGA1C,  MPG   CARDIAC ENZYMES Lab Results  Component Value Date   CKTOTAL 59 09/07/2012   CKMB 3.2 09/07/2012   TROPONINI <0.30 04/03/2014   TROPONINI <0.30 04/02/2014   TROPONINI <0.30 04/02/2014   BNP  Recent Labs  07/25/13 0535 02/03/14 1239 04/02/14 0843  PROBNP 6734.0* 10231.0* 11219.0*   TSH  Recent Labs  02/03/14 1239  TSH 1.990   CHOLESTEROL No results found for this basename: CHOL,  in the last 8760 hours  Scheduled Meds: . allopurinol  100 mg Oral BID  . amLODipine  5 mg Oral BID  . aspirin EC  81 mg Oral Daily  . carvedilol  3.125 mg Oral BID WC  . cloNIDine  0.2 mg Oral QHS  . digoxin  0.125 mg Oral Daily  . furosemide  80 mg Intravenous BID  . potassium chloride  10 mEq Oral Daily  . sodium chloride  3 mL Intravenous Q12H  . spironolactone  25 mg Oral QHS   Continuous Infusions:  PRN Meds:.sodium chloride, acetaminophen, nitroGLYCERIN, ondansetron (ZOFRAN) IV, sodium chloride  Assessment/Plan: Acute Biventricular systolic heart failure  CAD  [name]  DM, II  Hypertension  Anxiety  Continue diuresis. Recheck Pro-BNP in AM. Home soon    LOS: 6 days    [name]  MD  04/08/2014, 6:22 PM

## 2014-04-08 NOTE — Progress Notes (Signed)
Late Entry Ref: [name],Elliona Doddridge S, MD   Subjective:  Feeling better. Good diuresis  Objective:  Vital Signs in the last 24 hours: Temp:  [97.6 F (36.4 C)-97.9 F (36.6 C)] 97.6 F (36.4 C) (09/29 0900) Pulse Rate:  [58-64] 58 (09/29 1517) Cardiac Rhythm:  [-] Normal sinus rhythm (09/29 0801) Resp:  [18-20] 20 (09/29 1517) BP: (133-149)/(61-75) 133/62 mmHg (09/29 1517) SpO2:  [97 %-100 %] 100 % (09/29 1517) Weight:  [71.033 kg (156 lb 9.6 oz)] 71.033 kg (156 lb 9.6 oz) (09/29 [id number])  Physical Exam: BP Readings from Last 1 Encounters:  04/08/14 133/62    Wt Readings from Last 1 Encounters:  04/08/14 71.033 kg (156 lb 9.6 oz)    Weight change: -2.467 kg (-5 lb 7 oz)  HEENT: /AT, Eyes-Brown, PERL, EOMI, Conjunctiva-Pink, Sclera-Non-icteric Neck: No JVD, No bruit, Trachea midline. Lungs:  Clearing, Bilateral. Cardiac:  Regular rhythm, normal S1 and S2, no S3.  Abdomen:  Soft, non-tender. Extremities:  1+ edema present. No cyanosis. No clubbing. CNS: AxOx3, Cranial nerves grossly intact, moves all 4 extremities. Right handed. Skin: Warm and dry.   Intake/Output from previous day: 09/28 0701 - 09/29 0700 In: 960 [P.O.:960] Out: 5550 [Urine:5550]    Lab Results: BMET    Component Value Date/Time   NA 140 04/06/2014 0415   NA 140 04/05/2014 0500   NA 141 04/04/2014 0439   K 4.2 04/06/2014 0415   K 3.9 04/05/2014 0500   K 4.0 04/04/2014 0439   CL 101 04/06/2014 0415   CL 102 04/05/2014 0500   CL 105 04/04/2014 0439   CO2 25 04/06/2014 0415   CO2 23 04/05/2014 0500   CO2 22 04/04/2014 0439   GLUCOSE 96 04/06/2014 0415   GLUCOSE 113* 04/05/2014 0500   GLUCOSE 104* 04/04/2014 0439   BUN 32* 04/06/2014 0415   BUN 34* 04/05/2014 0500   BUN 34* 04/04/2014 0439   CREATININE 1.26* 04/06/2014 0415   CREATININE 1.32* 04/05/2014 0500   CREATININE 1.53* 04/04/2014 0439   CALCIUM 9.0 04/06/2014 0415   CALCIUM 8.8 04/05/2014 0500   CALCIUM 8.3* 04/04/2014 0439   GFRNONAA 45* 04/06/2014 0415   GFRNONAA 42* 04/05/2014 0500   GFRNONAA 35* 04/04/2014 0439   GFRAA 52* 04/06/2014 0415   GFRAA 49* 04/05/2014 0500   GFRAA 41* 04/04/2014 0439   CBC    Component Value Date/Time   WBC 4.4 04/02/2014 1710   RBC 5.20* 04/02/2014 1710   HGB 12.8 04/02/2014 1710   HCT 38.5 04/02/2014 1710   PLT 208 04/02/2014 1710   MCV 74.0* 04/02/2014 1710   MCH 24.6* 04/02/2014 1710   MCHC 33.2 04/02/2014 1710   RDW 18.3* 04/02/2014 1710   LYMPHSABS 0.9 02/03/2014 1239   MONOABS 0.3 02/03/2014 1239   EOSABS 0.0 02/03/2014 1239   BASOSABS 0.0 02/03/2014 1239   HEPATIC Function Panel  Recent Labs  07/21/13 0405 02/03/14 1239 04/04/14 0439  PROT 6.3 6.9 5.8*   HEMOGLOBIN A1C No components found with this basename: HGA1C,  MPG   CARDIAC ENZYMES Lab Results  Component Value Date   CKTOTAL 59 09/07/2012   CKMB 3.2 09/07/2012   TROPONINI <0.30 04/03/2014   TROPONINI <0.30 04/02/2014   TROPONINI <0.30 04/02/2014   BNP  Recent Labs  07/25/13 0535 02/03/14 1239 04/02/14 0843  PROBNP 6734.0* 10231.0* 11219.0*   TSH  Recent Labs  02/03/14 1239  TSH 1.990   CHOLESTEROL No results found for this basename: CHOL,  in the last 8760  hours  Scheduled Meds: . allopurinol  100 mg Oral BID  . amLODipine  5 mg Oral BID  . aspirin EC  81 mg Oral Daily  . carvedilol  3.125 mg Oral BID WC  . cloNIDine  0.2 mg Oral QHS  . digoxin  0.125 mg Oral Daily  . furosemide  80 mg Intravenous BID  . potassium chloride  10 mEq Oral Daily  . sodium chloride  3 mL Intravenous Q12H  . spironolactone  25 mg Oral QHS   Continuous Infusions:  PRN Meds:.sodium chloride, acetaminophen, nitroGLYCERIN, ondansetron (ZOFRAN) IV, sodium chloride  Assessment/Plan: Acute Biventricular systolic heart failure  CAD  S/P Stent in LAD  DM, II  Hypertension  Anxiety  Continue diuresis    LOS: 6 days    [name]  MD  04/08/2014, 6:20 PM

## 2014-04-09 LAB — BASIC METABOLIC PANEL
Anion gap: 14 (ref 5–15)
BUN: 34 mg/dL — ABNORMAL HIGH (ref 6–23)
CO2: 27 mEq/L (ref 19–32)
CREATININE: 1.12 mg/dL — AB (ref 0.50–1.10)
Calcium: 9 mg/dL (ref 8.4–10.5)
Chloride: 98 mEq/L (ref 96–112)
GFR, EST AFRICAN AMERICAN: 60 mL/min — AB (ref >90)
GFR, EST NON AFRICAN AMERICAN: 52 mL/min — AB (ref >90)
Glucose, Bld: 121 mg/dL — ABNORMAL HIGH (ref 70–99)
Potassium: 4 mEq/L (ref 3.7–5.3)
SODIUM: 139 meq/L (ref 137–147)

## 2014-04-09 LAB — CBC
HEMATOCRIT: 38.9 % (ref 36.0–46.0)
Hemoglobin: 12.7 g/dL (ref 12.0–15.0)
MCH: 23.9 pg — ABNORMAL LOW (ref 26.0–34.0)
MCHC: 32.6 g/dL (ref 30.0–36.0)
MCV: 73.1 fL — ABNORMAL LOW (ref 78.0–100.0)
PLATELETS: 233 10*3/uL (ref 150–400)
RBC: 5.32 MIL/uL — ABNORMAL HIGH (ref 3.87–5.11)
RDW: 18.4 % — AB (ref 11.5–15.5)
WBC: 3.6 10*3/uL — AB (ref 4.0–10.5)

## 2014-04-09 LAB — PRO B NATRIURETIC PEPTIDE: PRO B NATRI PEPTIDE: 8206 pg/mL — AB (ref 0–125)

## 2014-04-09 MED ORDER — DIGOXIN 125 MCG PO TABS: 0.0625 mg | ORAL_TABLET | Freq: Every day | ORAL | Status: DC

## 2014-04-09 NOTE — Discharge Summary (Signed)
Physician Discharge Summary  Patient ID: Tanya SpikeMargaret A Staggs MRN: [id number] DOB/AGE: 14-Jul-1950 63 y.o.  Admit date: 04/02/2014 Discharge date: 04/09/2014  Admission Diagnoses: Acute Left systolic heart failure  CAD  S/P Stent in LAD  DM, II  Hypertension  Anxiety  Discharge Diagnoses:  Principle Problem: * Acute Biventricular systolic heart failure * CAD  S/P Stent in LAD  DM, II  Hypertension  Anxiety  Discharged Condition: fair  Hospital Course: 63 year old female had leg edema and progressive worsening of shortness of breath x 1 month. She had chest pain, fever or cough. She had significant diuresis with IV lasix use and lost about 10 pounds of weight. Her BNP improved from 11219 to 8206. Her leg edema improved from 2+ thigh edema to 1 + below the knee edema and she was able to ambulate well without shortness of breath.  Consults: cardiology  Significant Diagnostic Studies: labs: Near normal CBC and BMET and Troponin-I. ProBNP was elevated at 11219 and week later 8206 pg/ml.   Chest x-ray on admission showed: Enlargement of cardiac silhouette with pulmonary vascular congestion. Small pleural effusion with bibasilar atelectasis greater on LEFT.  EKG-Sinus rhythm and non-specific T wave changes.  Treatments: cardiac meds: carvedilol, amlodipine, furosemide, potassium and spironolactone.  Discharge Exam: Blood pressure 136/71, pulse 60, temperature 97.5 F (36.4 C), temperature source Oral, resp. rate 18, height 5\' 4"  (1.626 m), weight 69.854 kg (154 lb), SpO2 97.00%. HEENT: [place]/AT, Eyes-Brown, PERL, EOMI, Conjunctiva-Pink, Sclera-Non-icteric  Neck: No JVD, No bruit, Trachea midline.  Lungs: Clearing, Bilateral.  Cardiac: Regular rhythm, normal S1 and S2, no S3.  Abdomen: Soft, non-tender.  Extremities: 1 + below the knee edema present. No cyanosis. No clubbing.  CNS: AxOx3, Cranial nerves grossly intact, moves all 4 extremities. Right handed.  Skin: Warm and  dry.  Disposition: 01-Home or Self Care     Medication List    STOP taking these medications       isosorbide-hydrALAZINE 20-37.5 MG per tablet  Commonly known as:  BIDIL      TAKE these medications       allopurinol 100 MG tablet  Commonly known as:  ZYLOPRIM  Take 100 mg by mouth 2 (two) times daily.     amLODipine 5 MG tablet  Commonly known as:  NORVASC  Take 5 mg by mouth 2 (two) times daily.     aspirin 81 MG EC tablet  Take 1 tablet (81 mg total) by mouth daily.     carvedilol 3.125 MG tablet  Commonly known as:  COREG  Take 1 tablet (3.125 mg total) by mouth 2 (two) times daily with a meal.     cloNIDine 0.2 MG tablet  Commonly known as:  CATAPRES  Take 0.2 mg by mouth at bedtime.     digoxin 0.125 MG tablet  Commonly known as:  LANOXIN  Take 0.5 tablets (0.0625 mg total) by mouth daily.     furosemide 40 MG tablet  Commonly known as:  LASIX  Take 80 mg by mouth 2 (two) times daily.     nitroGLYCERIN 0.4 MG SL tablet  Commonly known as:  NITROSTAT  Place 0.4 mg under the tongue every 5 (five) minutes as needed for chest pain.     potassium chloride 10 MEQ tablet  Commonly known as:  K-DUR  Take 10 mEq by mouth daily as needed (when she starts to feel "sluggish"). 11/21/11 last dose.     spironolactone 25 MG tablet  Commonly known  as:  ALDACTONE  Take 25 mg by mouth at bedtime.           Follow-up Information   Follow up with [facility] S, MD. Schedule an appointment as soon as possible for a visit in 1 week.   Specialty:  Cardiology   Contact information:   [address] [place] [address] [phone]       Signed: [name] 04/09/2014, 8:51 AM

## 2014-04-09 NOTE — Discharge Instructions (Signed)

## 2014-06-19 ENCOUNTER — Inpatient Hospital Stay (HOSPITAL_COMMUNITY)
Admission: AD | Admit: 2014-06-19 | Discharge: 2014-06-28 | DRG: 293 | Disposition: A | Discharge status: Home / Self Care | Payer: No Typology Code available for payment source | Source: Ambulatory Visit | Attending: Cardiovascular Disease | Admitting: Cardiovascular Disease

## 2014-06-19 ENCOUNTER — Encounter (HOSPITAL_COMMUNITY): Payer: Self-pay | Admitting: Cardiology

## 2014-06-19 DIAGNOSIS — I10 Essential (primary) hypertension: Secondary | ICD-10-CM | POA: Diagnosis present

## 2014-06-19 DIAGNOSIS — F419 Anxiety disorder, unspecified: Secondary | ICD-10-CM | POA: Diagnosis present

## 2014-06-19 DIAGNOSIS — Z79899 Other long term (current) drug therapy: Secondary | ICD-10-CM | POA: Diagnosis not present

## 2014-06-19 DIAGNOSIS — I5021 Acute systolic (congestive) heart failure: Principal | ICD-10-CM | POA: Diagnosis present

## 2014-06-19 DIAGNOSIS — Z955 Presence of coronary angioplasty implant and graft: Secondary | ICD-10-CM | POA: Diagnosis not present

## 2014-06-19 DIAGNOSIS — Z7982 Long term (current) use of aspirin: Secondary | ICD-10-CM | POA: Diagnosis not present

## 2014-06-19 DIAGNOSIS — I2510 Atherosclerotic heart disease of native coronary artery without angina pectoris: Secondary | ICD-10-CM | POA: Diagnosis present

## 2014-06-19 DIAGNOSIS — I5041 Acute combined systolic (congestive) and diastolic (congestive) heart failure: Secondary | ICD-10-CM | POA: Diagnosis present

## 2014-06-19 DIAGNOSIS — E119 Type 2 diabetes mellitus without complications: Secondary | ICD-10-CM | POA: Diagnosis present

## 2014-06-19 DIAGNOSIS — R609 Edema, unspecified: Secondary | ICD-10-CM | POA: Diagnosis present

## 2014-06-19 LAB — COMPREHENSIVE METABOLIC PANEL
ALBUMIN: 2.7 g/dL — AB (ref 3.5–5.2)
ALT: 10 U/L (ref 0–35)
AST: 19 U/L (ref 0–37)
Alkaline Phosphatase: 97 U/L (ref 39–117)
Anion gap: 20 — ABNORMAL HIGH (ref 5–15)
BUN: 39 mg/dL — ABNORMAL HIGH (ref 6–23)
CALCIUM: 8.9 mg/dL (ref 8.4–10.5)
CO2: 18 mEq/L — ABNORMAL LOW (ref 19–32)
Chloride: 100 mEq/L (ref 96–112)
Creatinine, Ser: 1.22 mg/dL — ABNORMAL HIGH (ref 0.50–1.10)
GFR calc Af Amer: 54 mL/min — ABNORMAL LOW (ref >90)
GFR, EST NON AFRICAN AMERICAN: 46 mL/min — AB (ref >90)
Glucose, Bld: 158 mg/dL — ABNORMAL HIGH (ref 70–99)
Potassium: 4.2 mEq/L (ref 3.7–5.3)
SODIUM: 138 meq/L (ref 137–147)
Total Bilirubin: 1.4 mg/dL — ABNORMAL HIGH (ref 0.3–1.2)
Total Protein: 6.7 g/dL (ref 6.0–8.3)

## 2014-06-19 LAB — CBC WITH DIFFERENTIAL/PLATELET
BASOS ABS: 0 10*3/uL (ref 0.0–0.1)
Basophils Relative: 0 % (ref 0–1)
EOS ABS: 0 10*3/uL (ref 0.0–0.7)
Eosinophils Relative: 1 % (ref 0–5)
HCT: 42 % (ref 36.0–46.0)
Hemoglobin: 13.4 g/dL (ref 12.0–15.0)
Lymphocytes Relative: 21 % (ref 12–46)
Lymphs Abs: 1.1 10*3/uL (ref 0.7–4.0)
MCH: 25 pg — ABNORMAL LOW (ref 26.0–34.0)
MCHC: 31.9 g/dL (ref 30.0–36.0)
MCV: 78.4 fL (ref 78.0–100.0)
MONO ABS: 0.4 10*3/uL (ref 0.1–1.0)
Monocytes Relative: 7 % (ref 3–12)
Neutro Abs: 3.9 10*3/uL (ref 1.7–7.7)
Neutrophils Relative %: 71 % (ref 43–77)
PLATELETS: 246 10*3/uL (ref 150–400)
RBC: 5.36 MIL/uL — ABNORMAL HIGH (ref 3.87–5.11)
RDW: 19.1 % — AB (ref 11.5–15.5)
WBC: 5.4 10*3/uL (ref 4.0–10.5)

## 2014-06-19 LAB — APTT: aPTT: 33 seconds (ref 24–37)

## 2014-06-19 LAB — TROPONIN I

## 2014-06-19 LAB — PRO B NATRIURETIC PEPTIDE: PRO B NATRI PEPTIDE: 14027 pg/mL — AB (ref 0–125)

## 2014-06-19 LAB — PROTIME-INR
INR: 1.43 (ref 0.00–1.49)
PROTHROMBIN TIME: 17.6 s — AB (ref 11.6–15.2)

## 2014-06-19 MED ORDER — DIGOXIN 0.0625 MG HALF TABLET
0.0625 mg | ORAL_TABLET | Freq: Every day | ORAL | Status: DC
  Administered 2014-06-19 – 2014-06-20 (×2): 0.0625 mg via ORAL
  Filled 2014-06-19 (×3): qty 1

## 2014-06-19 MED ORDER — ACETAMINOPHEN 325 MG PO TABS
650.0000 mg | ORAL_TABLET | ORAL | Status: DC | PRN
  Filled 2014-06-19: qty 2

## 2014-06-19 MED ORDER — ASPIRIN EC 81 MG PO TBEC
81.0000 mg | DELAYED_RELEASE_TABLET | Freq: Every day | ORAL | Status: DC
  Administered 2014-06-19 – 2014-06-28 (×10): 81 mg via ORAL
  Filled 2014-06-19 (×10): qty 1

## 2014-06-19 MED ORDER — OXYCODONE-ACETAMINOPHEN 5-325 MG PO TABS
1.0000 | ORAL_TABLET | Freq: Four times a day (QID) | ORAL | Status: DC | PRN
  Administered 2014-06-19: 1 via ORAL
  Filled 2014-06-19 (×2): qty 1

## 2014-06-19 MED ORDER — SPIRONOLACTONE 25 MG PO TABS
25.0000 mg | ORAL_TABLET | Freq: Every day | ORAL | Status: DC
  Administered 2014-06-19 – 2014-06-27 (×9): 25 mg via ORAL
  Filled 2014-06-19 (×10): qty 1

## 2014-06-19 MED ORDER — CARVEDILOL 3.125 MG PO TABS
3.1250 mg | ORAL_TABLET | Freq: Two times a day (BID) | ORAL | Status: DC
  Administered 2014-06-19 – 2014-06-28 (×18): 3.125 mg via ORAL
  Filled 2014-06-19 (×20): qty 1

## 2014-06-19 MED ORDER — ALLOPURINOL 100 MG PO TABS
100.0000 mg | ORAL_TABLET | Freq: Two times a day (BID) | ORAL | Status: DC
  Administered 2014-06-19 – 2014-06-28 (×18): 100 mg via ORAL
  Filled 2014-06-19 (×19): qty 1

## 2014-06-19 MED ORDER — SODIUM CHLORIDE 0.9 % IV SOLN
250.0000 mL | INTRAVENOUS | Status: DC | PRN
  Administered 2014-06-23 – 2014-06-24 (×2): 250 mL via INTRAVENOUS

## 2014-06-19 MED ORDER — CLONIDINE HCL 0.1 MG PO TABS
0.1000 mg | ORAL_TABLET | Freq: Every day | ORAL | Status: DC
  Administered 2014-06-19 – 2014-06-27 (×9): 0.1 mg via ORAL
  Filled 2014-06-19 (×10): qty 1

## 2014-06-19 MED ORDER — ONDANSETRON HCL 4 MG/2ML IJ SOLN
4.0000 mg | Freq: Four times a day (QID) | INTRAMUSCULAR | Status: DC | PRN
  Administered 2014-06-23: 4 mg via INTRAVENOUS
  Filled 2014-06-19 (×2): qty 2

## 2014-06-19 MED ORDER — FUROSEMIDE 10 MG/ML IJ SOLN
40.0000 mg | Freq: Two times a day (BID) | INTRAMUSCULAR | Status: DC
  Administered 2014-06-19 – 2014-06-20 (×2): 40 mg via INTRAVENOUS
  Filled 2014-06-19 (×4): qty 4

## 2014-06-19 MED ORDER — AMLODIPINE BESYLATE 5 MG PO TABS
5.0000 mg | ORAL_TABLET | Freq: Two times a day (BID) | ORAL | Status: DC
  Administered 2014-06-19 – 2014-06-28 (×18): 5 mg via ORAL
  Filled 2014-06-19 (×19): qty 1

## 2014-06-19 MED ORDER — POTASSIUM CHLORIDE ER 10 MEQ PO TBCR
10.0000 meq | EXTENDED_RELEASE_TABLET | Freq: Every day | ORAL | Status: DC | PRN
  Filled 2014-06-19: qty 1

## 2014-06-19 MED ORDER — SODIUM CHLORIDE 0.9 % IJ SOLN
3.0000 mL | Freq: Two times a day (BID) | INTRAMUSCULAR | Status: DC
  Administered 2014-06-19 – 2014-06-27 (×13): 3 mL via INTRAVENOUS

## 2014-06-19 MED ORDER — HEPARIN SODIUM (PORCINE) 5000 UNIT/ML IJ SOLN
5000.0000 [IU] | Freq: Three times a day (TID) | INTRAMUSCULAR | Status: DC
  Administered 2014-06-25: 5000 [IU] via SUBCUTANEOUS
  Filled 2014-06-19 (×29): qty 1

## 2014-06-19 MED ORDER — NITROGLYCERIN 0.4 MG SL SUBL: 0.4000 mg | SUBLINGUAL_TABLET | SUBLINGUAL | Status: DC | PRN

## 2014-06-19 MED ORDER — SODIUM CHLORIDE 0.9 % IJ SOLN: 3.0000 mL | INTRAMUSCULAR | Status: DC | PRN

## 2014-06-19 NOTE — Progress Notes (Signed)
Echocardiogram 2D Echocardiogram has been performed.  [name], [name] 06/19/2014, 3:37 PM

## 2014-06-19 NOTE — H&P (Signed)
Referring Physician:  [name] is an 63 y.o. female.                       Chief Complaint: Shortness of breath.  HPI: 63 year old female with leg edema and progressive worsening of shortness of breath x 1 month and more so in last one week with 20 + pound weight gain in 3 months. No chest pain, fever or cough.   Past Medical History  Diagnosis Date  . Hypertension   . Diabetes mellitus   . Heart failure   . Coronary artery disease   . Stented coronary artery   . CHF (congestive heart failure)       Past Surgical History  Procedure Laterality Date  . Left and right heart catheterization with coronary angiogram N/A 11/25/2011    Procedure: LEFT AND RIGHT HEART CATHETERIZATION WITH CORONARY ANGIOGRAM;  Surgeon: Robynn PaneMohan N Harwani, MD;  Location: [facility] CATH LAB;  Service: Cardiovascular;  Laterality: N/A;  . Percutaneous coronary stent intervention (pci-[name]) Right 11/25/2011    Procedure: PERCUTANEOUS CORONARY STENT INTERVENTION (PCI-[name]);  Surgeon: Robynn PaneMohan N Harwani, MD;  [facility];  Service: Cardiovascular;  Laterality: Right;  . Left and right heart catheterization with coronary angiogram N/A 09/07/2012    Procedure: LEFT AND RIGHT HEART CATHETERIZATION WITH CORONARY ANGIOGRAM;  Surgeon: Ricki RodriguezAjay [name] Shandiin Eisenbeis, MD;  Location: [facility] CATH LAB;  Service: Cardiovascular;  Laterality: N/A;    No family history on file. Social History:  reports that she has never smoked. She does not have any smokeless tobacco history on file. She reports that she does not drink alcohol or use illicit drugs.  Allergies: No Known Allergies  Medications Prior to Admission  Medication Sig Dispense Refill  . allopurinol (ZYLOPRIM) 100 MG tablet Take 100 mg by mouth 2 (two) times daily.    Marland Kitchen. amLODipine (NORVASC) 5 MG tablet Take 5 mg by mouth 2 (two) times daily.    Marland Kitchen. aspirin EC 81 MG EC tablet Take 1 tablet (81 mg total) by mouth daily.    . carvedilol (COREG) 3.125 MG tablet Take 1 tablet (3.125 mg total) by  mouth 2 (two) times daily with a meal. 60 tablet 6  . cloNIDine (CATAPRES) 0.2 MG tablet Take 0.1 mg by mouth at bedtime.     . digoxin (LANOXIN) 0.125 MG tablet Take 0.5 tablets (0.0625 mg total) by mouth daily. 15 tablet 1  . furosemide (LASIX) 40 MG tablet Take 80 mg by mouth 2 (two) times daily.    . nitroGLYCERIN (NITROSTAT) 0.4 MG SL tablet Place 0.4 mg under the tongue every 5 (five) minutes as needed for chest pain.    . potassium chloride (K-DUR) 10 MEQ tablet Take 10 mEq by mouth daily as needed (when she starts to feel "sluggish"). 11/21/11 last dose.    . spironolactone (ALDACTONE) 25 MG tablet Take 25 mg by mouth at bedtime.      No results found for this or any previous visit (from the past 48 hour([name])). No results found.  Review Of Systems + wears glasses, + weight gain + partial dentures, + COPD, + angina, + leg edema, + hypertension, + DM, II, + CHF, No CVA, No seizures, No kidney stone, + arthritis.  Blood pressure 161/91, pulse 100, temperature 97.6 F (36.4 C), temperature source Oral, resp. rate 20, height 5\' 4"  (1.626 m), weight 81.784 kg (180 lb 4.8 oz), SpO2 99 %. Physical Exam:  General: [place] built  and nourished in mild respiratory distress. HEENT: [place]/AT, Ave built and nourished. Intel CorporationBrown eyes, PERL, EOMI.  Neck: + JVD, no bruit, no thyromegaly.  Lungs: Bilateral basal crackles.  Heart: Normal S1 and S2. Grade III/VI systolic murmur LSB  Abdomen: Mild swelling, non-tender.  Ext: 2 + edema up to thighs, bilaterally.  CNS: Cranial nerves grossly intact. Moves all 4 extremities. Skin: Warm and dry.  Assessment/Plan Acute Biventricular systolic heart failure  CAD  [name]  DM, II  Hypertension  Anxiety  Admit IV Lasix/Home medications/Hold Ace-inhibitor until renal function is favorable.  [name],Tanya Ribaudo S, MD  06/19/2014, 1:09 PM

## 2014-06-20 ENCOUNTER — Encounter (HOSPITAL_COMMUNITY): Payer: Self-pay | Admitting: *Deleted

## 2014-06-20 LAB — BASIC METABOLIC PANEL
Anion gap: 15 (ref 5–15)
BUN: 43 mg/dL — ABNORMAL HIGH (ref 6–23)
CO2: 20 mEq/L (ref 19–32)
CREATININE: 1.61 mg/dL — AB (ref 0.50–1.10)
Calcium: 8.5 mg/dL (ref 8.4–10.5)
Chloride: 101 mEq/L (ref 96–112)
GFR calc Af Amer: 39 mL/min — ABNORMAL LOW (ref >90)
GFR calc non Af Amer: 33 mL/min — ABNORMAL LOW (ref >90)
GLUCOSE: 140 mg/dL — AB (ref 70–99)
POTASSIUM: 4.1 meq/L (ref 3.7–5.3)
Sodium: 136 mEq/L — ABNORMAL LOW (ref 137–147)

## 2014-06-20 LAB — TROPONIN I: Troponin I: <0.3 ng/mL (ref <0.30)

## 2014-06-20 MED ORDER — FUROSEMIDE 10 MG/ML IJ SOLN
80.0000 mg | Freq: Two times a day (BID) | INTRAMUSCULAR | Status: DC
  Administered 2014-06-20 – 2014-06-27 (×16): 80 mg via INTRAVENOUS
  Filled 2014-06-20 (×19): qty 8

## 2014-06-20 MED ORDER — DOPAMINE-DEXTROSE 3.2-5 MG/ML-% IV SOLN
4.0000 ug/kg/min | INTRAVENOUS | Status: DC
  Administered 2014-06-20 – 2014-06-23 (×3): 5 ug/kg/min via INTRAVENOUS
  Administered 2014-06-25 – 2014-06-26 (×2): 4 ug/kg/min via INTRAVENOUS
  Filled 2014-06-20 (×7): qty 250

## 2014-06-20 MED ORDER — PANTOPRAZOLE SODIUM 40 MG PO TBEC
40.0000 mg | DELAYED_RELEASE_TABLET | Freq: Every day | ORAL | Status: DC
  Administered 2014-06-20 – 2014-06-27 (×8): 40 mg via ORAL
  Filled 2014-06-20 (×7): qty 1

## 2014-06-20 MED ORDER — MAGNESIUM HYDROXIDE 400 MG/5ML PO SUSP
30.0000 mL | Freq: Every day | ORAL | Status: DC | PRN
  Administered 2014-06-20 – 2014-06-24 (×2): 30 mL via ORAL
  Filled 2014-06-20 (×3): qty 30

## 2014-06-20 NOTE — Care Management Note (Unsigned)
    Page 1 of 1   06/27/2014     2:24:05 PM CARE MANAGEMENT NOTE 06/27/2014  Patient:  [name],[name]   Account Number:  [id number]  Date Initiated:  06/20/2014  Documentation initiated by:  Delmore Sear  Subjective/Objective Assessment:   Pt adm on 06/19/14 with CHF.  PTA, pt independent, lives with spouse.     Action/Plan:   Will follow for dc needs as pt progresses.  Pt denies any home needs presently.  [facility]   Anticipated DC Date:  06/30/2014   Anticipated DC Plan:  HOME W HOME HEALTH SERVICES      DC Planning Services  CM consult      Choice offered to / List presented to:             Status of service:  In process, will continue to follow Medicare Important Message given?   (If response is "NO", the following Medicare IM given date fields will be blank) Date Medicare IM given:   Medicare IM given by:   Date Additional Medicare IM given:   Additional Medicare IM given by:    Discharge Disposition:    Per UR Regulation:  Reviewed for med. necessity/level of care/duration of stay  If discussed at Long Length of Stay Meetings, dates discussed:   06/27/2014    Comments:  06/27/14 Sidney AceJulie Aleina Burgio, RN, BSN [phone] Pt cont diuresis with dopamine drip and IV lasix.  Will cont to follow progress.  PT recommending no home follow up.

## 2014-06-20 NOTE — Progress Notes (Signed)
Pt c/o vomited x1 States"When ever I take digoxin makes me sick on my stomach."  Dr Algie Cofferkadakia made aware and ordered protonix 40mg  po QD.  Will continue to monitor.  Amanda PeaNellie Jaquilla Woodroof, Charity fundraiserN.

## 2014-06-21 LAB — BASIC METABOLIC PANEL
ANION GAP: 16 — AB (ref 5–15)
BUN: 42 mg/dL — ABNORMAL HIGH (ref 6–23)
CHLORIDE: 102 meq/L (ref 96–112)
CO2: 22 mEq/L (ref 19–32)
Calcium: 9.1 mg/dL (ref 8.4–10.5)
Creatinine, Ser: 1.39 mg/dL — ABNORMAL HIGH (ref 0.50–1.10)
GFR calc non Af Amer: 40 mL/min — ABNORMAL LOW (ref >90)
GFR, EST AFRICAN AMERICAN: 46 mL/min — AB (ref >90)
Glucose, Bld: 176 mg/dL — ABNORMAL HIGH (ref 70–99)
POTASSIUM: 4.5 meq/L (ref 3.7–5.3)
Sodium: 140 mEq/L (ref 137–147)

## 2014-06-21 NOTE — Progress Notes (Signed)
Ref: Tanya RodriguezKADAKIA,Tanya Ehrler S, MD   Subjective:  Feeling better. Improved diuresis.   Objective:  Vital Signs in the last 24 hours: Temp:  [97.8 F (36.6 C)-98.2 F (36.8 C)] 98 F (36.7 C) (12/12 0506) Pulse Rate:  [85-101] 91 (12/12 0506) Cardiac Rhythm:  [-] Normal sinus rhythm (12/12 0922) Resp:  [17-20] 20 (12/12 0506) BP: (134-161)/(76-87) 152/87 mmHg (12/12 0506) SpO2:  [92 %-95 %] 94 % (12/12 0506) Weight:  [79.4 kg (175 lb 0.7 oz)] 79.4 kg (175 lb 0.7 oz) (12/12 0506)  Physical Exam: BP Readings from Last 1 Encounters:  06/21/14 152/87    Wt Readings from Last 1 Encounters:  06/21/14 79.4 kg (175 lb 0.7 oz)    Weight change: -2.384 kg (-5 lb 4.1 oz)  HEENT: [place]/AT, Eyes-Brown, PERL, EOMI, Conjunctiva-Pink, Sclera-Non-icteric Neck: + JVD, No bruit, Trachea midline. Lungs:  Basal crackles, Bilateral. Cardiac:  Regular rhythm, normal S1 and S2, no S3. III/VI systolic murmur. Abdomen:  Soft, non-tender. Extremities:  2 + edema present. No cyanosis. No clubbing. CNS: AxOx3, Cranial nerves grossly intact, moves all 4 extremities. Right handed. Skin: Warm and dry.   Intake/Output from previous day: 12/11 0701 - 12/12 0700 In: 1309.5 [P.O.:1160; I.V.:149.5] Out: 4221 [Urine:4220; Stool:1]    Lab Results: BMET    Component Value Date/Time   NA 140 06/21/2014 0337   NA 136* 06/20/2014 0035   NA 138 06/19/2014 1450   K 4.5 06/21/2014 0337   K 4.1 06/20/2014 0035   K 4.2 06/19/2014 1450   CL 102 06/21/2014 0337   CL 101 06/20/2014 0035   CL 100 06/19/2014 1450   CO2 22 06/21/2014 0337   CO2 20 06/20/2014 0035   CO2 18* 06/19/2014 1450   GLUCOSE 176* 06/21/2014 0337   GLUCOSE 140* 06/20/2014 0035   GLUCOSE 158* 06/19/2014 1450   BUN 42* 06/21/2014 0337   BUN 43* 06/20/2014 0035   BUN 39* 06/19/2014 1450   CREATININE 1.39* 06/21/2014 0337   CREATININE 1.61* 06/20/2014 0035   CREATININE 1.22* 06/19/2014 1450   CALCIUM 9.1 06/21/2014 0337   CALCIUM 8.5 06/20/2014  0035   CALCIUM 8.9 06/19/2014 1450   GFRNONAA 40* 06/21/2014 0337   GFRNONAA 33* 06/20/2014 0035   GFRNONAA 46* 06/19/2014 1450   GFRAA 46* 06/21/2014 0337   GFRAA 39* 06/20/2014 0035   GFRAA 54* 06/19/2014 1450   CBC    Component Value Date/Time   WBC 5.4 06/19/2014 1450   RBC 5.36* 06/19/2014 1450   HGB 13.4 06/19/2014 1450   HCT 42.0 06/19/2014 1450   PLT 246 06/19/2014 1450   MCV 78.4 06/19/2014 1450   MCH 25.0* 06/19/2014 1450   MCHC 31.9 06/19/2014 1450   RDW 19.1* 06/19/2014 1450   LYMPHSABS 1.1 06/19/2014 1450   MONOABS 0.4 06/19/2014 1450   EOSABS 0.0 06/19/2014 1450   BASOSABS 0.0 06/19/2014 1450   HEPATIC Function Panel  Recent Labs  02/03/14 1239 04/04/14 0439 06/19/14 1450  PROT 6.9 5.8* 6.7   HEMOGLOBIN A1C No components found for: HGA1C,  MPG CARDIAC ENZYMES Lab Results  Component Value Date   CKTOTAL 59 09/07/2012   CKMB 3.2 09/07/2012   TROPONINI <0.30 06/20/2014   TROPONINI <0.30 06/19/2014   TROPONINI <0.30 06/19/2014   BNP  Recent Labs  04/02/14 0843 04/09/14 0451 06/19/14 1420  PROBNP 11219.0* 8206.0* 14027.0*   TSH  Recent Labs  02/03/14 1239  TSH 1.990   CHOLESTEROL No results for input([name]): CHOL in the last 8760 hours.  Scheduled Meds: . allopurinol  100 mg Oral BID  . amLODipine  5 mg Oral BID  . aspirin EC  81 mg Oral Daily  . carvedilol  3.125 mg Oral BID WC  . cloNIDine  0.1 mg Oral QHS  . furosemide  80 mg Intravenous BID  . heparin  5,000 Units Subcutaneous 3 times per day  . pantoprazole  40 mg Oral Daily  . sodium chloride  3 mL Intravenous Q12H  . spironolactone  25 mg Oral QHS   Continuous Infusions: . DOPamine 5 mcg/kg/min (06/20/14 1050)   PRN Meds:.sodium chloride, acetaminophen, magnesium hydroxide, nitroGLYCERIN, ondansetron (ZOFRAN) IV, oxyCODONE-acetaminophen, potassium chloride, sodium chloride  Assessment/Plan: Acute Biventricular systolic heart failure  CAD  [name]  DM, II   Hypertension  Anxiety  Continue medical treatment.     LOS: 2 days    Tanya CobbAjay Rodell Marrs  MD  06/21/2014, 9:44 AM

## 2014-06-21 NOTE — Progress Notes (Signed)
Late entry Ref: Tanya RodriguezKADAKIA,[name] S, MD   Subjective:  Awake. Lasix did not work as expected.  Objective:  Vital Signs in the last 24 hours: Temp:  [97.8 F (36.6 C)-98.2 F (36.8 C)] 98 F (36.7 C) (12/12 0506) Pulse Rate:  [85-101] 91 (12/12 0506) Cardiac Rhythm:  [-] Normal sinus rhythm (12/12 0922) Resp:  [17-20] 20 (12/12 0506) BP: (134-161)/(76-87) 152/87 mmHg (12/12 0506) SpO2:  [92 %-95 %] 94 % (12/12 0506) Weight:  [79.4 kg (175 lb 0.7 oz)] 79.4 kg (175 lb 0.7 oz) (12/12 0506)  Physical Exam: BP Readings from Last 1 Encounters:  06/21/14 152/87    Wt Readings from Last 1 Encounters:  06/21/14 79.4 kg (175 lb 0.7 oz)    Weight change: -2.384 kg (-5 lb 4.1 oz)  HEENT: [place]/AT, Eyes-Brown, PERL, EOMI, Conjunctiva-Pink, Sclera-Non-icteric Neck: + JVD, No bruit, Trachea midline. Lungs:  Crackles, Bilateral. Cardiac:  Regular rhythm, normal S1 and S2, no S3. III/VI systolic murmur LSB. Abdomen:  Soft, non-tender. Extremities:  2 + edema present. No cyanosis. No clubbing. CNS: AxOx3, Cranial nerves grossly intact, moves all 4 extremities. Right handed. Skin: Warm and dry.   Intake/Output from previous day: 12/11 0701 - 12/12 0700 In: 1309.5 [P.O.:1160; I.V.:149.5] Out: 4221 [Urine:4220; Stool:1]    Lab Results: BMET    Component Value Date/Time   NA 140 06/21/2014 0337   NA 136* 06/20/2014 0035   NA 138 06/19/2014 1450   K 4.5 06/21/2014 0337   K 4.1 06/20/2014 0035   K 4.2 06/19/2014 1450   CL 102 06/21/2014 0337   CL 101 06/20/2014 0035   CL 100 06/19/2014 1450   CO2 22 06/21/2014 0337   CO2 20 06/20/2014 0035   CO2 18* 06/19/2014 1450   GLUCOSE 176* 06/21/2014 0337   GLUCOSE 140* 06/20/2014 0035   GLUCOSE 158* 06/19/2014 1450   BUN 42* 06/21/2014 0337   BUN 43* 06/20/2014 0035   BUN 39* 06/19/2014 1450   CREATININE 1.39* 06/21/2014 0337   CREATININE 1.61* 06/20/2014 0035   CREATININE 1.22* 06/19/2014 1450   CALCIUM 9.1 06/21/2014 0337   CALCIUM 8.5  06/20/2014 0035   CALCIUM 8.9 06/19/2014 1450   GFRNONAA 40* 06/21/2014 0337   GFRNONAA 33* 06/20/2014 0035   GFRNONAA 46* 06/19/2014 1450   GFRAA 46* 06/21/2014 0337   GFRAA 39* 06/20/2014 0035   GFRAA 54* 06/19/2014 1450   CBC    Component Value Date/Time   WBC 5.4 06/19/2014 1450   RBC 5.36* 06/19/2014 1450   HGB 13.4 06/19/2014 1450   HCT 42.0 06/19/2014 1450   PLT 246 06/19/2014 1450   MCV 78.4 06/19/2014 1450   MCH 25.0* 06/19/2014 1450   MCHC 31.9 06/19/2014 1450   RDW 19.1* 06/19/2014 1450   LYMPHSABS 1.1 06/19/2014 1450   MONOABS 0.4 06/19/2014 1450   EOSABS 0.0 06/19/2014 1450   BASOSABS 0.0 06/19/2014 1450   HEPATIC Function Panel  Recent Labs  02/03/14 1239 04/04/14 0439 06/19/14 1450  PROT 6.9 5.8* 6.7   HEMOGLOBIN A1C No components found for: HGA1C,  MPG CARDIAC ENZYMES Lab Results  Component Value Date   CKTOTAL 59 09/07/2012   CKMB 3.2 09/07/2012   TROPONINI <0.30 06/20/2014   TROPONINI <0.30 06/19/2014   TROPONINI <0.30 06/19/2014   BNP  Recent Labs  04/02/14 0843 04/09/14 0451 06/19/14 1420  PROBNP 11219.0* 8206.0* 14027.0*   TSH  Recent Labs  02/03/14 1239  TSH 1.990   CHOLESTEROL No results for input(s): CHOL in  the last 8760 hours.  Scheduled Meds: . allopurinol  100 mg Oral BID  . amLODipine  5 mg Oral BID  . aspirin EC  81 mg Oral Daily  . carvedilol  3.125 mg Oral BID WC  . cloNIDine  0.1 mg Oral QHS  . furosemide  80 mg Intravenous BID  . heparin  5,000 Units Subcutaneous 3 times per day  . pantoprazole  40 mg Oral Daily  . sodium chloride  3 mL Intravenous Q12H  . spironolactone  25 mg Oral QHS   Continuous Infusions: . DOPamine 5 mcg/kg/min (06/20/14 1050)   PRN Meds:.sodium chloride, acetaminophen, magnesium hydroxide, nitroGLYCERIN, ondansetron (ZOFRAN) IV, oxyCODONE-acetaminophen, potassium chloride, sodium chloride  Assessment/Plan: Acute Biventricular systolic heart failure  CAD  S/P Stent in LAD   DM, II  Hypertension  Anxiety  Add dopamine for inotropic support and increase lasix dose.   LOS: 1 days    Tanya CobbAjay Shaiann Mcmanamon  MD  06/21/2014, 9:40 AM

## 2014-06-21 NOTE — Evaluation (Signed)
Physical Therapy Evaluation Patient Details Name: [name] MRN: [id number] DOB: 20-Jan-1951 Today's Date: 06/21/2014   History of Present Illness  63 year old female with leg edema and progressive worsening of shortness of breath x 1 month and more so in last one week with 20 + pound weight gain in 3 months. PMH: HTN, DM, CHF, CAD   Clinical Impression  Pt's mobility limited by swelling and discomfort caused by swelling but still mod I with activity. No acute PT needed but pt instructed to ambulate in [place] 3-5x/ day, relayed this to RN. Expect that pt activity level will return to normal as swelling decreases. At this point, no weakness or balance issues. PT signing off.    Follow Up Recommendations No PT follow up    Equipment Recommendations  None recommended by PT    Recommendations for Other Services       Precautions / Restrictions Precautions Precautions: None Restrictions Weight Bearing Restrictions: No      Mobility  Bed Mobility Overal bed mobility: Independent                Transfers Overall transfer level: Independent Equipment used: None                Ambulation/Gait Ambulation/Gait assistance: Modified independent (Device/Increase time) Ambulation Distance (Feet): 75 Feet Assistive device: None;Rolling walker (2 wheeled) Gait Pattern/deviations: Step-through pattern;Wide base of support Gait velocity: decreased due to pain in legs   General Gait Details: pt ambulated safely with no AD but legs felt progressively tighter as she continued to go so given RW for pain control, helped only minimally. Wide BOS due to swelling. Swelling appears to not be within joint as pt not having quad weakness/ buckling.   Stairs            Wheelchair Mobility    Modified Rankin (Stroke Patients Only)       Balance Overall balance assessment: No apparent balance deficits (not formally assessed)                                            Pertinent Vitals/Pain Pain Assessment: Faces Faces Pain Scale: Hurts little more Pain Location: legs swollen and tight Pain Descriptors / Indicators: Tightness Pain Intervention(s): Monitored during session  VSS    Home Living Family/patient expects to be discharged to:: Private residence Living Arrangements: Spouse/significant other Available Help at Discharge: Family;Available 24 hours/day Type of Home: House Home Access: Level entry     Home Layout: One level Home Equipment: None      Prior Function Level of Independence: Independent               Hand Dominance        Extremity/Trunk Assessment   Upper Extremity Assessment: Overall WFL for tasks assessed           Lower Extremity Assessment: Overall WFL for tasks assessed      Cervical / Trunk Assessment: Normal  Communication   Communication: No difficulties  Cognition Arousal/Alertness: Awake/alert Behavior During Therapy: WFL for tasks assessed/performed Overall Cognitive Status: Within Functional Limits for tasks assessed                      General Comments      Exercises General Exercises - Lower Extremity Ankle Circles/Pumps: AROM;Both;15 reps;Seated      Assessment/Plan  PT Assessment Patent does not need any further PT services  PT Diagnosis Difficulty walking;Acute pain   PT Problem List    PT Treatment Interventions     PT Goals (Current goals can be found in the Care Plan section) Acute Rehab PT Goals Patient Stated Goal: decreased swelling and return home PT Goal Formulation: All assessment and education complete, DC therapy    Frequency     Barriers to discharge        Co-evaluation               End of Session Equipment Utilized During Treatment: Gait belt Activity Tolerance: Patient tolerated treatment well Patient left: in chair;with call bell/phone within reach Nurse Communication: Mobility status         Time:  1406-1419 PT Time Calculation 1914-7829(min) (ACUTE ONLY): 13 min   Charges:   PT Evaluation $Initial PT Evaluation Tier I: 1 Procedure PT Treatments $Gait Training: 8-22 mins   PT G Codes:        [name], PT  Acute Rehab Services  [phone]   [name], [name] 06/21/2014, 3:18 PM

## 2014-06-22 LAB — BASIC METABOLIC PANEL
Anion gap: 20 — ABNORMAL HIGH (ref 5–15)
BUN: 36 mg/dL — ABNORMAL HIGH (ref 6–23)
CHLORIDE: 97 meq/L (ref 96–112)
CO2: 21 mEq/L (ref 19–32)
Calcium: 9 mg/dL (ref 8.4–10.5)
Creatinine, Ser: 1.19 mg/dL — ABNORMAL HIGH (ref 0.50–1.10)
GFR calc non Af Amer: 48 mL/min — ABNORMAL LOW (ref >90)
GFR, EST AFRICAN AMERICAN: 56 mL/min — AB (ref >90)
Glucose, Bld: 158 mg/dL — ABNORMAL HIGH (ref 70–99)
POTASSIUM: 4.6 meq/L (ref 3.7–5.3)
Sodium: 138 mEq/L (ref 137–147)

## 2014-06-22 NOTE — Progress Notes (Addendum)
The patient's VS remained stable overnight.  Her BP is elevated at 153/91 this morning.  She is currently sitting up in the chair.  She did not have any complaints of pain overnight and did not receive any PRN medications.  She refused her subq heparin.

## 2014-06-22 NOTE — Progress Notes (Signed)
Ref: Chantil Bari S, MD   Subjective:  Another 2-3 litre diuresis. Leg edema persist  Objective:  Vital Signs in the last 24 hours: Temp:  [97.2 F (36.2 C)-98.8 F (37.1 C)] 98.2 F (36.8 C) (12/13 0521) Pulse Rate:  [94-98] 94 (12/13 0521) Cardiac Rhythm:  [-] Normal sinus rhythm;Bundle branch block (12/12 2258) Resp:  [18-20] 19 (12/13 0521) BP: (146-153)/(83-91) 153/91 mmHg (12/13 0521) SpO2:  [96 %-98 %] 98 % (12/13 0521) Weight:  [77.293 kg (170 lb 6.4 oz)] 77.293 kg (170 lb 6.4 oz) (12/13 0521)  Physical Exam: BP Readings from Last 1 Encounters:  06/22/14 153/91    Wt Readings from Last 1 Encounters:  06/22/14 77.293 kg (170 lb 6.4 oz)    Weight change: -2.107 kg (-4 lb 10.3 oz)  HEENT: [place]/AT, Eyes-Brown, PERL, EOMI, Conjunctiva-Pink, Sclera-Non-icteric Neck: + JVD, No bruit, Trachea midline. Lungs:  Basal crackles, Bilateral. Cardiac:  Regular rhythm, normal S1 and S2, no S3.  Abdomen:  Soft, non-tender. Extremities:  2 + edema present. No cyanosis. No clubbing. CNS: AxOx3, Cranial nerves grossly intact, moves all 4 extremities. Right handed. Skin: Warm and dry.   Intake/Output from previous day: 12/12 0701 - 12/13 0700 In: 960 [P.O.:960] Out: 3950 [Urine:3950]    Lab Results: BMET    Component Value Date/Time   NA 138 06/22/2014 0246   NA 140 06/21/2014 0337   NA 136* 06/20/2014 0035   K 4.6 06/22/2014 0246   K 4.5 06/21/2014 0337   K 4.1 06/20/2014 0035   CL 97 06/22/2014 0246   CL 102 06/21/2014 0337   CL 101 06/20/2014 0035   CO2 21 06/22/2014 0246   CO2 22 06/21/2014 0337   CO2 20 06/20/2014 0035   GLUCOSE 158* 06/22/2014 0246   GLUCOSE 176* 06/21/2014 0337   GLUCOSE 140* 06/20/2014 0035   BUN 36* 06/22/2014 0246   BUN 42* 06/21/2014 0337   BUN 43* 06/20/2014 0035   CREATININE 1.19* 06/22/2014 0246   CREATININE 1.39* 06/21/2014 0337   CREATININE 1.61* 06/20/2014 0035   CALCIUM 9.0 06/22/2014 0246   CALCIUM 9.1 06/21/2014 0337   CALCIUM  8.5 06/20/2014 0035   GFRNONAA 48* 06/22/2014 0246   GFRNONAA 40* 06/21/2014 0337   GFRNONAA 33* 06/20/2014 0035   GFRAA 56* 06/22/2014 0246   GFRAA 46* 06/21/2014 0337   GFRAA 39* 06/20/2014 0035   CBC    Component Value Date/Time   WBC 5.4 06/19/2014 1450   RBC 5.36* 06/19/2014 1450   HGB 13.4 06/19/2014 1450   HCT 42.0 06/19/2014 1450   PLT 246 06/19/2014 1450   MCV 78.4 06/19/2014 1450   MCH 25.0* 06/19/2014 1450   MCHC 31.9 06/19/2014 1450   RDW 19.1* 06/19/2014 1450   LYMPHSABS 1.1 06/19/2014 1450   MONOABS 0.4 06/19/2014 1450   EOSABS 0.0 06/19/2014 1450   BASOSABS 0.0 06/19/2014 1450   HEPATIC Function Panel  Recent Labs  02/03/14 1239 04/04/14 0439 06/19/14 1450  PROT 6.9 5.8* 6.7   HEMOGLOBIN A1C No components found for: HGA1C,  MPG CARDIAC ENZYMES Lab Results  Component Value Date   CKTOTAL 59 09/07/2012   CKMB 3.2 09/07/2012   TROPONINI <0.30 06/20/2014   TROPONINI <0.30 06/19/2014   TROPONINI <0.30 06/19/2014   BNP  Recent Labs  04/02/14 0843 04/09/14 0451 06/19/14 1420  PROBNP 11219.0* 8206.0* 14027.0*   TSH  Recent Labs  02/03/14 1239  TSH 1.990   CHOLESTEROL No results for input(s): CHOL in the last 8760 hours.  Scheduled Meds: . allopurinol  100 mg Oral BID  . amLODipine  5 mg Oral BID  . aspirin EC  81 mg Oral Daily  . carvedilol  3.125 mg Oral BID WC  . cloNIDine  0.1 mg Oral QHS  . furosemide  80 mg Intravenous BID  . heparin  5,000 Units Subcutaneous 3 times per day  . pantoprazole  40 mg Oral Daily  . sodium chloride  3 mL Intravenous Q12H  . spironolactone  25 mg Oral QHS   Continuous Infusions: . DOPamine 5 mcg/kg/min (06/21/14 2008)   PRN Meds:.sodium chloride, acetaminophen, magnesium hydroxide, nitroGLYCERIN, ondansetron (ZOFRAN) IV, oxyCODONE-acetaminophen, potassium chloride, sodium chloride  Assessment/Plan: Acute Biventricular systolic heart failure  CAD  S/P Stent in LAD  DM, II  Hypertension   Anxiety  Continue medical treatment.    LOS: 3 days    Orpah CobbAjay Sarafina Puthoff  MD  06/22/2014, 11:37 AM

## 2014-06-23 LAB — COMPREHENSIVE METABOLIC PANEL WITH GFR
ALT: 11 U/L (ref 0–35)
AST: 43 U/L — ABNORMAL HIGH (ref 0–37)
Albumin: 2.7 g/dL — ABNORMAL LOW (ref 3.5–5.2)
Alkaline Phosphatase: 95 U/L (ref 39–117)
Anion gap: 12 (ref 5–15)
BUN: 33 mg/dL — ABNORMAL HIGH (ref 6–23)
CO2: 26 meq/L (ref 19–32)
Calcium: 8.7 mg/dL (ref 8.4–10.5)
Chloride: 99 meq/L (ref 96–112)
Creatinine, Ser: 1.13 mg/dL — ABNORMAL HIGH (ref 0.50–1.10)
GFR calc Af Amer: 59 mL/min — ABNORMAL LOW
GFR calc non Af Amer: 51 mL/min — ABNORMAL LOW
Glucose, Bld: 188 mg/dL — ABNORMAL HIGH (ref 70–99)
Potassium: 4.9 meq/L (ref 3.7–5.3)
Sodium: 137 meq/L (ref 137–147)
Total Bilirubin: 1 mg/dL (ref 0.3–1.2)
Total Protein: 7 g/dL (ref 6.0–8.3)

## 2014-06-23 MED ORDER — ALBUMIN HUMAN 25 % IV SOLN
12.5000 g | Freq: Once | INTRAVENOUS | Status: AC
  Administered 2014-06-23: 12.5 g via INTRAVENOUS
  Filled 2014-06-23: qty 50

## 2014-06-23 NOTE — Progress Notes (Signed)
The patient's IV site infiltrated at the beginning of the night shift and a new IV was started by the IV team.  Her Dopamine drip was off for approximately an hour during this time.  IV team was notified to start a second IV this morning since she is receiving multiple IV medications.  She stated that she was not feeling very well this morning, and complained of some nausea.  Her VS are stable.

## 2014-06-24 NOTE — Progress Notes (Signed)
Ref: Caleigh Rabelo S, MD   Subjective:  Feeling good with Dopamine drip.   Objective:  Vital Signs in the last 24 hours: Temp:  [97 F (36.1 C)-98.6 F (37 C)] 97.8 F (36.6 C) (12/15 0212) Pulse Rate:  [62-78] 76 (12/15 0212) Cardiac Rhythm:  [-] Normal sinus rhythm (12/14 0813) Resp:  [16-18] 18 (12/15 0212) BP: (96-134)/(53-74) 118/64 mmHg (12/15 0212) SpO2:  [95 %-100 %] 95 % (12/15 0212) Weight:  [75.7 kg (166 lb 14.2 oz)] 75.7 kg (166 lb 14.2 oz) (12/14 [id number])  Physical Exam: BP Readings from Last 1 Encounters:  06/24/14 118/64    Wt Readings from Last 1 Encounters:  06/23/14 75.7 kg (166 lb 14.2 oz)    Weight change:   HEENT: [place]/AT, Eyes-Brown, PERL, EOMI, Conjunctiva-Pink, Sclera-Non-icteric Neck: + JVD, No bruit, Trachea midline. Lungs:  Clearing, Bilateral. Cardiac:  Regular rhythm, normal S1 and S2, no S3.  Abdomen:  Soft, non-tender. Extremities:  2+ edema present. No cyanosis. No clubbing. CNS: AxOx3, Cranial nerves grossly intact, moves all 4 extremities. Right handed. Skin: Warm and dry.   Intake/Output from previous day: 12/14 0701 - 12/15 0700 In: 1401.8 [P.O.:1210; I.V.:141.8; IV Piggyback:50] Out: 1100 [Urine:1100]    Lab Results: BMET    Component Value Date/Time   NA 137 06/23/2014 0405   NA 138 06/22/2014 0246   NA 140 06/21/2014 0337   K 4.9 06/23/2014 0405   K 4.6 06/22/2014 0246   K 4.5 06/21/2014 0337   CL 99 06/23/2014 0405   CL 97 06/22/2014 0246   CL 102 06/21/2014 0337   CO2 26 06/23/2014 0405   CO2 21 06/22/2014 0246   CO2 22 06/21/2014 0337   GLUCOSE 188* 06/23/2014 0405   GLUCOSE 158* 06/22/2014 0246   GLUCOSE 176* 06/21/2014 0337   BUN 33* 06/23/2014 0405   BUN 36* 06/22/2014 0246   BUN 42* 06/21/2014 0337   CREATININE 1.13* 06/23/2014 0405   CREATININE 1.19* 06/22/2014 0246   CREATININE 1.39* 06/21/2014 0337   CALCIUM 8.7 06/23/2014 0405   CALCIUM 9.0 06/22/2014 0246   CALCIUM 9.1 06/21/2014 0337   GFRNONAA 51*  06/23/2014 0405   GFRNONAA 48* 06/22/2014 0246   GFRNONAA 40* 06/21/2014 0337   GFRAA 59* 06/23/2014 0405   GFRAA 56* 06/22/2014 0246   GFRAA 46* 06/21/2014 0337   CBC    Component Value Date/Time   WBC 5.4 06/19/2014 1450   RBC 5.36* 06/19/2014 1450   HGB 13.4 06/19/2014 1450   HCT 42.0 06/19/2014 1450   PLT 246 06/19/2014 1450   MCV 78.4 06/19/2014 1450   MCH 25.0* 06/19/2014 1450   MCHC 31.9 06/19/2014 1450   RDW 19.1* 06/19/2014 1450   LYMPHSABS 1.1 06/19/2014 1450   MONOABS 0.4 06/19/2014 1450   EOSABS 0.0 06/19/2014 1450   BASOSABS 0.0 06/19/2014 1450   HEPATIC Function Panel  Recent Labs  04/04/14 0439 06/19/14 1450 06/23/14 0405  PROT 5.8* 6.7 7.0   HEMOGLOBIN A1C No components found for: HGA1C,  MPG CARDIAC ENZYMES Lab Results  Component Value Date   CKTOTAL 59 09/07/2012   CKMB 3.2 09/07/2012   TROPONINI <0.30 06/20/2014   TROPONINI <0.30 06/19/2014   TROPONINI <0.30 06/19/2014   BNP  Recent Labs  04/02/14 0843 04/09/14 0451 06/19/14 1420  PROBNP 11219.0* 8206.0* 14027.0*   TSH  Recent Labs  02/03/14 1239  TSH 1.990   CHOLESTEROL No results for input(s): CHOL in the last 8760 hours.  Scheduled Meds: . allopurinol  100  mg Oral BID  . amLODipine  5 mg Oral BID  . aspirin EC  81 mg Oral Daily  . carvedilol  3.125 mg Oral BID WC  . cloNIDine  0.1 mg Oral QHS  . furosemide  80 mg Intravenous BID  . heparin  5,000 Units Subcutaneous 3 times per day  . pantoprazole  40 mg Oral Daily  . sodium chloride  3 mL Intravenous Q12H  . spironolactone  25 mg Oral QHS   Continuous Infusions: . DOPamine 4 mcg/kg/min (06/23/14 1141)   PRN Meds:.sodium chloride, acetaminophen, magnesium hydroxide, nitroGLYCERIN, ondansetron (ZOFRAN) IV, oxyCODONE-acetaminophen, potassium chloride, sodium chloride  Assessment/Plan: Acute Biventricular systolic heart failure  CAD  S/P Stent in LAD  DM, II  Hypertension  Anxiety  Decrease dopamine to 4  mcg/kg/min     LOS: 5 days    Orpah CobbAjay Trease Bremner  MD  06/24/2014, 4:43 AM

## 2014-06-24 NOTE — Progress Notes (Signed)
Ref: Tanya Rainone S, MD   Subjective:  Less nausea with decreased dose of dopamine. Decreasing diuresis.   Objective:  Vital Signs in the last 24 hours: Temp:  [97.8 F (36.6 C)-98.9 F (37.2 C)] 98 F (36.7 C) (12/15 1431) Pulse Rate:  [75-82] 82 (12/15 1431) Cardiac Rhythm:  [-] Normal sinus rhythm (12/15 0830) Resp:  [16-18] 16 (12/15 1431) BP: (113-128)/(64-69) 128/69 mmHg (12/15 1431) SpO2:  [95 %-97 %] 97 % (12/15 1431) Weight:  [76.476 kg (168 lb 9.6 oz)] 76.476 kg (168 lb 9.6 oz) (12/15 0537)  Physical Exam: BP Readings from Last 1 Encounters:  06/24/14 128/69    Wt Readings from Last 1 Encounters:  06/24/14 76.476 kg (168 lb 9.6 oz)    Weight change: 0.777 kg (1 lb 11.4 oz)  HEENT: [place]/AT, Eyes-Brown, PERL, EOMI, Conjunctiva-Pink, Sclera-Non-icteric Neck: + JVD, No bruit, Trachea midline. Lungs:  Clear, Bilateral. Cardiac:  Regular rhythm, normal S1 and S2, no S3.  Abdomen:  Soft, non-tender. Extremities:  2 + edema present. No cyanosis. No clubbing. Decreasing presacral edema. CNS: AxOx3, Cranial nerves grossly intact, moves all 4 extremities. Right handed. Skin: Warm and dry.   Intake/Output from previous day: 12/14 0701 - 12/15 0700 In: 1584.7 [P.O.:1270; I.V.:264.7; IV Piggyback:50] Out: 1300 [Urine:1300]    Lab Results: BMET    Component Value Date/Time   NA 137 06/23/2014 0405   NA 138 06/22/2014 0246   NA 140 06/21/2014 0337   K 4.9 06/23/2014 0405   K 4.6 06/22/2014 0246   K 4.5 06/21/2014 0337   CL 99 06/23/2014 0405   CL 97 06/22/2014 0246   CL 102 06/21/2014 0337   CO2 26 06/23/2014 0405   CO2 21 06/22/2014 0246   CO2 22 06/21/2014 0337   GLUCOSE 188* 06/23/2014 0405   GLUCOSE 158* 06/22/2014 0246   GLUCOSE 176* 06/21/2014 0337   BUN 33* 06/23/2014 0405   BUN 36* 06/22/2014 0246   BUN 42* 06/21/2014 0337   CREATININE 1.13* 06/23/2014 0405   CREATININE 1.19* 06/22/2014 0246   CREATININE 1.39* 06/21/2014 0337   CALCIUM 8.7 06/23/2014  0405   CALCIUM 9.0 06/22/2014 0246   CALCIUM 9.1 06/21/2014 0337   GFRNONAA 51* 06/23/2014 0405   GFRNONAA 48* 06/22/2014 0246   GFRNONAA 40* 06/21/2014 0337   GFRAA 59* 06/23/2014 0405   GFRAA 56* 06/22/2014 0246   GFRAA 46* 06/21/2014 0337   CBC    Component Value Date/Time   WBC 5.4 06/19/2014 1450   RBC 5.36* 06/19/2014 1450   HGB 13.4 06/19/2014 1450   HCT 42.0 06/19/2014 1450   PLT 246 06/19/2014 1450   MCV 78.4 06/19/2014 1450   MCH 25.0* 06/19/2014 1450   MCHC 31.9 06/19/2014 1450   RDW 19.1* 06/19/2014 1450   LYMPHSABS 1.1 06/19/2014 1450   MONOABS 0.4 06/19/2014 1450   EOSABS 0.0 06/19/2014 1450   BASOSABS 0.0 06/19/2014 1450   HEPATIC Function Panel  Recent Labs  04/04/14 0439 06/19/14 1450 06/23/14 0405  PROT 5.8* 6.7 7.0   HEMOGLOBIN A1C No components found for: HGA1C,  MPG CARDIAC ENZYMES Lab Results  Component Value Date   CKTOTAL 59 09/07/2012   CKMB 3.2 09/07/2012   TROPONINI <0.30 06/20/2014   TROPONINI <0.30 06/19/2014   TROPONINI <0.30 06/19/2014   BNP  Recent Labs  04/02/14 0843 04/09/14 0451 06/19/14 1420  PROBNP 11219.0* 8206.0* 14027.0*   TSH  Recent Labs  02/03/14 1239  TSH 1.990   CHOLESTEROL No results for input([name]):  CHOL in the last 8760 hours.  Scheduled Meds: . allopurinol  100 mg Oral BID  . amLODipine  5 mg Oral BID  . aspirin EC  81 mg Oral Daily  . carvedilol  3.125 mg Oral BID WC  . cloNIDine  0.1 mg Oral QHS  . furosemide  80 mg Intravenous BID  . heparin  5,000 Units Subcutaneous 3 times per day  . pantoprazole  40 mg Oral Daily  . sodium chloride  3 mL Intravenous Q12H  . spironolactone  25 mg Oral QHS   Continuous Infusions: . DOPamine 4 mcg/kg/min (06/23/14 1141)   PRN Meds:.sodium chloride, acetaminophen, magnesium hydroxide, nitroGLYCERIN, ondansetron (ZOFRAN) IV, oxyCODONE-acetaminophen, potassium chloride, sodium chloride  Assessment/Plan: Acute Biventricular systolic heart failure  CAD   [name]  DM, II  Hypertension  Anxiety  Use ted hose to improve venous return from lower legs.     LOS: 5 days    Orpah CobbAjay Selah Klang  MD  06/24/2014, 6:45 PM

## 2014-06-25 LAB — COMPREHENSIVE METABOLIC PANEL
ALK PHOS: 103 U/L (ref 39–117)
ALT: 10 U/L (ref 0–35)
AST: 15 U/L (ref 0–37)
Albumin: 2.5 g/dL — ABNORMAL LOW (ref 3.5–5.2)
Anion gap: 10 (ref 5–15)
BILIRUBIN TOTAL: 0.7 mg/dL (ref 0.3–1.2)
BUN: 31 mg/dL — ABNORMAL HIGH (ref 6–23)
CALCIUM: 8.4 mg/dL (ref 8.4–10.5)
CO2: 27 meq/L (ref 19–32)
Chloride: 98 mEq/L (ref 96–112)
Creatinine, Ser: 1.27 mg/dL — ABNORMAL HIGH (ref 0.50–1.10)
GFR, EST AFRICAN AMERICAN: 51 mL/min — AB (ref >90)
GFR, EST NON AFRICAN AMERICAN: 44 mL/min — AB (ref >90)
GLUCOSE: 174 mg/dL — AB (ref 70–99)
Potassium: 4.2 mEq/L (ref 3.7–5.3)
SODIUM: 135 meq/L — AB (ref 137–147)
Total Protein: 6.2 g/dL (ref 6.0–8.3)

## 2014-06-25 NOTE — Progress Notes (Signed)
Ref: [name],Irish Piech S, MD   Subjective:  Feeling better. Leg edema decreasing.  Objective:  Vital Signs in the last 24 hours: Temp:  [97.1 F (36.2 C)-97.9 F (36.6 C)] 97.9 F (36.6 C) (12/16 1300) Pulse Rate:  [71-92] 71 (12/16 1300) Cardiac Rhythm:  [-] Normal sinus rhythm (12/16 1212) Resp:  [16-20] 20 (12/16 1300) BP: (124-142)/(68-77) 136/69 mmHg (12/16 1300) SpO2:  [96 %-100 %] 100 % (12/16 1300) Weight:  [76.567 kg (168 lb 12.8 oz)] 76.567 kg (168 lb 12.8 oz) (12/16 0523)  Physical Exam: BP Readings from Last 1 Encounters:  06/25/14 136/69    Wt Readings from Last 1 Encounters:  06/25/14 76.567 kg (168 lb 12.8 oz)    Weight change: 0.091 kg (3.2 oz)  HEENT: [place]/AT, Eyes-Brown, PERL, EOMI, Conjunctiva-Pink, Sclera-Non-icteric Neck: + JVD, No bruit, Trachea midline. Lungs:  Clearing, Bilateral. Cardiac:  Regular rhythm, normal S1 and S2, no S3. II/VI systolic murmur. Abdomen:  Soft, non-tender. Extremities:  1-2 + edema present. No cyanosis. No clubbing. + presacral edema CNS: AxOx3, Cranial nerves grossly intact, moves all 4 extremities. Right handed. Skin: Warm and dry.   Intake/Output from previous day: 12/15 0701 - 12/16 0700 In: 1490.5 [P.O.:1220; I.V.:270.5] Out: 1975 [Urine:1975]    Lab Results: BMET    Component Value Date/Time   NA 135* 06/25/2014 0520   NA 137 06/23/2014 0405   NA 138 06/22/2014 0246   K 4.2 06/25/2014 0520   K 4.9 06/23/2014 0405   K 4.6 06/22/2014 0246   CL 98 06/25/2014 0520   CL 99 06/23/2014 0405   CL 97 06/22/2014 0246   CO2 27 06/25/2014 0520   CO2 26 06/23/2014 0405   CO2 21 06/22/2014 0246   GLUCOSE 174* 06/25/2014 0520   GLUCOSE 188* 06/23/2014 0405   GLUCOSE 158* 06/22/2014 0246   BUN 31* 06/25/2014 0520   BUN 33* 06/23/2014 0405   BUN 36* 06/22/2014 0246   CREATININE 1.27* 06/25/2014 0520   CREATININE 1.13* 06/23/2014 0405   CREATININE 1.19* 06/22/2014 0246   CALCIUM 8.4 06/25/2014 0520   CALCIUM 8.7  06/23/2014 0405   CALCIUM 9.0 06/22/2014 0246   GFRNONAA 44* 06/25/2014 0520   GFRNONAA 51* 06/23/2014 0405   GFRNONAA 48* 06/22/2014 0246   GFRAA 51* 06/25/2014 0520   GFRAA 59* 06/23/2014 0405   GFRAA 56* 06/22/2014 0246   CBC    Component Value Date/Time   WBC 5.4 06/19/2014 1450   RBC 5.36* 06/19/2014 1450   HGB 13.4 06/19/2014 1450   HCT 42.0 06/19/2014 1450   PLT 246 06/19/2014 1450   MCV 78.4 06/19/2014 1450   MCH 25.0* 06/19/2014 1450   MCHC 31.9 06/19/2014 1450   RDW 19.1* 06/19/2014 1450   LYMPHSABS 1.1 06/19/2014 1450   MONOABS 0.4 06/19/2014 1450   EOSABS 0.0 06/19/2014 1450   BASOSABS 0.0 06/19/2014 1450   HEPATIC Function Panel  Recent Labs  06/19/14 1450 06/23/14 0405 06/25/14 0520  PROT 6.7 7.0 6.2   HEMOGLOBIN A1C No components found for: HGA1C,  MPG CARDIAC ENZYMES Lab Results  Component Value Date   CKTOTAL 59 09/07/2012   CKMB 3.2 09/07/2012   TROPONINI <0.30 06/20/2014   TROPONINI <0.30 06/19/2014   TROPONINI <0.30 06/19/2014   BNP  Recent Labs  04/02/14 0843 04/09/14 0451 06/19/14 1420  PROBNP 11219.0* 8206.0* 14027.0*   TSH  Recent Labs  02/03/14 1239  TSH 1.990   CHOLESTEROL No results for input(s): CHOL in the last 8760 hours.  Scheduled Meds: . allopurinol  100 mg Oral BID  . amLODipine  5 mg Oral BID  . aspirin EC  81 mg Oral Daily  . carvedilol  3.125 mg Oral BID WC  . cloNIDine  0.1 mg Oral QHS  . furosemide  80 mg Intravenous BID  . heparin  5,000 Units Subcutaneous 3 times per day  . pantoprazole  40 mg Oral Daily  . sodium chloride  3 mL Intravenous Q12H  . spironolactone  25 mg Oral QHS   Continuous Infusions: . DOPamine 4 mcg/kg/min (06/25/14 1500)   PRN Meds:.sodium chloride, acetaminophen, magnesium hydroxide, nitroGLYCERIN, ondansetron (ZOFRAN) IV, oxyCODONE-acetaminophen, potassium chloride, sodium chloride  Assessment/Plan: Acute Biventricular systolic heart failure  CAD  S/P Stent in LAD   DM, II  Hypertension  Anxiety  Increase activity as tolerated.     LOS: 6 days    Orpah CobbAjay Yakov Bergen  MD  06/25/2014, 6:54 PM

## 2014-06-26 NOTE — Progress Notes (Signed)
Ref: [name],Leevon Upperman S, MD   Subjective:  Improved diuresis. Less shortness of breath.  Objective:  Vital Signs in the last 24 hours: Temp:  [97.6 F (36.4 C)-98.3 F (36.8 C)] 98.3 F (36.8 C) (12/17 0521) Pulse Rate:  [71-83] 79 (12/17 0521) Cardiac Rhythm:  [-] Normal sinus rhythm (12/16 2030) Resp:  [18-20] 18 (12/17 0521) BP: (136-147)/(69-82) 137/69 mmHg (12/17 0521) SpO2:  [97 %-100 %] 100 % (12/17 0521) Weight:  [74.8 kg (164 lb 14.5 oz)] 74.8 kg (164 lb 14.5 oz) (12/17 0521)  Physical Exam: BP Readings from Last 1 Encounters:  06/26/14 137/69    Wt Readings from Last 1 Encounters:  06/26/14 74.8 kg (164 lb 14.5 oz)    Weight change: -1.767 kg (-3 lb 14.3 oz)  HEENT: [place]/AT, Eyes-Brown, PERL, EOMI, Conjunctiva-Pink, Sclera-Non-icteric Neck: + JVD, No bruit, Trachea midline. Lungs:  Clearing, Bilateral. Cardiac:  Regular rhythm, normal S1 and S2, no S3. II/VI systolic murmur. Abdomen:  Soft, non-tender. Extremities:  1 + edema present. No cyanosis. No clubbing. + presacral edema. CNS: AxOx3, Cranial nerves grossly intact, moves all 4 extremities. Right handed. Skin: Warm and dry.   Intake/Output from previous day: 12/16 0701 - 12/17 0700 In: 1769.8 [P.O.:1510; I.V.:259.8] Out: 4200 [Urine:4200]    Lab Results: BMET    Component Value Date/Time   NA 135* 06/25/2014 0520   NA 137 06/23/2014 0405   NA 138 06/22/2014 0246   K 4.2 06/25/2014 0520   K 4.9 06/23/2014 0405   K 4.6 06/22/2014 0246   CL 98 06/25/2014 0520   CL 99 06/23/2014 0405   CL 97 06/22/2014 0246   CO2 27 06/25/2014 0520   CO2 26 06/23/2014 0405   CO2 21 06/22/2014 0246   GLUCOSE 174* 06/25/2014 0520   GLUCOSE 188* 06/23/2014 0405   GLUCOSE 158* 06/22/2014 0246   BUN 31* 06/25/2014 0520   BUN 33* 06/23/2014 0405   BUN 36* 06/22/2014 0246   CREATININE 1.27* 06/25/2014 0520   CREATININE 1.13* 06/23/2014 0405   CREATININE 1.19* 06/22/2014 0246   CALCIUM 8.4 06/25/2014 0520   CALCIUM 8.7  06/23/2014 0405   CALCIUM 9.0 06/22/2014 0246   GFRNONAA 44* 06/25/2014 0520   GFRNONAA 51* 06/23/2014 0405   GFRNONAA 48* 06/22/2014 0246   GFRAA 51* 06/25/2014 0520   GFRAA 59* 06/23/2014 0405   GFRAA 56* 06/22/2014 0246   CBC    Component Value Date/Time   WBC 5.4 06/19/2014 1450   RBC 5.36* 06/19/2014 1450   HGB 13.4 06/19/2014 1450   HCT 42.0 06/19/2014 1450   PLT 246 06/19/2014 1450   MCV 78.4 06/19/2014 1450   MCH 25.0* 06/19/2014 1450   MCHC 31.9 06/19/2014 1450   RDW 19.1* 06/19/2014 1450   LYMPHSABS 1.1 06/19/2014 1450   MONOABS 0.4 06/19/2014 1450   EOSABS 0.0 06/19/2014 1450   BASOSABS 0.0 06/19/2014 1450   HEPATIC Function Panel  Recent Labs  06/19/14 1450 06/23/14 0405 06/25/14 0520  PROT 6.7 7.0 6.2   HEMOGLOBIN A1C No components found for: HGA1C,  MPG CARDIAC ENZYMES Lab Results  Component Value Date   CKTOTAL 59 09/07/2012   CKMB 3.2 09/07/2012   TROPONINI <0.30 06/20/2014   TROPONINI <0.30 06/19/2014   TROPONINI <0.30 06/19/2014   BNP  Recent Labs  04/02/14 0843 04/09/14 0451 06/19/14 1420  PROBNP 11219.0* 8206.0* 14027.0*   TSH  Recent Labs  02/03/14 1239  TSH 1.990   CHOLESTEROL No results for input(s): CHOL in the last  8760 hours.  Scheduled Meds: . allopurinol  100 mg Oral BID  . amLODipine  5 mg Oral BID  . aspirin EC  81 mg Oral Daily  . carvedilol  3.125 mg Oral BID WC  . cloNIDine  0.1 mg Oral QHS  . furosemide  80 mg Intravenous BID  . heparin  5,000 Units Subcutaneous 3 times per day  . pantoprazole  40 mg Oral Daily  . sodium chloride  3 mL Intravenous Q12H  . spironolactone  25 mg Oral QHS   Continuous Infusions: . DOPamine 4 mcg/kg/min (06/25/14 1500)   PRN Meds:.sodium chloride, acetaminophen, magnesium hydroxide, nitroGLYCERIN, ondansetron (ZOFRAN) IV, oxyCODONE-acetaminophen, potassium chloride, sodium chloride  Assessment/Plan: Acute Biventricular systolic heart failure  CAD  S/P Stent in LAD   DM, II  Hypertension  Anxiety  Continue diuresis.    LOS: 7 days    Orpah CobbAjay Quinnton Bury  MD  06/26/2014, 10:18 AM

## 2014-06-26 NOTE — Progress Notes (Signed)
Alert and responsive. Skin warm and dry. Pt has been sitting up in chair at bedside for entire shift. She is independent, and doesn't ask for help with transfers. Continues on dobutamine IV as ordered. Denies pain and discomfort.

## 2014-06-27 LAB — BASIC METABOLIC PANEL
Anion gap: 10 (ref 5–15)
BUN: 26 mg/dL — AB (ref 6–23)
CO2: 29 mEq/L (ref 19–32)
CREATININE: 1.12 mg/dL — AB (ref 0.50–1.10)
Calcium: 9 mg/dL (ref 8.4–10.5)
Chloride: 98 mEq/L (ref 96–112)
GFR calc Af Amer: 60 mL/min — ABNORMAL LOW (ref >90)
GFR, EST NON AFRICAN AMERICAN: 52 mL/min — AB (ref >90)
Glucose, Bld: 183 mg/dL — ABNORMAL HIGH (ref 70–99)
POTASSIUM: 4.3 meq/L (ref 3.7–5.3)
Sodium: 137 mEq/L (ref 137–147)

## 2014-06-27 MED ORDER — DOPAMINE-DEXTROSE 3.2-5 MG/ML-% IV SOLN
3.5000 ug/kg/min | INTRAVENOUS | Status: DC
  Administered 2014-06-27: 3.5 ug/kg/min via INTRAVENOUS
  Filled 2014-06-27: qty 250

## 2014-06-27 NOTE — Progress Notes (Signed)
Ref: [name],Virat Prather S, MD   Subjective:  Decreasing presacral edema.  Objective:  Vital Signs in the last 24 hours: Temp:  [98 F (36.7 C)-98.6 F (37 C)] 98.2 F (36.8 C) (12/18 1457) Pulse Rate:  [63-91] 63 (12/18 1457) Cardiac Rhythm:  [-] Normal sinus rhythm (12/18 0900) Resp:  [16-18] 18 (12/18 1457) BP: (129-144)/(68-81) 129/69 mmHg (12/18 1457) SpO2:  [97 %-99 %] 99 % (12/18 1457) Weight:  [72.439 kg (159 lb 11.2 oz)] 72.439 kg (159 lb 11.2 oz) (12/18 0534)  Physical Exam: BP Readings from Last 1 Encounters:  06/27/14 129/69    Wt Readings from Last 1 Encounters:  06/27/14 72.439 kg (159 lb 11.2 oz)    Weight change: -2.361 kg (-5 lb 3.3 oz)  HEENT: [place]/AT, Eyes-Brown, PERL, EOMI, Conjunctiva-Pink, Sclera-Non-icteric Neck: No JVD, No bruit, Trachea midline. Lungs:  Clear, Bilateral. Cardiac:  Regular rhythm, normal S1 and S2, no S3.  Abdomen:  Soft, non-tender. Extremities:  1 + edema present. No cyanosis. No clubbing. + presacral edema. CNS: AxOx3, Cranial nerves grossly intact, moves all 4 extremities. Right handed. Skin: Warm and dry.   Intake/Output from previous day: 12/17 0701 - 12/18 0700 In: 914.4 [P.O.:840; I.V.:74.4] Out: 4250 [Urine:4250]    Lab Results: BMET    Component Value Date/Time   NA 137 06/27/2014 0354   NA 135* 06/25/2014 0520   NA 137 06/23/2014 0405   K 4.3 06/27/2014 0354   K 4.2 06/25/2014 0520   K 4.9 06/23/2014 0405   CL 98 06/27/2014 0354   CL 98 06/25/2014 0520   CL 99 06/23/2014 0405   CO2 29 06/27/2014 0354   CO2 27 06/25/2014 0520   CO2 26 06/23/2014 0405   GLUCOSE 183* 06/27/2014 0354   GLUCOSE 174* 06/25/2014 0520   GLUCOSE 188* 06/23/2014 0405   BUN 26* 06/27/2014 0354   BUN 31* 06/25/2014 0520   BUN 33* 06/23/2014 0405   CREATININE 1.12* 06/27/2014 0354   CREATININE 1.27* 06/25/2014 0520   CREATININE 1.13* 06/23/2014 0405   CALCIUM 9.0 06/27/2014 0354   CALCIUM 8.4 06/25/2014 0520   CALCIUM 8.7 06/23/2014  0405   GFRNONAA 52* 06/27/2014 0354   GFRNONAA 44* 06/25/2014 0520   GFRNONAA 51* 06/23/2014 0405   GFRAA 60* 06/27/2014 0354   GFRAA 51* 06/25/2014 0520   GFRAA 59* 06/23/2014 0405   CBC    Component Value Date/Time   WBC 5.4 06/19/2014 1450   RBC 5.36* 06/19/2014 1450   HGB 13.4 06/19/2014 1450   HCT 42.0 06/19/2014 1450   PLT 246 06/19/2014 1450   MCV 78.4 06/19/2014 1450   MCH 25.0* 06/19/2014 1450   MCHC 31.9 06/19/2014 1450   RDW 19.1* 06/19/2014 1450   LYMPHSABS 1.1 06/19/2014 1450   MONOABS 0.4 06/19/2014 1450   EOSABS 0.0 06/19/2014 1450   BASOSABS 0.0 06/19/2014 1450   HEPATIC Function Panel  Recent Labs  06/19/14 1450 06/23/14 0405 06/25/14 0520  PROT 6.7 7.0 6.2   HEMOGLOBIN A1C No components found for: HGA1C,  MPG CARDIAC ENZYMES Lab Results  Component Value Date   CKTOTAL 59 09/07/2012   CKMB 3.2 09/07/2012   TROPONINI <0.30 06/20/2014   TROPONINI <0.30 06/19/2014   TROPONINI <0.30 06/19/2014   BNP  Recent Labs  04/02/14 0843 04/09/14 0451 06/19/14 1420  PROBNP 11219.0* 8206.0* 14027.0*   TSH  Recent Labs  02/03/14 1239  TSH 1.990   CHOLESTEROL No results for input(s): CHOL in the last 8760 hours.  Scheduled Meds: .  allopurinol  100 mg Oral BID  . amLODipine  5 mg Oral BID  . aspirin EC  81 mg Oral Daily  . carvedilol  3.125 mg Oral BID WC  . cloNIDine  0.1 mg Oral QHS  . furosemide  80 mg Intravenous BID  . heparin  5,000 Units Subcutaneous 3 times per day  . pantoprazole  40 mg Oral Daily  . sodium chloride  3 mL Intravenous Q12H  . spironolactone  25 mg Oral QHS   Continuous Infusions: . DOPamine 3.5 mcg/kg/min (06/27/14 1037)   PRN Meds:.sodium chloride, acetaminophen, magnesium hydroxide, nitroGLYCERIN, ondansetron (ZOFRAN) IV, oxyCODONE-acetaminophen, potassium chloride, sodium chloride  Assessment/Plan: Acute Biventricular systolic heart failure  CAD  S/P Stent in LAD  DM, II  Hypertension   Anxiety  Continue diuresis. Home soon.   LOS: 8 days    Orpah CobbAjay Elke Holtry  MD  06/27/2014, 3:23 PM

## 2014-06-27 NOTE — Progress Notes (Signed)
Per Dr. Algie CofferKadakia, its okay to hold drip (Dopamine) 15 minutes to give lasix dose as needed.

## 2014-06-27 NOTE — Progress Notes (Signed)
Attempted to change IVF to PIV site and d/c outdated PIV.  The second site infiltrated and had to be d/c.  Patient refusing to be stuck at this time stating she wants to wait until after Dr Algie CofferKadakia rounds on her d/t her being a very hard stick.  Will continue to monitor and pass on to day RN. Troy SineWalker, [place] Rollyson M

## 2014-06-28 LAB — BASIC METABOLIC PANEL
Anion gap: 12 (ref 5–15)
BUN: 26 mg/dL — AB (ref 6–23)
CALCIUM: 9 mg/dL (ref 8.4–10.5)
CO2: 28 mEq/L (ref 19–32)
CREATININE: 1.06 mg/dL (ref 0.50–1.10)
Chloride: 97 mEq/L (ref 96–112)
GFR, EST AFRICAN AMERICAN: 64 mL/min — AB (ref >90)
GFR, EST NON AFRICAN AMERICAN: 55 mL/min — AB (ref >90)
Glucose, Bld: 172 mg/dL — ABNORMAL HIGH (ref 70–99)
Potassium: 4.2 mEq/L (ref 3.7–5.3)
Sodium: 137 mEq/L (ref 137–147)

## 2014-06-28 MED ORDER — METFORMIN HCL 500 MG PO TABS: 500.0000 mg | ORAL_TABLET | Freq: Two times a day (BID) | ORAL | Status: DC

## 2014-06-28 MED ORDER — METFORMIN HCL 500 MG PO TABS
500.0000 mg | ORAL_TABLET | Freq: Two times a day (BID) | ORAL | Status: DC
  Filled 2014-06-28 (×3): qty 1

## 2014-06-28 NOTE — Discharge Summary (Signed)
Physician Discharge Summary  Patient ID: [name] MRN: [id number] DOB/AGE: December 30, 1950 63 y.o.  Admit date: 06/19/2014 Discharge date: 06/28/2014  Admission Diagnoses: Acute biven tricular systolic heart failure CAD Status post stent in LAD Diabetes mellitus type 2 Hypertension Anxiety  Discharge Diagnoses:  Principal Problem: *Acute biventricular systolic heart failure* CAD Status post stent in LAD Diabetes mellitus type 2 Hypertension Anxiety  Discharged Condition: fair  Hospital Course: 63 year old female with a progressively worsening leg edema and shortness of breath over one month. Had 20+ bone weight gain in 3 months with no chest pain fever cough. She had slow and steady diuresis with IV Lasix use augmented with IV dopamine use. She lost 20-25 pounds of weight had improved breathing and ambulation and was discharged home in stable condition with the discussion on taking medications regularly, resuming metformin and checking weight, fluid intake and blood sugar levels on daily basis. she will be followed by me in 1 week and in one month.   Consults: Cardiology  Significant Diagnostic Studies: labs: Near normal CBC, troponin I normal, electrolytes essentially unremarkable BUN elevated at 43 creatinine 1.61 which was improved to 1.06 on day of discharge.albumin low at 2.7 g/dL Blood sugar elevated at 170-180 mg range.  EKG: Normal sinus rhythm, left atrial enlargement.   Echocardiogram: Left ventricle: Cavity was moderately dilated, systolic function was severely reduced estimated ejection fraction was in the range of 20% to 25% with severe hypokinesis of the entire anteroseptal myocardium and moderate hypokinesis of the entire inferolateral myocardium. Grade 1 diastolic dysfunction Mitral valve: severe regurgitation Left atrium atrium was severely dilated Right ventricle: the cavity size was mildly dilated and systolic function was moderately reduced Tricuspid valve:  severe regurgitation Pulmonary artery systolic pressure was severely increased Pericardium: A trivial pericardial effusion was identified along with a right pleural effusion and a left pleural effusion.  Treatments: cardiac meds: carvedilol, amlodipine, digoxin, furosemide and spironolactone. IV Dopamine drip 3-5 mcg.   Discharge Exam: Blood pressure 148/80, pulse 83, temperature 98.3 F (36.8 C), temperature source Oral, resp. rate 16, height 5\' 4"  (1.626 m), weight 70.58 kg (155 lb 9.6 oz), SpO2 99 %. HEENT: [place]/AT, Eyes-Brown, PERL, EOMI, Conjunctiva-Pink, Sclera-Non-icteric Neck: No JVD, No bruit, Trachea midline. Lungs: Clear, Bilateral. Cardiac: Regular rhythm, normal S1 and S2, no S3. II/VI systolic murmur. Abdomen: Soft, non-tender. Extremities: 1 + edema present. No cyanosis. No clubbing. + presacral edema. CNS: AxOx3, Cranial nerves grossly intact, moves all 4 extremities. Right handed. Skin: Warm and dry.  Disposition: 01-Home or Self Care     Medication List    TAKE these medications        allopurinol 100 MG tablet  Commonly known as:  ZYLOPRIM  Take 100 mg by mouth 2 (two) times daily.     amLODipine 5 MG tablet  Commonly known as:  NORVASC  Take 5 mg by mouth 2 (two) times daily.     aspirin 81 MG EC tablet  Take 1 tablet (81 mg total) by mouth daily.     carvedilol 3.125 MG tablet  Commonly known as:  COREG  Take 1 tablet (3.125 mg total) by mouth 2 (two) times daily with a meal.     cloNIDine 0.2 MG tablet  Commonly known as:  CATAPRES  Take 0.1 mg by mouth at bedtime.     digoxin 0.125 MG tablet  Commonly known as:  LANOXIN  Take 0.5 tablets (0.0625 mg total) by mouth daily.  furosemide 40 MG tablet  Commonly known as:  LASIX  Take 80 mg by mouth 2 (two) times daily.     metFORMIN 500 MG tablet  Commonly known as:  GLUCOPHAGE  Take 1 tablet (500 mg total) by mouth 2 (two) times daily with a meal.     nitroGLYCERIN 0.4 MG SL tablet   Commonly known as:  NITROSTAT  Place 0.4 mg under the tongue every 5 (five) minutes as needed for chest pain.     potassium chloride 10 MEQ tablet  Commonly known as:  K-DUR  Take 10 mEq by mouth daily as needed (when she starts to feel "sluggish"). 11/21/11 last dose.     spironolactone 25 MG tablet  Commonly known as:  ALDACTONE  Take 25 mg by mouth at bedtime.           Follow-up Information    [facility]. Schedule an appointment as soon as possible for a visit in 1 month.   Specialty:  Cardiology   Why:  heart failure, bring daily weight record   Contact information:   [address] [place] [address] [phone]       Signed: [name],Meah Jiron [name] 06/28/2014, 7:55 AM

## 2014-08-12 ENCOUNTER — Inpatient Hospital Stay (HOSPITAL_COMMUNITY)
Admission: RE | Admit: 2014-08-12 | Discharge: 2014-08-18 | DRG: 293 | Disposition: A | Discharge status: Home / Self Care | Payer: No Typology Code available for payment source | Source: Ambulatory Visit | Attending: Cardiovascular Disease | Admitting: Cardiovascular Disease

## 2014-08-12 ENCOUNTER — Encounter (HOSPITAL_COMMUNITY): Payer: Self-pay | Admitting: General Practice

## 2014-08-12 DIAGNOSIS — I5041 Acute combined systolic (congestive) and diastolic (congestive) heart failure: Secondary | ICD-10-CM | POA: Diagnosis present

## 2014-08-12 DIAGNOSIS — E119 Type 2 diabetes mellitus without complications: Secondary | ICD-10-CM | POA: Diagnosis present

## 2014-08-12 DIAGNOSIS — I5082 Biventricular heart failure: Secondary | ICD-10-CM

## 2014-08-12 DIAGNOSIS — I10 Essential (primary) hypertension: Secondary | ICD-10-CM | POA: Diagnosis present

## 2014-08-12 DIAGNOSIS — I129 Hypertensive chronic kidney disease with stage 1 through stage 4 chronic kidney disease, or unspecified chronic kidney disease: Secondary | ICD-10-CM | POA: Diagnosis present

## 2014-08-12 DIAGNOSIS — Z79899 Other long term (current) drug therapy: Secondary | ICD-10-CM

## 2014-08-12 DIAGNOSIS — Z955 Presence of coronary angioplasty implant and graft: Secondary | ICD-10-CM | POA: Diagnosis not present

## 2014-08-12 DIAGNOSIS — F419 Anxiety disorder, unspecified: Secondary | ICD-10-CM | POA: Diagnosis present

## 2014-08-12 DIAGNOSIS — Z7982 Long term (current) use of aspirin: Secondary | ICD-10-CM

## 2014-08-12 DIAGNOSIS — N183 Chronic kidney disease, stage 3 (moderate): Secondary | ICD-10-CM | POA: Diagnosis present

## 2014-08-12 DIAGNOSIS — Z87891 Personal history of nicotine dependence: Secondary | ICD-10-CM

## 2014-08-12 DIAGNOSIS — I5023 Acute on chronic systolic (congestive) heart failure: Secondary | ICD-10-CM | POA: Diagnosis present

## 2014-08-12 DIAGNOSIS — I2510 Atherosclerotic heart disease of native coronary artery without angina pectoris: Secondary | ICD-10-CM | POA: Diagnosis present

## 2014-08-12 DIAGNOSIS — E1129 Type 2 diabetes mellitus with other diabetic kidney complication: Secondary | ICD-10-CM

## 2014-08-12 HISTORY — DX: Reserved for inherently not codable concepts without codable children: IMO0001

## 2014-08-12 LAB — COMPREHENSIVE METABOLIC PANEL
ALBUMIN: 2.9 g/dL — AB (ref 3.5–5.2)
ALT: 13 U/L (ref 0–35)
AST: 26 U/L (ref 0–37)
Alkaline Phosphatase: 79 U/L (ref 39–117)
Anion gap: 13 (ref 5–15)
BILIRUBIN TOTAL: 1.5 mg/dL — AB (ref 0.3–1.2)
BUN: 50 mg/dL — ABNORMAL HIGH (ref 6–23)
CALCIUM: 8.5 mg/dL (ref 8.4–10.5)
CO2: 18 mmol/L — AB (ref 19–32)
Chloride: 107 mmol/L (ref 96–112)
Creatinine, Ser: 1.72 mg/dL — ABNORMAL HIGH (ref 0.50–1.10)
GFR calc Af Amer: 36 mL/min — ABNORMAL LOW (ref >90)
GFR calc non Af Amer: 31 mL/min — ABNORMAL LOW (ref >90)
Glucose, Bld: 144 mg/dL — ABNORMAL HIGH (ref 70–99)
Potassium: 4.4 mmol/L (ref 3.5–5.1)
Sodium: 138 mmol/L (ref 135–145)
TOTAL PROTEIN: 6.5 g/dL (ref 6.0–8.3)

## 2014-08-12 LAB — CBC WITH DIFFERENTIAL/PLATELET
Basophils Absolute: 0 10*3/uL (ref 0.0–0.1)
Basophils Relative: 1 % (ref 0–1)
EOS ABS: 0 10*3/uL (ref 0.0–0.7)
Eosinophils Relative: 1 % (ref 0–5)
HCT: 44.3 % (ref 36.0–46.0)
HEMOGLOBIN: 14.6 g/dL (ref 12.0–15.0)
Lymphocytes Relative: 29 % (ref 12–46)
Lymphs Abs: 1.1 10*3/uL (ref 0.7–4.0)
MCH: 25.4 pg — ABNORMAL LOW (ref 26.0–34.0)
MCHC: 33 g/dL (ref 30.0–36.0)
MCV: 77.2 fL — ABNORMAL LOW (ref 78.0–100.0)
MONO ABS: 0.3 10*3/uL (ref 0.1–1.0)
Monocytes Relative: 7 % (ref 3–12)
NEUTROS PCT: 62 % (ref 43–77)
Neutro Abs: 2.3 10*3/uL (ref 1.7–7.7)
Platelets: 246 10*3/uL (ref 150–400)
RBC: 5.74 MIL/uL — AB (ref 3.87–5.11)
RDW: 16.9 % — AB (ref 11.5–15.5)
WBC: 3.6 10*3/uL — ABNORMAL LOW (ref 4.0–10.5)

## 2014-08-12 LAB — PROTIME-INR
INR: 1.34 (ref 0.00–1.49)
Prothrombin Time: 16.7 seconds — ABNORMAL HIGH (ref 11.6–15.2)

## 2014-08-12 LAB — GLUCOSE, CAPILLARY
GLUCOSE-CAPILLARY: 133 mg/dL — AB (ref 70–99)
GLUCOSE-CAPILLARY: 139 mg/dL — AB (ref 70–99)
Glucose-Capillary: 201 mg/dL — ABNORMAL HIGH (ref 70–99)

## 2014-08-12 LAB — TROPONIN I: Troponin I: <0.03 ng/mL (ref <0.031)

## 2014-08-12 LAB — BRAIN NATRIURETIC PEPTIDE: B Natriuretic Peptide: 1398.6 pg/mL — ABNORMAL HIGH (ref 0.0–100.0)

## 2014-08-12 MED ORDER — HEPARIN SODIUM (PORCINE) 5000 UNIT/ML IJ SOLN
5000.0000 [IU] | Freq: Three times a day (TID) | INTRAMUSCULAR | Status: DC
  Filled 2014-08-12 (×19): qty 1

## 2014-08-12 MED ORDER — CARVEDILOL 3.125 MG PO TABS
3.1250 mg | ORAL_TABLET | Freq: Two times a day (BID) | ORAL | Status: DC
  Administered 2014-08-12 – 2014-08-18 (×12): 3.125 mg via ORAL
  Filled 2014-08-12 (×16): qty 1

## 2014-08-12 MED ORDER — AMLODIPINE BESYLATE 5 MG PO TABS
5.0000 mg | ORAL_TABLET | Freq: Two times a day (BID) | ORAL | Status: DC
  Administered 2014-08-12 – 2014-08-18 (×13): 5 mg via ORAL
  Filled 2014-08-12 (×14): qty 1

## 2014-08-12 MED ORDER — NITROGLYCERIN 0.4 MG SL SUBL: 0.4000 mg | SUBLINGUAL_TABLET | SUBLINGUAL | Status: DC | PRN

## 2014-08-12 MED ORDER — FUROSEMIDE 10 MG/ML IJ SOLN
40.0000 mg | Freq: Two times a day (BID) | INTRAMUSCULAR | Status: DC
  Administered 2014-08-12 – 2014-08-13 (×4): 40 mg via INTRAVENOUS
  Filled 2014-08-12 (×6): qty 4

## 2014-08-12 MED ORDER — SODIUM CHLORIDE 0.9 % IJ SOLN
3.0000 mL | Freq: Two times a day (BID) | INTRAMUSCULAR | Status: DC
  Administered 2014-08-12 – 2014-08-18 (×10): 3 mL via INTRAVENOUS

## 2014-08-12 MED ORDER — SPIRONOLACTONE 25 MG PO TABS
25.0000 mg | ORAL_TABLET | Freq: Every day | ORAL | Status: DC
  Administered 2014-08-12 – 2014-08-17 (×6): 25 mg via ORAL
  Filled 2014-08-12 (×8): qty 1

## 2014-08-12 MED ORDER — ASPIRIN EC 81 MG PO TBEC
81.0000 mg | DELAYED_RELEASE_TABLET | Freq: Every day | ORAL | Status: DC
  Administered 2014-08-12 – 2014-08-18 (×7): 81 mg via ORAL
  Filled 2014-08-12 (×7): qty 1

## 2014-08-12 MED ORDER — CLONIDINE HCL 0.1 MG PO TABS
0.1000 mg | ORAL_TABLET | Freq: Every day | ORAL | Status: DC
  Administered 2014-08-12 – 2014-08-17 (×6): 0.1 mg via ORAL
  Filled 2014-08-12 (×7): qty 1

## 2014-08-12 MED ORDER — ACETAMINOPHEN 325 MG PO TABS
650.0000 mg | ORAL_TABLET | ORAL | Status: DC | PRN
  Administered 2014-08-15 – 2014-08-16 (×2): 650 mg via ORAL
  Filled 2014-08-12 (×2): qty 2

## 2014-08-12 MED ORDER — DIGOXIN 0.0625 MG HALF TABLET
0.0625 mg | ORAL_TABLET | Freq: Every day | ORAL | Status: DC
  Administered 2014-08-12 – 2014-08-18 (×7): 0.0625 mg via ORAL
  Filled 2014-08-12 (×7): qty 1

## 2014-08-12 MED ORDER — ALLOPURINOL 100 MG PO TABS
100.0000 mg | ORAL_TABLET | Freq: Two times a day (BID) | ORAL | Status: DC
  Administered 2014-08-12 – 2014-08-18 (×13): 100 mg via ORAL
  Filled 2014-08-12 (×14): qty 1

## 2014-08-12 MED ORDER — SODIUM CHLORIDE 0.9 % IJ SOLN: 3.0000 mL | INTRAMUSCULAR | Status: DC | PRN

## 2014-08-12 MED ORDER — INSULIN ASPART 100 UNIT/ML ~~LOC~~ SOLN
0.0000 [IU] | Freq: Three times a day (TID) | SUBCUTANEOUS | Status: DC
  Administered 2014-08-12 – 2014-08-13 (×2): 3 [IU] via SUBCUTANEOUS
  Administered 2014-08-14: 1 [IU] via SUBCUTANEOUS
  Administered 2014-08-15: 2 [IU] via SUBCUTANEOUS
  Administered 2014-08-16 – 2014-08-18 (×3): 1 [IU] via SUBCUTANEOUS

## 2014-08-12 MED ORDER — SODIUM CHLORIDE 0.9 % IV SOLN: 250.0000 mL | INTRAVENOUS | Status: DC | PRN

## 2014-08-12 MED ORDER — ONDANSETRON HCL 4 MG/2ML IJ SOLN: 4.0000 mg | Freq: Four times a day (QID) | INTRAMUSCULAR | Status: DC | PRN

## 2014-08-12 NOTE — H&P (Signed)
Referring Physician:  [name] is an 64 y.o. female.                       Chief Complaint: Leg swelling and shortness of breath.  HPI: 64 year old female with leg edema and progressive worsening of shortness of breath and 10 pound weight gain x 1-2 weeks has past medical history of acute biventricular systolic heart failure, CAD, status post stent in LAD, diabetes mellitus type 2,  hypertension and anxiety. No fever, cough or chest pain.  Past Medical History  Diagnosis Date  . Hypertension   . Diabetes mellitus   . Heart failure   . Coronary artery disease   . Stented coronary artery   . CHF (congestive heart failure)   . Shortness of breath dyspnea       Past Surgical History  Procedure Laterality Date  . Left and right heart catheterization with coronary angiogram N/A 11/25/2011    Procedure: LEFT AND RIGHT HEART CATHETERIZATION WITH CORONARY ANGIOGRAM;  Surgeon: Robynn PaneMohan N Harwani, MD;  Location: [facility] CATH LAB;  Service: Cardiovascular;  Laterality: N/A;  . Percutaneous coronary stent intervention (pci-s) Right 11/25/2011    Procedure: PERCUTANEOUS CORONARY STENT INTERVENTION (PCI-S);  Surgeon: Robynn PaneMohan N Harwani, MD;  [facility];  Service: Cardiovascular;  Laterality: Right;  . Left and right heart catheterization with coronary angiogram N/A 09/07/2012    Procedure: LEFT AND RIGHT HEART CATHETERIZATION WITH CORONARY ANGIOGRAM;  Surgeon: Ricki RodriguezAjay S Mi Balla, MD;  Location: [facility] CATH LAB;  Service: Cardiovascular;  Laterality: N/A;    History reviewed. No pertinent family history. Social History:  reports that she has quit smoking. She has never used smokeless tobacco. She reports that she does not drink alcohol or use illicit drugs.  Allergies: No Known Allergies  Medications Prior to Admission  Medication Sig Dispense Refill  . allopurinol (ZYLOPRIM) 100 MG tablet Take 100 mg by mouth 2 (two) times daily.    Marland Kitchen. amLODipine (NORVASC) 5 MG tablet Take 5 mg by mouth 2 (two)  times daily.    Marland Kitchen. aspirin EC 81 MG EC tablet Take 1 tablet (81 mg total) by mouth daily.    . carvedilol (COREG) 3.125 MG tablet Take 1 tablet (3.125 mg total) by mouth 2 (two) times daily with a meal. 60 tablet 6  . cloNIDine (CATAPRES) 0.2 MG tablet Take 0.1 mg by mouth at bedtime.     . digoxin (LANOXIN) 0.125 MG tablet Take 0.5 tablets (0.0625 mg total) by mouth daily. 15 tablet 1  . furosemide (LASIX) 40 MG tablet Take 80 mg by mouth 2 (two) times daily.    . metFORMIN (GLUCOPHAGE) 500 MG tablet Take 1 tablet (500 mg total) by mouth 2 (two) times daily with a meal. 60 tablet 1  . nitroGLYCERIN (NITROSTAT) 0.4 MG SL tablet Place 0.4 mg under the tongue every 5 (five) minutes as needed for chest pain.    . potassium chloride (K-DUR) 10 MEQ tablet Take 10 mEq by mouth daily as needed (when she starts to feel "sluggish"). 11/21/11 last dose.    . spironolactone (ALDACTONE) 25 MG tablet Take 25 mg by mouth at bedtime.      Results for orders placed or performed during the hospital encounter of 08/12/14 (from the past 48 hour(s))  Glucose, capillary     Status: Abnormal   Collection Time: 08/12/14 11:39 AM  Result Value Ref Range   Glucose-Capillary 133 (H) 70 - 99 mg/dL  Comment 1 Notify RN    No results found.  Review Of Systems + wears glasses, + weight gain + partial dentures, + COPD, + angina, + leg edema, + hypertension, + DM, II, + CHF, No CVA, No seizures, No kidney stone, + arthritis.  Physical Exam:  Blood pressure 147/89, pulse 93, temperature 97.5 F (36.4 C), temperature source Oral, resp. rate 18, height 5' (1.524 m), weight 79.8 kg (175 lb 14.8 oz), SpO2 97 %. General: Averagely built and nourished in mild respiratory distress. HEENT: [place]/AT, brown eyes, PERL, EOMI. conjuntiva-pink, sclera-white. Neck: + JVD, no bruit, no thyromegaly.  Lungs: Bilateral basal crackles.  Heart: Normal S1 and S2. Grade III/VI systolic murmur LSB  Abdomen: Mild swelling, non-tender.   Ext: 2 + edema up to thighs, bilaterally.  CNS: Cranial nerves grossly intact. Moves all 4 extremities. Skin: Warm and dry.  Assessment/Plan Acute Biventricular systolic heart failure  CAD  S/P Stent in LAD  DM, II  Hypertension  Anxiety  Admit/IV lasix r/o MI/Home medications.  [name], MD  08/12/2014, 12:30 PM

## 2014-08-13 ENCOUNTER — Inpatient Hospital Stay (HOSPITAL_COMMUNITY): Payer: No Typology Code available for payment source

## 2014-08-13 LAB — GLUCOSE, CAPILLARY
GLUCOSE-CAPILLARY: 120 mg/dL — AB (ref 70–99)
Glucose-Capillary: 115 mg/dL — ABNORMAL HIGH (ref 70–99)
Glucose-Capillary: 163 mg/dL — ABNORMAL HIGH (ref 70–99)
Glucose-Capillary: 165 mg/dL — ABNORMAL HIGH (ref 70–99)

## 2014-08-13 LAB — BASIC METABOLIC PANEL
Anion gap: 8 (ref 5–15)
BUN: 51 mg/dL — ABNORMAL HIGH (ref 6–23)
CHLORIDE: 106 mmol/L (ref 96–112)
CO2: 22 mmol/L (ref 19–32)
Calcium: 8.1 mg/dL — ABNORMAL LOW (ref 8.4–10.5)
Creatinine, Ser: 1.63 mg/dL — ABNORMAL HIGH (ref 0.50–1.10)
GFR calc Af Amer: 38 mL/min — ABNORMAL LOW (ref >90)
GFR, EST NON AFRICAN AMERICAN: 33 mL/min — AB (ref >90)
Glucose, Bld: 163 mg/dL — ABNORMAL HIGH (ref 70–99)
POTASSIUM: 4 mmol/L (ref 3.5–5.1)
Sodium: 136 mmol/L (ref 135–145)

## 2014-08-13 LAB — DIGOXIN LEVEL: Digoxin Level: 0.3 ng/mL — ABNORMAL LOW (ref 0.8–2.0)

## 2014-08-13 LAB — TROPONIN I

## 2014-08-13 LAB — HEMOGLOBIN A1C
HEMOGLOBIN A1C: 8.4 % — AB (ref 4.8–5.6)
Mean Plasma Glucose: 194 mg/dL

## 2014-08-13 MED ORDER — DOBUTAMINE IN D5W 4-5 MG/ML-% IV SOLN
3.0000 ug/kg/min | INTRAVENOUS | Status: DC
  Administered 2014-08-13: 2.5 ug/kg/min via INTRAVENOUS
  Administered 2014-08-14: 5 ug/kg/min via INTRAVENOUS
  Filled 2014-08-13 (×2): qty 250

## 2014-08-13 NOTE — Progress Notes (Signed)
Heart Failure Navigator Consult Note  Presentation: [name] is a 64 year old female with leg edema and progressive worsening of shortness of breath and 10 pound weight gain x 1-2 weeks has past medical history of acute biventricular systolic heart failure, CAD, status post stent in LAD, diabetes mellitus type 2, hypertension and anxiety. No fever, cough or chest pain.  Past Medical History  Diagnosis Date  . Hypertension   . Diabetes mellitus   . Heart failure   . Coronary artery disease   . Stented coronary artery   . CHF (congestive heart failure)   . Shortness of breath dyspnea     History   Social History  . Marital Status: Married    Spouse Name: N/A    Number of Children: N/A  . Years of Education: N/A   Social History Main Topics  . Smoking status: Former Games developer  . Smokeless tobacco: Never Used     Comment: quit  smoking in the 80's  . Alcohol Use: No  . Drug Use: No  . Sexual Activity: None   Other Topics Concern  . None   Social History Narrative    ECHO:Study Conclusions--06/19/14  - Left ventricle: The cavity size was moderately dilated. Systolic function was severely reduced. The estimated ejection fraction was in the range of 20% to 25%. There is severe hypokinesis of the entire anteroseptal myocardium. There is moderate hypokinesis of the entire inferolateral myocardium. Doppler parameters are consistent with abnormal left ventricular relaxation (grade 1 diastolic dysfunction). - Mitral valve: There was severe regurgitation. - Left atrium: The atrium was severely dilated. - Right ventricle: The cavity size was mildly dilated. Wall thickness was normal. Systolic function was moderately reduced. - Tricuspid valve: There was severe regurgitation. - Pulmonary arteries: Systolic pressure was severely increased. PA peak pressure: 67 mm Hg (S). - Pericardium, extracardiac: A trivial pericardial effusion was identified. There was  a right pleural effusion. There was a left pleural effusion.  Transthoracic echocardiography. M-mode, complete 2D, spectral Doppler, and color Doppler. Birthdate: Patient birthdate: Aug 28, 1950. Age: Patient is 64 yr old. Sex: Gender: female. BMI: 26.3 kg/m^2. Blood pressure:   161/91 Patient status: Inpatient. Study date: Study date: 06/19/2014. Study time: 03:09 PM. Location: Bedside.  BNP    Component Value Date/Time   BNP 1398.6* 08/12/2014 1409    ProBNP    Component Value Date/Time   PROBNP 14027.0* 06/19/2014 1420     Education Assessment and Provision:  Detailed education and instructions provided on heart failure disease management including the following:  Signs and symptoms of Heart Failure When to call the physician Importance of daily weights Low sodium diet Fluid restriction Medication management Anticipated future follow-up appointments  Patient education given on each of the above topics.  Patient acknowledges understanding and acceptance of all instructions.  [name] was just in the hospital in December.  She relates that she was doing ok at home and recently started feeling "bad " again.  She says that her Lasix does not work as "good as it should".  She admits that she does not weigh daily.  She claims that she can "just tell when I have fluid".  She is still quite active saying that does still cook and grocery shop at home.  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  I will plan  to see her again to reinforce education and follow her inpatient progress.    Education Materials:  "Living Better With Heart Failure" Booklet, Daily Weight Tracker Tool    High Risk Criteria for Readmission and/or Poor Patient Outcomes:  (Recommend Follow-up with [facility])---yes -she would benefit from  outpatient follow-up with AHF clinic    EF <30%-Yes   2 or more admissions in 6 months- Yes  Difficult social situation-Yes--many stressors at home  Demonstrates medication noncompliance- No    Barriers of Care:  Knowledge and compliance  Discharge Planning:   Plans to discharge to home with husband

## 2014-08-13 NOTE — Progress Notes (Signed)
Ref: [name], MD   Subjective:  Feeling better. Claims to have more urine output than recorded.  Objective:  Vital Signs in the last 24 hours: Temp:  [97.4 F (36.3 C)-98.1 F (36.7 C)] 98.1 F (36.7 C) (02/03 0753) Pulse Rate:  [59-72] 67 (02/03 0753) Cardiac Rhythm:  [-] Normal sinus rhythm;Bundle branch block (02/02 1919) Resp:  [16-18] 18 (02/03 0753) BP: (129-137)/(72-82) 137/79 mmHg (02/03 0753) SpO2:  [98 %-99 %] 98 % (02/03 0753) Weight:  [78.902 kg (173 lb 15.2 oz)] 78.902 kg (173 lb 15.2 oz) (02/03 0300)  Physical Exam: BP Readings from Last 1 Encounters:  08/13/14 137/79    Wt Readings from Last 1 Encounters:  08/13/14 78.902 kg (173 lb 15.2 oz)    Weight change:   HEENT: [place]/AT, Eyes-Brown, PERL, EOMI, Conjunctiva-Pink, Sclera-Non-icteric Neck: + JVD, No bruit, Trachea midline. Lungs:  Clear, Bilateral. Cardiac:  Regular rhythm, normal S1 and S2, no S3. III/VI systolic murmur. Abdomen:  Soft, non-tender. Extremities:  2 + edema present. No cyanosis. No clubbing. CNS: AxOx3, Cranial nerves grossly intact, moves all 4 extremities. Right handed. Skin: Warm and dry.   Intake/Output from previous day: 02/02 0701 - 02/03 0700 In: 720 [P.O.:720] Out: 650 [Urine:650]    Lab Results: BMET    Component Value Date/Time   NA 136 08/13/2014 0036   NA 138 08/12/2014 1403   NA 137 06/28/2014 0400   K 4.0 08/13/2014 0036   K 4.4 08/12/2014 1403   K 4.2 06/28/2014 0400   CL 106 08/13/2014 0036   CL 107 08/12/2014 1403   CL 97 06/28/2014 0400   CO2 22 08/13/2014 0036   CO2 18* 08/12/2014 1403   CO2 28 06/28/2014 0400   GLUCOSE 163* 08/13/2014 0036   GLUCOSE 144* 08/12/2014 1403   GLUCOSE 172* 06/28/2014 0400   BUN 51* 08/13/2014 0036   BUN 50* 08/12/2014 1403   BUN 26* 06/28/2014 0400   CREATININE 1.63* 08/13/2014 0036   CREATININE 1.72* 08/12/2014 1403   CREATININE 1.06 06/28/2014 0400   CALCIUM 8.1* 08/13/2014 0036   CALCIUM 8.5 08/12/2014 1403   CALCIUM 9.0 06/28/2014 0400   GFRNONAA 33* 08/13/2014 0036   GFRNONAA 31* 08/12/2014 1403   GFRNONAA 55* 06/28/2014 0400   GFRAA 38* 08/13/2014 0036   GFRAA 36* 08/12/2014 1403   GFRAA 64* 06/28/2014 0400   CBC    Component Value Date/Time   WBC 3.6* 08/12/2014 1403   RBC 5.74* 08/12/2014 1403   HGB 14.6 08/12/2014 1403   HCT 44.3 08/12/2014 1403   PLT 246 08/12/2014 1403   MCV 77.2* 08/12/2014 1403   MCH 25.4* 08/12/2014 1403   MCHC 33.0 08/12/2014 1403   RDW 16.9* 08/12/2014 1403   LYMPHSABS 1.1 08/12/2014 1403   MONOABS 0.3 08/12/2014 1403   EOSABS 0.0 08/12/2014 1403   BASOSABS 0.0 08/12/2014 1403   HEPATIC Function Panel  Recent Labs  06/23/14 0405 06/25/14 0520 08/12/14 1403  PROT 7.0 6.2 6.5   HEMOGLOBIN A1C No components found for: HGA1C,  MPG CARDIAC ENZYMES Lab Results  Component Value Date   CKTOTAL 59 09/07/2012   CKMB 3.2 09/07/2012   TROPONINI <0.03 08/13/2014   TROPONINI <0.03 08/12/2014   TROPONINI <0.03 08/12/2014   BNP  Recent Labs  04/02/14 0843 04/09/14 0451 06/19/14 1420  PROBNP 11219.0* 8206.0* 14027.0*   TSH  Recent Labs  02/03/14 1239  TSH 1.990   CHOLESTEROL No results for input(s): CHOL in the last 8760 hours.  Scheduled  Meds: . allopurinol  100 mg Oral BID  . amLODipine  5 mg Oral BID  . aspirin EC  81 mg Oral Daily  . carvedilol  3.125 mg Oral BID WC  . cloNIDine  0.1 mg Oral QHS  . digoxin  0.0625 mg Oral Daily  . furosemide  40 mg Intravenous BID  . heparin  5,000 Units Subcutaneous 3 times per day  . insulin aspart  0-9 Units Subcutaneous TID WC  . sodium chloride  3 mL Intravenous Q12H  . spironolactone  25 mg Oral QHS   Continuous Infusions:  PRN Meds:.sodium chloride, acetaminophen, nitroGLYCERIN, ondansetron (ZOFRAN) IV, sodium chloride  Assessment/Plan: Acute on chronic Biventricular systolic heart failure  CAD  S/P Stent in LAD  DM, II  Hypertension  Anxiety  Continue diuresis.    LOS:  1 day    Orpah CobbAjay Terresa Marlett  MD  08/13/2014, 1:09 PM

## 2014-08-13 NOTE — Evaluation (Signed)
Physical Therapy Evaluation Patient Details Name: [name] MRN: [id number] DOB: 1950-12-09 Today's Date: 08/13/2014   History of Present Illness  64 year old female with leg edema and progressive worsening of shortness of breath and 10 pound weight gain x 1-2 weeks has past medical history of acute biventricular systolic heart failure, CAD, status post stent in LAD, diabetes mellitus type 2,  hypertension and anxiety  Clinical Impression  Patient with some modest limitations in mobility secondary to stiffness from increased edema. Patient required no physical assist and has good recognition of deviations in gait withy edema. No further acute PT needs at this time. Encouraged continued there ex and mobility throughout hospitalization. Patient in agreement. No further acute PT needs, will sign off.    Follow Up Recommendations No PT follow up    Equipment Recommendations  None recommended by PT    Recommendations for Other Services       Precautions / Restrictions Precautions Precautions: Fall      Mobility  Bed Mobility Overal bed mobility: Modified Independent             General bed mobility comments: increased time to perform, no physical assist required  Transfers Overall transfer level: Independent Equipment used: None                Ambulation/Gait Ambulation/Gait assistance: Modified independent (Device/Increase time) Ambulation Distance (Feet): 110 Feet   Gait Pattern/deviations:  (increased lateral sway secondary to edema)        Stairs            Wheelchair Mobility    Modified Rankin (Stroke Patients Only)       Balance Overall balance assessment: Needs assistance Sitting-balance support: Feet supported Sitting balance-Leahy Scale: Good     Standing balance support: During functional activity Standing balance-Leahy Scale: Good                               Pertinent Vitals/Pain      Home Living  Family/patient expects to be discharged to:: Private residence Living Arrangements: Alone Available Help at Discharge: Family;Available 24 hours/day Type of Home: House Home Access: Level entry     Home Layout: One level Home Equipment: None      Prior Function Level of Independence: Independent               Hand Dominance   Dominant Hand: Right    Extremity/Trunk Assessment               Lower Extremity Assessment:  (increased bilateral LE edema)         Communication   Communication: No difficulties  Cognition Arousal/Alertness: Awake/alert Behavior During Therapy: WFL for tasks assessed/performed Overall Cognitive Status: Within Functional Limits for tasks assessed                      General Comments      Exercises  Briefly reviewed there ex to perform to assist with edema management.      Assessment/Plan    PT Assessment Patent does not need any further PT services  PT Diagnosis Difficulty walking   PT Problem List    PT Treatment Interventions     PT Goals (Current goals can be found in the Care Plan section) Acute Rehab PT Goals PT Goal Formulation: All assessment and education complete, DC therapy    Frequency     Barriers  to discharge        Co-evaluation               End of Session   Activity Tolerance: Patient tolerated treatment well Patient left: in chair;with call bell/phone within reach           Time: [id number] PT Time Calculation (min) (ACUTE ONLY): 14 min   Charges:   PT Evaluation $Initial PT Evaluation Tier I: 1 Procedure     PT G CodesFabio Asa:        [name] 08/13/2014, 3:52 PM Charlotte Crumbevon Simora Dingee, PT DPT  [phone]

## 2014-08-14 LAB — GLUCOSE, CAPILLARY
Glucose-Capillary: 128 mg/dL — ABNORMAL HIGH (ref 70–99)
Glucose-Capillary: 117 mg/dL — ABNORMAL HIGH (ref 70–99)
Glucose-Capillary: 150 mg/dL — ABNORMAL HIGH (ref 70–99)
Glucose-Capillary: 182 mg/dL — ABNORMAL HIGH (ref 70–99)

## 2014-08-14 LAB — BASIC METABOLIC PANEL
Anion gap: 9 (ref 5–15)
BUN: 52 mg/dL — ABNORMAL HIGH (ref 6–23)
CHLORIDE: 109 mmol/L (ref 96–112)
CO2: 21 mmol/L (ref 19–32)
Calcium: 8.4 mg/dL (ref 8.4–10.5)
Creatinine, Ser: 1.7 mg/dL — ABNORMAL HIGH (ref 0.50–1.10)
GFR, EST AFRICAN AMERICAN: 36 mL/min — AB (ref >90)
GFR, EST NON AFRICAN AMERICAN: 31 mL/min — AB (ref >90)
Glucose, Bld: 106 mg/dL — ABNORMAL HIGH (ref 70–99)
Potassium: 4.1 mmol/L (ref 3.5–5.1)
SODIUM: 139 mmol/L (ref 135–145)

## 2014-08-14 MED ORDER — FUROSEMIDE 10 MG/ML IJ SOLN
80.0000 mg | Freq: Two times a day (BID) | INTRAMUSCULAR | Status: DC
  Administered 2014-08-14 – 2014-08-18 (×9): 80 mg via INTRAVENOUS
  Filled 2014-08-14 (×10): qty 8

## 2014-08-14 NOTE — Care Management Note (Signed)
    Page 1 of 1   08/18/2014     4:34:05 PM CARE MANAGEMENT NOTE 08/18/2014  Patient:  [name],[name]   Account Number:  [id number]  Date Initiated:  08/14/2014  Documentation initiated by:  Makeila Yamaguchi  Subjective/Objective Assessment:   Pt adm on 08/12/14 with CHF exacerbation.   PTA, pt resides at home with spouse.     Action/Plan:   PT recommending no OP follow up.  Will cont to follow for dc needs as pt progresses.   Anticipated DC Date:  08/17/2014   Anticipated DC Plan:  HOME/SELF CARE      DC Planning Services  CM consult      Choice offered to / List presented to:             Status of service:  Completed, signed off Medicare Important Message given?  NO (If response is "NO", the following Medicare IM given date fields will be blank) Date Medicare IM given:   Medicare IM given by:   Date Additional Medicare IM given:   Additional Medicare IM given by:    Discharge Disposition:  HOME/SELF CARE  Per UR Regulation:  Reviewed for med. necessity/level of care/duration of stay  If discussed at Long Length of Stay Meetings, dates discussed:    Comments:  08/18/14 Sidney AceJulie Wlliam Grosso, RN, BSN [phone] Pt discharging home today; no dc needs identified.

## 2014-08-14 NOTE — Progress Notes (Signed)
Pharmacist Heart Failure Core Measure Documentation  Assessment: Tanya SpikeMargaret A [name] has an EF documented as 20-25% on 12/15 by Echo.  Rationale: Heart failure patients with left ventricular systolic dysfunction (LVSD) and an EF < 40% should be prescribed an angiotensin converting enzyme inhibitor (ACEI) or angiotensin receptor blocker (ARB) at discharge unless a contraindication is documented in the medical record.  This patient is not currently on an ACEI or ARB for HF.  This note is being placed in the record in order to provide documentation that a contraindication to the use of these agents is present for this encounter.  ACE Inhibitor or Angiotensin Receptor Blocker is contraindicated (specify all that apply)  []   ACEI allergy AND ARB allergy []   Angioedema []   Moderate or severe aortic stenosis []   Hyperkalemia []   Hypotension []   Renal artery stenosis [x]   Worsening renal function, preexisting renal disease or dysfunction   [name], [name] 08/14/2014 10:52 AM

## 2014-08-14 NOTE — Progress Notes (Signed)
Ref: [name],Tauni Sanks S, MD   Subjective:  Slow diuresis. Afebrile.  Objective:  Vital Signs in the last 24 hours: Temp:  [97.4 F (36.3 C)-98.1 F (36.7 C)] 98.1 F (36.7 C) (02/04 0552) Pulse Rate:  [59-69] 60 (02/04 0552) Cardiac Rhythm:  [-] Normal sinus rhythm;Bundle branch block (02/03 1955) Resp:  [18] 18 (02/04 0552) BP: (102-143)/(68-88) 135/69 mmHg (02/04 0552) SpO2:  [99 %-100 %] 100 % (02/04 0552) Weight:  [79.4 kg (175 lb 0.7 oz)] 79.4 kg (175 lb 0.7 oz) (02/04 0552)  Physical Exam: BP Readings from Last 1 Encounters:  08/14/14 135/69    Wt Readings from Last 1 Encounters:  08/14/14 79.4 kg (175 lb 0.7 oz)    Weight change: -0.4 kg (-14.1 oz)  HEENT: [place]/AT, Eyes-Brown, PERL, EOMI, Conjunctiva-Pink, Sclera-Non-icteric Neck: + JVD, No bruit, Trachea midline. Lungs:  Clearing, Bilateral. Cardiac:  Regular rhythm, normal S1 and S2, no S3. III/VI systolic murmur. Abdomen:  Soft, non-tender. Extremities:  2 + edema present. No cyanosis. No clubbing. CNS: AxOx3, Cranial nerves grossly intact, moves all 4 extremities. Right handed. Skin: Warm and dry.   Intake/Output from previous day: 02/03 0701 - 02/04 0700 In: 1036 [P.O.:960; I.V.:76] Out: 1350 [Urine:1350]    Lab Results: BMET    Component Value Date/Time   NA 139 08/14/2014 0500   NA 136 08/13/2014 0036   NA 138 08/12/2014 1403   K 4.1 08/14/2014 0500   K 4.0 08/13/2014 0036   K 4.4 08/12/2014 1403   CL 109 08/14/2014 0500   CL 106 08/13/2014 0036   CL 107 08/12/2014 1403   CO2 21 08/14/2014 0500   CO2 22 08/13/2014 0036   CO2 18* 08/12/2014 1403   GLUCOSE 106* 08/14/2014 0500   GLUCOSE 163* 08/13/2014 0036   GLUCOSE 144* 08/12/2014 1403   BUN 52* 08/14/2014 0500   BUN 51* 08/13/2014 0036   BUN 50* 08/12/2014 1403   CREATININE 1.70* 08/14/2014 0500   CREATININE 1.63* 08/13/2014 0036   CREATININE 1.72* 08/12/2014 1403   CALCIUM 8.4 08/14/2014 0500   CALCIUM 8.1* 08/13/2014 0036   CALCIUM 8.5  08/12/2014 1403   GFRNONAA 31* 08/14/2014 0500   GFRNONAA 33* 08/13/2014 0036   GFRNONAA 31* 08/12/2014 1403   GFRAA 36* 08/14/2014 0500   GFRAA 38* 08/13/2014 0036   GFRAA 36* 08/12/2014 1403   CBC    Component Value Date/Time   WBC 3.6* 08/12/2014 1403   RBC 5.74* 08/12/2014 1403   HGB 14.6 08/12/2014 1403   HCT 44.3 08/12/2014 1403   PLT 246 08/12/2014 1403   MCV 77.2* 08/12/2014 1403   MCH 25.4* 08/12/2014 1403   MCHC 33.0 08/12/2014 1403   RDW 16.9* 08/12/2014 1403   LYMPHSABS 1.1 08/12/2014 1403   MONOABS 0.3 08/12/2014 1403   EOSABS 0.0 08/12/2014 1403   BASOSABS 0.0 08/12/2014 1403   HEPATIC Function Panel  Recent Labs  06/23/14 0405 06/25/14 0520 08/12/14 1403  PROT 7.0 6.2 6.5   HEMOGLOBIN A1C No components found for: HGA1C,  MPG CARDIAC ENZYMES Lab Results  Component Value Date   CKTOTAL 59 09/07/2012   CKMB 3.2 09/07/2012   TROPONINI <0.03 08/13/2014   TROPONINI <0.03 08/12/2014   TROPONINI <0.03 08/12/2014   BNP  Recent Labs  04/02/14 0843 04/09/14 0451 06/19/14 1420  PROBNP 11219.0* 8206.0* 14027.0*   TSH  Recent Labs  02/03/14 1239  TSH 1.990   CHOLESTEROL No results for input(s): CHOL in the last 8760 hours.  Scheduled Meds: .  allopurinol  100 mg Oral BID  . amLODipine  5 mg Oral BID  . aspirin EC  81 mg Oral Daily  . carvedilol  3.125 mg Oral BID WC  . cloNIDine  0.1 mg Oral QHS  . digoxin  0.0625 mg Oral Daily  . furosemide  40 mg Intravenous BID  . heparin  5,000 Units Subcutaneous 3 times per day  . insulin aspart  0-9 Units Subcutaneous TID WC  . sodium chloride  3 mL Intravenous Q12H  . spironolactone  25 mg Oral QHS   Continuous Infusions: . DOBUTamine 2.5 mcg/kg/min (08/13/14 1900)   PRN Meds:.sodium chloride, acetaminophen, nitroGLYCERIN, ondansetron (ZOFRAN) IV, sodium chloride  Assessment/Plan: Acute on chronic Biventricular systolic heart failure  CAD  S/P Stent in LAD  DM, II  Hypertension   Anxiety  Increase dobutamine dose and lasix dose.    LOS: 2 days    [name]  MD  08/14/2014, 8:40 AM

## 2014-08-15 LAB — BASIC METABOLIC PANEL
Anion gap: 6 (ref 5–15)
BUN: 46 mg/dL — ABNORMAL HIGH (ref 6–23)
CHLORIDE: 106 mmol/L (ref 96–112)
CO2: 28 mmol/L (ref 19–32)
CREATININE: 1.6 mg/dL — AB (ref 0.50–1.10)
Calcium: 8.6 mg/dL (ref 8.4–10.5)
GFR calc Af Amer: 39 mL/min — ABNORMAL LOW (ref >90)
GFR calc non Af Amer: 33 mL/min — ABNORMAL LOW (ref >90)
GLUCOSE: 114 mg/dL — AB (ref 70–99)
Potassium: 3.8 mmol/L (ref 3.5–5.1)
Sodium: 140 mmol/L (ref 135–145)

## 2014-08-15 LAB — GLUCOSE, CAPILLARY
GLUCOSE-CAPILLARY: 159 mg/dL — AB (ref 70–99)
Glucose-Capillary: 96 mg/dL (ref 70–99)
Glucose-Capillary: 182 mg/dL — ABNORMAL HIGH (ref 70–99)
Glucose-Capillary: 91 mg/dL (ref 70–99)

## 2014-08-15 MED ORDER — POTASSIUM CHLORIDE ER 10 MEQ PO TBCR
10.0000 meq | EXTENDED_RELEASE_TABLET | Freq: Three times a day (TID) | ORAL | Status: AC
  Administered 2014-08-15 (×3): 10 meq via ORAL
  Filled 2014-08-15 (×3): qty 1

## 2014-08-15 NOTE — Progress Notes (Signed)
Ref: [name], MD   Subjective:  Significant diuresis. Afebrile. Breathing better.  Objective:  Vital Signs in the last 24 hours: Temp:  [97.6 F (36.4 C)-97.9 F (36.6 C)] 97.9 F (36.6 C) (02/05 0800) Pulse Rate:  [60-69] 69 (02/05 0933) Cardiac Rhythm:  [-] Normal sinus rhythm (02/05 0800) Resp:  [16-18] 18 (02/05 0800) BP: (107-148)/(57-93) 127/57 mmHg (02/05 0933) SpO2:  [100 %] 100 % (02/05 0800) Weight:  [79.062 kg (174 lb 4.8 oz)] 79.062 kg (174 lb 4.8 oz) (02/05 0657)  Physical Exam: BP Readings from Last 1 Encounters:  08/15/14 127/57    Wt Readings from Last 1 Encounters:  08/15/14 79.062 kg (174 lb 4.8 oz)    Weight change: -0.338 kg (-11.9 oz)  HEENT: [place]/AT, Eyes-Brown, PERL, EOMI, Conjunctiva-Pink, Sclera-Non-icteric Neck: + JVD at 30 degree angle, No bruit, Trachea midline. Lungs:  Clearing, Bilateral. Cardiac:  Regular rhythm, normal S1 and S2, no S3. III/VI systolic murmur Abdomen:  Soft, non-tender.  Back-presacral edema 50 % resolved Extremities:  1 + edema present. No cyanosis. No clubbing. CNS: AxOx3, Cranial nerves grossly intact, moves all 4 extremities. Right handed. Skin: Warm and dry.   Intake/Output from previous day: 02/04 0701 - 02/05 0700 In: 1095.1 [P.O.:960; I.V.:135.1] Out: 5850 [Urine:5850]    Lab Results: BMET    Component Value Date/Time   NA 140 08/15/2014 0350   NA 139 08/14/2014 0500   NA 136 08/13/2014 0036   K 3.8 08/15/2014 0350   K 4.1 08/14/2014 0500   K 4.0 08/13/2014 0036   CL 106 08/15/2014 0350   CL 109 08/14/2014 0500   CL 106 08/13/2014 0036   CO2 28 08/15/2014 0350   CO2 21 08/14/2014 0500   CO2 22 08/13/2014 0036   GLUCOSE 114* 08/15/2014 0350   GLUCOSE 106* 08/14/2014 0500   GLUCOSE 163* 08/13/2014 0036   BUN 46* 08/15/2014 0350   BUN 52* 08/14/2014 0500   BUN 51* 08/13/2014 0036   CREATININE 1.60* 08/15/2014 0350   CREATININE 1.70* 08/14/2014 0500   CREATININE 1.63* 08/13/2014 0036   CALCIUM 8.6 08/15/2014 0350   CALCIUM 8.4 08/14/2014 0500   CALCIUM 8.1* 08/13/2014 0036   GFRNONAA 33* 08/15/2014 0350   GFRNONAA 31* 08/14/2014 0500   GFRNONAA 33* 08/13/2014 0036   GFRAA 39* 08/15/2014 0350   GFRAA 36* 08/14/2014 0500   GFRAA 38* 08/13/2014 0036   CBC    Component Value Date/Time   WBC 3.6* 08/12/2014 1403   RBC 5.74* 08/12/2014 1403   HGB 14.6 08/12/2014 1403   HCT 44.3 08/12/2014 1403   PLT 246 08/12/2014 1403   MCV 77.2* 08/12/2014 1403   MCH 25.4* 08/12/2014 1403   MCHC 33.0 08/12/2014 1403   RDW 16.9* 08/12/2014 1403   LYMPHSABS 1.1 08/12/2014 1403   MONOABS 0.3 08/12/2014 1403   EOSABS 0.0 08/12/2014 1403   BASOSABS 0.0 08/12/2014 1403   HEPATIC Function Panel  Recent Labs  06/23/14 0405 06/25/14 0520 08/12/14 1403  PROT 7.0 6.2 6.5   HEMOGLOBIN A1C No components found for: HGA1C,  MPG CARDIAC ENZYMES Lab Results  Component Value Date   CKTOTAL 59 09/07/2012   CKMB 3.2 09/07/2012   TROPONINI <0.03 08/13/2014   TROPONINI <0.03 08/12/2014   TROPONINI <0.03 08/12/2014   BNP  Recent Labs  04/02/14 0843 04/09/14 0451 06/19/14 1420  PROBNP 11219.0* 8206.0* 14027.0*   TSH  Recent Labs  02/03/14 1239  TSH 1.990   CHOLESTEROL No results for input(s): CHOL in  the last 8760 hours.  Scheduled Meds: . allopurinol  100 mg Oral BID  . amLODipine  5 mg Oral BID  . aspirin EC  81 mg Oral Daily  . carvedilol  3.125 mg Oral BID WC  . cloNIDine  0.1 mg Oral QHS  . digoxin  0.0625 mg Oral Daily  . furosemide  80 mg Intravenous BID  . heparin  5,000 Units Subcutaneous 3 times per day  . insulin aspart  0-9 Units Subcutaneous TID WC  . sodium chloride  3 mL Intravenous Q12H  . spironolactone  25 mg Oral QHS   Continuous Infusions: . DOBUTamine 5 mcg/kg/min (08/14/14 1400)   PRN Meds:.sodium chloride, acetaminophen, nitroGLYCERIN, ondansetron (ZOFRAN) IV, sodium chloride  Assessment/Plan: Acute on chronic Biventricular systolic  heart failure  CAD  S/P Stent in LAD  DM, II  Hypertension  Anxiety  Decrease dobutamine dose by 40 %.  Increase activity.   LOS: 3 days    Orpah CobbAjay Alayne Estrella  MD  08/15/2014, 11:05 AM

## 2014-08-16 LAB — BASIC METABOLIC PANEL
Anion gap: 6 (ref 5–15)
BUN: 41 mg/dL — ABNORMAL HIGH (ref 6–23)
CHLORIDE: 104 mmol/L (ref 96–112)
CO2: 27 mmol/L (ref 19–32)
Calcium: 8.2 mg/dL — ABNORMAL LOW (ref 8.4–10.5)
Creatinine, Ser: 1.45 mg/dL — ABNORMAL HIGH (ref 0.50–1.10)
GFR calc non Af Amer: 38 mL/min — ABNORMAL LOW (ref >90)
GFR, EST AFRICAN AMERICAN: 44 mL/min — AB (ref >90)
GLUCOSE: 108 mg/dL — AB (ref 70–99)
POTASSIUM: 4.4 mmol/L (ref 3.5–5.1)
SODIUM: 137 mmol/L (ref 135–145)

## 2014-08-16 LAB — GLUCOSE, CAPILLARY
GLUCOSE-CAPILLARY: 124 mg/dL — AB (ref 70–99)
Glucose-Capillary: 94 mg/dL (ref 70–99)
Glucose-Capillary: 110 mg/dL — ABNORMAL HIGH (ref 70–99)
Glucose-Capillary: 172 mg/dL — ABNORMAL HIGH (ref 70–99)

## 2014-08-16 NOTE — Progress Notes (Signed)
Subjective:  Patient denies any chest pain states breathing is improved.  Objective:  Vital Signs in the last 24 hours: Temp:  [97.8 F (36.6 C)-98.2 F (36.8 C)] 98.2 F (36.8 C) (02/06 0558) Pulse Rate:  [61-64] 64 (02/06 1003) Resp:  [16] 16 (02/06 0558) BP: (123-143)/(62-74) 123/63 mmHg (02/06 1003) SpO2:  [98 %-100 %] 100 % (02/06 0558) Weight:  [78.4 kg (172 lb 13.5 oz)] 78.4 kg (172 lb 13.5 oz) (02/06 0558)  Intake/Output from previous day: 02/05 0701 - 02/06 0700 In: 1250 [P.O.:1250] Out: 3500 [Urine:3500] Intake/Output from this shift: Total I/O In: 663 [P.O.:660; I.V.:3] Out: 1650 [Urine:1650]  Physical Exam: Neck: JVD - 8 cm above sternal notch, no adenopathy, no carotid bruit and supple, symmetrical, trachea midline Lungs: Decreased breath sound at bases with faint rales Heart: regular rate and rhythm, S1, S2 normal and 3/6 systolic murmur and S3 gallop noted Abdomen: soft, non-tender; bowel sounds normal; no masses,  no organomegaly Extremities: No clubbing cyanosis 3+ edema noted  Lab Results: No results for input(s): WBC, HGB, PLT in the last 72 hours.  Recent Labs  08/15/14 0350 08/16/14 0410  NA 140 137  K 3.8 4.4  CL 106 104  CO2 28 27  GLUCOSE 114* 108*  BUN 46* 41*  CREATININE 1.60* 1.45*   No results for input(s): TROPONINI in the last 72 hours.  Invalid input(s): CK, MB Hepatic Function Panel No results for input(s): PROT, ALBUMIN, AST, ALT, ALKPHOS, BILITOT, BILIDIR, IBILI in the last 72 hours. No results for input(s): CHOL in the last 72 hours. No results for input(s): PROTIME in the last 72 hours.  Imaging: Imaging results have been reviewed and No results found.  Cardiac Studies:  Assessment/Plan:  Resolving Acute on chronic Biventricular systolic heart failure  CAD  S/P Stent in LAD  DM, II  Hypertension  Anxiety Chronic kidney disease stage 3 improved Plan Continue present management Check labs in a.m.  LOS: 4 days     [name] N 08/16/2014, 1:24 PM

## 2014-08-17 LAB — GLUCOSE, CAPILLARY
GLUCOSE-CAPILLARY: 149 mg/dL — AB (ref 70–99)
Glucose-Capillary: 82 mg/dL (ref 70–99)
Glucose-Capillary: 134 mg/dL — ABNORMAL HIGH (ref 70–99)
Glucose-Capillary: 114 mg/dL — ABNORMAL HIGH (ref 70–99)

## 2014-08-17 LAB — CBC
HCT: 42 % (ref 36.0–46.0)
HEMOGLOBIN: 13.6 g/dL (ref 12.0–15.0)
MCH: 24.6 pg — ABNORMAL LOW (ref 26.0–34.0)
MCHC: 32.4 g/dL (ref 30.0–36.0)
MCV: 75.9 fL — ABNORMAL LOW (ref 78.0–100.0)
PLATELETS: 218 10*3/uL (ref 150–400)
RBC: 5.53 MIL/uL — AB (ref 3.87–5.11)
RDW: 16.6 % — ABNORMAL HIGH (ref 11.5–15.5)
WBC: 3.8 10*3/uL — ABNORMAL LOW (ref 4.0–10.5)

## 2014-08-17 LAB — BASIC METABOLIC PANEL
Anion gap: 7 (ref 5–15)
BUN: 39 mg/dL — ABNORMAL HIGH (ref 6–23)
CO2: 30 mmol/L (ref 19–32)
Calcium: 8.5 mg/dL (ref 8.4–10.5)
Chloride: 102 mmol/L (ref 96–112)
Creatinine, Ser: 1.45 mg/dL — ABNORMAL HIGH (ref 0.50–1.10)
GFR calc Af Amer: 44 mL/min — ABNORMAL LOW (ref >90)
GFR calc non Af Amer: 38 mL/min — ABNORMAL LOW (ref >90)
Glucose, Bld: 90 mg/dL (ref 70–99)
Potassium: 3.8 mmol/L (ref 3.5–5.1)
Sodium: 139 mmol/L (ref 135–145)

## 2014-08-17 LAB — BRAIN NATRIURETIC PEPTIDE: B Natriuretic Peptide: 1072.5 pg/mL — ABNORMAL HIGH (ref 0.0–100.0)

## 2014-08-17 NOTE — Progress Notes (Signed)
Subjective:  Doing well denies any chest pain states breathing has improved. Leg swelling trending down slowly. Lost approximately 18 pounds since admission  Objective:  Vital Signs in the last 24 hours: Temp:  [97.6 F (36.4 C)-98.3 F (36.8 C)] 97.6 F (36.4 C) (02/07 0510) Pulse Rate:  [63-70] 68 (02/07 0959) Resp:  [16-18] 18 (02/07 0510) BP: (139-146)/(65-78) 146/66 mmHg (02/07 0510) SpO2:  [100 %] 100 % (02/07 0510) Weight:  [71.033 kg (156 lb 9.6 oz)] 71.033 kg (156 lb 9.6 oz) (02/07 0510)  Intake/Output from previous day: 02/06 0701 - 02/07 0700 In: 1879.7 [P.O.:1730; I.V.:149.7] Out: 6125 [Urine:6125] Intake/Output from this shift: Total I/O In: 240 [P.O.:240] Out: -   Physical Exam: Neck: JVD - 8 cm above sternal notch, no adenopathy, no carotid bruit and supple, symmetrical, trachea midline Lungs: Decreased breath sound at bases with faint rales Heart: regular rate and rhythm, S1, S2 normal and 3/6 systolic murmur and S3 gallop noted Abdomen: soft, non-tender; bowel sounds normal; no masses,  no organomegaly Extremities: No clubbing cyanosis 3+ edema noted  Lab Results:  Recent Labs  08/17/14 0604  WBC 3.8*  HGB 13.6  PLT 218    Recent Labs  08/16/14 0410 08/17/14 0604  NA 137 139  K 4.4 3.8  CL 104 102  CO2 27 30  GLUCOSE 108* 90  BUN 41* 39*  CREATININE 1.45* 1.45*   No results for input(s): TROPONINI in the last 72 hours.  Invalid input(s): CK, MB Hepatic Function Panel No results for input(s): PROT, ALBUMIN, AST, ALT, ALKPHOS, BILITOT, BILIDIR, IBILI in the last 72 hours. No results for input(s): CHOL in the last 72 hours. No results for input(s): PROTIME in the last 72 hours.  Imaging: Imaging results have been reviewed and No results found.  Cardiac Studies:  Assessment/Plan:  Resolving Acute on chronic Biventricular systolic heart failure  CAD  S/P Stent in LAD  DM, II  Hypertension  Anxiety Chronic kidney disease stage 3  improved Plan Continue present management Strict I and os Check daily weights  LOS: 5 days    Kamran Coker N 08/17/2014, 10:57 AM

## 2014-08-17 NOTE — Plan of Care (Signed)
Problem: Phase I Progression Outcomes Goal: EF % per last Echo/documented,Core Reminder form on chart Outcome: Completed/Met Date Met:  08/17/14 EF 20-25%(06-19-14

## 2014-08-18 LAB — GLUCOSE, CAPILLARY: Glucose-Capillary: 97 mg/dL (ref 70–99)

## 2014-08-18 NOTE — Discharge Summary (Signed)
Physician Discharge Summary  Patient ID: [name] MRN: [id number] DOB/AGE: 07-25-1950 64 y.o.  Admit date: 08/12/2014 Discharge date: 08/18/2014  Admission Diagnoses: Acute on chronic Biventricular systolic heart failure  CAD  S/P Stent in LAD  DM, II  Hypertension  Anxiety  Discharge Diagnoses:  Principal Problem: * Acute on chronic Biventricular heart failure, NYHA class 2 * CAD  S/P Stent in LAD  DM, II  Hypertension  Anxiety CKD, II  Discharged Condition: fair  Hospital Course: 64 year old female presented with leg edema and progressive worsening of shortness of breath and 15 + pound weight gain x 2 weeks has past medical history of acute biventricular systolic heart failure, CAD, status post stent in LAD, diabetes mellitus type 2, hypertension and anxiety. No fever, cough or chest pain. She responded to lasix and IV dobutamine treatment with losing close to 20 pounds of weight. She was advised to drink half as much and see me in 1 week for follow up.  Consults: cardiology  Significant Diagnostic Studies: labs: Near normal CBC and elevated BUN/Cr to 50/1.72.Low albumin of 2.9 and Bilirubin to 1.5.  Chest X-ray increased small left effusion. New tiny right effusion. Chronic cardiomegaly.  Treatments: cardiac meds: carvedilol, amlodipine, digoxin, furosemide and Clonidine.  Discharge Exam: Blood pressure 144/69, pulse 64, temperature 98.2 F (36.8 C), temperature source Oral, resp. rate 20, height 5' (1.524 m), weight 69.219 kg (152 lb 9.6 oz), SpO2 100 %. HEENT: [place]/AT, Eyes-Brown, PERL, EOMI, Conjunctiva-Pink, Sclera-Non-icteric Neck: No JVD at 30 degree angle, No bruit, Trachea midline. Lungs: Clearing, Bilateral. Cardiac: Regular rhythm, normal S1 and S2, no S3. III/VI systolic murmur Abdomen: Soft, non-tender.  Back-presacral edema 90 % resolved Extremities: Trace edema present. No cyanosis. No clubbing. CNS: AxOx3, Cranial nerves grossly intact,  moves all 4 extremities. Right handed. Skin: Warm and dry.  Disposition: 01-Home or Self Care     Medication List    TAKE these medications        allopurinol 100 MG tablet  Commonly known as:  ZYLOPRIM  Take 100 mg by mouth 2 (two) times daily.     amLODipine 5 MG tablet  Commonly known as:  NORVASC  Take 5 mg by mouth 2 (two) times daily.     aspirin 81 MG EC tablet  Take 1 tablet (81 mg total) by mouth daily.     carvedilol 3.125 MG tablet  Commonly known as:  COREG  Take 1 tablet (3.125 mg total) by mouth 2 (two) times daily with a meal.     cloNIDine 0.2 MG tablet  Commonly known as:  CATAPRES  Take 0.1 mg by mouth as needed (for high blood pressure).     digoxin 0.125 MG tablet  Commonly known as:  LANOXIN  Take 0.5 tablets (0.0625 mg total) by mouth daily.     furosemide 80 MG tablet  Commonly known as:  LASIX  Take 80 mg by mouth 2 (two) times daily.     nitroGLYCERIN 0.4 MG SL tablet  Commonly known as:  NITROSTAT  Place 0.4 mg under the tongue every 5 (five) minutes as needed for chest pain.     potassium chloride 10 MEQ tablet  Commonly known as:  K-DUR  Take 10 mEq by mouth daily. 11/21/11 last dose.     spironolactone 25 MG tablet  Commonly known as:  ALDACTONE  Take 25 mg by mouth at bedtime.           Follow-up Information  Follow up with [facility] S, MD. Schedule an appointment as soon as possible for a visit in 1 week.   Specialty:  Cardiology   Contact information:   [address] [place] [address] [phone]       Signed: [name] 08/18/2014, 10:37 AM

## 2014-08-19 LAB — GLUCOSE, CAPILLARY: GLUCOSE-CAPILLARY: 165 mg/dL — AB (ref 70–99)

## 2014-09-22 ENCOUNTER — Encounter (HOSPITAL_COMMUNITY): Payer: Self-pay | Admitting: General Practice

## 2014-09-22 ENCOUNTER — Inpatient Hospital Stay (HOSPITAL_COMMUNITY)
Admission: AD | Admit: 2014-09-22 | Discharge: 2014-09-29 | DRG: 292 | Disposition: A | Discharge status: Home / Self Care | Payer: 59 | Source: Ambulatory Visit | Attending: Cardiovascular Disease | Admitting: Cardiovascular Disease

## 2014-09-22 DIAGNOSIS — Z955 Presence of coronary angioplasty implant and graft: Secondary | ICD-10-CM

## 2014-09-22 DIAGNOSIS — N182 Chronic kidney disease, stage 2 (mild): Secondary | ICD-10-CM | POA: Diagnosis present

## 2014-09-22 DIAGNOSIS — Z87891 Personal history of nicotine dependence: Secondary | ICD-10-CM | POA: Diagnosis not present

## 2014-09-22 DIAGNOSIS — I272 Other secondary pulmonary hypertension: Secondary | ICD-10-CM | POA: Diagnosis present

## 2014-09-22 DIAGNOSIS — I420 Dilated cardiomyopathy: Secondary | ICD-10-CM | POA: Diagnosis present

## 2014-09-22 DIAGNOSIS — I2510 Atherosclerotic heart disease of native coronary artery without angina pectoris: Secondary | ICD-10-CM | POA: Diagnosis present

## 2014-09-22 DIAGNOSIS — F419 Anxiety disorder, unspecified: Secondary | ICD-10-CM | POA: Diagnosis present

## 2014-09-22 DIAGNOSIS — I5023 Acute on chronic systolic (congestive) heart failure: Principal | ICD-10-CM | POA: Diagnosis present

## 2014-09-22 DIAGNOSIS — I129 Hypertensive chronic kidney disease with stage 1 through stage 4 chronic kidney disease, or unspecified chronic kidney disease: Secondary | ICD-10-CM | POA: Diagnosis present

## 2014-09-22 DIAGNOSIS — Z7982 Long term (current) use of aspirin: Secondary | ICD-10-CM

## 2014-09-22 DIAGNOSIS — I509 Heart failure, unspecified: Secondary | ICD-10-CM

## 2014-09-22 DIAGNOSIS — R0602 Shortness of breath: Secondary | ICD-10-CM | POA: Diagnosis present

## 2014-09-22 DIAGNOSIS — E119 Type 2 diabetes mellitus without complications: Secondary | ICD-10-CM | POA: Diagnosis present

## 2014-09-22 DIAGNOSIS — I10 Essential (primary) hypertension: Secondary | ICD-10-CM | POA: Diagnosis present

## 2014-09-22 DIAGNOSIS — I5082 Biventricular heart failure: Secondary | ICD-10-CM

## 2014-09-22 DIAGNOSIS — E1129 Type 2 diabetes mellitus with other diabetic kidney complication: Secondary | ICD-10-CM

## 2014-09-22 HISTORY — DX: Gastro-esophageal reflux disease without esophagitis: K21.9

## 2014-09-22 HISTORY — DX: Pure hypercholesterolemia, unspecified: E78.00

## 2014-09-22 HISTORY — DX: Gout, unspecified: M10.9

## 2014-09-22 LAB — CBC WITH DIFFERENTIAL/PLATELET
Basophils Absolute: 0 10*3/uL (ref 0.0–0.1)
Basophils Relative: 1 % (ref 0–1)
Eosinophils Absolute: 0.1 10*3/uL (ref 0.0–0.7)
Eosinophils Relative: 2 % (ref 0–5)
HEMATOCRIT: 42.2 % (ref 36.0–46.0)
Hemoglobin: 13.6 g/dL (ref 12.0–15.0)
LYMPHS ABS: 1.1 10*3/uL (ref 0.7–4.0)
LYMPHS PCT: 26 % (ref 12–46)
MCH: 25 pg — ABNORMAL LOW (ref 26.0–34.0)
MCHC: 32.2 g/dL (ref 30.0–36.0)
MCV: 77.4 fL — ABNORMAL LOW (ref 78.0–100.0)
Monocytes Absolute: 0.3 10*3/uL (ref 0.1–1.0)
Monocytes Relative: 7 % (ref 3–12)
NEUTROS ABS: 2.7 10*3/uL (ref 1.7–7.7)
Neutrophils Relative %: 64 % (ref 43–77)
PLATELETS: 211 10*3/uL (ref 150–400)
RBC: 5.45 MIL/uL — ABNORMAL HIGH (ref 3.87–5.11)
RDW: 20 % — AB (ref 11.5–15.5)
WBC: 4.2 10*3/uL (ref 4.0–10.5)

## 2014-09-22 LAB — COMPREHENSIVE METABOLIC PANEL
ALK PHOS: 81 U/L (ref 39–117)
ALT: 12 U/L (ref 0–35)
ANION GAP: 11 (ref 5–15)
AST: 17 U/L (ref 0–37)
Albumin: 2.8 g/dL — ABNORMAL LOW (ref 3.5–5.2)
BUN: 48 mg/dL — ABNORMAL HIGH (ref 6–23)
CHLORIDE: 107 mmol/L (ref 96–112)
CO2: 21 mmol/L (ref 19–32)
Calcium: 8.8 mg/dL (ref 8.4–10.5)
Creatinine, Ser: 1.57 mg/dL — ABNORMAL HIGH (ref 0.50–1.10)
GFR calc non Af Amer: 34 mL/min — ABNORMAL LOW (ref >90)
GFR, EST AFRICAN AMERICAN: 40 mL/min — AB (ref >90)
Glucose, Bld: 194 mg/dL — ABNORMAL HIGH (ref 70–99)
Potassium: 3.6 mmol/L (ref 3.5–5.1)
SODIUM: 139 mmol/L (ref 135–145)
TOTAL PROTEIN: 6.9 g/dL (ref 6.0–8.3)
Total Bilirubin: 1.9 mg/dL — ABNORMAL HIGH (ref 0.3–1.2)

## 2014-09-22 LAB — GLUCOSE, CAPILLARY
GLUCOSE-CAPILLARY: 177 mg/dL — AB (ref 70–99)
Glucose-Capillary: 137 mg/dL — ABNORMAL HIGH (ref 70–99)
Glucose-Capillary: 110 mg/dL — ABNORMAL HIGH (ref 70–99)

## 2014-09-22 LAB — TROPONIN I

## 2014-09-22 LAB — BRAIN NATRIURETIC PEPTIDE: B Natriuretic Peptide: 1536.8 pg/mL — ABNORMAL HIGH (ref 0.0–100.0)

## 2014-09-22 LAB — DIGOXIN LEVEL: Digoxin Level: <0.2 ng/mL — ABNORMAL LOW (ref 0.8–2.0)

## 2014-09-22 MED ORDER — ASPIRIN EC 81 MG PO TBEC
81.0000 mg | DELAYED_RELEASE_TABLET | Freq: Every day | ORAL | Status: DC
  Administered 2014-09-22 – 2014-09-29 (×8): 81 mg via ORAL
  Filled 2014-09-22 (×8): qty 1

## 2014-09-22 MED ORDER — NITROGLYCERIN 0.4 MG SL SUBL
0.4000 mg | SUBLINGUAL_TABLET | SUBLINGUAL | Status: DC | PRN
Start: 2014-09-22 — End: 2014-09-29

## 2014-09-22 MED ORDER — CARVEDILOL 3.125 MG PO TABS
3.1250 mg | ORAL_TABLET | Freq: Two times a day (BID) | ORAL | Status: DC
Start: 2014-09-22 — End: 2014-09-29
  Administered 2014-09-22 – 2014-09-29 (×14): 3.125 mg via ORAL
  Filled 2014-09-22 (×19): qty 1

## 2014-09-22 MED ORDER — DIGOXIN 0.0625 MG HALF TABLET
0.0625 mg | ORAL_TABLET | Freq: Every day | ORAL | Status: DC
  Administered 2014-09-22 – 2014-09-29 (×8): 0.0625 mg via ORAL
  Filled 2014-09-22 (×9): qty 1

## 2014-09-22 MED ORDER — SPIRONOLACTONE 25 MG PO TABS
25.0000 mg | ORAL_TABLET | Freq: Every day | ORAL | Status: DC
  Administered 2014-09-22 – 2014-09-28 (×7): 25 mg via ORAL
  Filled 2014-09-22 (×8): qty 1

## 2014-09-22 MED ORDER — FUROSEMIDE 10 MG/ML IJ SOLN
40.0000 mg | Freq: Two times a day (BID) | INTRAMUSCULAR | Status: DC
  Administered 2014-09-22 – 2014-09-23 (×4): 40 mg via INTRAVENOUS
  Filled 2014-09-22 (×5): qty 4

## 2014-09-22 MED ORDER — LINAGLIPTIN 5 MG PO TABS
5.0000 mg | ORAL_TABLET | Freq: Every day | ORAL | Status: DC
Start: 2014-09-22 — End: 2014-09-29
  Administered 2014-09-22 – 2014-09-29 (×8): 5 mg via ORAL
  Filled 2014-09-22 (×9): qty 1

## 2014-09-22 MED ORDER — ACETAMINOPHEN 325 MG PO TABS: 650.0000 mg | ORAL_TABLET | ORAL | Status: DC | PRN

## 2014-09-22 MED ORDER — ONDANSETRON HCL 4 MG/2ML IJ SOLN: 4.0000 mg | Freq: Four times a day (QID) | INTRAMUSCULAR | Status: DC | PRN

## 2014-09-22 MED ORDER — SODIUM CHLORIDE 0.9 % IJ SOLN
3.0000 mL | Freq: Two times a day (BID) | INTRAMUSCULAR | Status: DC
  Administered 2014-09-22 – 2014-09-29 (×15): 3 mL via INTRAVENOUS

## 2014-09-22 MED ORDER — CLONIDINE HCL 0.1 MG PO TABS
0.1000 mg | ORAL_TABLET | ORAL | Status: DC | PRN
  Filled 2014-09-22: qty 1

## 2014-09-22 MED ORDER — SODIUM CHLORIDE 0.9 % IJ SOLN: 3.0000 mL | INTRAMUSCULAR | Status: DC | PRN

## 2014-09-22 MED ORDER — AMLODIPINE BESYLATE 5 MG PO TABS
5.0000 mg | ORAL_TABLET | Freq: Two times a day (BID) | ORAL | Status: DC
  Administered 2014-09-22 – 2014-09-29 (×15): 5 mg via ORAL
  Filled 2014-09-22 (×16): qty 1

## 2014-09-22 MED ORDER — ALLOPURINOL 100 MG PO TABS
100.0000 mg | ORAL_TABLET | Freq: Two times a day (BID) | ORAL | Status: DC
  Administered 2014-09-22 – 2014-09-29 (×15): 100 mg via ORAL
  Filled 2014-09-22 (×16): qty 1

## 2014-09-22 MED ORDER — SODIUM CHLORIDE 0.9 % IV SOLN: 250.0000 mL | INTRAVENOUS | Status: DC | PRN

## 2014-09-22 MED ORDER — INSULIN ASPART 100 UNIT/ML ~~LOC~~ SOLN
0.0000 [IU] | Freq: Three times a day (TID) | SUBCUTANEOUS | Status: DC
  Administered 2014-09-22: 2 [IU] via SUBCUTANEOUS
  Administered 2014-09-23 – 2014-09-24 (×2): 3 [IU] via SUBCUTANEOUS
  Administered 2014-09-25: 2 [IU] via SUBCUTANEOUS
  Administered 2014-09-26: 3 [IU] via SUBCUTANEOUS
  Administered 2014-09-27: 5 [IU] via SUBCUTANEOUS
  Administered 2014-09-28 – 2014-09-29 (×2): 2 [IU] via SUBCUTANEOUS

## 2014-09-22 MED ORDER — HEPARIN SODIUM (PORCINE) 5000 UNIT/ML IJ SOLN
5000.0000 [IU] | Freq: Three times a day (TID) | INTRAMUSCULAR | Status: DC
  Administered 2014-09-22 – 2014-09-27 (×2): 5000 [IU] via SUBCUTANEOUS
  Filled 2014-09-22 (×21): qty 1

## 2014-09-22 MED ORDER — POTASSIUM CHLORIDE ER 10 MEQ PO TBCR
10.0000 meq | EXTENDED_RELEASE_TABLET | Freq: Every day | ORAL | Status: DC
  Administered 2014-09-22 – 2014-09-29 (×8): 10 meq via ORAL
  Filled 2014-09-22 (×8): qty 1

## 2014-09-22 NOTE — H&P (Signed)
Referring Physician:  NYARI OLSSON is an 64 y.o. female.                       Chief Complaint: Leg edema and shortness of breath.  HPI: 64 year old female with leg edema and progressive worsening of shortness of breath and 10 pound weight gain x 1-2 weeks has past medical history of acute biventricular systolic heart failure, CAD, status post stent in LAD, diabetes mellitus type 2, hypertension and anxiety. No fever or chest pain.   Past Medical History  Diagnosis Date  . Hypertension   . Diabetes mellitus   . Heart failure   . Coronary artery disease   . Stented coronary artery   . CHF (congestive heart failure)   . Shortness of breath dyspnea       Past Surgical History  Procedure Laterality Date  . Left and right heart catheterization with coronary angiogram N/A 11/25/2011    Procedure: LEFT AND RIGHT HEART CATHETERIZATION WITH CORONARY ANGIOGRAM;  Surgeon: Robynn Pane, MD;  Location: [facility] CATH LAB;  Service: Cardiovascular;  Laterality: N/A;  . Percutaneous coronary stent intervention (pci-s) Right 11/25/2011    Procedure: PERCUTANEOUS CORONARY STENT INTERVENTION (PCI-S);  Surgeon: Robynn Pane, MD;  Location: [facility] CATH LAB;  Service: Cardiovascular;  Laterality: Right;  . Left and right heart catheterization with coronary angiogram N/A 09/07/2012    Procedure: LEFT AND RIGHT HEART CATHETERIZATION WITH CORONARY ANGIOGRAM;  Surgeon: [name], MD;  Location: [facility] CATH LAB;  Service: Cardiovascular;  Laterality: N/A;    No family history on file. Social History:  reports that she has quit smoking. She has never used smokeless tobacco. She reports that she does not drink alcohol or use illicit drugs.  Allergies: No Known Allergies  Medications Prior to Admission  Medication Sig Dispense Refill  . allopurinol (ZYLOPRIM) 100 MG tablet Take 100 mg by mouth 2 (two) times daily.    Marland Kitchen amLODipine (NORVASC) 5 MG tablet Take 5 mg by mouth 2 (two) times daily.    Marland Kitchen aspirin EC 81  MG EC tablet Take 1 tablet (81 mg total) by mouth daily.    . carvedilol (COREG) 3.125 MG tablet Take 1 tablet (3.125 mg total) by mouth 2 (two) times daily with a meal. 60 tablet 6  . cloNIDine (CATAPRES) 0.2 MG tablet Take 0.1 mg by mouth as needed (for high blood pressure).     Marland Kitchen digoxin (LANOXIN) 0.125 MG tablet Take 0.5 tablets (0.0625 mg total) by mouth daily. 15 tablet 1  . furosemide (LASIX) 80 MG tablet Take 80 mg by mouth 2 (two) times daily.    . nitroGLYCERIN (NITROSTAT) 0.4 MG SL tablet Place 0.4 mg under the tongue every 5 (five) minutes as needed for chest pain.    . potassium chloride (K-DUR) 10 MEQ tablet Take 10 mEq by mouth daily. 11/21/11 last dose.    . spironolactone (ALDACTONE) 25 MG tablet Take 25 mg by mouth at bedtime.      No results found for this or any previous visit (from the past 48 hour(s)). No results found.  Review Of Systems + wears glasses, + weight gain + partial dentures, + COPD, + angina, + leg edema, + hypertension, + DM, II, + CHF, No CVA, No seizures, No kidney stone, + arthritis.  Blood pressure 165/94, pulse 89, temperature 97.3 F (36.3 C), temperature source Oral, height  (1.651 m), weight 75.615 kg (166 lb 11.2  oz), SpO2 96 %. General: Averagely built and nourished in mild respiratory distress. HEENT: [place]/AT, brown eyes, PERL, EOMI. conjuntiva-pink, sclera-white. Neck: + JVD, no bruit, no thyromegaly.  Lungs: Bilateral basal crackles.  Heart: Normal S1 and S2. Grade III/VI systolic murmur LSB  Abdomen: Mild swelling, non-tender.  Ext: 2 + edema up to thighs, bilaterally.  CNS: Cranial nerves grossly intact. Moves all 4 extremities. Skin: Warm and dry.  Assessment/Plan Acute Biventricular systolic heart failure  CAD  S/P Stent in LAD  DM, II  Hypertension  Anxiety  Admit/IV lasix/Home medications  Rafik Koppel S, MD  09/22/2014, 10:43 AM

## 2014-09-23 ENCOUNTER — Inpatient Hospital Stay (HOSPITAL_COMMUNITY): Payer: 59

## 2014-09-23 LAB — BASIC METABOLIC PANEL
ANION GAP: 8 (ref 5–15)
BUN: 48 mg/dL — ABNORMAL HIGH (ref 6–23)
CHLORIDE: 107 mmol/L (ref 96–112)
CO2: 25 mmol/L (ref 19–32)
Calcium: 8.6 mg/dL (ref 8.4–10.5)
Creatinine, Ser: 1.52 mg/dL — ABNORMAL HIGH (ref 0.50–1.10)
GFR, EST AFRICAN AMERICAN: 41 mL/min — AB (ref >90)
GFR, EST NON AFRICAN AMERICAN: 36 mL/min — AB (ref >90)
Glucose, Bld: 117 mg/dL — ABNORMAL HIGH (ref 70–99)
Potassium: 3.9 mmol/L (ref 3.5–5.1)
SODIUM: 140 mmol/L (ref 135–145)

## 2014-09-23 LAB — GLUCOSE, CAPILLARY
GLUCOSE-CAPILLARY: 103 mg/dL — AB (ref 70–99)
GLUCOSE-CAPILLARY: 115 mg/dL — AB (ref 70–99)
Glucose-Capillary: 162 mg/dL — ABNORMAL HIGH (ref 70–99)
Glucose-Capillary: 140 mg/dL — ABNORMAL HIGH (ref 70–99)

## 2014-09-23 LAB — TROPONIN I: Troponin I: <0.03 ng/mL (ref <0.031)

## 2014-09-23 NOTE — Progress Notes (Signed)
Echocardiogram 2D Echocardiogram has been performed.  [name] 09/23/2014, 2:46 PM

## 2014-09-24 LAB — HEMOGLOBIN A1C
Hgb A1c MFr Bld: 8 % — ABNORMAL HIGH (ref 4.8–5.6)
Mean Plasma Glucose: 183 mg/dL

## 2014-09-24 LAB — GLUCOSE, CAPILLARY
GLUCOSE-CAPILLARY: 108 mg/dL — AB (ref 70–99)
GLUCOSE-CAPILLARY: 160 mg/dL — AB (ref 70–99)
GLUCOSE-CAPILLARY: 99 mg/dL (ref 70–99)
GLUCOSE-CAPILLARY: 133 mg/dL — AB (ref 70–99)

## 2014-09-24 LAB — BASIC METABOLIC PANEL
ANION GAP: 7 (ref 5–15)
BUN: 47 mg/dL — ABNORMAL HIGH (ref 6–23)
CHLORIDE: 107 mmol/L (ref 96–112)
CO2: 24 mmol/L (ref 19–32)
Calcium: 8.7 mg/dL (ref 8.4–10.5)
Creatinine, Ser: 1.4 mg/dL — ABNORMAL HIGH (ref 0.50–1.10)
GFR calc Af Amer: 46 mL/min — ABNORMAL LOW (ref >90)
GFR calc non Af Amer: 39 mL/min — ABNORMAL LOW (ref >90)
Glucose, Bld: 110 mg/dL — ABNORMAL HIGH (ref 70–99)
Potassium: 4.1 mmol/L (ref 3.5–5.1)
Sodium: 138 mmol/L (ref 135–145)

## 2014-09-24 MED ORDER — FUROSEMIDE 10 MG/ML IJ SOLN
80.0000 mg | Freq: Two times a day (BID) | INTRAMUSCULAR | Status: DC
  Administered 2014-09-24 – 2014-09-29 (×11): 80 mg via INTRAVENOUS
  Filled 2014-09-24 (×12): qty 8

## 2014-09-24 NOTE — Progress Notes (Signed)
Ref: Tanya RodriguezKADAKIA,[name] S, MD   Subjective:  Afebrile. Decreasing shortness of breath. No chest pain. Echocardiogram with biventricular failure, Severe MR and TR and mild AI. EF 35-40 %  Objective:  Vital Signs in the last 24 hours: Temp:  [97.4 F (36.3 C)-98.1 F (36.7 C)] 98.1 F (36.7 C) (03/16 0456) Pulse Rate:  [64-70] 70 (03/16 0847) Cardiac Rhythm:  [-] Normal sinus rhythm (03/16 0810) Resp:  [18] 18 (03/16 0456) BP: (117-152)/(66-81) 117/78 mmHg (03/16 0847) SpO2:  [99 %-100 %] 99 % (03/16 0456) Weight:  [76.159 kg (167 lb 14.4 oz)] 76.159 kg (167 lb 14.4 oz) (03/16 0456)  Physical Exam: BP Readings from Last 1 Encounters:  09/24/14 117/78    Wt Readings from Last 1 Encounters:  09/24/14 76.159 kg (167 lb 14.4 oz)    Weight change: 0.544 kg (1 lb 3.2 oz)  HEENT: [place]/AT, Eyes-Brown, PERL, EOMI, Conjunctiva-Pink, Sclera-Non-icteric Neck: ++ JVD, No bruit, Trachea midline. Lungs:  Clear, Bilateral. Cardiac:  Regular rhythm, normal S1 and S2, no S3. III/VI systolic and Ii/VI diastolic murmur. Abdomen:  Soft, non-tender. Extremities:  1 + edema of lower legs and presacral area present. No cyanosis. No clubbing. CNS: AxOx3, Cranial nerves grossly intact, moves all 4 extremities. Right handed. Skin: Warm and dry.   Intake/Output from previous day: 03/15 0701 - 03/16 0700 In: 1000 [P.O.:1000] Out: 1200 [Urine:1200]    Lab Results: BMET    Component Value Date/Time   NA 138 09/24/2014 0529   NA 140 09/23/2014 0505   NA 139 09/22/2014 1202   K 4.1 09/24/2014 0529   K 3.9 09/23/2014 0505   K 3.6 09/22/2014 1202   CL 107 09/24/2014 0529   CL 107 09/23/2014 0505   CL 107 09/22/2014 1202   CO2 24 09/24/2014 0529   CO2 25 09/23/2014 0505   CO2 21 09/22/2014 1202   GLUCOSE 110* 09/24/2014 0529   GLUCOSE 117* 09/23/2014 0505   GLUCOSE 194* 09/22/2014 1202   BUN 47* 09/24/2014 0529   BUN 48* 09/23/2014 0505   BUN 48* 09/22/2014 1202   CREATININE 1.40* 09/24/2014 0529    CREATININE 1.52* 09/23/2014 0505   CREATININE 1.57* 09/22/2014 1202   CALCIUM 8.7 09/24/2014 0529   CALCIUM 8.6 09/23/2014 0505   CALCIUM 8.8 09/22/2014 1202   GFRNONAA 39* 09/24/2014 0529   GFRNONAA 36* 09/23/2014 0505   GFRNONAA 34* 09/22/2014 1202   GFRAA 46* 09/24/2014 0529   GFRAA 41* 09/23/2014 0505   GFRAA 40* 09/22/2014 1202   CBC    Component Value Date/Time   WBC 4.2 09/22/2014 1202   RBC 5.45* 09/22/2014 1202   HGB 13.6 09/22/2014 1202   HCT 42.2 09/22/2014 1202   PLT 211 09/22/2014 1202   MCV 77.4* 09/22/2014 1202   MCH 25.0* 09/22/2014 1202   MCHC 32.2 09/22/2014 1202   RDW 20.0* 09/22/2014 1202   LYMPHSABS 1.1 09/22/2014 1202   MONOABS 0.3 09/22/2014 1202   EOSABS 0.1 09/22/2014 1202   BASOSABS 0.0 09/22/2014 1202   HEPATIC Function Panel  Recent Labs  06/25/14 0520 08/12/14 1403 09/22/14 1202  PROT 6.2 6.5 6.9   HEMOGLOBIN A1C No components found for: HGA1C,  MPG CARDIAC ENZYMES Lab Results  Component Value Date   CKTOTAL 59 09/07/2012   CKMB 3.2 09/07/2012   TROPONINI <0.03 09/22/2014   TROPONINI <0.03 09/22/2014   TROPONINI <0.03 09/22/2014   BNP  Recent Labs  04/02/14 0843 04/09/14 0451 06/19/14 1420  PROBNP 11219.0* 8206.0* 14027.0*  TSH  Recent Labs  02/03/14 1239  TSH 1.990   CHOLESTEROL No results for input(s): CHOL in the last 8760 hours.  Scheduled Meds: . allopurinol  100 mg Oral BID  . amLODipine  5 mg Oral BID  . aspirin EC  81 mg Oral Daily  . carvedilol  3.125 mg Oral BID WC  . digoxin  0.0625 mg Oral Daily  . furosemide  40 mg Intravenous Q12H  . heparin  5,000 Units Subcutaneous 3 times per day  . insulin aspart  0-15 Units Subcutaneous TID WC  . linagliptin  5 mg Oral Daily  . potassium chloride  10 mEq Oral Daily  . sodium chloride  3 mL Intravenous Q12H  . spironolactone  25 mg Oral QHS   Continuous Infusions:  PRN Meds:.sodium chloride, acetaminophen, cloNIDine, nitroGLYCERIN, ondansetron  (ZOFRAN) IV, sodium chloride  Assessment/Plan: Acute on chronic biventricular systolic heart failure  CAD  S/P Stent in LAD  DM, II  Hypertension  Anxiety  Increase lasix to 80 mg.     LOS: 2 days    [name]  MD  09/24/2014, 9:54 AM

## 2014-09-24 NOTE — Progress Notes (Signed)
Late entry Ref: Tanya Rodriguez, MD   Subjective:  Feeling little better. Some diuresis with IV lasix.  Objective:  Vital Signs in the last 24 hours: Temp:  [97.4 F (36.3 C)-98.1 F (36.7 C)] 98.1 F (36.7 C) (03/16 0456) Pulse Rate:  [64-70] 70 (03/16 0847) Cardiac Rhythm:  [-] Normal sinus rhythm (03/16 0810) Resp:  [18] 18 (03/16 0456) BP: (117-152)/(66-81) 117/78 mmHg (03/16 0847) SpO2:  [99 %-100 %] 99 % (03/16 0456) Weight:  [76.159 kg (167 lb 14.4 oz)] 76.159 kg (167 lb 14.4 oz) (03/16 0456)  Physical Exam: BP Readings from Last 1 Encounters:  09/24/14 117/78    Wt Readings from Last 1 Encounters:  09/24/14 76.159 kg (167 lb 14.4 oz)    Weight change: 0.544 kg (1 lb 3.2 oz)  HEENT: [place]/AT, Eyes-Brown, PERL, EOMI, Conjunctiva-Pink, Sclera-Non-icteric Neck: Full JVD, No bruit, Trachea midline. Lungs:  Clearing, Bilateral. Cardiac:  Regular rhythm, normal S1 and S2, no S3. III/VI systolic and II/VI diastolic murmur. Abdomen:  Soft, non-tender. Extremities:  1 + edema present. No cyanosis. No clubbing. 1+ presacral edema. CNS: AxOx3, Cranial nerves grossly intact, moves all 4 extremities. Right handed. Skin: Warm and dry.   Intake/Output from previous day: 03/15 0701 - 03/16 0700 In: 1000 [P.O.:1000] Out: 1200 [Urine:1200]    Lab Results: BMET    Component Value Date/Time   NA 138 09/24/2014 0529   NA 140 09/23/2014 0505   NA 139 09/22/2014 1202   K 4.1 09/24/2014 0529   K 3.9 09/23/2014 0505   K 3.6 09/22/2014 1202   CL 107 09/24/2014 0529   CL 107 09/23/2014 0505   CL 107 09/22/2014 1202   CO2 24 09/24/2014 0529   CO2 25 09/23/2014 0505   CO2 21 09/22/2014 1202   GLUCOSE 110* 09/24/2014 0529   GLUCOSE 117* 09/23/2014 0505   GLUCOSE 194* 09/22/2014 1202   BUN 47* 09/24/2014 0529   BUN 48* 09/23/2014 0505   BUN 48* 09/22/2014 1202   CREATININE 1.40* 09/24/2014 0529   CREATININE 1.52* 09/23/2014 0505   CREATININE 1.57* 09/22/2014 1202   CALCIUM 8.7  09/24/2014 0529   CALCIUM 8.6 09/23/2014 0505   CALCIUM 8.8 09/22/2014 1202   GFRNONAA 39* 09/24/2014 0529   GFRNONAA 36* 09/23/2014 0505   GFRNONAA 34* 09/22/2014 1202   GFRAA 46* 09/24/2014 0529   GFRAA 41* 09/23/2014 0505   GFRAA 40* 09/22/2014 1202   CBC    Component Value Date/Time   WBC 4.2 09/22/2014 1202   RBC 5.45* 09/22/2014 1202   HGB 13.6 09/22/2014 1202   HCT 42.2 09/22/2014 1202   PLT 211 09/22/2014 1202   MCV 77.4* 09/22/2014 1202   MCH 25.0* 09/22/2014 1202   MCHC 32.2 09/22/2014 1202   RDW 20.0* 09/22/2014 1202   LYMPHSABS 1.1 09/22/2014 1202   MONOABS 0.3 09/22/2014 1202   EOSABS 0.1 09/22/2014 1202   BASOSABS 0.0 09/22/2014 1202   HEPATIC Function Panel  Recent Labs  06/25/14 0520 08/12/14 1403 09/22/14 1202  PROT 6.2 6.5 6.9   HEMOGLOBIN A1C No components found for: HGA1C,  MPG CARDIAC ENZYMES Lab Results  Component Value Date   CKTOTAL 59 09/07/2012   CKMB 3.2 09/07/2012   TROPONINI <0.03 09/22/2014   TROPONINI <0.03 09/22/2014   TROPONINI <0.03 09/22/2014   BNP  Recent Labs  04/02/14 0843 04/09/14 0451 06/19/14 1420  PROBNP 11219.0* 8206.0* 14027.0*   TSH  Recent Labs  02/03/14 1239  TSH 1.990   CHOLESTEROL No results  for input(s): CHOL in the last 8760 hours.  Scheduled Meds: . allopurinol  100 mg Oral BID  . amLODipine  5 mg Oral BID  . aspirin EC  81 mg Oral Daily  . carvedilol  3.125 mg Oral BID WC  . digoxin  0.0625 mg Oral Daily  . furosemide  40 mg Intravenous Q12H  . heparin  5,000 Units Subcutaneous 3 times per day  . insulin aspart  0-15 Units Subcutaneous TID WC  . linagliptin  5 mg Oral Daily  . potassium chloride  10 mEq Oral Daily  . sodium chloride  3 mL Intravenous Q12H  . spironolactone  25 mg Oral QHS   Continuous Infusions:  PRN Meds:.sodium chloride, acetaminophen, cloNIDine, nitroGLYCERIN, ondansetron (ZOFRAN) IV, sodium chloride  Assessment/Plan: Acute on chronic biventricular systolic  heart failure  CAD  S/P Stent in LAD  DM, II  Hypertension  Anxiety  Apply ted hose. Continue lasix.     LOS: 2 days    Tanya CobbAjay Cariah Salatino  MD  09/24/2014, 9:50 AM

## 2014-09-25 LAB — BASIC METABOLIC PANEL
Anion gap: 12 (ref 5–15)
BUN: 47 mg/dL — ABNORMAL HIGH (ref 6–23)
CHLORIDE: 104 mmol/L (ref 96–112)
CO2: 21 mmol/L (ref 19–32)
Calcium: 8.7 mg/dL (ref 8.4–10.5)
Creatinine, Ser: 1.5 mg/dL — ABNORMAL HIGH (ref 0.50–1.10)
GFR calc Af Amer: 42 mL/min — ABNORMAL LOW (ref >90)
GFR calc non Af Amer: 36 mL/min — ABNORMAL LOW (ref >90)
Glucose, Bld: 91 mg/dL (ref 70–99)
Potassium: 4 mmol/L (ref 3.5–5.1)
SODIUM: 137 mmol/L (ref 135–145)

## 2014-09-25 LAB — GLUCOSE, CAPILLARY
GLUCOSE-CAPILLARY: 150 mg/dL — AB (ref 70–99)
GLUCOSE-CAPILLARY: 149 mg/dL — AB (ref 70–99)
Glucose-Capillary: 90 mg/dL (ref 70–99)
Glucose-Capillary: 112 mg/dL — ABNORMAL HIGH (ref 70–99)

## 2014-09-25 NOTE — Progress Notes (Signed)
Ref: Tanya Rodriguez, MD   Subjective:  Afebrile and breathing better. Patient understood to take care of her self before helping others.  Objective:  Vital Signs in the last 24 hours: Temp:  [97.5 F (36.4 C)-98 F (36.7 C)] 97.5 F (36.4 C) (03/17 0441) Pulse Rate:  [64-70] 66 (03/17 0441) Cardiac Rhythm:  [-] Normal sinus rhythm (03/17 1144) Resp:  [16-19] 19 (03/17 0441) BP: (129-146)/(71-80) 132/71 mmHg (03/17 0441) SpO2:  [99 %-100 %] 99 % (03/17 0441) Weight:  [73.7 kg (162 lb 7.7 oz)] 73.7 kg (162 lb 7.7 oz) (03/17 0441)  Physical Exam: BP Readings from Last 1 Encounters:  09/25/14 132/71    Wt Readings from Last 1 Encounters:  09/25/14 73.7 kg (162 lb 7.7 oz)    Weight change: -2.459 kg (-5 lb 6.7 oz)  HEENT: [place]/AT, Eyes-Brown, PERL, EOMI, Conjunctiva-Pink, Sclera-Non-icteric Neck: + JVD, No bruit, Trachea midline. Lungs:  Clear, Bilateral. Cardiac:  Regular rhythm, normal S1 and S2, no S3.  Abdomen:  Soft, non-tender. Extremities:  Trace to 1 + edema of lower leg, left more than right.. No cyanosis. No clubbing. CNS: AxOx3, Cranial nerves grossly intact, moves all 4 extremities. Right handed. Skin: Warm and dry.   Intake/Output from previous day: 03/16 0701 - 03/17 0700 In: 1040 [P.O.:1040] Out: 2550 [Urine:2550]    Lab Results: BMET    Component Value Date/Time   NA 137 09/25/2014 0554   NA 138 09/24/2014 0529   NA 140 09/23/2014 0505   K 4.0 09/25/2014 0554   K 4.1 09/24/2014 0529   K 3.9 09/23/2014 0505   CL 104 09/25/2014 0554   CL 107 09/24/2014 0529   CL 107 09/23/2014 0505   CO2 21 09/25/2014 0554   CO2 24 09/24/2014 0529   CO2 25 09/23/2014 0505   GLUCOSE 91 09/25/2014 0554   GLUCOSE 110* 09/24/2014 0529   GLUCOSE 117* 09/23/2014 0505   BUN 47* 09/25/2014 0554   BUN 47* 09/24/2014 0529   BUN 48* 09/23/2014 0505   CREATININE 1.50* 09/25/2014 0554   CREATININE 1.40* 09/24/2014 0529   CREATININE 1.52* 09/23/2014 0505   CALCIUM 8.7  09/25/2014 0554   CALCIUM 8.7 09/24/2014 0529   CALCIUM 8.6 09/23/2014 0505   GFRNONAA 36* 09/25/2014 0554   GFRNONAA 39* 09/24/2014 0529   GFRNONAA 36* 09/23/2014 0505   GFRAA 42* 09/25/2014 0554   GFRAA 46* 09/24/2014 0529   GFRAA 41* 09/23/2014 0505   CBC    Component Value Date/Time   WBC 4.2 09/22/2014 1202   RBC 5.45* 09/22/2014 1202   HGB 13.6 09/22/2014 1202   HCT 42.2 09/22/2014 1202   PLT 211 09/22/2014 1202   MCV 77.4* 09/22/2014 1202   MCH 25.0* 09/22/2014 1202   MCHC 32.2 09/22/2014 1202   RDW 20.0* 09/22/2014 1202   LYMPHSABS 1.1 09/22/2014 1202   MONOABS 0.3 09/22/2014 1202   EOSABS 0.1 09/22/2014 1202   BASOSABS 0.0 09/22/2014 1202   HEPATIC Function Panel  Recent Labs  06/25/14 0520 08/12/14 1403 09/22/14 1202  PROT 6.2 6.5 6.9   HEMOGLOBIN A1C No components found for: HGA1C,  MPG CARDIAC ENZYMES Lab Results  Component Value Date   CKTOTAL 59 09/07/2012   CKMB 3.2 09/07/2012   TROPONINI <0.03 09/22/2014   TROPONINI <0.03 09/22/2014   TROPONINI <0.03 09/22/2014   BNP  Recent Labs  04/02/14 0843 04/09/14 0451 06/19/14 1420  PROBNP 11219.0* 8206.0* 14027.0*   TSH  Recent Labs  02/03/14 1239  TSH 1.990  CHOLESTEROL No results for input(s): CHOL in the last 8760 hours.  Scheduled Meds: . allopurinol  100 mg Oral BID  . amLODipine  5 mg Oral BID  . aspirin EC  81 mg Oral Daily  . carvedilol  3.125 mg Oral BID WC  . digoxin  0.0625 mg Oral Daily  . furosemide  80 mg Intravenous Q12H  . heparin  5,000 Units Subcutaneous 3 times per day  . insulin aspart  0-15 Units Subcutaneous TID WC  . linagliptin  5 mg Oral Daily  . potassium chloride  10 mEq Oral Daily  . sodium chloride  3 mL Intravenous Q12H  . spironolactone  25 mg Oral QHS   Continuous Infusions:  PRN Meds:.sodium chloride, acetaminophen, cloNIDine, nitroGLYCERIN, ondansetron (ZOFRAN) IV, sodium chloride  Assessment/Plan: Acute on chronic biventricular systolic  failure Coronary artery disease Status post stent in LAD Type 2 diabetes mellitus Hypertension Anxiety  Continue diureses as tolerated. Increase activity.     LOS: 3 days    Tanya CobbAjay Kaylena Pacifico  MD  09/25/2014, 1:18 PM

## 2014-09-25 NOTE — Care Management Note (Unsigned)
    Page 1 of 1   09/25/2014     4:15:23 PM CARE MANAGEMENT NOTE 09/25/2014  Patient:  [name],Althia A   Account Number:  [id number]  Date Initiated:  09/25/2014  Documentation initiated by:  Ashwini Jago  Subjective/Objective Assessment:   Pt adm on 09/22/14 with CHF.  PTA, pt independent, lives with spouse.     Action/Plan:   Will follow for dc needs as pt progresses.   Anticipated DC Date:  09/28/2014   Anticipated DC Plan:  HOME W HOME HEALTH SERVICES      DC Planning Services  CM consult      Choice offered to / List presented to:             Status of service:  In process, will continue to follow Medicare Important Message given?   (If response is "NO", the following Medicare IM given date fields will be blank) Date Medicare IM given:   Medicare IM given by:   Date Additional Medicare IM given:   Additional Medicare IM given by:    Discharge Disposition:    Per UR Regulation:  Reviewed for med. necessity/level of care/duration of stay  If discussed at Long Length of Stay Meetings, dates discussed:    Comments:

## 2014-09-26 LAB — GLUCOSE, CAPILLARY
GLUCOSE-CAPILLARY: 169 mg/dL — AB (ref 70–99)
Glucose-Capillary: 117 mg/dL — ABNORMAL HIGH (ref 70–99)
Glucose-Capillary: 97 mg/dL (ref 70–99)
Glucose-Capillary: 123 mg/dL — ABNORMAL HIGH (ref 70–99)

## 2014-09-26 NOTE — Progress Notes (Signed)
Ref: [name], MD   Subjective:  Good diuresis. Discussed fluid intake and output, daily weight and diet.  Objective:  Vital Signs in the last 24 hours: Temp:  [97.4 F (36.3 C)-98.9 F (37.2 C)] 97.5 F (36.4 C) (03/18 0524) Pulse Rate:  [58-68] 68 (03/18 0957) Cardiac Rhythm:  [-] Normal sinus rhythm (03/18 0818) Resp:  [18] 18 (03/18 0524) BP: (126-137)/(65-68) 136/66 mmHg (03/18 0957) SpO2:  [95 %-98 %] 95 % (03/18 0524) Weight:  [72.757 kg (160 lb 6.4 oz)] 72.757 kg (160 lb 6.4 oz) (03/18 0524)  Physical Exam: BP Readings from Last 1 Encounters:  09/26/14 136/66    Wt Readings from Last 1 Encounters:  09/26/14 72.757 kg (160 lb 6.4 oz)    Weight change: -0.943 kg (-2 lb 1.3 oz)  HEENT: [place]/AT, Eyes-Brown, PERL, EOMI, Conjunctiva-Pink, Sclera-Non-icteric Neck: + JVD, No bruit, Trachea midline. Lungs:  Clear, Bilateral. Cardiac:  Regular rhythm, normal S1 and S2, no S3. III/VI systolic murmur. Abdomen:  Soft, non-tender. Extremities:  1-2 + edema present up to the thighs. No cyanosis. No clubbing. CNS: AxOx3, Cranial nerves grossly intact, moves all 4 extremities. Right handed. Skin: Warm and dry.   Intake/Output from previous day: 03/17 0701 - 03/18 0700 In: 883 [P.O.:880; I.V.:3] Out: 4700 [Urine:4700]    Lab Results: BMET    Component Value Date/Time   NA 137 09/25/2014 0554   NA 138 09/24/2014 0529   NA 140 09/23/2014 0505   K 4.0 09/25/2014 0554   K 4.1 09/24/2014 0529   K 3.9 09/23/2014 0505   CL 104 09/25/2014 0554   CL 107 09/24/2014 0529   CL 107 09/23/2014 0505   CO2 21 09/25/2014 0554   CO2 24 09/24/2014 0529   CO2 25 09/23/2014 0505   GLUCOSE 91 09/25/2014 0554   GLUCOSE 110* 09/24/2014 0529   GLUCOSE 117* 09/23/2014 0505   BUN 47* 09/25/2014 0554   BUN 47* 09/24/2014 0529   BUN 48* 09/23/2014 0505   CREATININE 1.50* 09/25/2014 0554   CREATININE 1.40* 09/24/2014 0529   CREATININE 1.52* 09/23/2014 0505   CALCIUM 8.7 09/25/2014 0554    CALCIUM 8.7 09/24/2014 0529   CALCIUM 8.6 09/23/2014 0505   GFRNONAA 36* 09/25/2014 0554   GFRNONAA 39* 09/24/2014 0529   GFRNONAA 36* 09/23/2014 0505   GFRAA 42* 09/25/2014 0554   GFRAA 46* 09/24/2014 0529   GFRAA 41* 09/23/2014 0505   CBC    Component Value Date/Time   WBC 4.2 09/22/2014 1202   RBC 5.45* 09/22/2014 1202   HGB 13.6 09/22/2014 1202   HCT 42.2 09/22/2014 1202   PLT 211 09/22/2014 1202   MCV 77.4* 09/22/2014 1202   MCH 25.0* 09/22/2014 1202   MCHC 32.2 09/22/2014 1202   RDW 20.0* 09/22/2014 1202   LYMPHSABS 1.1 09/22/2014 1202   MONOABS 0.3 09/22/2014 1202   EOSABS 0.1 09/22/2014 1202   BASOSABS 0.0 09/22/2014 1202   HEPATIC Function Panel  Recent Labs  06/25/14 0520 08/12/14 1403 09/22/14 1202  PROT 6.2 6.5 6.9   HEMOGLOBIN A1C No components found for: HGA1C,  MPG CARDIAC ENZYMES Lab Results  Component Value Date   CKTOTAL 59 09/07/2012   CKMB 3.2 09/07/2012   TROPONINI <0.03 09/22/2014   TROPONINI <0.03 09/22/2014   TROPONINI <0.03 09/22/2014   BNP  Recent Labs  04/02/14 0843 04/09/14 0451 06/19/14 1420  PROBNP 11219.0* 8206.0* 14027.0*   TSH  Recent Labs  02/03/14 1239  TSH 1.990   CHOLESTEROL No results  for input(s): CHOL in the last 8760 hours.  Scheduled Meds: . allopurinol  100 mg Oral BID  . amLODipine  5 mg Oral BID  . aspirin EC  81 mg Oral Daily  . carvedilol  3.125 mg Oral BID WC  . digoxin  0.0625 mg Oral Daily  . furosemide  80 mg Intravenous Q12H  . heparin  5,000 Units Subcutaneous 3 times per day  . insulin aspart  0-15 Units Subcutaneous TID WC  . linagliptin  5 mg Oral Daily  . potassium chloride  10 mEq Oral Daily  . sodium chloride  3 mL Intravenous Q12H  . 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 Check BMET in AM. Dr. Sharyn LullHarwani covering for weekend.     LOS: 4 days    Orpah CobbAjay Quinisha Mould  MD  09/26/2014, 11:32 AM

## 2014-09-26 NOTE — Plan of Care (Signed)
Problem: Phase I Progression Outcomes Goal: EF % per last Echo/documented,Core Reminder form on chart Outcome: Completed/Met Date Met:  09/26/14 EF performed on 09/23/2014 EF% result is 30-305%

## 2014-09-27 LAB — BASIC METABOLIC PANEL
Anion gap: 9 (ref 5–15)
BUN: 45 mg/dL — ABNORMAL HIGH (ref 6–23)
CALCIUM: 8.8 mg/dL (ref 8.4–10.5)
CO2: 27 mmol/L (ref 19–32)
Chloride: 100 mmol/L (ref 96–112)
Creatinine, Ser: 1.49 mg/dL — ABNORMAL HIGH (ref 0.50–1.10)
GFR, EST AFRICAN AMERICAN: 42 mL/min — AB (ref >90)
GFR, EST NON AFRICAN AMERICAN: 36 mL/min — AB (ref >90)
GLUCOSE: 78 mg/dL (ref 70–99)
Potassium: 4.1 mmol/L (ref 3.5–5.1)
Sodium: 136 mmol/L (ref 135–145)

## 2014-09-27 LAB — GLUCOSE, CAPILLARY
GLUCOSE-CAPILLARY: 80 mg/dL (ref 70–99)
Glucose-Capillary: 96 mg/dL (ref 70–99)
Glucose-Capillary: 202 mg/dL — ABNORMAL HIGH (ref 70–99)
Glucose-Capillary: 136 mg/dL — ABNORMAL HIGH (ref 70–99)

## 2014-09-27 NOTE — Progress Notes (Signed)
Subjective:  Patient denies any chest pain states breathing has markedly improved. Leg swelling also gradually improving with IV Lasix  Objective:  Vital Signs in the last 24 hours: Temp:  [97.7 F (36.5 C)-98.3 F (36.8 C)] 98.2 F (36.8 C) (03/19 0449) Pulse Rate:  [60-69] 63 (03/19 0449) Resp:  [18] 18 (03/19 0449) BP: (126-146)/(59-72) 146/72 mmHg (03/19 0449) SpO2:  [100 %] 100 % (03/19 0449) Weight:  [69.491 kg (153 lb 3.2 oz)] 69.491 kg (153 lb 3.2 oz) (03/19 0449)  Intake/Output from previous day: 03/18 0701 - 03/19 0700 In: 1160 [P.O.:1160] Out: 5200 [Urine:5200] Intake/Output from this shift:    Physical Exam: Neck: JVD - 8 cm above sternal notch, no adenopathy, no carotid bruit and supple, symmetrical, trachea midline Lungs: Decreased breath sound at bases with faint rales Heart: regular rate and rhythm, S1, S2 normal and Soft systolic murmur and S3 gallop noted Abdomen: soft, non-tender; bowel sounds normal; no masses,  no organomegaly Extremities: No clubbing cyanosis 2+ edema noted  Lab Results: No results for input(s): WBC, HGB, PLT in the last 72 hours.  Recent Labs  09/25/14 0554 09/27/14 0317  NA 137 136  K 4.0 4.1  CL 104 100  CO2 21 27  GLUCOSE 91 78  BUN 47* 45*  CREATININE 1.50* 1.49*   No results for input(s): TROPONINI in the last 72 hours.  Invalid input(s): CK, MB Hepatic Function Panel No results for input(s): PROT, ALBUMIN, AST, ALT, ALKPHOS, BILITOT, BILIDIR, IBILI in the last 72 hours. No results for input(s): CHOL in the last 72 hours. No results for input(s): PROTIME in the last 72 hours.  Imaging: Imaging results have been reviewed and No results found.  Cardiac Studies:  Assessment/Plan:  Resolving decompensated systolic heart failure Coronary artery disease status post PCI to LAD in the past Hypertension Type 2 diabetes mellitus Anxiety disorder Chronic kidney disease stage II Plan Continue present management Check  labs in a.m.  LOS: 5 days    Kamaryn Grimley N 09/27/2014, 10:51 AM

## 2014-09-28 LAB — GLUCOSE, CAPILLARY
Glucose-Capillary: 115 mg/dL — ABNORMAL HIGH (ref 70–99)
Glucose-Capillary: 119 mg/dL — ABNORMAL HIGH (ref 70–99)
Glucose-Capillary: 143 mg/dL — ABNORMAL HIGH (ref 70–99)
Glucose-Capillary: 106 mg/dL — ABNORMAL HIGH (ref 70–99)

## 2014-09-28 LAB — BASIC METABOLIC PANEL
ANION GAP: 9 (ref 5–15)
BUN: 47 mg/dL — AB (ref 6–23)
CALCIUM: 8.8 mg/dL (ref 8.4–10.5)
CO2: 29 mmol/L (ref 19–32)
CREATININE: 1.53 mg/dL — AB (ref 0.50–1.10)
Chloride: 99 mmol/L (ref 96–112)
GFR calc Af Amer: 41 mL/min — ABNORMAL LOW (ref >90)
GFR calc non Af Amer: 35 mL/min — ABNORMAL LOW (ref >90)
Glucose, Bld: 96 mg/dL (ref 70–99)
POTASSIUM: 4.6 mmol/L (ref 3.5–5.1)
Sodium: 137 mmol/L (ref 135–145)

## 2014-09-28 LAB — CBC
HEMATOCRIT: 42.4 % (ref 36.0–46.0)
Hemoglobin: 13.8 g/dL (ref 12.0–15.0)
MCH: 25.1 pg — ABNORMAL LOW (ref 26.0–34.0)
MCHC: 32.5 g/dL (ref 30.0–36.0)
MCV: 77.1 fL — ABNORMAL LOW (ref 78.0–100.0)
PLATELETS: 241 10*3/uL (ref 150–400)
RBC: 5.5 MIL/uL — ABNORMAL HIGH (ref 3.87–5.11)
RDW: 19.9 % — AB (ref 11.5–15.5)
WBC: 4.4 10*3/uL (ref 4.0–10.5)

## 2014-09-28 LAB — BRAIN NATRIURETIC PEPTIDE: B Natriuretic Peptide: 902.2 pg/mL — ABNORMAL HIGH (ref 0.0–100.0)

## 2014-09-28 NOTE — Progress Notes (Signed)
Subjective:  Doing well denies any chest pain states breathing has improved diuresing well. Lost approximately 20 pounds since admission.  Objective:  Vital Signs in the last 24 hours: Temp:  [97.8 F (36.6 C)-98.6 F (37 C)] 98.3 F (36.8 C) (03/20 0435) Pulse Rate:  [50-80] 64 (03/20 0930) Resp:  [18-20] 18 (03/20 0435) BP: (132-148)/(56-75) 140/59 mmHg (03/20 0930) SpO2:  [96 %-100 %] 100 % (03/20 0435) Weight:  [68.13 kg (150 lb 3.2 oz)] 68.13 kg (150 lb 3.2 oz) (03/20 0435)  Intake/Output from previous day: 03/19 0701 - 03/20 0700 In: 890 [P.O.:890] Out: 4600 [Urine:4600] Intake/Output from this shift: Total I/O In: 120 [P.O.:120] Out: -   Physical Exam: Neck: JVD - 8 cm above sternal notch, no adenopathy, no carotid bruit and supple, symmetrical, trachea midline Lungs: Decreased breath sound at bases with faint rales Heart: regular rate and rhythm, S1, S2 normal and Soft systolic murmur and S3 gallop noted Abdomen: soft, non-tender; bowel sounds normal; no masses,  no organomegaly Extremities: No clubbing cyanosis 2+ edema noted  Lab Results:  Recent Labs  09/28/14 0703  WBC 4.4  HGB 13.8  PLT 241    Recent Labs  09/27/14 0317 09/28/14 0703  NA 136 137  K 4.1 4.6  CL 100 99  CO2 27 29  GLUCOSE 78 96  BUN 45* 47*  CREATININE 1.49* 1.53*   No results for input(s): TROPONINI in the last 72 hours.  Invalid input(s): CK, MB Hepatic Function Panel No results for input(s): PROT, ALBUMIN, AST, ALT, ALKPHOS, BILITOT, BILIDIR, IBILI in the last 72 hours. No results for input(s): CHOL in the last 72 hours. No results for input(s): PROTIME in the last 72 hours.  Imaging: Imaging results have been reviewed and No results found.  Cardiac Studies:  Assessment/Plan:  Resolving decompensated systolic heart failure Coronary artery disease status post PCI to LAD in the past Hypertension Type 2 diabetes mellitus Anxiety disorder Chronic kidney disease stage  II Plan Continue present management Restrict fluid to 1 L per 24 hours Daily weights  LOS: 6 days    [name] N 09/28/2014, 11:22 AM

## 2014-09-29 LAB — GLUCOSE, CAPILLARY
GLUCOSE-CAPILLARY: 89 mg/dL (ref 70–99)
Glucose-Capillary: 147 mg/dL — ABNORMAL HIGH (ref 70–99)

## 2014-09-29 MED ORDER — RAMIPRIL 2.5 MG PO CAPS: 2.5000 mg | ORAL_CAPSULE | Freq: Every day | ORAL | Status: DC

## 2014-09-29 MED ORDER — RAMIPRIL 2.5 MG PO CAPS
2.5000 mg | ORAL_CAPSULE | Freq: Every day | ORAL | Status: DC
  Administered 2014-09-29: 2.5 mg via ORAL
  Filled 2014-09-29: qty 1

## 2014-09-29 MED ORDER — LINAGLIPTIN 5 MG PO TABS: 5.0000 mg | ORAL_TABLET | Freq: Every day | ORAL | Status: DC

## 2014-09-29 NOTE — Progress Notes (Signed)
Pharmacist Heart Failure Core Measure Documentation  Assessment: [name] has an EF documented as 30-35% on 09/23/14 by ECHO.  Rationale: Heart failure patients with left ventricular systolic dysfunction (LVSD) and an EF < 40% should be prescribed an angiotensin converting enzyme inhibitor (ACEI) or angiotensin receptor blocker (ARB) at discharge unless a contraindication is documented in the medical record.  This patient is not currently on an ACEI or ARB for HF.  This note is being placed in the record in order to provide documentation that a contraindication to the use of these agents is present for this encounter.  ACE Inhibitor or Angiotensin Receptor Blocker is contraindicated (specify all that apply)  []   ACEI allergy AND ARB allergy []   Angioedema []   Moderate or severe aortic stenosis []   Hyperkalemia []   Hypotension []   Renal artery stenosis [x]   Worsening renal function, preexisting renal disease or dysfunction --Patient has CKD stage II. Appears to be at baseline.   **MD - Please consider need for ACEi or ARB in setting of stable renal insufficiency.   Fayne NorrieMillen, Keyonta Barradas Brown 09/29/2014 8:17 AM

## 2014-09-29 NOTE — Progress Notes (Signed)
DC IV, DC Tele, DC Home. Discharge instructions and home medications discussed with patient. Patient denied any questions or concerns at this time. Patient leaving unit via wheelchair and appears in no acute distress.  

## 2014-09-29 NOTE — Discharge Summary (Signed)
Physician Discharge Summary  Patient ID: Tanya SpikeMargaret A Topping MRN: [id number] DOB/AGE: 12/03/1950 64 y.o.  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 Coronary artery disease Status post stent in LAD Type 2 diabetes mellitus Hypertension Anxiety  Discharge Diagnoses:  Principal Problem:   Acute on chronic biventricular heart failure, NYHA class 2 Active Problems:   Coronary artery disease   S/P Stent in LAD   Dilated cardiomyopathy   Diabetes mellitus, II   Essential hypertension   Pulmonary hypertension   Anxiety   CKD, II  Discharged Condition: fair  Hospital Course: 64 year old female with leg edema and progressive worsening of shortness of breath and 10 pound weight gain x 1-2 weeks has past medical history of acute biventricular systolic heart failure, CAD, status post stent in LAD, diabetes mellitus type 2, hypertension and anxiety. She had slow and steady diuresis with lasix and lost about 20 pounds of fluids in 1 week without worsening of BUN/CR. A small dose of Ramipril was added and Metformin was changed to Tradjenta with good sugar control. Her BNP improved by 40 %  And leg edema nearly resolved. She was advised to follow heart failure booklet instructions, decrease fluids intake by 50 %, daily walking and notify me if weight goes up more than 2 pounds in 1 day or 5 pounds in 1 week. She will be followed by me in 1 week.   Consults: cardiology  Significant Diagnostic Studies: labs: BNP-1536.8 on admission and 902.2. CBC remained near normal with low MCV. Electrolytes remained normal and creatinine stayed around 1.5  Chest X-ray: 1. Decreased left pleural fluid and adjacent atelectasis. 2. Stable cardiomegaly.  Limited/F/U Echo: - Left ventricle: The cavity size was mildly dilated. There was mild concentric hypertrophy. Systolic function was moderately to severely reduced. The estimated ejection  fraction was in the range of 30% to 35%. Hypokinesis of the anterior myocardium. Hypokinesis of the inferior myocardium. Hypokinesis of the lateral myocardium. Doppler parameters are consistent with abnormal left ventricular relaxation (grade 1 diastolic dysfunction). - Aortic valve: There was mild regurgitation. - Mitral valve: There was severe regurgitation. - Left atrium: The atrium was moderately to severely dilated. - Right ventricle: The cavity size was mildly dilated. Wall thickness was normal. Systolic function was moderately reduced. - Right atrium: The atrium was severely dilated. - Tricuspid valve: There was moderate-severe regurgitation. - Pulmonary arteries: Systolic pressure was moderately increased. PA peak pressure: 59 mm Hg (S). - Pericardium, extracardiac: There was a left pleural effusion.  EKG-SR, old ASWMI.  Treatments: cardiac meds: ramipril (Altace), carvedilol, clonidine, potassium, NTG SL, spironolactone, amlodipine, digoxin and furosemide  Discharge Exam: Blood pressure 150/65, pulse 71, temperature 98.2 F (36.8 C), temperature source Oral, resp. rate 16, height 5\' 5"  (1.651 m), weight 67.882 kg (149 lb 10.4 oz), SpO2 98 %. HEENT: [place]/AT, Eyes-Brown, PERL, EOMI, Conjunctiva-Pink, Sclera-Non-icteric Neck: No JVD, No bruit, Trachea midline. Lungs: Clear, Bilateral. Cardiac: Regular rhythm, normal S1 and S2, no S3. III/VI systolic murmur. Abdomen: Soft, non-tender. Extremities: Trace + edema present up to the thighs. No cyanosis. No clubbing. CNS: AxOx3, Cranial nerves grossly intact, moves all 4 extremities. Right handed. Skin: Warm and dry.  Disposition: 01-Home or Self Care     Medication List    STOP taking these medications        metFORMIN 500 MG tablet  Commonly known as:  GLUCOPHAGE      TAKE these medications  acetaminophen 500 MG tablet  Commonly known as:  TYLENOL  Take 1,000 mg by mouth every 6 (six) hours as needed  (pain).     allopurinol 100 MG tablet  Commonly known as:  ZYLOPRIM  Take 100 mg by mouth 2 (two) times daily.     amLODipine 5 MG tablet  Commonly known as:  NORVASC  Take 5 mg by mouth 2 (two) times daily.     aspirin 81 MG EC tablet  Take 1 tablet (81 mg total) by mouth daily.     carvedilol 3.125 MG tablet  Commonly known as:  COREG  Take 1 tablet (3.125 mg total) by mouth 2 (two) times daily with a meal.     cloNIDine 0.2 MG tablet  Commonly known as:  CATAPRES  Take 0.1 mg by mouth daily as needed (for high blood pressure).     digoxin 0.125 MG tablet  Commonly known as:  LANOXIN  Take 0.5 tablets (0.0625 mg total) by mouth daily.     furosemide 80 MG tablet  Commonly known as:  LASIX  Take 80 mg by mouth 2 (two) times daily.     linagliptin 5 MG Tabs tablet  Commonly known as:  TRADJENTA  Take 1 tablet (5 mg total) by mouth daily.     nitroGLYCERIN 0.4 MG SL tablet  Commonly known as:  NITROSTAT  Place 0.4 mg under the tongue every 5 (five) minutes as needed for chest pain.     potassium chloride 10 MEQ tablet  Commonly known as:  K-DUR  Take 10 mEq by mouth daily.     ramipril 2.5 MG capsule  Commonly known as:  ALTACE  Take 1 capsule (2.5 mg total) by mouth daily.     spironolactone 25 MG tablet  Commonly known as:  ALDACTONE  Take 25 mg by mouth at bedtime.           Follow-up Information    Follow up with [facility] S, MD. Schedule an appointment as soon as possible for a visit in 1 week.   Specialty:  Cardiology   Contact information:   [address] [place] [address] [phone]       Signed: [name] 09/29/2014, 9:59 AM

## 2014-10-10 ENCOUNTER — Other Ambulatory Visit: Payer: Self-pay | Admitting: Physician Assistant

## 2014-11-28 ENCOUNTER — Encounter (HOSPITAL_COMMUNITY): Payer: Self-pay | Admitting: General Practice

## 2014-11-28 ENCOUNTER — Inpatient Hospital Stay (HOSPITAL_COMMUNITY)
Admission: AD | Admit: 2014-11-28 | Discharge: 2014-12-08 | DRG: 292 | Disposition: A | Discharge status: Home / Self Care | Payer: 59 | Source: Ambulatory Visit | Attending: Cardiovascular Disease | Admitting: Cardiovascular Disease

## 2014-11-28 DIAGNOSIS — I5082 Biventricular heart failure: Secondary | ICD-10-CM

## 2014-11-28 DIAGNOSIS — E875 Hyperkalemia: Secondary | ICD-10-CM | POA: Diagnosis present

## 2014-11-28 DIAGNOSIS — Z87891 Personal history of nicotine dependence: Secondary | ICD-10-CM | POA: Diagnosis not present

## 2014-11-28 DIAGNOSIS — Z7982 Long term (current) use of aspirin: Secondary | ICD-10-CM | POA: Diagnosis not present

## 2014-11-28 DIAGNOSIS — Z79899 Other long term (current) drug therapy: Secondary | ICD-10-CM | POA: Diagnosis not present

## 2014-11-28 DIAGNOSIS — I252 Old myocardial infarction: Secondary | ICD-10-CM

## 2014-11-28 DIAGNOSIS — E8809 Other disorders of plasma-protein metabolism, not elsewhere classified: Secondary | ICD-10-CM | POA: Diagnosis present

## 2014-11-28 DIAGNOSIS — K219 Gastro-esophageal reflux disease without esophagitis: Secondary | ICD-10-CM | POA: Diagnosis present

## 2014-11-28 DIAGNOSIS — N183 Chronic kidney disease, stage 3 (moderate): Secondary | ICD-10-CM | POA: Diagnosis present

## 2014-11-28 DIAGNOSIS — I5023 Acute on chronic systolic (congestive) heart failure: Principal | ICD-10-CM | POA: Diagnosis present

## 2014-11-28 DIAGNOSIS — F419 Anxiety disorder, unspecified: Secondary | ICD-10-CM | POA: Diagnosis present

## 2014-11-28 DIAGNOSIS — Z955 Presence of coronary angioplasty implant and graft: Secondary | ICD-10-CM | POA: Diagnosis not present

## 2014-11-28 DIAGNOSIS — I129 Hypertensive chronic kidney disease with stage 1 through stage 4 chronic kidney disease, or unspecified chronic kidney disease: Secondary | ICD-10-CM | POA: Diagnosis present

## 2014-11-28 DIAGNOSIS — E119 Type 2 diabetes mellitus without complications: Secondary | ICD-10-CM | POA: Diagnosis present

## 2014-11-28 DIAGNOSIS — I10 Essential (primary) hypertension: Secondary | ICD-10-CM | POA: Diagnosis present

## 2014-11-28 DIAGNOSIS — I509 Heart failure, unspecified: Secondary | ICD-10-CM

## 2014-11-28 DIAGNOSIS — Z9111 Patient's noncompliance with dietary regimen: Secondary | ICD-10-CM | POA: Diagnosis present

## 2014-11-28 DIAGNOSIS — N179 Acute kidney failure, unspecified: Secondary | ICD-10-CM | POA: Diagnosis present

## 2014-11-28 DIAGNOSIS — I2510 Atherosclerotic heart disease of native coronary artery without angina pectoris: Secondary | ICD-10-CM | POA: Diagnosis present

## 2014-11-28 DIAGNOSIS — E1129 Type 2 diabetes mellitus with other diabetic kidney complication: Secondary | ICD-10-CM

## 2014-11-28 LAB — CBC WITH DIFFERENTIAL/PLATELET
BASOS ABS: 0 10*3/uL (ref 0.0–0.1)
BASOS PCT: 0 % (ref 0–1)
EOS PCT: 0 % (ref 0–5)
Eosinophils Absolute: 0 10*3/uL (ref 0.0–0.7)
HEMATOCRIT: 45.2 % (ref 36.0–46.0)
Hemoglobin: 14.6 g/dL (ref 12.0–15.0)
LYMPHS PCT: 30 % (ref 12–46)
Lymphs Abs: 1.6 10*3/uL (ref 0.7–4.0)
MCH: 25.7 pg — ABNORMAL LOW (ref 26.0–34.0)
MCHC: 32.3 g/dL (ref 30.0–36.0)
MCV: 79.4 fL (ref 78.0–100.0)
MONO ABS: 0.5 10*3/uL (ref 0.1–1.0)
Monocytes Relative: 10 % (ref 3–12)
NEUTROS PCT: 60 % (ref 43–77)
Neutro Abs: 3.1 10*3/uL (ref 1.7–7.7)
PLATELETS: 223 10*3/uL (ref 150–400)
RBC: 5.69 MIL/uL — ABNORMAL HIGH (ref 3.87–5.11)
RDW: 18.9 % — AB (ref 11.5–15.5)
WBC: 5.2 10*3/uL (ref 4.0–10.5)

## 2014-11-28 LAB — COMPREHENSIVE METABOLIC PANEL
ALT: 16 U/L (ref 14–54)
AST: 24 U/L (ref 15–41)
Albumin: 3.2 g/dL — ABNORMAL LOW (ref 3.5–5.0)
Alkaline Phosphatase: 82 U/L (ref 38–126)
Anion gap: 13 (ref 5–15)
BILIRUBIN TOTAL: 3.3 mg/dL — AB (ref 0.3–1.2)
BUN: 52 mg/dL — ABNORMAL HIGH (ref 6–20)
CALCIUM: 9.1 mg/dL (ref 8.9–10.3)
CHLORIDE: 104 mmol/L (ref 101–111)
CO2: 22 mmol/L (ref 22–32)
Creatinine, Ser: 1.69 mg/dL — ABNORMAL HIGH (ref 0.44–1.00)
GFR calc Af Amer: 36 mL/min — ABNORMAL LOW (ref >60)
GFR, EST NON AFRICAN AMERICAN: 31 mL/min — AB (ref >60)
GLUCOSE: 107 mg/dL — AB (ref 65–99)
Potassium: 4 mmol/L (ref 3.5–5.1)
SODIUM: 139 mmol/L (ref 135–145)
Total Protein: 7.2 g/dL (ref 6.5–8.1)

## 2014-11-28 LAB — GLUCOSE, CAPILLARY
GLUCOSE-CAPILLARY: 197 mg/dL — AB (ref 65–99)
Glucose-Capillary: 138 mg/dL — ABNORMAL HIGH (ref 65–99)

## 2014-11-28 LAB — TROPONIN I: Troponin I: <0.03 ng/mL (ref <0.031)

## 2014-11-28 LAB — BRAIN NATRIURETIC PEPTIDE: B Natriuretic Peptide: 3161.5 pg/mL — ABNORMAL HIGH (ref 0.0–100.0)

## 2014-11-28 MED ORDER — AMLODIPINE BESYLATE 5 MG PO TABS
5.0000 mg | ORAL_TABLET | Freq: Two times a day (BID) | ORAL | Status: DC
  Administered 2014-11-28 – 2014-11-30 (×4): 5 mg via ORAL
  Filled 2014-11-28 (×5): qty 1

## 2014-11-28 MED ORDER — LINAGLIPTIN 5 MG PO TABS
5.0000 mg | ORAL_TABLET | Freq: Every day | ORAL | Status: DC
  Administered 2014-11-29 – 2014-12-08 (×10): 5 mg via ORAL
  Filled 2014-11-28 (×10): qty 1

## 2014-11-28 MED ORDER — SODIUM CHLORIDE 0.9 % IJ SOLN: 3.0000 mL | INTRAMUSCULAR | Status: DC | PRN

## 2014-11-28 MED ORDER — NITROGLYCERIN 0.4 MG SL SUBL: 0.4000 mg | SUBLINGUAL_TABLET | SUBLINGUAL | Status: DC | PRN

## 2014-11-28 MED ORDER — DIGOXIN 0.0625 MG HALF TABLET
0.0625 mg | ORAL_TABLET | Freq: Every day | ORAL | Status: DC
  Administered 2014-11-28 – 2014-12-08 (×10): 0.0625 mg via ORAL
  Filled 2014-11-28 (×11): qty 1

## 2014-11-28 MED ORDER — FUROSEMIDE 10 MG/ML IJ SOLN
40.0000 mg | Freq: Two times a day (BID) | INTRAMUSCULAR | Status: DC
  Administered 2014-11-28 – 2014-12-01 (×6): 40 mg via INTRAVENOUS
  Filled 2014-11-28 (×8): qty 4

## 2014-11-28 MED ORDER — SODIUM CHLORIDE 0.9 % IJ SOLN
3.0000 mL | Freq: Two times a day (BID) | INTRAMUSCULAR | Status: DC
  Administered 2014-11-28 – 2014-12-08 (×21): 3 mL via INTRAVENOUS

## 2014-11-28 MED ORDER — CARVEDILOL 3.125 MG PO TABS
3.1250 mg | ORAL_TABLET | Freq: Two times a day (BID) | ORAL | Status: DC
  Administered 2014-11-28 – 2014-12-04 (×13): 3.125 mg via ORAL
  Filled 2014-11-28 (×16): qty 1

## 2014-11-28 MED ORDER — INSULIN ASPART 100 UNIT/ML ~~LOC~~ SOLN
0.0000 [IU] | Freq: Three times a day (TID) | SUBCUTANEOUS | Status: DC
Start: 2014-11-28 — End: 2014-12-08
  Administered 2014-11-28: 3 [IU] via SUBCUTANEOUS
  Administered 2014-11-29: 2 [IU] via SUBCUTANEOUS
  Administered 2014-11-30: 3 [IU] via SUBCUTANEOUS
  Administered 2014-12-01: 2 [IU] via SUBCUTANEOUS
  Administered 2014-12-01 – 2014-12-02 (×2): 3 [IU] via SUBCUTANEOUS
  Administered 2014-12-03 – 2014-12-08 (×4): 2 [IU] via SUBCUTANEOUS

## 2014-11-28 MED ORDER — ONDANSETRON HCL 4 MG/2ML IJ SOLN
4.0000 mg | Freq: Four times a day (QID) | INTRAMUSCULAR | Status: DC | PRN
  Filled 2014-11-28: qty 2

## 2014-11-28 MED ORDER — ALLOPURINOL 100 MG PO TABS
100.0000 mg | ORAL_TABLET | Freq: Two times a day (BID) | ORAL | Status: DC
  Administered 2014-11-28 – 2014-12-08 (×20): 100 mg via ORAL
  Filled 2014-11-28 (×22): qty 1

## 2014-11-28 MED ORDER — HEPARIN SODIUM (PORCINE) 5000 UNIT/ML IJ SOLN
5000.0000 [IU] | Freq: Three times a day (TID) | INTRAMUSCULAR | Status: DC
  Administered 2014-11-30 – 2014-12-07 (×13): 5000 [IU] via SUBCUTANEOUS
  Filled 2014-11-28 (×32): qty 1

## 2014-11-28 MED ORDER — POTASSIUM CHLORIDE ER 10 MEQ PO TBCR
10.0000 meq | EXTENDED_RELEASE_TABLET | Freq: Every day | ORAL | Status: DC
  Administered 2014-11-28 – 2014-12-01 (×4): 10 meq via ORAL
  Filled 2014-11-28 (×4): qty 1

## 2014-11-28 MED ORDER — RAMIPRIL 2.5 MG PO CAPS
2.5000 mg | ORAL_CAPSULE | Freq: Every day | ORAL | Status: DC
  Administered 2014-11-28 – 2014-11-29 (×2): 2.5 mg via ORAL
  Filled 2014-11-28 (×2): qty 1

## 2014-11-28 MED ORDER — CLONIDINE HCL 0.1 MG PO TABS: 0.1000 mg | ORAL_TABLET | Freq: Every day | ORAL | Status: DC | PRN

## 2014-11-28 MED ORDER — ASPIRIN EC 81 MG PO TBEC
81.0000 mg | DELAYED_RELEASE_TABLET | Freq: Every day | ORAL | Status: DC
  Administered 2014-11-28 – 2014-12-08 (×11): 81 mg via ORAL
  Filled 2014-11-28 (×11): qty 1

## 2014-11-28 MED ORDER — ACETAMINOPHEN 325 MG PO TABS
650.0000 mg | ORAL_TABLET | ORAL | Status: DC | PRN
  Administered 2014-12-06: 650 mg via ORAL
  Filled 2014-11-28: qty 2

## 2014-11-28 MED ORDER — SODIUM CHLORIDE 0.9 % IV SOLN: 250.0000 mL | INTRAVENOUS | Status: DC | PRN

## 2014-11-28 MED ORDER — SPIRONOLACTONE 25 MG PO TABS
25.0000 mg | ORAL_TABLET | Freq: Every day | ORAL | Status: DC
  Administered 2014-11-28 – 2014-12-03 (×6): 25 mg via ORAL
  Filled 2014-11-28 (×7): qty 1

## 2014-11-28 NOTE — H&P (Signed)
Referring Physician:  Teddy SpikeMargaret A Licht is an 64 y.o. female.                       Chief Complaint: Leg edema and exertional shortness of breath  HPI: 64 year old female presents with leg edema and progressive worsening of shortness of breath and 10-15 pound weight gain x 2-3 weeks has past medical history of acute biventricular systolic heart failure, CAD, status post stent in LAD, diabetes mellitus type 2, hypertension and anxiety. No fever or chest pain. Has some medications and dietary non-compliance off and on.  Past Medical History  Diagnosis Date  . Hypertension   . Coronary artery disease   . CHF (congestive heart failure)   . Shortness of breath dyspnea   . High cholesterol   . Type II diabetes mellitus   . GERD (gastroesophageal reflux disease)   . Gout       Past Surgical History  Procedure Laterality Date  . Left and right heart catheterization with coronary angiogram N/A 11/25/2011    Procedure: LEFT AND RIGHT HEART CATHETERIZATION WITH CORONARY ANGIOGRAM;  Surgeon: Robynn PaneMohan N Harwani, MD;  Location: [facility] CATH LAB;  Service: Cardiovascular;  Laterality: N/A;  . Percutaneous coronary stent intervention (pci-s) Right 11/25/2011    Procedure: PERCUTANEOUS CORONARY STENT INTERVENTION (PCI-S);  Surgeon: Robynn PaneMohan N Harwani, MD;  [facility];  Service: Cardiovascular;  Laterality: Right;  . Left and right heart catheterization with coronary angiogram N/A 09/07/2012    Procedure: LEFT AND RIGHT HEART CATHETERIZATION WITH CORONARY ANGIOGRAM;  Surgeon: Ricki RodriguezAjay S Amire Gossen, MD;  Location: [facility] CATH LAB;  Service: Cardiovascular;  Laterality: N/A;  . Tonsillectomy  1950's  . Coronary angioplasty with stent placement  2013  . Cardiac catheterization  2014    No family history on file. Social History:  reports that she has quit smoking. Her smoking use included Cigarettes. She has a 10 pack-year smoking history. She has never used smokeless tobacco. She reports that she does not drink  alcohol or use illicit drugs.  Allergies: No Known Allergies  Medications Prior to Admission  Medication Sig Dispense Refill  . acetaminophen (TYLENOL) 500 MG tablet Take 1,000 mg by mouth every 6 (six) hours as needed (pain).    Marland Kitchen. allopurinol (ZYLOPRIM) 100 MG tablet Take 100 mg by mouth 2 (two) times daily.    Marland Kitchen. amLODipine (NORVASC) 5 MG tablet Take 5 mg by mouth 2 (two) times daily.    Marland Kitchen. aspirin EC 81 MG EC tablet Take 1 tablet (81 mg total) by mouth daily.    . carvedilol (COREG) 3.125 MG tablet Take 1 tablet (3.125 mg total) by mouth 2 (two) times daily with a meal. 60 tablet 6  . cloNIDine (CATAPRES) 0.2 MG tablet Take 0.1 mg by mouth daily as needed (for high blood pressure).     Marland Kitchen. digoxin (LANOXIN) 0.125 MG tablet Take 0.5 tablets (0.0625 mg total) by mouth daily. (Patient not taking: Reported on 09/22/2014) 15 tablet 1  . furosemide (LASIX) 80 MG tablet Take 80 mg by mouth 2 (two) times daily.    Marland Kitchen. linagliptin (TRADJENTA) 5 MG TABS tablet Take 1 tablet (5 mg total) by mouth daily. 30 tablet 1  . nitroGLYCERIN (NITROSTAT) 0.4 MG SL tablet Place 0.4 mg under the tongue every 5 (five) minutes as needed for chest pain.    . potassium chloride (K-DUR) 10 MEQ tablet Take 10 mEq by mouth daily.     .Marland Kitchen  ramipril (ALTACE) 2.5 MG capsule Take 1 capsule (2.5 mg total) by mouth daily. 30 capsule 1  . spironolactone (ALDACTONE) 25 MG tablet Take 25 mg by mouth at bedtime.      No results found for this or any previous visit (from the past 48 hour(s)). No results found.  Review Of Systems + wears glasses, + weight gain + partial dentures, + COPD, + angina, + leg edema, + hypertension, + DM, II, + CHF, No CVA, No seizures, No kidney stone, + arthritis.   Blood pressure 178/99, pulse 94, temperature 97.3 F (36.3 C), temperature source Other (Comment), resp. rate 18, height 5\' 4"  (1.626 m), weight 75.342 kg (166 lb 1.6 oz), SpO2 94 %. General: Averagely built and nourished in mild respiratory  distress. HEENT: [place]/AT, brown eyes, PERL, EOMI. conjuntiva-pink, sclera-white. Neck: + JVD, no bruit, no thyromegaly.  Lungs: Bilateral basal crackles.  Heart: Normal S1 and S2. Grade III/VI systolic murmur LSB  Abdomen: Mild swelling, non-tender.  Ext: 2 + edema up to thighs (and pre-sacral), bilaterally.  CNS: Cranial nerves grossly intact. Moves all 4 extremities. Skin: Warm and dry.  Assessment/Plan Acute Biventricular systolic heart failure  CAD  S/P Stent in LAD  DM, II  Hypertension  Anxiety  Admit/IV lasix and home medications.  [name],Jaqlyn Gruenhagen S, MD  11/28/2014, 2:49 PM

## 2014-11-29 ENCOUNTER — Inpatient Hospital Stay (HOSPITAL_COMMUNITY): Payer: 59

## 2014-11-29 LAB — BASIC METABOLIC PANEL
Anion gap: 12 (ref 5–15)
BUN: 55 mg/dL — ABNORMAL HIGH (ref 6–20)
CALCIUM: 8.7 mg/dL — AB (ref 8.9–10.3)
CO2: 21 mmol/L — AB (ref 22–32)
Chloride: 106 mmol/L (ref 101–111)
Creatinine, Ser: 1.96 mg/dL — ABNORMAL HIGH (ref 0.44–1.00)
GFR calc Af Amer: 30 mL/min — ABNORMAL LOW (ref >60)
GFR calc non Af Amer: 26 mL/min — ABNORMAL LOW (ref >60)
Glucose, Bld: 117 mg/dL — ABNORMAL HIGH (ref 65–99)
Potassium: 4.2 mmol/L (ref 3.5–5.1)
Sodium: 139 mmol/L (ref 135–145)

## 2014-11-29 LAB — HEMOGLOBIN A1C
Hgb A1c MFr Bld: 7.2 % — ABNORMAL HIGH (ref 4.8–5.6)
MEAN PLASMA GLUCOSE: 160 mg/dL

## 2014-11-29 LAB — GLUCOSE, CAPILLARY
GLUCOSE-CAPILLARY: 115 mg/dL — AB (ref 65–99)
Glucose-Capillary: 139 mg/dL — ABNORMAL HIGH (ref 65–99)
Glucose-Capillary: 108 mg/dL — ABNORMAL HIGH (ref 65–99)
Glucose-Capillary: 116 mg/dL — ABNORMAL HIGH (ref 65–99)

## 2014-11-29 LAB — TROPONIN I

## 2014-11-29 MED ORDER — NON FORMULARY: 20.0000 mg | Freq: Every day | Status: DC

## 2014-11-29 MED ORDER — OMEPRAZOLE 20 MG PO CPDR
20.0000 mg | DELAYED_RELEASE_CAPSULE | Freq: Every day | ORAL | Status: DC
  Administered 2014-11-30 – 2014-12-08 (×8): 20 mg via ORAL
  Filled 2014-11-29 (×10): qty 1

## 2014-11-29 NOTE — Progress Notes (Signed)
Subjective:   Patient denies any chest pain states breathing has improved but continues to have marked leg swelling.  Objective:  Vital Signs in the last 24 hours: Temp:  [97.3 F (36.3 C)-98.2 F (36.8 C)] 98.2 F (36.8 C) (05/21 0554) Pulse Rate:  [64-94] 64 (05/21 1035) Resp:  [18] 18 (05/21 0554) BP: (132-178)/(70-99) 139/85 mmHg (05/21 1035) SpO2:  [94 %-100 %] 100 % (05/21 0554) Weight:  [75.342 kg (166 lb 1.6 oz)-76.25 kg (168 lb 1.6 oz)] 76.25 kg (168 lb 1.6 oz) (05/21 0554)  Intake/Output from previous day: 05/20 0701 - 05/21 0700 In: 480 [P.O.:480] Out: 300 [Urine:300] Intake/Output from this shift:    Physical Exam: Neck: JVD - 8 cm above sternal notch, no adenopathy, no carotid bruit and supple, symmetrical, trachea midline Lungs: Bibasilar Rales noted Heart: regular rate and rhythm, S1, S2 normal and 2/6 systolic murmur and S3 gallop noted Abdomen: soft, non-tender; bowel sounds normal; no masses,  no organomegaly Extremities: No clubbing cyanosis 3+ edema noted  Lab Results:  Recent Labs  11/28/14 1633  WBC 5.2  HGB 14.6  PLT 223    Recent Labs  11/28/14 1633 11/29/14 0303  NA 139 139  K 4.0 4.2  CL 104 106  CO2 22 21*  GLUCOSE 107* 117*  BUN 52* 55*  CREATININE 1.69* 1.96*    Recent Labs  11/28/14 2016 11/29/14 0303  TROPONINI <0.03 <0.03   Hepatic Function Panel  Recent Labs  11/28/14 1633  PROT 7.2  ALBUMIN 3.2*  AST 24  ALT 16  ALKPHOS 82  BILITOT 3.3*   No results for input(s): CHOL in the last 72 hours. No results for input(s): PROTIME in the last 72 hours.  Imaging: Imaging results have been reviewed and Dg Chest 2 View  11/29/2014   CLINICAL DATA:  CHF.  EXAM: CHEST  2 VIEW  COMPARISON:  09/23/2014.  FINDINGS: Stable enlarged cardiac silhouette. Stable mild diffuse peribronchial thickening and accentuation of the interstitial markings. Decreased left basilar pleural fluid and atelectasis. Diffuse osteopenia.  IMPRESSION:  1. Decreased left pleural fluid and atelectasis. 2. Stable cardiomegaly and chronic bronchitic changes.   Electronically Signed   By: Beckie SaltsSteven  Reid M.D.   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 Restrict fluid to 1 L per 24 hours  LOS: 1 day    Rinaldo CloudHarwani, Keyasha Miah 11/29/2014, 12:27 PM

## 2014-11-30 LAB — BASIC METABOLIC PANEL
ANION GAP: 9 (ref 5–15)
BUN: 59 mg/dL — ABNORMAL HIGH (ref 6–20)
CALCIUM: 8.4 mg/dL — AB (ref 8.9–10.3)
CO2: 25 mmol/L (ref 22–32)
Chloride: 103 mmol/L (ref 101–111)
Creatinine, Ser: 2.21 mg/dL — ABNORMAL HIGH (ref 0.44–1.00)
GFR calc non Af Amer: 23 mL/min — ABNORMAL LOW (ref >60)
GFR, EST AFRICAN AMERICAN: 26 mL/min — AB (ref >60)
GLUCOSE: 89 mg/dL (ref 65–99)
Potassium: 4.4 mmol/L (ref 3.5–5.1)
Sodium: 137 mmol/L (ref 135–145)

## 2014-11-30 LAB — GLUCOSE, CAPILLARY
GLUCOSE-CAPILLARY: 177 mg/dL — AB (ref 65–99)
GLUCOSE-CAPILLARY: 110 mg/dL — AB (ref 65–99)
Glucose-Capillary: 118 mg/dL — ABNORMAL HIGH (ref 65–99)
Glucose-Capillary: 152 mg/dL — ABNORMAL HIGH (ref 65–99)

## 2014-11-30 LAB — BRAIN NATRIURETIC PEPTIDE: B Natriuretic Peptide: 1345.4 pg/mL — ABNORMAL HIGH (ref 0.0–100.0)

## 2014-11-30 MED ORDER — ISOSORB DINITRATE-HYDRALAZINE 20-37.5 MG PO TABS
1.0000 | ORAL_TABLET | Freq: Two times a day (BID) | ORAL | Status: DC
  Administered 2014-11-30 – 2014-12-08 (×17): 1 via ORAL
  Filled 2014-11-30 (×18): qty 1

## 2014-11-30 NOTE — Progress Notes (Signed)
Subjective:  Denies any chest pain states breathing is improving slowly but continues to have marked swelling of legs.  Objective:  Vital Signs in the last 24 hours: Temp:  [97.3 F (36.3 C)-98.7 F (37.1 C)] 98.7 F (37.1 C) (05/22 0503) Pulse Rate:  [59-66] 60 (05/22 0503) Resp:  [20] 20 (05/22 0503) BP: (121-143)/(71-85) 133/80 mmHg (05/22 0503) SpO2:  [96 %-100 %] 97 % (05/22 0503) Weight:  [76.658 kg (169 lb)] 76.658 kg (169 lb) (05/22 0503)  Intake/Output from previous day: 05/21 0701 - 05/22 0700 In: 1650 [P.O.:1650] Out: 950 [Urine:950] Intake/Output from this shift: Total I/O In: 120 [P.O.:120] Out: -   Physical Exam: Neck: no adenopathy, no carotid bruit, no JVD and supple, symmetrical, trachea midline Lungs: Bibasilar faint rales noted Heart: regular rate and rhythm, S1, S2 normal and Soft systolic murmur noted Abdomen: soft, non-tender; bowel sounds normal; no masses,  no organomegaly Extremities: No clubbing cyanosis 3+ edema noted  Lab Results:  Recent Labs  11/28/14 1633  WBC 5.2  HGB 14.6  PLT 223    Recent Labs  11/29/14 0303 11/30/14 0445  NA 139 137  K 4.2 4.4  CL 106 103  CO2 21* 25  GLUCOSE 117* 89  BUN 55* 59*  CREATININE 1.96* 2.21*    Recent Labs  11/28/14 2016 11/29/14 0303  TROPONINI <0.03 <0.03   Hepatic Function Panel  Recent Labs  11/28/14 1633  PROT 7.2  ALBUMIN 3.2*  AST 24  ALT 16  ALKPHOS 82  BILITOT 3.3*   No results for input(s): CHOL in the last 72 hours. No results for input(s): PROTIME in the last 72 hours.  Imaging: Imaging results have been reviewed and Dg Chest 2 View  11/29/2014   CLINICAL DATA:  CHF.  EXAM: CHEST  2 VIEW  COMPARISON:  09/23/2014.  FINDINGS: Stable enlarged cardiac silhouette. Stable mild diffuse peribronchial thickening and accentuation of the interstitial markings. Decreased left basilar pleural fluid and atelectasis. Diffuse osteopenia.  IMPRESSION: 1. Decreased left pleural  fluid and atelectasis. 2. Stable cardiomegaly and chronic bronchitic changes.   Electronically Signed   By: Beckie SaltsSteven  Reid M.D.   On: 11/29/2014 10:14    Cardiac Studies:  Assessment/Plan:  Acute biventricular systolic heart failure Coronary artery disease history of anteroseptal wall MI in the past Status post PCI to proximal LAD Hypertension Diabetes mellitus Acute on chronic kidney disease stage III Hypoalbuminemia Anxiety disorder Plan DC Norvasc in view of depressed LV systolic function Start BiDil as per orders Check labs in a.m.  LOS: 2 days    Rinaldo CloudHarwani, Farron Lafond 11/30/2014, 12:47 PM

## 2014-12-01 LAB — BASIC METABOLIC PANEL
Anion gap: 9 (ref 5–15)
BUN: 62 mg/dL — ABNORMAL HIGH (ref 6–20)
CALCIUM: 8.2 mg/dL — AB (ref 8.9–10.3)
CO2: 22 mmol/L (ref 22–32)
CREATININE: 2.38 mg/dL — AB (ref 0.44–1.00)
Chloride: 105 mmol/L (ref 101–111)
GFR calc Af Amer: 24 mL/min — ABNORMAL LOW (ref >60)
GFR, EST NON AFRICAN AMERICAN: 21 mL/min — AB (ref >60)
Glucose, Bld: 110 mg/dL — ABNORMAL HIGH (ref 65–99)
Potassium: 4.7 mmol/L (ref 3.5–5.1)
SODIUM: 136 mmol/L (ref 135–145)

## 2014-12-01 LAB — GLUCOSE, CAPILLARY
GLUCOSE-CAPILLARY: 108 mg/dL — AB (ref 65–99)
Glucose-Capillary: 178 mg/dL — ABNORMAL HIGH (ref 65–99)
Glucose-Capillary: 124 mg/dL — ABNORMAL HIGH (ref 65–99)
Glucose-Capillary: 106 mg/dL — ABNORMAL HIGH (ref 65–99)

## 2014-12-01 LAB — BRAIN NATRIURETIC PEPTIDE: B NATRIURETIC PEPTIDE 5: 1086.1 pg/mL — AB (ref 0.0–100.0)

## 2014-12-01 MED ORDER — FUROSEMIDE 10 MG/ML IJ SOLN
40.0000 mg | Freq: Every day | INTRAMUSCULAR | Status: DC
  Administered 2014-12-02 – 2014-12-03 (×2): 40 mg via INTRAVENOUS
  Filled 2014-12-01 (×4): qty 4

## 2014-12-01 NOTE — Progress Notes (Signed)
Subjective:  Breathing and leg swelling slowly improving denies any chest pains. Renal function progressively getting worst. Patient is off ace inhibitors  Objective:  Vital Signs in the last 24 hours: Temp:  [97.3 F (36.3 C)-97.6 F (36.4 C)] 97.6 F (36.4 C) (05/23 0550) Pulse Rate:  [59-62] 60 (05/23 1027) Resp:  [18-20] 20 (05/23 0550) BP: (115-127)/(58-70) 127/70 mmHg (05/23 1027) SpO2:  [98 %-100 %] 99 % (05/23 0550) Weight:  [76.975 kg (169 lb 11.2 oz)] 76.975 kg (169 lb 11.2 oz) (05/23 0550)  Intake/Output from previous day: 05/22 0701 - 05/23 0700 In: 1200 [P.O.:1200] Out: 2000 [Urine:2000] Intake/Output from this shift: Total I/O In: 120 [P.O.:120] Out: 800 [Urine:800]  Physical Exam: Neck: no adenopathy, no carotid bruit, no JVD and supple, symmetrical, trachea midline Lungs: Decreased breath sound at bases Heart: regular rate and rhythm, S1, S2 normal and Soft systolic murmur noted Abdomen: soft, non-tender; bowel sounds normal; no masses,  no organomegaly Extremities: No clubbing cyanosis 2+ edema noted  Lab Results:  Recent Labs  11/28/14 1633  WBC 5.2  HGB 14.6  PLT 223    Recent Labs  11/30/14 0445 12/01/14 0530  NA 137 136  K 4.4 4.7  CL 103 105  CO2 25 22  GLUCOSE 89 110*  BUN 59* 62*  CREATININE 2.21* 2.38*    Recent Labs  11/28/14 2016 11/29/14 0303  TROPONINI <0.03 <0.03   Hepatic Function Panel  Recent Labs  11/28/14 1633  PROT 7.2  ALBUMIN 3.2*  AST 24  ALT 16  ALKPHOS 82  BILITOT 3.3*   No results for input(s): CHOL in the last 72 hours. No results for input(s): PROTIME in the last 72 hours.  Imaging: Imaging results have been reviewed and No results found.  Cardiac Studie s:  Assessment/Plan:  Resolving Acute biventricular systolic heart failure Coronary artery disease history of anteroseptal wall MI in the past Status post PCI to proximal LAD Hypertension Diabetes mellitus Acute on chronic kidney disease  stage III Hypoalbuminemia Anxiety disorder Plan Reduce Lasix as per orders Check labs in a.m.  LOS: 3 days    [name], [name] 12/01/2014, 12:17 PM

## 2014-12-01 NOTE — Care Management Note (Signed)
Case Management Note  Patient Details  Name: Tanya SpikeMargaret A Hollingsworth MRN: [id number] Date of Birth: 11/27/1950  Subjective/Objective:         Admitted with CHF           Action/Plan: CM following for DCP  Expected Discharge Date:     Possibly 12/04/2014             Expected Discharge Plan:   home when stable   Status of Service:    In progress  Additional Comments:  Reola MosherChandler, Eryn Marandola L, RN,BSN,MHA [phone] 12/01/2014, 10:57 AM

## 2014-12-01 NOTE — Clinical Documentation Improvement (Signed)
Supporting Information: Acute on CKD, stage III, hold Ace inhibitor in view of worsening renal function for now per 5/21 progress notes.  Labs:   Bun      Creat      GFR: 5/23:     62         2.38        24 5/22:     59         2.21        26 5/21:     55         1.96        30   Possible Clinical Condition: . Document underlying condition(s) contributing/causing acute renal failure if known or suspected . Document if acute kidney injury (AKI) is due to traumatic injury or if due to a non-traumatic event . Document if acute renal failure is due to: --Acute tubular necrosis (ATN) --Acute cortical necrosis --Acute medullary necrosis --Other (specify) . Be specific with documentation --Acute renal insufficiency and acute kidney disease are not reported as acute renal failure . Document any associated diagnoses/conditions    Thank Gabriel CirriYou,  Miaa Latterell Mathews-Bethea,RN,BSN,Clinical Documentation Specialist [phone] Admire [email or web address]

## 2014-12-02 LAB — BASIC METABOLIC PANEL
Anion gap: 11 (ref 5–15)
BUN: 60 mg/dL — AB (ref 6–20)
CALCIUM: 8.5 mg/dL — AB (ref 8.9–10.3)
CHLORIDE: 103 mmol/L (ref 101–111)
CO2: 22 mmol/L (ref 22–32)
Creatinine, Ser: 2.08 mg/dL — ABNORMAL HIGH (ref 0.44–1.00)
GFR calc Af Amer: 28 mL/min — ABNORMAL LOW (ref >60)
GFR, EST NON AFRICAN AMERICAN: 24 mL/min — AB (ref >60)
GLUCOSE: 154 mg/dL — AB (ref 65–99)
Potassium: 4.8 mmol/L (ref 3.5–5.1)
Sodium: 136 mmol/L (ref 135–145)

## 2014-12-02 LAB — GLUCOSE, CAPILLARY
GLUCOSE-CAPILLARY: 111 mg/dL — AB (ref 65–99)
Glucose-Capillary: 169 mg/dL — ABNORMAL HIGH (ref 65–99)
Glucose-Capillary: 93 mg/dL (ref 65–99)
Glucose-Capillary: 125 mg/dL — ABNORMAL HIGH (ref 65–99)

## 2014-12-02 LAB — BRAIN NATRIURETIC PEPTIDE: B Natriuretic Peptide: 1530.3 pg/mL — ABNORMAL HIGH (ref 0.0–100.0)

## 2014-12-02 LAB — CBC
HCT: 43.3 % (ref 36.0–46.0)
Hemoglobin: 14.3 g/dL (ref 12.0–15.0)
MCH: 26.2 pg (ref 26.0–34.0)
MCHC: 33 g/dL (ref 30.0–36.0)
MCV: 79.4 fL (ref 78.0–100.0)
Platelets: 217 10*3/uL (ref 150–400)
RBC: 5.45 MIL/uL — ABNORMAL HIGH (ref 3.87–5.11)
RDW: 18.7 % — ABNORMAL HIGH (ref 11.5–15.5)
WBC: 5 10*3/uL (ref 4.0–10.5)

## 2014-12-02 NOTE — Progress Notes (Signed)
Ref: Augustina Braddock S, MD   Subjective:  Slow and steady diuresis. Breathing better. Minimal improvement in renal function.  Objective:  Vital Signs in the last 24 hours: Temp:  [97.5 F (36.4 C)-98.1 F (36.7 C)] 97.8 F (36.6 C) (05/24 2021) Pulse Rate:  [53-63] 60 (05/24 2021) Cardiac Rhythm:  [-] Normal sinus rhythm (05/24 1032) Resp:  [18-20] 18 (05/24 2021) BP: (127-148)/(63-78) 148/74 mmHg (05/24 2021) SpO2:  [97 %-98 %] 98 % (05/24 2021) Weight:  [76.93 kg (169 lb 9.6 oz)] 76.93 kg (169 lb 9.6 oz) (05/24 1610)  Physical Exam: BP Readings from Last 1 Encounters:  12/02/14 148/74    Wt Readings from Last 1 Encounters:  12/02/14 76.93 kg (169 lb 9.6 oz)    Weight change: -0.045 kg (-1.6 oz)  HEENT: [place]/AT, Eyes-Brown, PERL, EOMI, Conjunctiva-Pink, Sclera-Non-icteric Neck: + JVD, No bruit, Trachea midline. Lungs:  Clearing, Bilateral. Cardiac:  Regular rhythm, normal S1 and S2, no S3. II/VI systolic murmur. Abdomen:  Soft, non-tender. Back: 2 + pre-sacral edema. Extremities:  2 + edema present. No cyanosis. No clubbing. CNS: AxOx3, Cranial nerves grossly intact, moves all 4 extremities. Right handed. Skin: Warm and dry.   Intake/Output from previous day: 05/23 0701 - 05/24 0700 In: 1040 [P.O.:1040] Out: 2550 [Urine:2550]    Lab Results: BMET    Component Value Date/Time   NA 136 12/02/2014 0945   NA 136 12/01/2014 0530   NA 137 11/30/2014 0445   K 4.8 12/02/2014 0945   K 4.7 12/01/2014 0530   K 4.4 11/30/2014 0445   CL 103 12/02/2014 0945   CL 105 12/01/2014 0530   CL 103 11/30/2014 0445   CO2 22 12/02/2014 0945   CO2 22 12/01/2014 0530   CO2 25 11/30/2014 0445   GLUCOSE 154* 12/02/2014 0945   GLUCOSE 110* 12/01/2014 0530   GLUCOSE 89 11/30/2014 0445   BUN 60* 12/02/2014 0945   BUN 62* 12/01/2014 0530   BUN 59* 11/30/2014 0445   CREATININE 2.08* 12/02/2014 0945   CREATININE 2.38* 12/01/2014 0530   CREATININE 2.21* 11/30/2014 0445   CALCIUM 8.5*  12/02/2014 0945   CALCIUM 8.2* 12/01/2014 0530   CALCIUM 8.4* 11/30/2014 0445   GFRNONAA 24* 12/02/2014 0945   GFRNONAA 21* 12/01/2014 0530   GFRNONAA 23* 11/30/2014 0445   GFRAA 28* 12/02/2014 0945   GFRAA 24* 12/01/2014 0530   GFRAA 26* 11/30/2014 0445   CBC    Component Value Date/Time   WBC 5.0 12/02/2014 0945   RBC 5.45* 12/02/2014 0945   HGB 14.3 12/02/2014 0945   HCT 43.3 12/02/2014 0945   PLT 217 12/02/2014 0945   MCV 79.4 12/02/2014 0945   MCH 26.2 12/02/2014 0945   MCHC 33.0 12/02/2014 0945   RDW 18.7* 12/02/2014 0945   LYMPHSABS 1.6 11/28/2014 1633   MONOABS 0.5 11/28/2014 1633   EOSABS 0.0 11/28/2014 1633   BASOSABS 0.0 11/28/2014 1633   HEPATIC Function Panel  Recent Labs  08/12/14 1403 09/22/14 1202 11/28/14 1633  PROT 6.5 6.9 7.2   HEMOGLOBIN A1C No components found for: HGA1C,  MPG CARDIAC ENZYMES Lab Results  Component Value Date   CKTOTAL 59 09/07/2012   CKMB 3.2 09/07/2012   TROPONINI <0.03 11/29/2014   TROPONINI <0.03 11/28/2014   TROPONINI <0.03 11/28/2014   BNP  Recent Labs  04/02/14 0843 04/09/14 0451 06/19/14 1420  PROBNP 11219.0* 8206.0* 14027.0*   TSH  Recent Labs  02/03/14 1239  TSH 1.990   CHOLESTEROL No results for input(s):  CHOL in the last 8760 hours.  Scheduled Meds: . allopurinol  100 mg Oral BID  . aspirin EC  81 mg Oral Daily  . carvedilol  3.125 mg Oral BID WC  . digoxin  0.0625 mg Oral Daily  . furosemide  40 mg Intravenous Daily  . heparin  5,000 Units Subcutaneous 3 times per day  . insulin aspart  0-15 Units Subcutaneous TID WC  . isosorbide-hydrALAZINE  1 tablet Oral BID  . linagliptin  5 mg Oral Daily  . omeprazole  20 mg Oral Daily  . sodium chloride  3 mL Intravenous Q12H  . spironolactone  25 mg Oral QHS   Continuous Infusions:  PRN Meds:.sodium chloride, acetaminophen, nitroGLYCERIN, ondansetron (ZOFRAN) IV, sodium chloride  Assessment/Plan: Resolving Acute on chronic biventricular  systolic heart failure Coronary artery disease history of anteroseptal wall MI in the past Status post PCI to proximal LAD Hypertension Diabetes mellitus, II Acute kidney injury secondary to ACE-inhibitor and diuretic use Chronic kidney disease stage III Hypoalbuminemia Anxiety disorder  Continue medical treatment.     LOS: 4 days    Orpah CobbAjay Dnya Hickle  MD  12/02/2014, 10:32 PM

## 2014-12-03 LAB — GLUCOSE, CAPILLARY
GLUCOSE-CAPILLARY: 137 mg/dL — AB (ref 65–99)
Glucose-Capillary: 101 mg/dL — ABNORMAL HIGH (ref 65–99)
Glucose-Capillary: 86 mg/dL (ref 65–99)
Glucose-Capillary: 130 mg/dL — ABNORMAL HIGH (ref 65–99)

## 2014-12-03 MED ORDER — ALBUMIN HUMAN 25 % IV SOLN
12.5000 g | Freq: Once | INTRAVENOUS | Status: AC
  Administered 2014-12-03: 12.5 g via INTRAVENOUS
  Filled 2014-12-03: qty 50

## 2014-12-03 NOTE — Progress Notes (Signed)
Ref: [name],Akili Corsetti S, MD   Subjective:  Feeling better. Upset over stuck few times for blood draw. No chest pain.  Objective:  Vital Signs in the last 24 hours: Temp:  [97.5 F (36.4 C)-98.1 F (36.7 C)] 98.1 F (36.7 C) (05/25 0624) Pulse Rate:  [59-63] 60 (05/25 1110) Cardiac Rhythm:  [-] Normal sinus rhythm (05/25 1012) Resp:  [18-20] 20 (05/25 0624) BP: (140-148)/(64-78) 140/64 mmHg (05/25 1110) SpO2:  [98 %-100 %] 100 % (05/25 0624) Weight:  [75.252 kg (165 lb 14.4 oz)] 75.252 kg (165 lb 14.4 oz) (05/25 16100624)  Physical Exam: BP Readings from Last 1 Encounters:  12/03/14 140/64    Wt Readings from Last 1 Encounters:  12/03/14 75.252 kg (165 lb 14.4 oz)    Weight change: -1.678 kg (-3 lb 11.2 oz)  HEENT: [place]/AT, Eyes-Brown, PERL, EOMI, Conjunctiva-Pink, Sclera-Non-icteric Neck: + JVD, No bruit, Trachea midline. Lungs:  Clearing, Bilateral. Cardiac:  Regular rhythm, normal S1 and S2, no S3. II/VI systolic murmur. Abdomen:  Soft, non-tender. Extremities:  2 + edema present. No cyanosis. No clubbing. CNS: AxOx3, Cranial nerves grossly intact, moves all 4 extremities. Right handed. Skin: Warm and dry.   Intake/Output from previous day: 05/24 0701 - 05/25 0700 In: 942 [P.O.:942] Out: 2550 [Urine:2550]    Lab Results: BMET    Component Value Date/Time   NA 136 12/02/2014 0945   NA 136 12/01/2014 0530   NA 137 11/30/2014 0445   K 4.8 12/02/2014 0945   K 4.7 12/01/2014 0530   K 4.4 11/30/2014 0445   CL 103 12/02/2014 0945   CL 105 12/01/2014 0530   CL 103 11/30/2014 0445   CO2 22 12/02/2014 0945   CO2 22 12/01/2014 0530   CO2 25 11/30/2014 0445   GLUCOSE 154* 12/02/2014 0945   GLUCOSE 110* 12/01/2014 0530   GLUCOSE 89 11/30/2014 0445   BUN 60* 12/02/2014 0945   BUN 62* 12/01/2014 0530   BUN 59* 11/30/2014 0445   CREATININE 2.08* 12/02/2014 0945   CREATININE 2.38* 12/01/2014 0530   CREATININE 2.21* 11/30/2014 0445   CALCIUM 8.5* 12/02/2014 0945   CALCIUM  8.2* 12/01/2014 0530   CALCIUM 8.4* 11/30/2014 0445   GFRNONAA 24* 12/02/2014 0945   GFRNONAA 21* 12/01/2014 0530   GFRNONAA 23* 11/30/2014 0445   GFRAA 28* 12/02/2014 0945   GFRAA 24* 12/01/2014 0530   GFRAA 26* 11/30/2014 0445   CBC    Component Value Date/Time   WBC 5.0 12/02/2014 0945   RBC 5.45* 12/02/2014 0945   HGB 14.3 12/02/2014 0945   HCT 43.3 12/02/2014 0945   PLT 217 12/02/2014 0945   MCV 79.4 12/02/2014 0945   MCH 26.2 12/02/2014 0945   MCHC 33.0 12/02/2014 0945   RDW 18.7* 12/02/2014 0945   LYMPHSABS 1.6 11/28/2014 1633   MONOABS 0.5 11/28/2014 1633   EOSABS 0.0 11/28/2014 1633   BASOSABS 0.0 11/28/2014 1633   HEPATIC Function Panel  Recent Labs  08/12/14 1403 09/22/14 1202 11/28/14 1633  PROT 6.5 6.9 7.2   HEMOGLOBIN A1C No components found for: HGA1C,  MPG CARDIAC ENZYMES Lab Results  Component Value Date   CKTOTAL 59 09/07/2012   CKMB 3.2 09/07/2012   TROPONINI <0.03 11/29/2014   TROPONINI <0.03 11/28/2014   TROPONINI <0.03 11/28/2014   BNP  Recent Labs  04/02/14 0843 04/09/14 0451 06/19/14 1420  PROBNP 11219.0* 8206.0* 14027.0*   TSH  Recent Labs  02/03/14 1239  TSH 1.990   CHOLESTEROL No results for input(s): CHOL  in the last 8760 hours.  Scheduled Meds: . albumin human  12.5 g Intravenous Once  . allopurinol  100 mg Oral BID  . aspirin EC  81 mg Oral Daily  . carvedilol  3.125 mg Oral BID WC  . digoxin  0.0625 mg Oral Daily  . furosemide  40 mg Intravenous Daily  . heparin  5,000 Units Subcutaneous 3 times per day  . insulin aspart  0-15 Units Subcutaneous TID WC  . isosorbide-hydrALAZINE  1 tablet Oral BID  . linagliptin  5 mg Oral Daily  . omeprazole  20 mg Oral Daily  . sodium chloride  3 mL Intravenous Q12H  . spironolactone  25 mg Oral QHS   Continuous Infusions:  PRN Meds:.sodium chloride, acetaminophen, nitroGLYCERIN, ondansetron (ZOFRAN) IV, sodium chloride  Assessment/Plan: Resolving Acute on chronic  biventricular systolic heart failure Coronary artery disease history of anteroseptal wall MI in the past Status post PCI to proximal LAD Hypertension Diabetes mellitus, II Acute kidney injury secondary to ACE-inhibitor and diuretic use Chronic kidney disease stage III Hypoalbuminemia Anxiety disorder  Continue diuresis.     LOS: 5 days    [name]  MD  12/03/2014, 3:47 PM

## 2014-12-03 NOTE — Care Management Note (Signed)
Case Management Note  Patient Details  Name: Tanya SpikeMargaret A [name] MRN: [id number] Date of Birth: [birth year]  Subjective/Objective:   Admitted with CHF                 Action/Plan: Patient lives at home with spouse, independent of ALD's. Cardiologist Dr Algie CofferKadakia, has private insurance with [employer] [facility] [employer] with prescription drug coverage; Pharmacy of choice is [facility]. Patient stated that she and her spouse are on a fixed income and have a hard time affording her medication. CM gave patient an application for medication assistance for her to fill out at home and mail in to possible qualify for medication assistance. Patient also stated that she has scales at home and weighs herself daily.  Expected Discharge Date:    12/04/2014              Expected Discharge Plan:   Home      Status of Service:   In progress  Reola MosherChandler, Denene Alamillo L, RN,BSN,MHA [phone] 12/03/2014, 3:44 PM

## 2014-12-04 LAB — CBC
HCT: 42.2 % (ref 36.0–46.0)
Hemoglobin: 13.9 g/dL (ref 12.0–15.0)
MCH: 26.2 pg (ref 26.0–34.0)
MCHC: 32.9 g/dL (ref 30.0–36.0)
MCV: 79.6 fL (ref 78.0–100.0)
Platelets: UNDETERMINED 10*3/uL (ref 150–400)
RBC: 5.3 MIL/uL — ABNORMAL HIGH (ref 3.87–5.11)
RDW: 18.4 % — ABNORMAL HIGH (ref 11.5–15.5)
WBC: 4.1 10*3/uL (ref 4.0–10.5)

## 2014-12-04 LAB — COMPREHENSIVE METABOLIC PANEL
ALBUMIN: 3 g/dL — AB (ref 3.5–5.0)
ALK PHOS: 75 U/L (ref 38–126)
ALT: 14 U/L (ref 14–54)
ANION GAP: 11 (ref 5–15)
AST: 29 U/L (ref 15–41)
BILIRUBIN TOTAL: 2.2 mg/dL — AB (ref 0.3–1.2)
BUN: 47 mg/dL — ABNORMAL HIGH (ref 6–20)
CO2: 23 mmol/L (ref 22–32)
Calcium: 8.8 mg/dL — ABNORMAL LOW (ref 8.9–10.3)
Chloride: 104 mmol/L (ref 101–111)
Creatinine, Ser: 1.82 mg/dL — ABNORMAL HIGH (ref 0.44–1.00)
GFR calc Af Amer: 33 mL/min — ABNORMAL LOW (ref >60)
GFR calc non Af Amer: 29 mL/min — ABNORMAL LOW (ref >60)
Glucose, Bld: 75 mg/dL (ref 65–99)
POTASSIUM: 5.8 mmol/L — AB (ref 3.5–5.1)
SODIUM: 138 mmol/L (ref 135–145)
Total Protein: 6.4 g/dL — ABNORMAL LOW (ref 6.5–8.1)

## 2014-12-04 LAB — GLUCOSE, CAPILLARY
GLUCOSE-CAPILLARY: 106 mg/dL — AB (ref 65–99)
GLUCOSE-CAPILLARY: 92 mg/dL (ref 65–99)
Glucose-Capillary: 95 mg/dL (ref 65–99)
Glucose-Capillary: 145 mg/dL — ABNORMAL HIGH (ref 65–99)

## 2014-12-04 MED ORDER — FUROSEMIDE 10 MG/ML IJ SOLN
40.0000 mg | Freq: Two times a day (BID) | INTRAMUSCULAR | Status: DC
  Administered 2014-12-04: 40 mg via INTRAVENOUS

## 2014-12-04 MED ORDER — FUROSEMIDE 10 MG/ML IJ SOLN
40.0000 mg | Freq: Two times a day (BID) | INTRAMUSCULAR | Status: DC
  Administered 2014-12-04 – 2014-12-07 (×6): 40 mg via INTRAVENOUS
  Filled 2014-12-04 (×9): qty 4

## 2014-12-04 NOTE — Evaluation (Signed)
  Occupational Therapy Evaluation Patient Details Name: Tanya SpikeMargaret A [name] MRN: [id number] DOB: [birth year] Today's Date: 12/04/2014    History of Present Illness 64 year old female presents with leg edema and progressive worsening of shortness of breath and 10-15 pound weight gain x 2-3 weeks has past medical history of acute biventricular systolic heart failure, CAD, status post stent in LAD, diabetes mellitus type 2, hypertension and anxiety. No fever or chest pain. Has some medications and dietary non-compliance off and on.   Clinical Impression   Pt at baseline with ADL activity . No further OT needed    Follow Up Recommendations  No OT follow up    Equipment Recommendations  None recommended by OT       Precautions / Restrictions Precautions Precautions: None      Mobility Bed Mobility               General bed mobility comments: pt inchair  Transfers Overall transfer level: Independent                         ADL Overall ADL's : At baseline        OT did provide pt energy conservation education with ADL activity . Pt able to verbalize understanding                                               Pertinent Vitals/Pain Pain Assessment: No/denies pain        Extremity/Trunk Assessment Upper Extremity Assessment Upper Extremity Assessment: Generalized weakness           Communication Communication Communication: No difficulties   Cognition Arousal/Alertness: Awake/alert Behavior During Therapy: WFL for tasks assessed/performed Overall Cognitive Status: Within Functional Limits for tasks assessed                                Home Living Family/patient expects to be discharged to:: Private residence Living Arrangements: Spouse/significant other Available Help at Discharge: Family;Available 24 hours/day Type of Home: House Home Access: Level entry     Home Layout: One level     Bathroom Shower/Tub:  Tub/shower unit Shower/tub characteristics: Door                  Prior Functioning/Environment Level of Independence: Independent                                       End of Session    Activity Tolerance: Patient tolerated treatment well Patient left: in chair;with call bell/phone within reach   Time: 1048-1056 OT Time Calculation (min): 8 min Charges:  OT General Charges $OT Visit: 1 Procedure OT Evaluation $Initial OT Evaluation Tier I: 1 Procedure G-Codes:    Alba CoryEDDING, Keenan Trefry D 12/04/2014, 1:08 PM

## 2014-12-04 NOTE — Progress Notes (Signed)
Ref: Tanya Hund S, MD   Subjective:  Good diuresis with IV lasix and IV albumin combination. Afebrile.  Objective:  Vital Signs in the last 24 hours: Temp:  [97.8 F (36.6 C)-98.6 F (37 C)] 98.5 F (36.9 C) (05/26 0823) Pulse Rate:  [60-68] 66 (05/26 0823) Cardiac Rhythm:  [-] Normal sinus rhythm (05/26 0826) Resp:  [18-20] 18 (05/26 0823) BP: (138-161)/(61-83) 138/61 mmHg (05/26 0823) SpO2:  [98 %-100 %] 100 % (05/26 0823) Weight:  [73.3 kg (161 lb 9.6 oz)] 73.3 kg (161 lb 9.6 oz) (05/26 0604)  Physical Exam: BP Readings from Last 1 Encounters:  12/04/14 138/61    Wt Readings from Last 1 Encounters:  12/04/14 73.3 kg (161 lb 9.6 oz)    Weight change: -1.952 kg (-4 lb 4.9 oz)  HEENT: [place]/AT, Eyes-Brown, PERL, EOMI, Conjunctiva-Pink, Sclera-Non-icteric Neck: + JVD, No bruit, Trachea midline. Lungs:  Clearing, Bilateral. Cardiac:  Regular rhythm, normal S1 and S2, no S3.  Abdomen:  Soft, non-tender.  Back 2 + presacral edema. Extremities:  2 + edema present. No cyanosis. No clubbing. CNS: AxOx3, Cranial nerves grossly intact, moves all 4 extremities. Right handed. Skin: Warm and dry.   Intake/Output from previous day: 05/25 0701 - 05/26 0700 In: 880 [P.O.:830; IV Piggyback:50] Out: 4750 [Urine:4750]    Lab Results: BMET    Component Value Date/Time   NA 138 12/04/2014 0505   NA 136 12/02/2014 0945   NA 136 12/01/2014 0530   K 5.8* 12/04/2014 0505   K 4.8 12/02/2014 0945   K 4.7 12/01/2014 0530   CL 104 12/04/2014 0505   CL 103 12/02/2014 0945   CL 105 12/01/2014 0530   CO2 23 12/04/2014 0505   CO2 22 12/02/2014 0945   CO2 22 12/01/2014 0530   GLUCOSE 75 12/04/2014 0505   GLUCOSE 154* 12/02/2014 0945   GLUCOSE 110* 12/01/2014 0530   BUN 47* 12/04/2014 0505   BUN 60* 12/02/2014 0945   BUN 62* 12/01/2014 0530   CREATININE 1.82* 12/04/2014 0505   CREATININE 2.08* 12/02/2014 0945   CREATININE 2.38* 12/01/2014 0530   CALCIUM 8.8* 12/04/2014 0505   CALCIUM 8.5* 12/02/2014 0945   CALCIUM 8.2* 12/01/2014 0530   GFRNONAA 29* 12/04/2014 0505   GFRNONAA 24* 12/02/2014 0945   GFRNONAA 21* 12/01/2014 0530   GFRAA 33* 12/04/2014 0505   GFRAA 28* 12/02/2014 0945   GFRAA 24* 12/01/2014 0530   CBC    Component Value Date/Time   WBC 4.1 12/04/2014 0505   RBC 5.30* 12/04/2014 0505   HGB 13.9 12/04/2014 0505   HCT 42.2 12/04/2014 0505   PLT PLATELET CLUMPS NOTED ON SMEAR, UNABLE TO ESTIMATE 12/04/2014 0505   MCV 79.6 12/04/2014 0505   MCH 26.2 12/04/2014 0505   MCHC 32.9 12/04/2014 0505   RDW 18.4* 12/04/2014 0505   LYMPHSABS 1.6 11/28/2014 1633   MONOABS 0.5 11/28/2014 1633   EOSABS 0.0 11/28/2014 1633   BASOSABS 0.0 11/28/2014 1633   HEPATIC Function Panel  Recent Labs  09/22/14 1202 11/28/14 1633 12/04/14 0505  PROT 6.9 7.2 6.4*   HEMOGLOBIN A1C No components found for: HGA1C,  MPG CARDIAC ENZYMES Lab Results  Component Value Date   CKTOTAL 59 09/07/2012   CKMB 3.2 09/07/2012   TROPONINI <0.03 11/29/2014   TROPONINI <0.03 11/28/2014   TROPONINI <0.03 11/28/2014   BNP  Recent Labs  04/02/14 0843 04/09/14 0451 06/19/14 1420  PROBNP 11219.0* 8206.0* 14027.0*   TSH  Recent Labs  02/03/14 1239  TSH  1.990   CHOLESTEROL No results for input([name]): CHOL in the last 8760 hours.  Scheduled Meds: . allopurinol  100 mg Oral BID  . aspirin EC  81 mg Oral Daily  . carvedilol  3.125 mg Oral BID WC  . digoxin  0.0625 mg Oral Daily  . furosemide  40 mg Intravenous Q12H  . heparin  5,000 Units Subcutaneous 3 times per day  . insulin aspart  0-15 Units Subcutaneous TID WC  . isosorbide-hydrALAZINE  1 tablet Oral BID  . linagliptin  5 mg Oral Daily  . omeprazole  20 mg Oral Daily  . sodium chloride  3 mL Intravenous Q12H   Continuous Infusions:  PRN Meds:.sodium chloride, acetaminophen, nitroGLYCERIN, ondansetron (ZOFRAN) IV, sodium chloride  Assessment/Plan: Resolving Acute on chronic biventricular systolic  heart failure Coronary artery disease history of anteroseptal wall MI in the past Status post PCI to proximal LAD Hypertension Diabetes mellitus, II Acute kidney injury secondary to ACE-inhibitor and diuretic use Chronic kidney disease stage III Hypoalbuminemia Anxiety disorder Hyperkalemia  Increase Lasix. Stop spironolactone.    LOS: 6 days    Orpah CobbAjay Diego Delancey  MD  12/04/2014, 8:57 AM

## 2014-12-04 NOTE — Evaluation (Signed)
Physical Therapy Evaluation Patient Details Name: [name] MRN: [id number] DOB: [birth year] Today's Date: 12/04/2014   History of Present Illness  64 year old female presents with leg edema and progressive worsening of shortness of breath and 10-15 pound weight gain x 2-3 weeks has past medical history of acute biventricular systolic heart failure, CAD, status post stent in LAD, diabetes mellitus type 2, hypertension and anxiety. No fever or chest pain. Has some medications and dietary non-compliance off and on.  Clinical Impression  Patient is independent with all mobility and gait.  No further acute PT needs identified - PT will sign off.    Follow Up Recommendations No PT follow up;Supervision - Intermittent    Equipment Recommendations  None recommended by PT    Recommendations for Other Services       Precautions / Restrictions Precautions Precautions: None Restrictions Weight Bearing Restrictions: No      Mobility  Bed Mobility               General bed mobility comments: Patient in chair as PT entered room  Transfers Overall transfer level: Independent Equipment used: None                Ambulation/Gait Ambulation/Gait assistance: Modified independent (Device/Increase time) Ambulation Distance (Feet): 200 Feet Assistive device: None Gait Pattern/deviations: WFL(Within Functional Limits) Gait velocity: Slightly decreased Gait velocity interpretation: Below normal speed for age/gender General Gait Details: Patient demonstrates good gait pattern, balance, and speed  Stairs            Wheelchair Mobility    Modified Rankin (Stroke Patients Only)       Balance                             High level balance activites: Direction changes;Turns;Sudden stops;Head turns (Stepping over obstacles; speed changes) High Level Balance Comments: No loss of balance with high level balance activities             Pertinent  Vitals/Pain Pain Assessment: No/denies pain    Home Living Family/patient expects to be discharged to:: Private residence Living Arrangements: Spouse/significant other Available Help at Discharge: Family;Available 24 hours/day Type of Home: House Home Access: Level entry     Home Layout: One level Home Equipment: None      Prior Function Level of Independence: Independent               Hand Dominance   Dominant Hand: Right    Extremity/Trunk Assessment   Upper Extremity Assessment: Defer to OT evaluation           Lower Extremity Assessment: Overall WFL for tasks assessed      Cervical / Trunk Assessment: Normal  Communication   Communication: No difficulties  Cognition Arousal/Alertness: Awake/alert Behavior During Therapy: WFL for tasks assessed/performed Overall Cognitive Status: Within Functional Limits for tasks assessed                      General Comments      Exercises        Assessment/Plan    PT Assessment Patent does not need any further PT services  PT Diagnosis Abnormality of gait;Generalized weakness   PT Problem List    PT Treatment Interventions     PT Goals (Current goals can be found in the Care Plan section) Acute Rehab PT Goals PT Goal Formulation: All assessment and education complete, DC therapy  Frequency     Barriers to discharge        Co-evaluation               End of Session   Activity Tolerance: Patient tolerated treatment well Patient left: in chair;with call bell/phone within reach Nurse Communication: Mobility status         Time: 4098-1191 PT Time Calculation (min) (ACUTE ONLY): 10 min   Charges:   PT Evaluation $Initial PT Evaluation Tier I: 1 Procedure     PT G CodesVena Austria 12/10/2014, 2:05 PM Durenda Hurt. Renaldo Fiddler, [facility] Acute Rehab Services Pager [phone]

## 2014-12-05 LAB — BASIC METABOLIC PANEL
Anion gap: 9 (ref 5–15)
BUN: 43 mg/dL — AB (ref 6–20)
CALCIUM: 8.6 mg/dL — AB (ref 8.9–10.3)
CHLORIDE: 103 mmol/L (ref 101–111)
CO2: 23 mmol/L (ref 22–32)
CREATININE: 1.81 mg/dL — AB (ref 0.44–1.00)
GFR calc Af Amer: 33 mL/min — ABNORMAL LOW (ref >60)
GFR calc non Af Amer: 29 mL/min — ABNORMAL LOW (ref >60)
Glucose, Bld: 108 mg/dL — ABNORMAL HIGH (ref 65–99)
Potassium: 4.3 mmol/L (ref 3.5–5.1)
SODIUM: 135 mmol/L (ref 135–145)

## 2014-12-05 LAB — GLUCOSE, CAPILLARY
Glucose-Capillary: 112 mg/dL — ABNORMAL HIGH (ref 65–99)
Glucose-Capillary: 110 mg/dL — ABNORMAL HIGH (ref 65–99)
Glucose-Capillary: 113 mg/dL — ABNORMAL HIGH (ref 65–99)

## 2014-12-05 MED ORDER — CARVEDILOL 3.125 MG PO TABS
3.1250 mg | ORAL_TABLET | Freq: Two times a day (BID) | ORAL | Status: DC
  Administered 2014-12-05 – 2014-12-08 (×7): 3.125 mg via ORAL
  Filled 2014-12-05 (×9): qty 1

## 2014-12-05 NOTE — Progress Notes (Signed)
Ref: Tanya RodriguezKADAKIA,Tanya Ozga S, MD   Subjective:  Good diuresis yesterday. T max 99.2 degree F. No shortness of breath.  Objective:  Vital Signs in the last 24 hours: Temp:  [97.7 F (36.5 C)-99.2 F (37.3 C)] 97.7 F (36.5 C) (05/27 0818) Pulse Rate:  [62-77] 77 (05/27 0818) Cardiac Rhythm:  [-] Normal sinus rhythm (05/27 0825) Resp:  [18] 18 (05/27 0818) BP: (138-150)/(60-72) 149/60 mmHg (05/27 0818) SpO2:  [100 %] 100 % (05/27 0818) Weight:  [69.718 kg (153 lb 11.2 oz)] 69.718 kg (153 lb 11.2 oz) (05/27 0500)  Physical Exam: BP Readings from Last 1 Encounters:  12/05/14 149/60    Wt Readings from Last 1 Encounters:  12/05/14 69.718 kg (153 lb 11.2 oz)    Weight change: -3.582 kg (-7 lb 14.4 oz)  HEENT: [place]/AT, Eyes-Brown, PERL, EOMI, Conjunctiva-Pink, Sclera-Non-icteric Neck: No JVD, No bruit, Trachea midline. Lungs:  Clear, Bilateral. Cardiac:  Regular rhythm, normal S1 and S2, no S3.  Abdomen:  Soft, non-tender. Back-1 + pre-sacral edema. Extremities:  1 + edema present. No cyanosis. No clubbing. CNS: AxOx3, Cranial nerves grossly intact, moves all 4 extremities. Right handed. Skin: Warm and dry.   Intake/Output from previous day: 05/26 0701 - 05/27 0700 In: 1100 [P.O.:1100] Out: 5850 [Urine:5850]    Lab Results: BMET    Component Value Date/Time   NA 135 12/05/2014 0616   NA 138 12/04/2014 0505   NA 136 12/02/2014 0945   K 4.3 12/05/2014 0616   K 5.8* 12/04/2014 0505   K 4.8 12/02/2014 0945   CL 103 12/05/2014 0616   CL 104 12/04/2014 0505   CL 103 12/02/2014 0945   CO2 23 12/05/2014 0616   CO2 23 12/04/2014 0505   CO2 22 12/02/2014 0945   GLUCOSE 108* 12/05/2014 0616   GLUCOSE 75 12/04/2014 0505   GLUCOSE 154* 12/02/2014 0945   BUN 43* 12/05/2014 0616   BUN 47* 12/04/2014 0505   BUN 60* 12/02/2014 0945   CREATININE 1.81* 12/05/2014 0616   CREATININE 1.82* 12/04/2014 0505   CREATININE 2.08* 12/02/2014 0945   CALCIUM 8.6* 12/05/2014 0616   CALCIUM 8.8*  12/04/2014 0505   CALCIUM 8.5* 12/02/2014 0945   GFRNONAA 29* 12/05/2014 0616   GFRNONAA 29* 12/04/2014 0505   GFRNONAA 24* 12/02/2014 0945   GFRAA 33* 12/05/2014 0616   GFRAA 33* 12/04/2014 0505   GFRAA 28* 12/02/2014 0945   CBC    Component Value Date/Time   WBC 4.1 12/04/2014 0505   RBC 5.30* 12/04/2014 0505   HGB 13.9 12/04/2014 0505   HCT 42.2 12/04/2014 0505   PLT PLATELET CLUMPS NOTED ON SMEAR, UNABLE TO ESTIMATE 12/04/2014 0505   MCV 79.6 12/04/2014 0505   MCH 26.2 12/04/2014 0505   MCHC 32.9 12/04/2014 0505   RDW 18.4* 12/04/2014 0505   LYMPHSABS 1.6 11/28/2014 1633   MONOABS 0.5 11/28/2014 1633   EOSABS 0.0 11/28/2014 1633   BASOSABS 0.0 11/28/2014 1633   HEPATIC Function Panel  Recent Labs  09/22/14 1202 11/28/14 1633 12/04/14 0505  PROT 6.9 7.2 6.4*   HEMOGLOBIN A1C No components found for: HGA1C,  MPG CARDIAC ENZYMES Lab Results  Component Value Date   CKTOTAL 59 09/07/2012   CKMB 3.2 09/07/2012   TROPONINI <0.03 11/29/2014   TROPONINI <0.03 11/28/2014   TROPONINI <0.03 11/28/2014   BNP  Recent Labs  04/02/14 0843 04/09/14 0451 06/19/14 1420  PROBNP 11219.0* 8206.0* 14027.0*   TSH  Recent Labs  02/03/14 1239  TSH 1.990  CHOLESTEROL No results for input([name]): CHOL in the last 8760 hours.  Scheduled Meds: . allopurinol  100 mg Oral BID  . aspirin EC  81 mg Oral Daily  . carvedilol  3.125 mg Oral BID WC  . digoxin  0.0625 mg Oral Daily  . furosemide  40 mg Intravenous Q12H  . heparin  5,000 Units Subcutaneous 3 times per day  . insulin aspart  0-15 Units Subcutaneous TID WC  . isosorbide-hydrALAZINE  1 tablet Oral BID  . linagliptin  5 mg Oral Daily  . omeprazole  20 mg Oral Daily  . sodium chloride  3 mL Intravenous Q12H   Continuous Infusions:  PRN Meds:.sodium chloride, acetaminophen, nitroGLYCERIN, ondansetron (ZOFRAN) IV, sodium chloride  Assessment/Plan: Resolving Acute on chronic biventricular systolic heart  failure Coronary artery disease history of anteroseptal wall MI in the past Status post PCI to proximal LAD Hypertension Diabetes mellitus, II Acute kidney injury secondary to ACE-inhibitor and diuretic use Chronic kidney disease stage III Hypoalbuminemia Anxiety disorder Hyperkalemia-resolved  Continue IV lasix. Increase activity as tolerated.     LOS: 7 days    [name]  MD  12/05/2014, 12:46 PM

## 2014-12-06 LAB — GLUCOSE, CAPILLARY
GLUCOSE-CAPILLARY: 100 mg/dL — AB (ref 65–99)
Glucose-Capillary: 97 mg/dL (ref 65–99)
Glucose-Capillary: 145 mg/dL — ABNORMAL HIGH (ref 65–99)
Glucose-Capillary: 124 mg/dL — ABNORMAL HIGH (ref 65–99)

## 2014-12-06 NOTE — Progress Notes (Signed)
Ref: [name], MD   Subjective:  Additional diuresis. Lost about 19 pounds of weight so far.  Objective:  Vital Signs in the last 24 hours: Temp:  [98.7 F (37.1 C)-99.3 F (37.4 C)] 98.7 F (37.1 C) (05/28 0510) Pulse Rate:  [70-75] 75 (05/28 0510) Cardiac Rhythm:  [-] Normal sinus rhythm (05/28 0800) Resp:  [18-20] 18 (05/28 0510) BP: (132-152)/(62-68) 132/64 mmHg (05/28 0510) SpO2:  [99 %-100 %] 100 % (05/28 0510) Weight:  [66.724 kg (147 lb 1.6 oz)] 66.724 kg (147 lb 1.6 oz) (05/28 0510)  Physical Exam: BP Readings from Last 1 Encounters:  12/06/14 132/64    Wt Readings from Last 1 Encounters:  12/06/14 66.724 kg (147 lb 1.6 oz)    Weight change: -2.994 kg (-6 lb 9.6 oz)  HEENT: [place]/AT, Eyes-Brown, PERL, EOMI, Conjunctiva-Pink, Sclera-Non-icteric Neck: No JVD, No bruit, Trachea midline. Lungs:  Clear, Bilateral. Cardiac:  Regular rhythm, normal S1 and S2, no S3.  Abdomen:  Soft, non-tender. Extremities:  1 + presacral and bilateral lower extremities edema present. No cyanosis. No clubbing. CNS: AxOx3, Cranial nerves grossly intact, moves all 4 extremities. Right handed. Skin: Warm and dry.   Intake/Output from previous day: 05/27 0701 - 05/28 0700 In: 720 [P.O.:720] Out: 4550 [Urine:4550]    Lab Results: BMET    Component Value Date/Time   NA 135 12/05/2014 0616   NA 138 12/04/2014 0505   NA 136 12/02/2014 0945   K 4.3 12/05/2014 0616   K 5.8* 12/04/2014 0505   K 4.8 12/02/2014 0945   CL 103 12/05/2014 0616   CL 104 12/04/2014 0505   CL 103 12/02/2014 0945   CO2 23 12/05/2014 0616   CO2 23 12/04/2014 0505   CO2 22 12/02/2014 0945   GLUCOSE 108* 12/05/2014 0616   GLUCOSE 75 12/04/2014 0505   GLUCOSE 154* 12/02/2014 0945   BUN 43* 12/05/2014 0616   BUN 47* 12/04/2014 0505   BUN 60* 12/02/2014 0945   CREATININE 1.81* 12/05/2014 0616   CREATININE 1.82* 12/04/2014 0505   CREATININE 2.08* 12/02/2014 0945   CALCIUM 8.6* 12/05/2014 0616   CALCIUM  8.8* 12/04/2014 0505   CALCIUM 8.5* 12/02/2014 0945   GFRNONAA 29* 12/05/2014 0616   GFRNONAA 29* 12/04/2014 0505   GFRNONAA 24* 12/02/2014 0945   GFRAA 33* 12/05/2014 0616   GFRAA 33* 12/04/2014 0505   GFRAA 28* 12/02/2014 0945   CBC    Component Value Date/Time   WBC 4.1 12/04/2014 0505   RBC 5.30* 12/04/2014 0505   HGB 13.9 12/04/2014 0505   HCT 42.2 12/04/2014 0505   PLT PLATELET CLUMPS NOTED ON SMEAR, UNABLE TO ESTIMATE 12/04/2014 0505   MCV 79.6 12/04/2014 0505   MCH 26.2 12/04/2014 0505   MCHC 32.9 12/04/2014 0505   RDW 18.4* 12/04/2014 0505   LYMPHSABS 1.6 11/28/2014 1633   MONOABS 0.5 11/28/2014 1633   EOSABS 0.0 11/28/2014 1633   BASOSABS 0.0 11/28/2014 1633   HEPATIC Function Panel  Recent Labs  09/22/14 1202 11/28/14 1633 12/04/14 0505  PROT 6.9 7.2 6.4*   HEMOGLOBIN A1C No components found for: HGA1C,  MPG CARDIAC ENZYMES Lab Results  Component Value Date   CKTOTAL 59 09/07/2012   CKMB 3.2 09/07/2012   TROPONINI <0.03 11/29/2014   TROPONINI <0.03 11/28/2014   TROPONINI <0.03 11/28/2014   BNP  Recent Labs  04/02/14 0843 04/09/14 0451 06/19/14 1420  PROBNP 11219.0* 8206.0* 14027.0*   TSH  Recent Labs  02/03/14 1239  TSH 1.990  CHOLESTEROL No results for input(s): CHOL in the last 8760 hours.  Scheduled Meds: . allopurinol  100 mg Oral BID  . aspirin EC  81 mg Oral Daily  . carvedilol  3.125 mg Oral BID WC  . digoxin  0.0625 mg Oral Daily  . furosemide  40 mg Intravenous Q12H  . heparin  5,000 Units Subcutaneous 3 times per day  . insulin aspart  0-15 Units Subcutaneous TID WC  . isosorbide-hydrALAZINE  1 tablet Oral BID  . linagliptin  5 mg Oral Daily  . omeprazole  20 mg Oral Daily  . sodium chloride  3 mL Intravenous Q12H   Continuous Infusions:  PRN Meds:.sodium chloride, acetaminophen, nitroGLYCERIN, ondansetron (ZOFRAN) IV, sodium chloride  Assessment/Plan: Resolving Acute on chronic biventricular systolic heart  failure Coronary artery disease history of anteroseptal wall MI in the past Status post PCI to proximal LAD Hypertension Diabetes mellitus, II Acute kidney injury secondary to ACE-inhibitor and diuretic use Chronic kidney disease stage III Hypoalbuminemia Anxiety disorder Hyperkalemia-resolved  Continue medical treatment. Check labs in AM.     LOS: 8 days    Orpah CobbAjay Dorisann Schwanke  MD  12/06/2014, 10:42 AM

## 2014-12-07 LAB — CBC
HEMATOCRIT: 40.7 % (ref 36.0–46.0)
HEMOGLOBIN: 13.4 g/dL (ref 12.0–15.0)
MCH: 25.9 pg — ABNORMAL LOW (ref 26.0–34.0)
MCHC: 32.9 g/dL (ref 30.0–36.0)
MCV: 78.7 fL (ref 78.0–100.0)
Platelets: 208 10*3/uL (ref 150–400)
RBC: 5.17 MIL/uL — AB (ref 3.87–5.11)
RDW: 17.3 % — AB (ref 11.5–15.5)
WBC: 4.9 10*3/uL (ref 4.0–10.5)

## 2014-12-07 LAB — COMPREHENSIVE METABOLIC PANEL
ALBUMIN: 2.9 g/dL — AB (ref 3.5–5.0)
ALT: 13 U/L — AB (ref 14–54)
AST: 20 U/L (ref 15–41)
Alkaline Phosphatase: 83 U/L (ref 38–126)
Anion gap: 7 (ref 5–15)
BILIRUBIN TOTAL: 1.6 mg/dL — AB (ref 0.3–1.2)
BUN: 42 mg/dL — ABNORMAL HIGH (ref 6–20)
CALCIUM: 8.7 mg/dL — AB (ref 8.9–10.3)
CHLORIDE: 101 mmol/L (ref 101–111)
CO2: 26 mmol/L (ref 22–32)
Creatinine, Ser: 1.81 mg/dL — ABNORMAL HIGH (ref 0.44–1.00)
GFR calc non Af Amer: 29 mL/min — ABNORMAL LOW (ref >60)
GFR, EST AFRICAN AMERICAN: 33 mL/min — AB (ref >60)
Glucose, Bld: 84 mg/dL (ref 65–99)
POTASSIUM: 4.2 mmol/L (ref 3.5–5.1)
SODIUM: 134 mmol/L — AB (ref 135–145)
Total Protein: 6.9 g/dL (ref 6.5–8.1)

## 2014-12-07 LAB — GLUCOSE, CAPILLARY
GLUCOSE-CAPILLARY: 88 mg/dL (ref 65–99)
GLUCOSE-CAPILLARY: 138 mg/dL — AB (ref 65–99)
Glucose-Capillary: 88 mg/dL (ref 65–99)
Glucose-Capillary: 109 mg/dL — ABNORMAL HIGH (ref 65–99)

## 2014-12-07 LAB — BRAIN NATRIURETIC PEPTIDE: B Natriuretic Peptide: 582.6 pg/mL — ABNORMAL HIGH (ref 0.0–100.0)

## 2014-12-07 MED ORDER — FUROSEMIDE 40 MG PO TABS
40.0000 mg | ORAL_TABLET | Freq: Every day | ORAL | Status: DC
  Administered 2014-12-08: 40 mg via ORAL
  Filled 2014-12-07: qty 1

## 2014-12-07 NOTE — Progress Notes (Signed)
Ref: [name],[name], MD   Subjective:  Breathing and ambulating better. Lost 20 + pounds of weight.  Objective:  Vital Signs in the last 24 hours: Temp:  [98.1 F (36.7 C)-98.7 F (37.1 C)] 98.1 F (36.7 C) (05/29 1432) Pulse Rate:  [71-76] 71 (05/29 1432) Cardiac Rhythm:  [-] Normal sinus rhythm (05/29 0906) Resp:  [20] 20 (05/29 0500) BP: (112-150)/(47-71) 112/47 mmHg (05/29 1432) SpO2:  [97 %-100 %] 97 % (05/29 1432) Weight:  [65.862 kg (145 lb 3.2 oz)] 65.862 kg (145 lb 3.2 oz) (05/29 0500)  Physical Exam: BP Readings from Last 1 Encounters:  12/07/14 112/47    Wt Readings from Last 1 Encounters:  12/07/14 65.862 kg (145 lb 3.2 oz)    Weight change: -0.862 kg (-1 lb 14.4 oz)  HEENT: [place]/AT, Eyes-Brown, PERL, EOMI, Conjunctiva-Pink, Sclera-Non-icteric Neck: No JVD, No bruit, Trachea midline. Lungs:  Clear, Bilateral. Cardiac:  Regular rhythm, normal S1 and S2, no S3.  Abdomen:  Soft, non-tender. Extremities:  1 + presacral and trace lower leg edema present. No cyanosis. No clubbing. CNS: AxOx3, Cranial nerves grossly intact, moves all 4 extremities. Right handed. Skin: Warm and dry.   Intake/Output from previous day: 05/28 0701 - 05/29 0700 In: 1140 [P.O.:1140] Out: 2250 [Urine:2250]    Lab Results: BMET    Component Value Date/Time   NA 134* 12/07/2014 0530   NA 135 12/05/2014 0616   NA 138 12/04/2014 0505   K 4.2 12/07/2014 0530   K 4.3 12/05/2014 0616   K 5.8* 12/04/2014 0505   CL 101 12/07/2014 0530   CL 103 12/05/2014 0616   CL 104 12/04/2014 0505   CO2 26 12/07/2014 0530   CO2 23 12/05/2014 0616   CO2 23 12/04/2014 0505   GLUCOSE 84 12/07/2014 0530   GLUCOSE 108* 12/05/2014 0616   GLUCOSE 75 12/04/2014 0505   BUN 42* 12/07/2014 0530   BUN 43* 12/05/2014 0616   BUN 47* 12/04/2014 0505   CREATININE 1.81* 12/07/2014 0530   CREATININE 1.81* 12/05/2014 0616   CREATININE 1.82* 12/04/2014 0505   CALCIUM 8.7* 12/07/2014 0530   CALCIUM 8.6*  12/05/2014 0616   CALCIUM 8.8* 12/04/2014 0505   GFRNONAA 29* 12/07/2014 0530   GFRNONAA 29* 12/05/2014 0616   GFRNONAA 29* 12/04/2014 0505   GFRAA 33* 12/07/2014 0530   GFRAA 33* 12/05/2014 0616   GFRAA 33* 12/04/2014 0505   CBC    Component Value Date/Time   WBC 4.9 12/07/2014 0530   RBC 5.17* 12/07/2014 0530   HGB 13.4 12/07/2014 0530   HCT 40.7 12/07/2014 0530   PLT 208 12/07/2014 0530   MCV 78.7 12/07/2014 0530   MCH 25.9* 12/07/2014 0530   MCHC 32.9 12/07/2014 0530   RDW 17.3* 12/07/2014 0530   LYMPHSABS 1.6 11/28/2014 1633   MONOABS 0.5 11/28/2014 1633   EOSABS 0.0 11/28/2014 1633   BASOSABS 0.0 11/28/2014 1633   HEPATIC Function Panel  Recent Labs  11/28/14 1633 12/04/14 0505 12/07/14 0530  PROT 7.2 6.4* 6.9   HEMOGLOBIN A1C No components found for: HGA1C,  MPG CARDIAC ENZYMES Lab Results  Component Value Date   CKTOTAL 59 09/07/2012   CKMB 3.2 09/07/2012   TROPONINI <0.03 11/29/2014   TROPONINI <0.03 11/28/2014   TROPONINI <0.03 11/28/2014   BNP  Recent Labs  04/02/14 0843 04/09/14 0451 06/19/14 1420  PROBNP 11219.0* 8206.0* 14027.0*   TSH  Recent Labs  02/03/14 1239  TSH 1.990   CHOLESTEROL No results for input(s): CHOL  in the last 8760 hours.  Scheduled Meds: . allopurinol  100 mg Oral BID  . aspirin EC  81 mg Oral Daily  . carvedilol  3.125 mg Oral BID WC  . digoxin  0.0625 mg Oral Daily  . [START ON 12/08/2014] furosemide  40 mg Oral Daily  . heparin  5,000 Units Subcutaneous 3 times per day  . insulin aspart  0-15 Units Subcutaneous TID WC  . isosorbide-hydrALAZINE  1 tablet Oral BID  . linagliptin  5 mg Oral Daily  . omeprazole  20 mg Oral Daily  . sodium chloride  3 mL Intravenous Q12H   Continuous Infusions:  PRN Meds:.sodium chloride, acetaminophen, nitroGLYCERIN, ondansetron (ZOFRAN) IV, sodium chloride  Assessment/Plan: Resolving Acute on chronic biventricular systolic heart failure Coronary artery disease history  of anteroseptal wall MI in the past Status post PCI to proximal LAD Hypertension Diabetes mellitus, II Acute kidney injury secondary to ACE-inhibitor and diuretic use Chronic kidney disease stage III Hypoalbuminemia Anxiety disorder Hyperkalemia-resolved  Continue medications and activity. Home in AM if stable.     LOS: 9 days    Tanya Cobb  MD  12/07/2014, 5:45 PM

## 2014-12-08 LAB — GLUCOSE, CAPILLARY
GLUCOSE-CAPILLARY: 139 mg/dL — AB (ref 65–99)
Glucose-Capillary: 106 mg/dL — ABNORMAL HIGH (ref 65–99)

## 2014-12-08 MED ORDER — ALLOPURINOL 100 MG PO TABS: 100.0000 mg | ORAL_TABLET | Freq: Every day | ORAL | Status: DC

## 2014-12-08 MED ORDER — FUROSEMIDE 80 MG PO TABS: 40.0000 mg | ORAL_TABLET | Freq: Two times a day (BID) | ORAL | Status: DC

## 2014-12-08 MED ORDER — OMEPRAZOLE 20 MG PO CPDR: 20.0000 mg | DELAYED_RELEASE_CAPSULE | Freq: Every day | ORAL | Status: DC

## 2014-12-08 MED ORDER — AMLODIPINE BESYLATE 5 MG PO TABS: 5.0000 mg | ORAL_TABLET | Freq: Every day | ORAL | Status: DC

## 2014-12-08 MED ORDER — ISOSORB DINITRATE-HYDRALAZINE 20-37.5 MG PO TABS: 1.0000 | ORAL_TABLET | Freq: Two times a day (BID) | ORAL | Status: DC

## 2014-12-08 NOTE — Discharge Summary (Signed)
Physician Discharge Summary  Patient ID: [name] MRN: [id number] DOB/AGE: April 15, 1951 64 y.o.  Admit date: 11/28/2014 Discharge date: 12/08/2014  Admission Diagnoses: Acute on chronic biventricular systolic heart failure Coronary artery disease history of anteroseptal wall MI in the past Status post PCI to proximal LAD Hypertension Diabetes mellitus, II Acute kidney injury secondary to ACE-inhibitor and diuretic use Chronic kidney disease stage III Hypoalbuminemia Anxiety disorder  Discharge Diagnoses:  Principal Problem: Acute Biventricular CHF (congestive heart failure) Active Problems:   Diabetes mellitus, II   Essential hypertension   Coronary artery disease history of anteroseptal wall MI in the past   Status post PCI to proximal LAD    Acute kidney injury secondary to ACE-inhibitor and diuretic use   Chronic kidney disease stage III   Hypoalbuminemia   Anxiety disorder   Hyperkalemia-resolved  Discharged Condition: fair  Hospital Course: 64 year old female presents with leg edema and progressive worsening of shortness of breath and 10-15 pound weight gain x 2-3 weeks has past medical history of acute biventricular systolic heart failure, CAD, status post stent in LAD, diabetes mellitus type 2, hypertension and anxiety. No fever or chest pain. Has some medications and dietary non-compliance off and on. She had slow and steady diursis with 20 + pounds of weight loss over 10 days. She understood dietary and medication compliance + fluid restriction and daily weight. She will be followed by me in 1 week.  Consults: cardiology  Significant Diagnostic Studies: labs: BNP-3161.5 on admission and 582.6 on 12/07/2014. CBC normal, BMET near normal except BUN/Cr of 55/1.96  Chest x-ray: 1. Decreased left pleural fluid and atelectasis. 2. Stable cardiomegaly and chronic bronchitic changes.  EKG-NSR, Left atrial enlargement and poor r wave progression.  Treatments: cardiac  meds: carvedilol, amlodipine, digoxin and furosemide  Discharge Exam: Blood pressure 156/82, pulse 75, temperature 98.4 F (36.9 C), temperature source Oral, resp. rate 18, height  (1.626 m), weight 65 kg (143 lb 4.8 oz), SpO2 100 %. HEENT: [place]/AT, Eyes-Brown, PERL, EOMI, Conjunctiva-Pink, Sclera-Non-icteric Neck: No JVD, No bruit, Trachea midline. Lungs: Clear, Bilateral. Cardiac: Regular rhythm, normal S1 and S2, no S3.  Abdomen: Soft, non-tender. Extremities: Trace + presacral and trace lower leg edema present. No cyanosis. No clubbing. CNS: AxOx3, Cranial nerves grossly intact, moves all 4 extremities. Right handed. Skin: Warm and dry.  Disposition: 01-Home or Self Care     Medication List    STOP taking these medications        potassium chloride 10 MEQ tablet  Commonly known as:  K-DUR     ramipril 2.5 MG capsule  Commonly known as:  ALTACE      TAKE these medications        acetaminophen 500 MG tablet  Commonly known as:  TYLENOL  Take 500 mg by mouth every 6 (six) hours as needed (pain).     allopurinol 100 MG tablet  Commonly known as:  ZYLOPRIM  Take 1 tablet (100 mg total) by mouth daily.     amLODipine 5 MG tablet  Commonly known as:  NORVASC  Take 1 tablet (5 mg total) by mouth daily.     aspirin 81 MG EC tablet  Take 1 tablet (81 mg total) by mouth daily.     carvedilol 3.125 MG tablet  Commonly known as:  COREG  Take 1 tablet (3.125 mg total) by mouth 2 (two) times daily with a meal.     digoxin 0.125 MG tablet  Commonly known as:  LANOXIN  Take 0.5 tablets (0.0625 mg total) by mouth daily.     furosemide 80 MG tablet  Commonly known as:  LASIX  Take 0.5 tablets (40 mg total) by mouth 2 (two) times daily.     isosorbide-hydrALAZINE 20-37.5 MG per tablet  Commonly known as:  BIDIL  Take 1 tablet by mouth 2 (two) times daily.     linagliptin 5 MG Tabs tablet  Commonly known as:  TRADJENTA  Take 1 tablet (5 mg total) by mouth daily.      nitroGLYCERIN 0.4 MG SL tablet  Commonly known as:  NITROSTAT  Place 0.4 mg under the tongue every 5 (five) minutes as needed for chest pain.     omeprazole 20 MG capsule  Commonly known as:  PRILOSEC  Take 1 capsule (20 mg total) by mouth daily.     spironolactone 25 MG tablet  Commonly known as:  ALDACTONE  Take 25 mg by mouth at bedtime.           Follow-up Information    [facility]. Schedule an appointment as soon as possible for a visit in 1 week.   Specialty:  Cardiology   Contact information:   [address] [place] [address] [phone]       Signed: [name],Charleen Madera S 12/08/2014, 9:48 AM

## 2014-12-08 NOTE — Progress Notes (Signed)
Orders received for pt discharge.  Discharge summary printed and reviewed with pt.  Explained medication regimen, and pt had no further questions at this time.  IV removed and site remains clean, dry, intact.  Telemetry removed.  Pt in stable condition and awaiting transport. 

## 2015-01-31 ENCOUNTER — Encounter (HOSPITAL_COMMUNITY): Payer: Self-pay | Admitting: Emergency Medicine

## 2015-01-31 ENCOUNTER — Emergency Department (HOSPITAL_COMMUNITY): Payer: 59

## 2015-01-31 DIAGNOSIS — I5023 Acute on chronic systolic (congestive) heart failure: Secondary | ICD-10-CM | POA: Diagnosis not present

## 2015-01-31 DIAGNOSIS — Z79899 Other long term (current) drug therapy: Secondary | ICD-10-CM

## 2015-01-31 DIAGNOSIS — M1000 Idiopathic gout, unspecified site: Secondary | ICD-10-CM | POA: Diagnosis present

## 2015-01-31 DIAGNOSIS — I252 Old myocardial infarction: Secondary | ICD-10-CM

## 2015-01-31 DIAGNOSIS — E1122 Type 2 diabetes mellitus with diabetic chronic kidney disease: Secondary | ICD-10-CM | POA: Diagnosis present

## 2015-01-31 DIAGNOSIS — N183 Chronic kidney disease, stage 3 (moderate): Secondary | ICD-10-CM | POA: Diagnosis present

## 2015-01-31 DIAGNOSIS — Z7982 Long term (current) use of aspirin: Secondary | ICD-10-CM

## 2015-01-31 DIAGNOSIS — I129 Hypertensive chronic kidney disease with stage 1 through stage 4 chronic kidney disease, or unspecified chronic kidney disease: Secondary | ICD-10-CM | POA: Diagnosis present

## 2015-01-31 DIAGNOSIS — I2510 Atherosclerotic heart disease of native coronary artery without angina pectoris: Secondary | ICD-10-CM | POA: Diagnosis present

## 2015-01-31 DIAGNOSIS — E780 Pure hypercholesterolemia: Secondary | ICD-10-CM | POA: Diagnosis present

## 2015-01-31 DIAGNOSIS — R0602 Shortness of breath: Secondary | ICD-10-CM | POA: Diagnosis not present

## 2015-01-31 DIAGNOSIS — Z87891 Personal history of nicotine dependence: Secondary | ICD-10-CM

## 2015-01-31 DIAGNOSIS — F419 Anxiety disorder, unspecified: Secondary | ICD-10-CM | POA: Diagnosis present

## 2015-01-31 DIAGNOSIS — K219 Gastro-esophageal reflux disease without esophagitis: Secondary | ICD-10-CM | POA: Diagnosis present

## 2015-01-31 NOTE — ED Notes (Signed)
Patient here with complaint of fluid retention and shortness of breath starting last week. Reports history of CHF with hospitalization. States legs have been swelling and breathing has become more labored. Difficultly laying flat reported. Lung sounds diminished.

## 2015-02-01 ENCOUNTER — Encounter (HOSPITAL_COMMUNITY): Payer: Self-pay

## 2015-02-01 ENCOUNTER — Inpatient Hospital Stay (HOSPITAL_COMMUNITY)
Admission: EM | Admit: 2015-02-01 | Discharge: 2015-02-06 | DRG: 293 | Disposition: A | Discharge status: Home / Self Care | Payer: 59 | Attending: Cardiovascular Disease | Admitting: Cardiovascular Disease

## 2015-02-01 DIAGNOSIS — N183 Chronic kidney disease, stage 3 (moderate): Secondary | ICD-10-CM | POA: Diagnosis present

## 2015-02-01 DIAGNOSIS — E780 Pure hypercholesterolemia: Secondary | ICD-10-CM | POA: Diagnosis present

## 2015-02-01 DIAGNOSIS — Z87891 Personal history of nicotine dependence: Secondary | ICD-10-CM | POA: Diagnosis not present

## 2015-02-01 DIAGNOSIS — I252 Old myocardial infarction: Secondary | ICD-10-CM | POA: Diagnosis not present

## 2015-02-01 DIAGNOSIS — I509 Heart failure, unspecified: Secondary | ICD-10-CM

## 2015-02-01 DIAGNOSIS — F419 Anxiety disorder, unspecified: Secondary | ICD-10-CM | POA: Diagnosis present

## 2015-02-01 DIAGNOSIS — Z79899 Other long term (current) drug therapy: Secondary | ICD-10-CM | POA: Diagnosis not present

## 2015-02-01 DIAGNOSIS — I5023 Acute on chronic systolic (congestive) heart failure: Secondary | ICD-10-CM | POA: Diagnosis present

## 2015-02-01 DIAGNOSIS — E1122 Type 2 diabetes mellitus with diabetic chronic kidney disease: Secondary | ICD-10-CM | POA: Diagnosis present

## 2015-02-01 DIAGNOSIS — K219 Gastro-esophageal reflux disease without esophagitis: Secondary | ICD-10-CM | POA: Diagnosis present

## 2015-02-01 DIAGNOSIS — I2510 Atherosclerotic heart disease of native coronary artery without angina pectoris: Secondary | ICD-10-CM | POA: Diagnosis present

## 2015-02-01 DIAGNOSIS — R0602 Shortness of breath: Secondary | ICD-10-CM | POA: Diagnosis present

## 2015-02-01 DIAGNOSIS — Z7982 Long term (current) use of aspirin: Secondary | ICD-10-CM | POA: Diagnosis not present

## 2015-02-01 DIAGNOSIS — I129 Hypertensive chronic kidney disease with stage 1 through stage 4 chronic kidney disease, or unspecified chronic kidney disease: Secondary | ICD-10-CM | POA: Diagnosis present

## 2015-02-01 DIAGNOSIS — M1000 Idiopathic gout, unspecified site: Secondary | ICD-10-CM | POA: Diagnosis present

## 2015-02-01 LAB — BASIC METABOLIC PANEL
Anion gap: 11 (ref 5–15)
BUN: 59 mg/dL — AB (ref 6–20)
CHLORIDE: 99 mmol/L — AB (ref 101–111)
CO2: 24 mmol/L (ref 22–32)
Calcium: 8.8 mg/dL — ABNORMAL LOW (ref 8.9–10.3)
Creatinine, Ser: 1.69 mg/dL — ABNORMAL HIGH (ref 0.44–1.00)
GFR calc non Af Amer: 31 mL/min — ABNORMAL LOW (ref >60)
GFR, EST AFRICAN AMERICAN: 36 mL/min — AB (ref >60)
GLUCOSE: 136 mg/dL — AB (ref 65–99)
POTASSIUM: 3.9 mmol/L (ref 3.5–5.1)
Sodium: 134 mmol/L — ABNORMAL LOW (ref 135–145)

## 2015-02-01 LAB — CBC WITH DIFFERENTIAL/PLATELET
BASOS PCT: 1 % (ref 0–1)
Basophils Absolute: 0 10*3/uL (ref 0.0–0.1)
EOS ABS: 0.1 10*3/uL (ref 0.0–0.7)
EOS PCT: 1 % (ref 0–5)
HCT: 43.8 % (ref 36.0–46.0)
Hemoglobin: 14.3 g/dL (ref 12.0–15.0)
Lymphocytes Relative: 28 % (ref 12–46)
Lymphs Abs: 1.1 10*3/uL (ref 0.7–4.0)
MCH: 26.2 pg (ref 26.0–34.0)
MCHC: 32.6 g/dL (ref 30.0–36.0)
MCV: 80.2 fL (ref 78.0–100.0)
Monocytes Absolute: 0.4 10*3/uL (ref 0.1–1.0)
Monocytes Relative: 11 % (ref 3–12)
Neutro Abs: 2.3 10*3/uL (ref 1.7–7.7)
Neutrophils Relative %: 59 % (ref 43–77)
Platelets: 227 10*3/uL (ref 150–400)
RBC: 5.46 MIL/uL — ABNORMAL HIGH (ref 3.87–5.11)
RDW: 17.9 % — AB (ref 11.5–15.5)
WBC: 4 10*3/uL (ref 4.0–10.5)

## 2015-02-01 LAB — COMPREHENSIVE METABOLIC PANEL
ALT: 12 U/L — AB (ref 14–54)
ANION GAP: 10 (ref 5–15)
AST: 24 U/L (ref 15–41)
Albumin: 2.9 g/dL — ABNORMAL LOW (ref 3.5–5.0)
Alkaline Phosphatase: 81 U/L (ref 38–126)
BUN: 57 mg/dL — AB (ref 6–20)
CO2: 24 mmol/L (ref 22–32)
Calcium: 9.1 mg/dL (ref 8.9–10.3)
Chloride: 104 mmol/L (ref 101–111)
Creatinine, Ser: 1.71 mg/dL — ABNORMAL HIGH (ref 0.44–1.00)
GFR, EST AFRICAN AMERICAN: 36 mL/min — AB (ref >60)
GFR, EST NON AFRICAN AMERICAN: 31 mL/min — AB (ref >60)
GLUCOSE: 211 mg/dL — AB (ref 65–99)
Potassium: 3.5 mmol/L (ref 3.5–5.1)
Sodium: 138 mmol/L (ref 135–145)
TOTAL PROTEIN: 6.6 g/dL (ref 6.5–8.1)
Total Bilirubin: 2.5 mg/dL — ABNORMAL HIGH (ref 0.3–1.2)

## 2015-02-01 LAB — BRAIN NATRIURETIC PEPTIDE: B NATRIURETIC PEPTIDE 5: 2513.7 pg/mL — AB (ref 0.0–100.0)

## 2015-02-01 LAB — GLUCOSE, CAPILLARY
Glucose-Capillary: 127 mg/dL — ABNORMAL HIGH (ref 65–99)
Glucose-Capillary: 167 mg/dL — ABNORMAL HIGH (ref 65–99)
Glucose-Capillary: 130 mg/dL — ABNORMAL HIGH (ref 65–99)
Glucose-Capillary: 125 mg/dL — ABNORMAL HIGH (ref 65–99)

## 2015-02-01 LAB — CBC
HCT: 44 % (ref 36.0–46.0)
HEMOGLOBIN: 14.7 g/dL (ref 12.0–15.0)
MCH: 26.6 pg (ref 26.0–34.0)
MCHC: 33.4 g/dL (ref 30.0–36.0)
MCV: 79.6 fL (ref 78.0–100.0)
Platelets: 217 10*3/uL (ref 150–400)
RBC: 5.53 MIL/uL — ABNORMAL HIGH (ref 3.87–5.11)
RDW: 17.8 % — ABNORMAL HIGH (ref 11.5–15.5)
WBC: 4.5 10*3/uL (ref 4.0–10.5)

## 2015-02-01 LAB — DIGOXIN LEVEL

## 2015-02-01 LAB — I-STAT TROPONIN, ED: Troponin i, poc: 0 ng/mL (ref 0.00–0.08)

## 2015-02-01 LAB — TSH: TSH: 2.961 u[IU]/mL (ref 0.350–4.500)

## 2015-02-01 MED ORDER — SODIUM CHLORIDE 0.9 % IJ SOLN
3.0000 mL | Freq: Two times a day (BID) | INTRAMUSCULAR | Status: DC
  Administered 2015-02-01 – 2015-02-06 (×11): 3 mL via INTRAVENOUS

## 2015-02-01 MED ORDER — ASPIRIN 81 MG PO CHEW
324.0000 mg | CHEWABLE_TABLET | Freq: Once | ORAL | Status: AC
  Administered 2015-02-01: 324 mg via ORAL
  Filled 2015-02-01: qty 4

## 2015-02-01 MED ORDER — POTASSIUM CHLORIDE CRYS ER 10 MEQ PO TBCR
10.0000 meq | EXTENDED_RELEASE_TABLET | Freq: Two times a day (BID) | ORAL | Status: DC
  Administered 2015-02-01 (×2): 10 meq via ORAL
  Filled 2015-02-01 (×4): qty 1

## 2015-02-01 MED ORDER — HEPARIN SODIUM (PORCINE) 5000 UNIT/ML IJ SOLN
5000.0000 [IU] | Freq: Three times a day (TID) | INTRAMUSCULAR | Status: DC
  Administered 2015-02-01 – 2015-02-06 (×12): 5000 [IU] via SUBCUTANEOUS
  Filled 2015-02-01 (×19): qty 1

## 2015-02-01 MED ORDER — ALLOPURINOL 100 MG PO TABS
100.0000 mg | ORAL_TABLET | Freq: Every day | ORAL | Status: DC
  Administered 2015-02-01 – 2015-02-06 (×6): 100 mg via ORAL
  Filled 2015-02-01 (×6): qty 1

## 2015-02-01 MED ORDER — INSULIN ASPART 100 UNIT/ML ~~LOC~~ SOLN
0.0000 [IU] | Freq: Three times a day (TID) | SUBCUTANEOUS | Status: DC
  Administered 2015-02-01: 2 [IU] via SUBCUTANEOUS
  Administered 2015-02-01 – 2015-02-02 (×3): 1 [IU] via SUBCUTANEOUS
  Administered 2015-02-02: 2 [IU] via SUBCUTANEOUS
  Administered 2015-02-03 (×3): 1 [IU] via SUBCUTANEOUS
  Administered 2015-02-04: 2 [IU] via SUBCUTANEOUS
  Administered 2015-02-04: 1 [IU] via SUBCUTANEOUS
  Administered 2015-02-04: 2 [IU] via SUBCUTANEOUS
  Administered 2015-02-05 (×2): 1 [IU] via SUBCUTANEOUS
  Administered 2015-02-06: 2 [IU] via SUBCUTANEOUS

## 2015-02-01 MED ORDER — FUROSEMIDE 10 MG/ML IJ SOLN
40.0000 mg | Freq: Two times a day (BID) | INTRAMUSCULAR | Status: DC
  Administered 2015-02-01 – 2015-02-06 (×10): 40 mg via INTRAVENOUS
  Filled 2015-02-01 (×14): qty 4

## 2015-02-01 MED ORDER — CARVEDILOL 3.125 MG PO TABS
3.1250 mg | ORAL_TABLET | Freq: Two times a day (BID) | ORAL | Status: DC
  Administered 2015-02-01: 3.125 mg via ORAL
  Filled 2015-02-01 (×2): qty 1

## 2015-02-01 MED ORDER — ASPIRIN EC 81 MG PO TBEC
81.0000 mg | DELAYED_RELEASE_TABLET | Freq: Every day | ORAL | Status: DC
  Administered 2015-02-01 – 2015-02-06 (×6): 81 mg via ORAL
  Filled 2015-02-01 (×6): qty 1

## 2015-02-01 MED ORDER — ACETAMINOPHEN 325 MG PO TABS: 650.0000 mg | ORAL_TABLET | ORAL | Status: DC | PRN

## 2015-02-01 MED ORDER — SPIRONOLACTONE 25 MG PO TABS
25.0000 mg | ORAL_TABLET | Freq: Every day | ORAL | Status: DC
  Administered 2015-02-01: 25 mg via ORAL
  Filled 2015-02-01 (×2): qty 1

## 2015-02-01 MED ORDER — ASPIRIN EC 81 MG PO TBEC: 81.0000 mg | DELAYED_RELEASE_TABLET | Freq: Every day | ORAL | Status: DC

## 2015-02-01 MED ORDER — FUROSEMIDE 10 MG/ML IJ SOLN
40.0000 mg | Freq: Once | INTRAMUSCULAR | Status: AC
  Administered 2015-02-01: 40 mg via INTRAVENOUS
  Filled 2015-02-01: qty 4

## 2015-02-01 MED ORDER — NITROGLYCERIN 2 % TD OINT
0.5000 [in_us] | TOPICAL_OINTMENT | Freq: Once | TRANSDERMAL | Status: AC
  Administered 2015-02-01: 0.5 [in_us] via TOPICAL
  Filled 2015-02-01: qty 1

## 2015-02-01 MED ORDER — ONDANSETRON HCL 4 MG/2ML IJ SOLN: 4.0000 mg | Freq: Four times a day (QID) | INTRAMUSCULAR | Status: DC | PRN

## 2015-02-01 MED ORDER — NITROGLYCERIN 0.4 MG SL SUBL: 0.4000 mg | SUBLINGUAL_TABLET | SUBLINGUAL | Status: DC | PRN

## 2015-02-01 MED ORDER — METOLAZONE 5 MG PO TABS
5.0000 mg | ORAL_TABLET | Freq: Every day | ORAL | Status: DC
  Administered 2015-02-01 – 2015-02-06 (×6): 5 mg via ORAL
  Filled 2015-02-01 (×7): qty 1

## 2015-02-01 MED ORDER — ISOSORB DINITRATE-HYDRALAZINE 20-37.5 MG PO TABS
1.0000 | ORAL_TABLET | Freq: Two times a day (BID) | ORAL | Status: DC
  Administered 2015-02-01 – 2015-02-06 (×11): 1 via ORAL
  Filled 2015-02-01 (×12): qty 1

## 2015-02-01 MED ORDER — SODIUM CHLORIDE 0.9 % IV SOLN: 250.0000 mL | INTRAVENOUS | Status: DC | PRN

## 2015-02-01 MED ORDER — SODIUM CHLORIDE 0.9 % IJ SOLN: 3.0000 mL | INTRAMUSCULAR | Status: DC | PRN

## 2015-02-01 MED ORDER — PANTOPRAZOLE SODIUM 40 MG PO TBEC
40.0000 mg | DELAYED_RELEASE_TABLET | Freq: Every day | ORAL | Status: DC
  Administered 2015-02-01 – 2015-02-06 (×6): 40 mg via ORAL
  Filled 2015-02-01 (×6): qty 1

## 2015-02-01 NOTE — ED Provider Notes (Signed)
This chart was scribed for  Tanya Maw Charity Tessier, DO by [name], ED Scribe. This patient was seen in room A08C/A08C and the patient's care was started at 2:30 AM.  TIME SEEN: 2:30 AM  CHIEF COMPLAINT: SOB  HPI: [name] is a 64 y.o. female with PMHx of CHF, CAD, HTN, DM, and high cholesterol who presents to the Emergency Department complaining of constant SOB with onset 2 weeks ago. The SOB is worse with exertion and laying flat.She normally sleeps with 3 pillows.  Associated symptoms include LE swelling and dry cough. Pt denies fever and chest pain. She has not missed any doses of Lasix. Dry weight "143 or 153" lbs. Her last weight PTA  was "1 hundred sixty something". She spoke with [name] in her PCPs office and was advised to come to the ED.   PCP: [name]  ROS: See HPI Constitutional: no fever  Eyes: no drainage  ENT: no runny nose   Cardiovascular:  no chest pain  Resp: SOB  GI: no vomiting GU: no dysuria Integumentary: no rash  Allergy: no hives  Musculoskeletal: leg swelling  Neurological: no slurred speech ROS otherwise negative  PAST MEDICAL HISTORY/PAST SURGICAL HISTORY:  Past Medical History  Diagnosis Date  . Hypertension   . Coronary artery disease   . CHF (congestive heart failure)   . Shortness of breath dyspnea   . High cholesterol   . Type II diabetes mellitus   . GERD (gastroesophageal reflux disease)   . Gout     MEDICATIONS:  Prior to Admission medications   Medication Sig Start Date End Date Taking? Authorizing Provider  acetaminophen (TYLENOL) 500 MG tablet Take 500 mg by mouth every 6 (six) hours as needed (pain).     Historical Provider, MD  allopurinol (ZYLOPRIM) 100 MG tablet Take 1 tablet (100 mg total) by mouth daily. 12/08/14   [name], MD  amLODipine (NORVASC) 5 MG tablet Take 1 tablet (5 mg total) by mouth daily. 12/08/14   [name], MD  aspirin EC 81 MG EC tablet Take 1 tablet (81 mg total) by mouth daily. 02/05/14    [name], MD  carvedilol (COREG) 3.125 MG tablet Take 1 tablet (3.125 mg total) by mouth 2 (two) times daily with a meal. 02/05/14   [name], MD  digoxin (LANOXIN) 0.125 MG tablet Take 0.5 tablets (0.0625 mg total) by mouth daily. 04/09/14   [name], MD  furosemide (LASIX) 80 MG tablet Take 0.5 tablets (40 mg total) by mouth 2 (two) times daily. 12/08/14   [name], MD  isosorbide-hydrALAZINE (BIDIL) 20-37.5 MG per tablet Take 1 tablet by mouth 2 (two) times daily. 12/08/14   [name], MD  linagliptin (TRADJENTA) 5 MG TABS tablet Take 1 tablet (5 mg total) by mouth daily. 09/29/14   [name], MD  nitroGLYCERIN (NITROSTAT) 0.4 MG SL tablet Place 0.4 mg under the tongue every 5 (five) minutes as needed for chest pain. 09/10/12   [name], MD  omeprazole (PRILOSEC) 20 MG capsule Take 1 capsule (20 mg total) by mouth daily. 12/08/14   [name], MD  spironolactone (ALDACTONE) 25 MG tablet Take 25 mg by mouth at bedtime. 07/25/13   [name], MD    ALLERGIES:  No Known Allergies  SOCIAL HISTORY:  History  Substance Use Topics  . Smoking status: Former Smoker -- 0.50 packs/day for 20 years    Types: Cigarettes  . Smokeless tobacco: Never Used  Comment: quit smoking in the 80's  . Alcohol Use: No    FAMILY HISTORY: History reviewed. No pertinent family history.  EXAM: BP 176/106 mmHg  Pulse 95  Temp(Src) 98.4 F (36.9 C) (Oral)  Resp 16  SpO2 100% CONSTITUTIONAL: Alert and oriented and responds appropriately to questions. Appears SOB; well-nourished, afebrile and non-toxic, chronically ill-appearing HEAD: Normocephalic EYES: Conjunctivae clear, PERRL ENT: normal nose; no rhinorrhea; moist mucous membranes; pharynx without lesions noted NECK: Supple, no meningismus, no LAD, JVD to the level of the mandible  CARD: RRR; S1 and S2 appreciated; no murmurs, no clicks, no rubs, no gallops RESP: pt is mild respiratory distress, tachypnic, speaking in short  sentences, bibasilar crackles without rhonchi or wheezing, no hypoxia ABD/GI: Normal bowel sounds; non-distended; soft, non-tender, no rebound, no guarding, no peritoneal signs BACK:  The back appears normal and is non-tender to palpation, there is no CVA tenderness EXT: Normal ROM in all joints; non-tender to palpation; patient is +1 pitting edema to her bilateral lower extremities to the mid distal lower extremity; normal capillary refill; no cyanosis, no calf tenderness or swelling    SKIN: Normal color for age and race; warm NEURO: Moves all extremities equally, sensation to light touch intact diffusely, cranial nerves II through XII intact PSYCH: The patient's mood and manner are appropriate. Grooming and personal hygiene are appropriate.  MEDICAL DECISION MAKING: Patient here CHF exacerbation. She is tachypneic but not hypoxic. His also mildly hypertensive. We'll give IV Lasix 40 mg, aspirin and Nitropaste. Chest x-ray shows pulmonary edema. BNP is elevated at 2500. Troponin negative. She will need admission.  ED PROGRESS:   2:55 AM-Consult complete with Hawarni for [name]. Patient case explained and discussed. Dr. Patria Mane agrees to admit patient for further evaluation and treatment. I will place holding orders. He will see the patient in the morning.   EKG Interpretation  Date/Time:  Saturday January 31 2015 22:15:34 EDT Ventricular Rate:  95 PR Interval:  166 QRS Duration: 92 QT Interval:  398 QTC Calculation: 500 R Axis:   153 Text Interpretation:  Sinus rhythm with Fusion complexes and Premature atrial complexes with Abberant conduction Left atrial enlargement Right axis deviation Cannot rule out Anterior infarct , age undetermined Abnormal ECG No significant change since last tracing Confirmed by [name],  DO, Shatiqua Heroux ([id number]) on 02/01/2015 2:08:14 AM         I personally performed the services described in this documentation, which was scribed in my presence. The recorded  information has been reviewed and is accurate.      Tanya Maw [name], DO 02/01/15 [phone]

## 2015-02-01 NOTE — ED Notes (Signed)
IV team at bedside 

## 2015-02-01 NOTE — H&P (Signed)
[name] is an 64 y.o. female.   Chief Complaint: Progressive increasing shortness of breath associated and leg swelling HPI: Patient is 64 year old female with past medical history significant for coronary artery disease history of anteroseptal wall MI in the past, history of recurrent congestive heart failure secondary to depressed LV systolic function, hypertension, and diabetes mellitus, GERD, hypercholesteremia, history of gouty arthritis, chronic kidney disease stage III, anxiety disorder, came to the ER complaining of progressive increasing shortness of breath associated with leg swelling for last 2 weeks. Patient also gives history of PND orthopnea. Denies palpitation lightheadedness or syncope. Denies any chest pain. States saw PMD last Monday and her diabetic dose was increased without much improvement. Patient states she has been eating salty food and drinking plenty of fluid lately. States she has gained approximately 20 pounds in last 3 weeks.  Past Medical History  Diagnosis Date  . Hypertension   . Coronary artery disease   . CHF (congestive heart failure)   . Shortness of breath dyspnea   . High cholesterol   . Type II diabetes mellitus   . GERD (gastroesophageal reflux disease)   . Gout     Past Surgical History  Procedure Laterality Date  . Left and right heart catheterization with coronary angiogram N/A 11/25/2011    Procedure: LEFT AND RIGHT HEART CATHETERIZATION WITH CORONARY ANGIOGRAM;  Surgeon: Clent Demark, MD;  Location: [facility] CATH LAB;  Service: Cardiovascular;  Laterality: N/A;  . Percutaneous coronary stent intervention (pci-s) Right 11/25/2011    Procedure: PERCUTANEOUS CORONARY STENT INTERVENTION (PCI-S);  Surgeon: Clent Demark, MD;  Location: [facility] CATH LAB;  Service: Cardiovascular;  Laterality: Right;  . Left and right heart catheterization with coronary angiogram N/A 09/07/2012    Procedure: LEFT AND RIGHT HEART CATHETERIZATION WITH CORONARY ANGIOGRAM;   Surgeon: Birdie Riddle, MD;  Location: [facility] CATH LAB;  Service: Cardiovascular;  Laterality: N/A;  . Tonsillectomy  1950's  . Coronary angioplasty with stent placement  2013  . Cardiac catheterization  2014    History reviewed. No pertinent family history. Social History:  reports that she has quit smoking. Her smoking use included Cigarettes. She has a 10 pack-year smoking history. She has never used smokeless tobacco. She reports that she does not drink alcohol or use illicit drugs.  Allergies: No Known Allergies  Medications Prior to Admission  Medication Sig Dispense Refill  . acetaminophen (TYLENOL) 500 MG tablet Take 500 mg by mouth every 6 (six) hours as needed (pain).     Marland Kitchen allopurinol (ZYLOPRIM) 100 MG tablet Take 1 tablet (100 mg total) by mouth daily.    Marland Kitchen amLODipine (NORVASC) 5 MG tablet Take 1 tablet (5 mg total) by mouth daily. 30 tablet 1  . aspirin EC 81 MG EC tablet Take 1 tablet (81 mg total) by mouth daily.    . carvedilol (COREG) 3.125 MG tablet Take 1 tablet (3.125 mg total) by mouth 2 (two) times daily with a meal. 60 tablet 6  . furosemide (LASIX) 80 MG tablet Take 0.5 tablets (40 mg total) by mouth 2 (two) times daily. (Patient taking differently: Take 80 mg by mouth 2 (two) times daily. )    . isosorbide-hydrALAZINE (BIDIL) 20-37.5 MG per tablet Take 1 tablet by mouth 2 (two) times daily. 60 tablet 3  . linagliptin (TRADJENTA) 5 MG TABS tablet Take 1 tablet (5 mg total) by mouth daily. 30 tablet 1  . metolazone (ZAROXOLYN) 5 MG tablet Take 5 mg by mouth  daily.    . nitroGLYCERIN (NITROSTAT) 0.4 MG SL tablet Place 0.4 mg under the tongue every 5 (five) minutes as needed for chest pain.    Marland Kitchen omeprazole (PRILOSEC) 20 MG capsule Take 1 capsule (20 mg total) by mouth daily. (Patient taking differently: Take 20 mg by mouth daily as needed (acid reflux). ) 30 capsule 1  . potassium chloride (K-DUR,KLOR-CON) 10 MEQ tablet Take 10 mEq by mouth 2 (two) times daily.    Marland Kitchen  spironolactone (ALDACTONE) 25 MG tablet Take 25 mg by mouth daily.      Results for orders placed or performed during the hospital encounter of 02/01/15 (from the past 48 hour(s))  Basic metabolic panel     Status: Abnormal   Collection Time: 02/01/15 12:13 AM  Result Value Ref Range   Sodium 134 (L) 135 - 145 mmol/L   Potassium 3.9 3.5 - 5.1 mmol/L   Chloride 99 (L) 101 - 111 mmol/L   CO2 24 22 - 32 mmol/L   Glucose, Bld 136 (H) 65 - 99 mg/dL   BUN 59 (H) 6 - 20 mg/dL   Creatinine, Ser 1.69 (H) 0.44 - 1.00 mg/dL   Calcium 8.8 (L) 8.9 - 10.3 mg/dL   GFR calc non Af Amer 31 (L) >60 mL/min   GFR calc Af Amer 36 (L) >60 mL/min    Comment: (NOTE) The eGFR has been calculated using the CKD EPI equation. This calculation has not been validated in all clinical situations. eGFR's persistently <60 mL/min signify possible Chronic Kidney Disease.    Anion gap 11 5 - 15  CBC     Status: Abnormal   Collection Time: 02/01/15 12:13 AM  Result Value Ref Range   WBC 4.5 4.0 - 10.5 K/uL   RBC 5.53 (H) 3.87 - 5.11 MIL/uL   Hemoglobin 14.7 12.0 - 15.0 g/dL   HCT 44.0 36.0 - 46.0 %   MCV 79.6 78.0 - 100.0 fL   MCH 26.6 26.0 - 34.0 pg   MCHC 33.4 30.0 - 36.0 g/dL   RDW 17.8 (H) 11.5 - 15.5 %   Platelets 217 150 - 400 K/uL  Brain natriuretic peptide     Status: Abnormal   Collection Time: 02/01/15 12:14 AM  Result Value Ref Range   B Natriuretic Peptide 2513.7 (H) 0.0 - 100.0 pg/mL  I-Stat Troponin, ED (not at [facility])     Status: None   Collection Time: 02/01/15 12:21 AM  Result Value Ref Range   Troponin i, poc 0.00 0.00 - 0.08 ng/mL   Comment 3            Comment: Due to the release kinetics of cTnI, a negative result within the first hours of the onset of symptoms does not rule out myocardial infarction with certainty. If myocardial infarction is still suspected, repeat the test at appropriate intervals.   Digoxin level     Status: Abnormal   Collection Time: 02/01/15  2:11 AM  Result  Value Ref Range   Digoxin Level <0.2 (L) 0.8 - 2.0 ng/mL    Comment: RESULTS CONFIRMED BY MANUAL DILUTION  Glucose, capillary     Status: Abnormal   Collection Time: 02/01/15  6:16 AM  Result Value Ref Range   Glucose-Capillary 127 (H) 65 - 99 mg/dL   Dg Chest 2 View  01/31/2015   CLINICAL DATA:  Acute onset of shortness of breath and fluid retention. Leg swelling. Initial encounter.  EXAM: CHEST  2 VIEW  COMPARISON:  Chest radiograph performed 11/29/2014  FINDINGS: The lungs are well-aerated. Mild vague hazy bilateral opacities are noted. This could reflect mild pneumonia or possibly pulmonary edema, given the patient's symptoms. There is no evidence of pleural effusion or pneumothorax.  The heart is mildly enlarged. No acute osseous abnormalities are seen.  IMPRESSION: Vague mild hazy bilateral airspace opacities noted. This could reflect mild pneumonia or possibly pulmonary edema, given the patient's symptoms. Mild cardiomegaly noted.  Followup PA and lateral chest X-ray is recommended in 3-4 weeks following treatment, to ensure resolution and exclude underlying malignancy.   Electronically Signed   By: Garald Balding M.D.   On: 01/31/2015 23:02    Review of Systems  Constitutional: Positive for malaise/fatigue. Negative for fever and chills.  Eyes: Negative for double vision.  Respiratory: Positive for shortness of breath.   Cardiovascular: Positive for orthopnea, leg swelling and PND. Negative for chest pain and palpitations.  Gastrointestinal: Negative for nausea, vomiting and abdominal pain.  Genitourinary: Negative for dysuria.  Neurological: Negative for dizziness.    Blood pressure 173/107, pulse 92, temperature 98.2 F (36.8 C), temperature source Oral, resp. rate 20, height '5\' 6"'  (1.676 m), weight 73.12 kg (161 lb 3.2 oz), SpO2 98 %. Physical Exam  Constitutional: She is oriented to person, place, and time.  HENT:  Head: Normocephalic and atraumatic.  Eyes: Conjunctivae are  normal. Pupils are equal, round, and reactive to light. Left eye exhibits no discharge.  Neck: Normal range of motion. Neck supple. JVD present. No thyromegaly present.  Cardiovascular: Normal rate and regular rhythm.   Murmur (Soft systolic murmur and S3 gallop noted) heard. Respiratory:  Decreased breath sound at bases with bibasilar Rales noted  GI: Soft. Bowel sounds are normal. She exhibits no distension. There is no tenderness. There is no rebound.  Musculoskeletal:  No clubbing cyanosis 4+ edema noted  Neurological: She is alert and oriented to person, place, and time.     Assessment/Plan Acute on chronic systolic heart failure Coronary artery disease history of anteroseptal wall myocardial infarction in the past Hypertension Diabetes mellitus GERD Hypercholesteremia Anxiety disorder Chronic kidney disease stage III History of gouty arthritis Plan As per orders Dr.Kadakia will follow from a.m.  Charolette Forward 02/01/2015, 7:54 AM

## 2015-02-01 NOTE — ED Notes (Signed)
Gave pt about a cup full of ice chips, per Dr. Elesa Massed. Informed RN.

## 2015-02-01 NOTE — ED Notes (Signed)
2 RNs attempted IV starte; IV team consult placed; attempted to call report to floor was on hold for 5 minutes and no answer when called back; RN to call back

## 2015-02-02 LAB — GLUCOSE, CAPILLARY
GLUCOSE-CAPILLARY: 135 mg/dL — AB (ref 65–99)
GLUCOSE-CAPILLARY: 127 mg/dL — AB (ref 65–99)
Glucose-Capillary: 159 mg/dL — ABNORMAL HIGH (ref 65–99)
Glucose-Capillary: 148 mg/dL — ABNORMAL HIGH (ref 65–99)

## 2015-02-02 LAB — BASIC METABOLIC PANEL
ANION GAP: 11 (ref 5–15)
BUN: 62 mg/dL — AB (ref 6–20)
CALCIUM: 8.5 mg/dL — AB (ref 8.9–10.3)
CHLORIDE: 103 mmol/L (ref 101–111)
CO2: 22 mmol/L (ref 22–32)
Creatinine, Ser: 1.77 mg/dL — ABNORMAL HIGH (ref 0.44–1.00)
GFR calc Af Amer: 34 mL/min — ABNORMAL LOW (ref >60)
GFR, EST NON AFRICAN AMERICAN: 30 mL/min — AB (ref >60)
GLUCOSE: 111 mg/dL — AB (ref 65–99)
Potassium: 5.9 mmol/L — ABNORMAL HIGH (ref 3.5–5.1)
SODIUM: 136 mmol/L (ref 135–145)

## 2015-02-02 LAB — HEMOGLOBIN A1C
Hgb A1c MFr Bld: 7.2 % — ABNORMAL HIGH (ref 4.8–5.6)
Mean Plasma Glucose: 160 mg/dL

## 2015-02-02 MED ORDER — CARVEDILOL 3.125 MG PO TABS
3.1250 mg | ORAL_TABLET | Freq: Two times a day (BID) | ORAL | Status: DC
  Administered 2015-02-02 – 2015-02-06 (×9): 3.125 mg via ORAL
  Filled 2015-02-02 (×11): qty 1

## 2015-02-02 NOTE — Progress Notes (Signed)
Heart Failure Navigator Consult Note  Presentation: [name] presented with progressive increasing shortness of breath associated and leg swelling HPI: Patient is 64 year old female with past medical history significant for coronary artery disease history of anteroseptal wall MI in the past, history of recurrent congestive heart failure secondary to depressed LV systolic function, hypertension, and diabetes mellitus, GERD, hypercholesteremia, history of gouty arthritis, chronic kidney disease stage III, anxiety disorder, came to the ER complaining of progressive increasing shortness of breath associated with leg swelling for last 2 weeks. Patient also gives history of PND orthopnea. Denies palpitation lightheadedness or syncope. Denies any chest pain. States saw PMD last Monday and her diabetic dose was increased without much improvement. Patient states she has been eating salty food and drinking plenty of fluid lately. States she has gained approximately 20 pounds in last 3 weeks.   Past Medical History  Diagnosis Date  . Hypertension   . Coronary artery disease   . CHF (congestive heart failure)   . Shortness of breath dyspnea   . High cholesterol   . Type II diabetes mellitus   . GERD (gastroesophageal reflux disease)   . Gout     History   Social History  . Marital Status: Married    Spouse Name: N/A  . Number of Children: N/A  . Years of Education: N/A   Social History Main Topics  . Smoking status: Former Smoker -- 0.50 packs/day for 20 years    Types: Cigarettes  . Smokeless tobacco: Never Used     Comment: quit smoking in the 80's  . Alcohol Use: No  . Drug Use: No  . Sexual Activity: No   Other Topics Concern  . None   Social History Narrative    ECHO:Study Conclusions--09/23/14  - Left ventricle: The cavity size was mildly dilated. There was mild concentric hypertrophy. Systolic function was moderately to severely reduced. The estimated ejection  fraction was in the range of 30% to 35%. Hypokinesis of the anterior myocardium. Hypokinesis of the inferior myocardium. Hypokinesis of the lateral myocardium. Doppler parameters are consistent with abnormal left ventricular relaxation (grade 1 diastolic dysfunction). - Aortic valve: There was mild regurgitation. - Mitral valve: There was severe regurgitation. - Left atrium: The atrium was moderately to severely dilated. - Right ventricle: The cavity size was mildly dilated. Wall thickness was normal. Systolic function was moderately reduced. - Right atrium: The atrium was severely dilated. - Tricuspid valve: There was moderate-severe regurgitation. - Pulmonary arteries: Systolic pressure was moderately increased. PA peak pressure: 59 mm Hg (S). - Pericardium, extracardiac: There was a left pleural effusion.  Transthoracic echocardiography. M-mode, limited 2D, limited spectral Doppler, and color Doppler. Birthdate: Patient birthdate: Jul 21, 1950. Age: Patient is 64 yr old. Sex: Gender: female.  BMI: 27.8 kg/m^2. Blood pressure:   132/66 Patient status: Inpatient. Study date: Study date: 09/23/2014. Study time: 02:15 PM. Location: Bedside.  BNP    Component Value Date/Time   BNP 2513.7* 02/01/2015 0014    ProBNP    Component Value Date/Time   PROBNP 14027.0* 06/19/2014 1420     Education Assessment and Provision:  Detailed education and instructions provided on heart failure disease management including the following:  Signs and symptoms of Heart Failure When to call the physician Importance of daily weights Low sodium diet Fluid restriction Medication management Anticipated future follow-up appointments  Patient education given on each of the above topics.  Patient acknowledges understanding and acceptance of all instructions.  I know [name] from  previous admissions.  She tells me that her weight is up and she was recently at [facility]  office.  He told her to come on in to ED if her symptoms did not improve.  She tries to stick with a low sodium diet and weighs everyday.  She has no issue with getting or taking medications.  She cares for her husband and grandchildren at home.  Education Materials:  "Living Better With Heart Failure" Booklet, Daily Weight Tracker Tool    High Risk Criteria for Readmission and/or Poor Patient Outcomes:   EF <30%- No 30-35% with grade 1   2 or more admissions in 6 months- Yes 4/6 months  Difficult social situation- No  Demonstrates medication noncompliance- No   Barriers of Care:   Her ability to care for herself and the others in her home, compliance related to previous  Discharge Planning:   Plans to discharge to home with husband.  She would benefit from [facility] for ongoing symptom recognition and compliance reinforcement.

## 2015-02-02 NOTE — Progress Notes (Signed)
Utilization Review Completed.[name] T7/25/2016  

## 2015-02-02 NOTE — Progress Notes (Signed)
Ref: Annalucia Laino S, MD   Subjective:  Lesser weight gain this admission but was short of breath. Afebrile.  Objective:  Vital Signs in the last 24 hours: Temp:  [97.6 F (36.4 C)-98.3 F (36.8 C)] 97.7 F (36.5 C) (07/25 1308) Pulse Rate:  [63-80] 65 (07/25 1308) Cardiac Rhythm:  [-] Normal sinus rhythm (07/25 0814) Resp:  [16-20] 16 (07/25 1308) BP: (115-144)/(58-83) 115/58 mmHg (07/25 1308) SpO2:  [95 %-100 %] 99 % (07/25 1308) Weight:  [72.077 kg (158 lb 14.4 oz)] 72.077 kg (158 lb 14.4 oz) (07/25 0423)  Physical Exam: BP Readings from Last 1 Encounters:  02/02/15 115/58    Wt Readings from Last 1 Encounters:  02/02/15 72.077 kg (158 lb 14.4 oz)    Weight change: -1.043 kg (-2 lb 4.8 oz)  HEENT: [place]/AT, Eyes-Brown, PERL, EOMI, Conjunctiva-Pink, Sclera-Non-icteric Neck: No JVD, No bruit, Trachea midline. Lungs:  Clear, Bilateral. Cardiac:  Regular rhythm, normal S1 and S2, no S3. II/VI systolic murmur. Abdomen:  Soft, non-tender. Extremities:  2 + edema of lower leg and pre-sacral area present. No cyanosis. No clubbing. CNS: AxOx3, Cranial nerves grossly intact, moves all 4 extremities. Right handed. Skin: Warm and dry.   Intake/Output from previous day: 07/24 0701 - 07/25 0700 In: 1270 [P.O.:1270] Out: 1675 [Urine:1675]    Lab Results: BMET    Component Value Date/Time   NA 136 02/02/2015 0359   NA 138 02/01/2015 1020   NA 134* 02/01/2015 0013   K 5.9* 02/02/2015 0359   K 3.5 02/01/2015 1020   K 3.9 02/01/2015 0013   CL 103 02/02/2015 0359   CL 104 02/01/2015 1020   CL 99* 02/01/2015 0013   CO2 22 02/02/2015 0359   CO2 24 02/01/2015 1020   CO2 24 02/01/2015 0013   GLUCOSE 111* 02/02/2015 0359   GLUCOSE 211* 02/01/2015 1020   GLUCOSE 136* 02/01/2015 0013   BUN 62* 02/02/2015 0359   BUN 57* 02/01/2015 1020   BUN 59* 02/01/2015 0013   CREATININE 1.77* 02/02/2015 0359   CREATININE 1.71* 02/01/2015 1020   CREATININE 1.69* 02/01/2015 0013   CALCIUM 8.5*  02/02/2015 0359   CALCIUM 9.1 02/01/2015 1020   CALCIUM 8.8* 02/01/2015 0013   GFRNONAA 30* 02/02/2015 0359   GFRNONAA 31* 02/01/2015 1020   GFRNONAA 31* 02/01/2015 0013   GFRAA 34* 02/02/2015 0359   GFRAA 36* 02/01/2015 1020   GFRAA 36* 02/01/2015 0013   CBC    Component Value Date/Time   WBC 4.0 02/01/2015 1020   RBC 5.46* 02/01/2015 1020   HGB 14.3 02/01/2015 1020   HCT 43.8 02/01/2015 1020   PLT 227 02/01/2015 1020   MCV 80.2 02/01/2015 1020   MCH 26.2 02/01/2015 1020   MCHC 32.6 02/01/2015 1020   RDW 17.9* 02/01/2015 1020   LYMPHSABS 1.1 02/01/2015 1020   MONOABS 0.4 02/01/2015 1020   EOSABS 0.1 02/01/2015 1020   BASOSABS 0.0 02/01/2015 1020   HEPATIC Function Panel  Recent Labs  12/04/14 0505 12/07/14 0530 02/01/15 1020  PROT 6.4* 6.9 6.6   HEMOGLOBIN A1C No components found for: HGA1C,  MPG CARDIAC ENZYMES Lab Results  Component Value Date   CKTOTAL 59 09/07/2012   CKMB 3.2 09/07/2012   TROPONINI <0.03 11/29/2014   TROPONINI <0.03 11/28/2014   TROPONINI <0.03 11/28/2014   BNP  Recent Labs  04/02/14 0843 04/09/14 0451 06/19/14 1420  PROBNP 11219.0* 8206.0* 14027.0*   TSH  Recent Labs  02/03/14 1239 02/01/15 1020  TSH 1.990 2.961  CHOLESTEROL No results for input(s): CHOL in the last 8760 hours.  Scheduled Meds: . allopurinol  100 mg Oral Daily  . aspirin EC  81 mg Oral Daily  . carvedilol  3.125 mg Oral BID WC  . furosemide  40 mg Intravenous Q12H  . heparin  5,000 Units Subcutaneous 3 times per day  . insulin aspart  0-9 Units Subcutaneous TID WC  . isosorbide-hydrALAZINE  1 tablet Oral BID  . metolazone  5 mg Oral Daily  . pantoprazole  40 mg Oral Daily  . sodium chloride  3 mL Intravenous Q12H   Continuous Infusions:  PRN Meds:.sodium chloride, acetaminophen, nitroGLYCERIN, ondansetron (ZOFRAN) IV, sodium chloride  Assessment/Plan: Acute on chronic systolic heart failure Coronary artery disease history of anteroseptal wall  myocardial infarction in the past Hypertension Diabetes mellitus GERD Hypercholesteremia Anxiety disorder Chronic kidney disease stage III History of gouty arthritis   Continue diuresis.   LOS: 1 day    [name]  MD  02/02/2015, 7:01 PM

## 2015-02-03 LAB — BASIC METABOLIC PANEL
ANION GAP: 11 (ref 5–15)
BUN: 61 mg/dL — ABNORMAL HIGH (ref 6–20)
CO2: 26 mmol/L (ref 22–32)
Calcium: 8.7 mg/dL — ABNORMAL LOW (ref 8.9–10.3)
Chloride: 97 mmol/L — ABNORMAL LOW (ref 101–111)
Creatinine, Ser: 1.93 mg/dL — ABNORMAL HIGH (ref 0.44–1.00)
GFR calc Af Amer: 31 mL/min — ABNORMAL LOW (ref >60)
GFR, EST NON AFRICAN AMERICAN: 27 mL/min — AB (ref >60)
Glucose, Bld: 140 mg/dL — ABNORMAL HIGH (ref 65–99)
Potassium: 3.6 mmol/L (ref 3.5–5.1)
Sodium: 134 mmol/L — ABNORMAL LOW (ref 135–145)

## 2015-02-03 LAB — GLUCOSE, CAPILLARY
GLUCOSE-CAPILLARY: 178 mg/dL — AB (ref 65–99)
Glucose-Capillary: 129 mg/dL — ABNORMAL HIGH (ref 65–99)
Glucose-Capillary: 145 mg/dL — ABNORMAL HIGH (ref 65–99)
Glucose-Capillary: 147 mg/dL — ABNORMAL HIGH (ref 65–99)

## 2015-02-03 NOTE — Care Management Note (Signed)
Case Management Note  Patient Details  Name: [name] MRN: [id number] Date of Birth: 1950/09/16  Subjective/Objective:             CHF       Action/Plan: Home  Expected Discharge Date:  02/05/2015               Expected Discharge Plan:  Home/Self Care  In-House Referral:     Discharge planning Services  CM Consult   Status of Service:  Completed, signed off  Medicare Important Message Given:    Date Medicare IM Given:    Medicare IM give by:    Date Additional Medicare IM Given:    Additional Medicare Important Message give by:     If discussed at Long Length of Stay Meetings, dates discussed:    Additional Comments: CHF screening complete. Pt states she does daily weights and takes her medications as prescribed. States her energy level is low but she is able to complete her daily task such as cleaning, dressing, cooking and occasional grocery shopping. States her husband does most of the grocery shopping and cooking at home which he purchases lots of foods that are not low in sodium or heart healthy. She is interested in more teaching and classes on meal preparation and foods she should be eating at home. Provided pt with brochure for [facility] at [facility]. Explained they offer nutrition classes and meal prep classes. States she drinks water and eats lots of fruit during the day and some days will eat foods prepared by her husband she knows that do not follow the heart healthy guidelines. Explained to pt that some fruits have a high water content and nutrition classes will help her with meal planning and adequate portion sizes to manage both DM and CHF. Explained limiting her sodium and avoiding foods that have high sodium content will help with managing her CHF at home. Pt states she plans to follow up. NCM will continue to follow for dc needs.  Elliot Cousin, RN 02/03/2015, 5:15 PM

## 2015-02-03 NOTE — Progress Notes (Signed)
Ref: [name], MD   Subjective:  Breathing better. Good diuresis. Lost about 5 pounds of weight in 2 days.  Objective:  Vital Signs in the last 24 hours: Temp:  [97.7 F (36.5 C)-98.4 F (36.9 C)] 98.4 F (36.9 C) (07/25 2042) Pulse Rate:  [65-69] 69 (07/26 0707) Cardiac Rhythm:  [-] Normal sinus rhythm (07/25 2020) Resp:  [16-18] 16 (07/26 0707) BP: (115-145)/(58-84) 145/84 mmHg (07/26 0707) SpO2:  [99 %] 99 % (07/26 0707) Weight:  [69.854 kg (154 lb)] 69.854 kg (154 lb) (07/26 0707)  Physical Exam: BP Readings from Last 1 Encounters:  02/03/15 145/84    Wt Readings from Last 1 Encounters:  02/03/15 69.854 kg (154 lb)    Weight change:   HEENT: [place]/AT, Eyes-Brown, PERL, EOMI, Conjunctiva-Pink, Sclera-Non-icteric Neck: No JVD, No bruit, Trachea midline. Lungs:  Clear, Bilateral. Cardiac:  Regular rhythm, normal S1 and S2, no S3. II/VI systolic murmur. Abdomen:  Soft, non-tender. Extremities:  2 + edema present. No cyanosis. No clubbing. CNS: AxOx3, Cranial nerves grossly intact, moves all 4 extremities. Right handed. Skin: Warm and dry.   Intake/Output from previous day: 07/25 0701 - 07/26 0700 In: 680 [P.O.:680] Out: 3975 [Urine:3975]    Lab Results: BMET    Component Value Date/Time   NA 134* 02/03/2015 0356   NA 136 02/02/2015 0359   NA 138 02/01/2015 1020   K 3.6 02/03/2015 0356   K 5.9* 02/02/2015 0359   K 3.5 02/01/2015 1020   CL 97* 02/03/2015 0356   CL 103 02/02/2015 0359   CL 104 02/01/2015 1020   CO2 26 02/03/2015 0356   CO2 22 02/02/2015 0359   CO2 24 02/01/2015 1020   GLUCOSE 140* 02/03/2015 0356   GLUCOSE 111* 02/02/2015 0359   GLUCOSE 211* 02/01/2015 1020   BUN 61* 02/03/2015 0356   BUN 62* 02/02/2015 0359   BUN 57* 02/01/2015 1020   CREATININE 1.93* 02/03/2015 0356   CREATININE 1.77* 02/02/2015 0359   CREATININE 1.71* 02/01/2015 1020   CALCIUM 8.7* 02/03/2015 0356   CALCIUM 8.5* 02/02/2015 0359   CALCIUM 9.1 02/01/2015 1020    GFRNONAA 27* 02/03/2015 0356   GFRNONAA 30* 02/02/2015 0359   GFRNONAA 31* 02/01/2015 1020   GFRAA 31* 02/03/2015 0356   GFRAA 34* 02/02/2015 0359   GFRAA 36* 02/01/2015 1020   CBC    Component Value Date/Time   WBC 4.0 02/01/2015 1020   RBC 5.46* 02/01/2015 1020   HGB 14.3 02/01/2015 1020   HCT 43.8 02/01/2015 1020   PLT 227 02/01/2015 1020   MCV 80.2 02/01/2015 1020   MCH 26.2 02/01/2015 1020   MCHC 32.6 02/01/2015 1020   RDW 17.9* 02/01/2015 1020   LYMPHSABS 1.1 02/01/2015 1020   MONOABS 0.4 02/01/2015 1020   EOSABS 0.1 02/01/2015 1020   BASOSABS 0.0 02/01/2015 1020   HEPATIC Function Panel  Recent Labs  12/04/14 0505 12/07/14 0530 02/01/15 1020  PROT 6.4* 6.9 6.6   HEMOGLOBIN A1C No components found for: HGA1C,  MPG CARDIAC ENZYMES Lab Results  Component Value Date   CKTOTAL 59 09/07/2012   CKMB 3.2 09/07/2012   TROPONINI <0.03 11/29/2014   TROPONINI <0.03 11/28/2014   TROPONINI <0.03 11/28/2014   BNP  Recent Labs  04/02/14 0843 04/09/14 0451 06/19/14 1420  PROBNP 11219.0* 8206.0* 14027.0*   TSH  Recent Labs  02/03/14 1239 02/01/15 1020  TSH 1.990 2.961   CHOLESTEROL No results for input(s): CHOL in the last 8760 hours.  Scheduled Meds: .  allopurinol  100 mg Oral Daily  . aspirin EC  81 mg Oral Daily  . carvedilol  3.125 mg Oral BID WC  . furosemide  40 mg Intravenous Q12H  . heparin  5,000 Units Subcutaneous 3 times per day  . insulin aspart  0-9 Units Subcutaneous TID WC  . isosorbide-hydrALAZINE  1 tablet Oral BID  . metolazone  5 mg Oral Daily  . pantoprazole  40 mg Oral Daily  . 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     LOS: 2 days    [name]  MD  02/03/2015, 8:14 AM

## 2015-02-04 LAB — BASIC METABOLIC PANEL
Anion gap: 9 (ref 5–15)
BUN: 65 mg/dL — ABNORMAL HIGH (ref 6–20)
CALCIUM: 8.6 mg/dL — AB (ref 8.9–10.3)
CO2: 27 mmol/L (ref 22–32)
CREATININE: 1.71 mg/dL — AB (ref 0.44–1.00)
Chloride: 99 mmol/L — ABNORMAL LOW (ref 101–111)
GFR calc Af Amer: 36 mL/min — ABNORMAL LOW (ref >60)
GFR calc non Af Amer: 31 mL/min — ABNORMAL LOW (ref >60)
GLUCOSE: 117 mg/dL — AB (ref 65–99)
Potassium: 4.5 mmol/L (ref 3.5–5.1)
SODIUM: 135 mmol/L (ref 135–145)

## 2015-02-04 LAB — GLUCOSE, CAPILLARY
GLUCOSE-CAPILLARY: 188 mg/dL — AB (ref 65–99)
GLUCOSE-CAPILLARY: 147 mg/dL — AB (ref 65–99)
GLUCOSE-CAPILLARY: 210 mg/dL — AB (ref 65–99)
Glucose-Capillary: 154 mg/dL — ABNORMAL HIGH (ref 65–99)

## 2015-02-04 NOTE — Progress Notes (Signed)
Nutrition Education Note  RD consulted for nutrition education regarding low sodium diet for CHF.  Reviewed patient's dietary recall. Provided examples on ways to decrease sodium intake in diet. Discouraged intake of processed foods and use of salt shaker. Encouraged fresh fruits and vegetables as well as whole grain sources of carbohydrates to maximize fiber intake. Discussed tips for eating out and provided similar recipes (from [facility] heart.[email or web address]) that pt can prepare at home instead of eating out. RD provided "Sodium (salt) Content of Foods" handout from the Academy of Nutrition and Dietetics.  RD discussed why it is important for patient to adhere to diet recommendations, and emphasized the role of fluids, foods to avoid, and importance of weighing self daily. Teach back method used.  Expect good compliance.  Body mass index is 23.72 kg/(m^2). Pt meets criteria for Normal Weight based on current BMI.  Current diet order is Heart Healthy/Carb Modified, patient is consuming approximately 100% of meals at this time. Labs and medications reviewed. No further nutrition interventions warranted at this time. RD contact information provided. If additional nutrition issues arise, please re-consult RD.   Ian Malkin RD, LDN Inpatient Clinical Dietitian Pager: [phone] After Hours Pager: [phone]

## 2015-02-04 NOTE — Progress Notes (Signed)
Ref: [name], MD   Subjective:  Good diuresis. T max 99.1  Objective:  Vital Signs in the last 24 hours: Temp:  [97.9 F (36.6 C)-99.1 F (37.3 C)] 98.2 F (36.8 C) (07/27 0821) Pulse Rate:  [70-75] 74 (07/27 0821) Cardiac Rhythm:  [-] Normal sinus rhythm (07/26 2256) Resp:  [18-19] 18 (07/27 0821) BP: (112-145)/(57-83) 123/66 mmHg (07/27 0821) SpO2:  [96 %-100 %] 100 % (07/27 0821) Weight:  [66.633 kg (146 lb 14.4 oz)] 66.633 kg (146 lb 14.4 oz) (07/27 2956)  Physical Exam: BP Readings from Last 1 Encounters:  02/04/15 123/66    Wt Readings from Last 1 Encounters:  02/04/15 66.633 kg (146 lb 14.4 oz)    Weight change:   HEENT: [place]/AT, Eyes-Brown, PERL, EOMI, Conjunctiva-Pink, Sclera-Non-icteric Neck: No JVD, No bruit, Trachea midline. Lungs:  Clearing, Bilateral. Cardiac:  Regular rhythm, normal S1 and S2, no S3. II/VI systolic murmur.  Abdomen:  Soft, non-tender. Extremities:  1 + edema present. No cyanosis. No clubbing. CNS: AxOx3, Cranial nerves grossly intact, moves all 4 extremities. Right handed. Skin: Warm and dry.   Intake/Output from previous day: 07/26 0701 - 07/27 0700 In: 920 [P.O.:920] Out: 6000 [Urine:6000]    Lab Results: BMET    Component Value Date/Time   NA 135 02/04/2015 0358   NA 134* 02/03/2015 0356   NA 136 02/02/2015 0359   K 4.5 02/04/2015 0358   K 3.6 02/03/2015 0356   K 5.9* 02/02/2015 0359   CL 99* 02/04/2015 0358   CL 97* 02/03/2015 0356   CL 103 02/02/2015 0359   CO2 27 02/04/2015 0358   CO2 26 02/03/2015 0356   CO2 22 02/02/2015 0359   GLUCOSE 117* 02/04/2015 0358   GLUCOSE 140* 02/03/2015 0356   GLUCOSE 111* 02/02/2015 0359   BUN 65* 02/04/2015 0358   BUN 61* 02/03/2015 0356   BUN 62* 02/02/2015 0359   CREATININE 1.71* 02/04/2015 0358   CREATININE 1.93* 02/03/2015 0356   CREATININE 1.77* 02/02/2015 0359   CALCIUM 8.6* 02/04/2015 0358   CALCIUM 8.7* 02/03/2015 0356   CALCIUM 8.5* 02/02/2015 0359   GFRNONAA 31*  02/04/2015 0358   GFRNONAA 27* 02/03/2015 0356   GFRNONAA 30* 02/02/2015 0359   GFRAA 36* 02/04/2015 0358   GFRAA 31* 02/03/2015 0356   GFRAA 34* 02/02/2015 0359   CBC    Component Value Date/Time   WBC 4.0 02/01/2015 1020   RBC 5.46* 02/01/2015 1020   HGB 14.3 02/01/2015 1020   HCT 43.8 02/01/2015 1020   PLT 227 02/01/2015 1020   MCV 80.2 02/01/2015 1020   MCH 26.2 02/01/2015 1020   MCHC 32.6 02/01/2015 1020   RDW 17.9* 02/01/2015 1020   LYMPHSABS 1.1 02/01/2015 1020   MONOABS 0.4 02/01/2015 1020   EOSABS 0.1 02/01/2015 1020   BASOSABS 0.0 02/01/2015 1020   HEPATIC Function Panel  Recent Labs  12/04/14 0505 12/07/14 0530 02/01/15 1020  PROT 6.4* 6.9 6.6   HEMOGLOBIN A1C No components found for: HGA1C,  MPG CARDIAC ENZYMES Lab Results  Component Value Date   CKTOTAL 59 09/07/2012   CKMB 3.2 09/07/2012   TROPONINI <0.03 11/29/2014   TROPONINI <0.03 11/28/2014   TROPONINI <0.03 11/28/2014   BNP  Recent Labs  04/02/14 0843 04/09/14 0451 06/19/14 1420  PROBNP 11219.0* 8206.0* 14027.0*   TSH  Recent Labs  02/01/15 1020  TSH 2.961   CHOLESTEROL No results for input(s): CHOL in the last 8760 hours.  Scheduled Meds: . allopurinol  100  mg Oral Daily  . aspirin EC  81 mg Oral Daily  . carvedilol  3.125 mg Oral BID WC  . furosemide  40 mg Intravenous Q12H  . heparin  5,000 Units Subcutaneous 3 times per day  . insulin aspart  0-9 Units Subcutaneous TID WC  . isosorbide-hydrALAZINE  1 tablet Oral BID  . metolazone  5 mg Oral Daily  . pantoprazole  40 mg Oral Daily  . sodium chloride  3 mL Intravenous Q12H   Continuous Infusions:  PRN Meds:.sodium chloride, acetaminophen, nitroGLYCERIN, ondansetron (ZOFRAN) IV, sodium chloride  Assessment/Plan: Acute on chronic systolic heart failure Coronary artery disease history of anteroseptal wall myocardial infarction in the past Hypertension Diabetes mellitus GERD Hypercholesteremia Anxiety  disorder Chronic kidney disease stage III History of gouty arthritis  Continue medications.    LOS: 3 days    [name]  MD  02/04/2015, 9:07 AM

## 2015-02-05 LAB — GLUCOSE, CAPILLARY
GLUCOSE-CAPILLARY: 134 mg/dL — AB (ref 65–99)
GLUCOSE-CAPILLARY: 118 mg/dL — AB (ref 65–99)
GLUCOSE-CAPILLARY: 195 mg/dL — AB (ref 65–99)
Glucose-Capillary: 136 mg/dL — ABNORMAL HIGH (ref 65–99)

## 2015-02-05 NOTE — Progress Notes (Signed)
Ref: [name], MD   Subjective:  Good diuresis. Decreasing leg edema.  Objective:  Vital Signs in the last 24 hours: Temp:  [97.7 F (36.5 C)-98.6 F (37 C)] 97.7 F (36.5 C) (07/28 1336) Pulse Rate:  [66-76] 66 (07/28 1336) Cardiac Rhythm:  [-] Normal sinus rhythm (07/27 1926) Resp:  [18] 18 (07/28 0626) BP: (115-142)/(57-72) 115/57 mmHg (07/28 1336) SpO2:  [99 %-100 %] 99 % (07/28 1336) Weight:  [64.456 kg (142 lb 1.6 oz)] 64.456 kg (142 lb 1.6 oz) (07/28 1610)  Physical Exam: BP Readings from Last 1 Encounters:  02/05/15 115/57    Wt Readings from Last 1 Encounters:  02/05/15 64.456 kg (142 lb 1.6 oz)    Weight change: -5.398 kg (-11 lb 14.4 oz)  HEENT: [place]/AT, Eyes-Brown, PERL, EOMI, Conjunctiva-Pink, Sclera-Non-icteric Neck: No JVD, No bruit, Trachea midline. Lungs:  Clearing, Bilateral. Cardiac:  Regular rhythm, normal S1 and S2, no S3. II/VI systolic murmur. Abdomen:  Soft, non-tender. Extremities:  1 + edema present. No cyanosis. No clubbing. CNS: AxOx3, Cranial nerves grossly intact, moves all 4 extremities. Right handed. Skin: Warm and dry.   Intake/Output from previous day: 07/27 0701 - 07/28 0700 In: 1020 [P.O.:1020] Out: 3100 [Urine:3100]    Lab Results: BMET    Component Value Date/Time   NA 135 02/04/2015 0358   NA 134* 02/03/2015 0356   NA 136 02/02/2015 0359   K 4.5 02/04/2015 0358   K 3.6 02/03/2015 0356   K 5.9* 02/02/2015 0359   CL 99* 02/04/2015 0358   CL 97* 02/03/2015 0356   CL 103 02/02/2015 0359   CO2 27 02/04/2015 0358   CO2 26 02/03/2015 0356   CO2 22 02/02/2015 0359   GLUCOSE 117* 02/04/2015 0358   GLUCOSE 140* 02/03/2015 0356   GLUCOSE 111* 02/02/2015 0359   BUN 65* 02/04/2015 0358   BUN 61* 02/03/2015 0356   BUN 62* 02/02/2015 0359   CREATININE 1.71* 02/04/2015 0358   CREATININE 1.93* 02/03/2015 0356   CREATININE 1.77* 02/02/2015 0359   CALCIUM 8.6* 02/04/2015 0358   CALCIUM 8.7* 02/03/2015 0356   CALCIUM 8.5*  02/02/2015 0359   GFRNONAA 31* 02/04/2015 0358   GFRNONAA 27* 02/03/2015 0356   GFRNONAA 30* 02/02/2015 0359   GFRAA 36* 02/04/2015 0358   GFRAA 31* 02/03/2015 0356   GFRAA 34* 02/02/2015 0359   CBC    Component Value Date/Time   WBC 4.0 02/01/2015 1020   RBC 5.46* 02/01/2015 1020   HGB 14.3 02/01/2015 1020   HCT 43.8 02/01/2015 1020   PLT 227 02/01/2015 1020   MCV 80.2 02/01/2015 1020   MCH 26.2 02/01/2015 1020   MCHC 32.6 02/01/2015 1020   RDW 17.9* 02/01/2015 1020   LYMPHSABS 1.1 02/01/2015 1020   MONOABS 0.4 02/01/2015 1020   EOSABS 0.1 02/01/2015 1020   BASOSABS 0.0 02/01/2015 1020   HEPATIC Function Panel  Recent Labs  12/04/14 0505 12/07/14 0530 02/01/15 1020  PROT 6.4* 6.9 6.6   HEMOGLOBIN A1C No components found for: HGA1C,  MPG CARDIAC ENZYMES Lab Results  Component Value Date   CKTOTAL 59 09/07/2012   CKMB 3.2 09/07/2012   TROPONINI <0.03 11/29/2014   TROPONINI <0.03 11/28/2014   TROPONINI <0.03 11/28/2014   BNP  Recent Labs  04/02/14 0843 04/09/14 0451 06/19/14 1420  PROBNP 11219.0* 8206.0* 14027.0*   TSH  Recent Labs  02/01/15 1020  TSH 2.961   CHOLESTEROL No results for input(s): CHOL in the last 8760 hours.  Scheduled Meds: .  allopurinol  100 mg Oral Daily  . aspirin EC  81 mg Oral Daily  . carvedilol  3.125 mg Oral BID WC  . furosemide  40 mg Intravenous Q12H  . heparin  5,000 Units Subcutaneous 3 times per day  . insulin aspart  0-9 Units Subcutaneous TID WC  . isosorbide-hydrALAZINE  1 tablet Oral BID  . metolazone  5 mg Oral Daily  . pantoprazole  40 mg Oral Daily  . sodium chloride  3 mL Intravenous Q12H   Continuous Infusions:  PRN Meds:.sodium chloride, acetaminophen, nitroGLYCERIN, ondansetron (ZOFRAN) IV, sodium chloride  Assessment/Plan: Acute on chronic systolic heart failure Coronary artery disease  History of anteroseptal wall myocardial infarction in the past Hypertension Diabetes mellitus,  II GERD Hypercholesteremia Anxiety disorder Chronic kidney disease stage III History of gouty arthritis  Continue medications. Increase activity.     LOS: 4 days    [name]  MD  02/05/2015, 4:39 PM

## 2015-02-06 LAB — BASIC METABOLIC PANEL
Anion gap: 11 (ref 5–15)
BUN: 63 mg/dL — ABNORMAL HIGH (ref 6–20)
CALCIUM: 8.9 mg/dL (ref 8.9–10.3)
CO2: 30 mmol/L (ref 22–32)
CREATININE: 2.01 mg/dL — AB (ref 0.44–1.00)
Chloride: 95 mmol/L — ABNORMAL LOW (ref 101–111)
GFR calc non Af Amer: 25 mL/min — ABNORMAL LOW (ref >60)
GFR, EST AFRICAN AMERICAN: 29 mL/min — AB (ref >60)
Glucose, Bld: 151 mg/dL — ABNORMAL HIGH (ref 65–99)
Potassium: 3.7 mmol/L (ref 3.5–5.1)
Sodium: 136 mmol/L (ref 135–145)

## 2015-02-06 LAB — GLUCOSE, CAPILLARY: Glucose-Capillary: 168 mg/dL — ABNORMAL HIGH (ref 65–99)

## 2015-02-06 MED ORDER — FUROSEMIDE 80 MG PO TABS: 80.0000 mg | ORAL_TABLET | Freq: Two times a day (BID) | ORAL | Status: DC

## 2015-02-06 NOTE — Discharge Summary (Signed)
Physician Discharge Summary  Patient ID: [name] MRN: [id number] DOB/AGE: 11-17-1950 64 y.o.  Admit date: 02/01/2015 Discharge date: 02/06/2015  Admission Diagnoses: Acute on chronic systolic heart failure Coronary artery disease  History of anteroseptal wall myocardial infarction in the past Hypertension Diabetes mellitus, II GERD Hypercholesteremia Anxiety disorder Chronic kidney disease stage III History of gouty arthritis  Discharge Diagnoses:  Principle Problem: * Acute on chronic systolic heart failure * Coronary artery disease  History of anteroseptal wall myocardial infarction in the past Hypertension Diabetes mellitus, II GERD Hypercholesteremia Anxiety disorder Chronic kidney disease stage III History of gouty arthritis  Discharged Condition: fair  Hospital Course: Patient is 64 year old female with past medical history significant for coronary artery disease history of anteroseptal wall MI in the past, history of recurrent congestive heart failure secondary to depressed LV systolic function, hypertension, and diabetes mellitus, GERD, hypercholesteremia, history of gouty arthritis, chronic kidney disease stage III, anxiety disorder, came to the ER complaining of progressive increasing shortness of breath associated with leg swelling for last 2 weeks. She had significant diuresis with IV lasix and was discharged home with instructions to follow low fat and low salt diet, ambulate as tolerated, take medications regularly and keep follow up in 1 week.   Consults: cardiology  Significant Diagnostic Studies: labs: Normal CBC and BMET except elevated BUN/Cr and blood glucose. BNP was 2513.7  Treatments: cardiac meds: Bidil, carvedilol, amlodipine, metolazone and furosemide  Discharge Exam: Blood pressure 137/70, pulse 77, temperature 99 F (37.2 C), temperature source Oral, resp. rate 18, height  (1.676 m), weight 62.37 kg (137 lb 8 oz), SpO2 100 %. HEENT:  [place]/AT, Eyes-Brown, PERL, EOMI, Conjunctiva-Pink, Sclera-Non-icteric Neck: No JVD, No bruit, Trachea midline. Lungs: Clearing, Bilateral. Cardiac: Regular rhythm, normal S1 and S2, no S3. II/VI systolic murmur. Abdomen: Soft, non-tender. Extremities: Trace edema present. No cyanosis. No clubbing. CNS: AxOx3, Cranial nerves grossly intact, moves all 4 extremities. Right handed. Skin: Warm and dry.  Disposition: 01-Home or Self Care     Medication List    TAKE these medications        acetaminophen 500 MG tablet  Commonly known as:  TYLENOL  Take 500 mg by mouth every 6 (six) hours as needed (pain).     allopurinol 100 MG tablet  Commonly known as:  ZYLOPRIM  Take 1 tablet (100 mg total) by mouth daily.     amLODipine 5 MG tablet  Commonly known as:  NORVASC  Take 1 tablet (5 mg total) by mouth daily.     aspirin 81 MG EC tablet  Take 1 tablet (81 mg total) by mouth daily.     carvedilol 3.125 MG tablet  Commonly known as:  COREG  Take 1 tablet (3.125 mg total) by mouth 2 (two) times daily with a meal.     furosemide 80 MG tablet  Commonly known as:  LASIX  Take 1 tablet (80 mg total) by mouth 2 (two) times daily.     isosorbide-hydrALAZINE 20-37.5 MG per tablet  Commonly known as:  BIDIL  Take 1 tablet by mouth 2 (two) times daily.     linagliptin 5 MG Tabs tablet  Commonly known as:  TRADJENTA  Take 1 tablet (5 mg total) by mouth daily.     metolazone 5 MG tablet  Commonly known as:  ZAROXOLYN  Take 5 mg by mouth daily.     nitroGLYCERIN 0.4 MG SL tablet  Commonly known as:  NITROSTAT  Place 0.4 mg  under the tongue every 5 (five) minutes as needed for chest pain.     omeprazole 20 MG capsule  Commonly known as:  PRILOSEC  Take 1 capsule (20 mg total) by mouth daily.     potassium chloride 10 MEQ tablet  Commonly known as:  K-DUR,KLOR-CON  Take 10 mEq by mouth 2 (two) times daily.     spironolactone 25 MG tablet  Commonly known as:  ALDACTONE  Take  25 mg by mouth daily.           Follow-up Information    Follow up with [facility] S, MD. Schedule an appointment as soon as possible for a visit in 1 week.   Specialty:  Cardiology   Contact information:   [address] [place] [address] [phone]       Signed: [name] 02/06/2015, 8:54 AM

## 2015-02-06 NOTE — Progress Notes (Signed)
Pt has orders to be discharged. Discharge instructions given and pt has no additional questions at this time. Medication regimen reviewed and pt educated. Pt verbalized understanding and has no additional questions. Telemetry box removed. IV removed and site in good condition. Pt stable and waiting for transportation.   Lysle Yero RN 

## 2015-02-11 ENCOUNTER — Other Ambulatory Visit: Payer: Self-pay

## 2015-02-11 NOTE — Patient Outreach (Signed)
Patient phoned [facility] Care Management office on 02/10/2015 stating she wished a return call regarding Heart Failure program as she had a recent admission for Heart Failure.  Phoned patient this morning to discuss, left HIPPA compliant message for a return call.  [name]. [name] [facility] Care Management [facility] CM Assistant Phone: [phone] Fax: [phone]

## 2015-02-16 NOTE — Patient Outreach (Signed)
[facility]) Care Management  02/16/2015  [name] 1951/02/21 [id number]   Unable to contact patient to answer any questions regarding patient calling [facility] Care Management office prior.  Assigned [name], RN to outreach due to recent inpatient admission for Heart Failure.  [name]. [name] [facility] Care Management [facility] CM Assistant Phone: [phone] Fax: [phone]

## 2015-02-17 ENCOUNTER — Other Ambulatory Visit: Payer: Self-pay | Admitting: *Deleted

## 2015-02-17 NOTE — Patient Outreach (Signed)
Referral received from care manager assistant, L. Christell Constant, after member made self referral to [facility].  Member recently discharged after being hospitalized for heart failure exacerbation.  Per chart, member also has history of hypertension and diabetes.    Call placed to member to engage in the transition of care program, female answers the phone and states that the member is not home at the time.  Attempted to leave HIPPA compliant message for member, however the female requests this care manager to call back.  Will make another attempt to contact the member later this week.  [name], BSN, [facility] Care Management  [facility] Care Manager [phone]

## 2015-02-20 ENCOUNTER — Other Ambulatory Visit: Payer: Self-pay | Admitting: *Deleted

## 2015-02-20 DIAGNOSIS — I509 Heart failure, unspecified: Secondary | ICD-10-CM

## 2015-02-20 DIAGNOSIS — I10 Essential (primary) hypertension: Secondary | ICD-10-CM

## 2015-02-20 NOTE — Patient Outreach (Signed)
Second call placed to member to engage in [facility] care management services.  Member verifies identity, this care manager introduces self and purpose of call.  [facility] Care services explained, member is very appreciative of call and eager to be involved in program.  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  She states that she has applied for Medicaid and disability, and is completing the final paperwork for approval.  She reports that her husband has an aide that comes in to help with his bath, but she does not have anyone to help her on a daily basis.  She states that she has a daughter that is local, but she is not able to assist them as often as the member is needing help.  She also states that she is having financial difficulties, having trouble paying her bills and buying her medications, stating that she does not take her medications as prescribed because of the cost.  She reports that she take all of her medications at least once a day, but does take the second pill of those ordered for twice a day in effort to make them last longer.  Member notified that Child psychotherapist and pharmacy would be notified for additional resources.  Member encouraged to take medications as prescribed.  Member states that she does her best to follow her prescribed diet.  She reports that she does monitor her weight daily, but does not record it.  Encouraged to record weights to monitor trends.  Member acknowledges that she did receive CHF education prior to leaving hospital and is aware of action plan and zones.    Member agrees to have initial home visit next week.  She denies any other concerns at this time.  Encouraged to contact this care manager with questions.  Contact information provided to member.  [name], BSN, [facility] Care Management  [facility] Care Manager [phone]

## 2015-02-23 NOTE — Patient Outreach (Signed)
[facility]) Care Management  02/23/2015  JOLINDA PINKSTAFF [birth year] [id number]   Request from [name], RN to assign Pharmacy and SW, assigned Steve Rattler, PharmD and [place], [place].  Thanks, [name]. [name] [facility] Care Management [facility] CM Assistant Phone: [phone] Fax: [phone]

## 2015-02-25 ENCOUNTER — Other Ambulatory Visit: Payer: Self-pay | Admitting: *Deleted

## 2015-02-25 NOTE — Patient Outreach (Signed)
Call received from member inquiring about home visit.  Member states that she thought the visit was scheduled for this morning.  Made aware that the visit is for tomorrow morning.  Member reports that tomorrow morning will still be a good time for the visit.  Encouraged to contact this care manager if there was a need to reschedule.  [name], BSN, [facility] Care Management  [facility] Care Manager [phone]

## 2015-02-25 NOTE — Patient Outreach (Addendum)
[facility]) Care Management  02/25/2015  MEL TADROS [birth year] [id number]    Phone call to patient to assist with resources for financial assistance per [facility].  Phone rang several times.  No message able to be left.  This Child psychotherapist will call patient again within1 week.   Adriana Reams [facility] Care Management [phone]

## 2015-02-26 ENCOUNTER — Encounter: Payer: Self-pay | Admitting: *Deleted

## 2015-02-26 ENCOUNTER — Other Ambulatory Visit: Payer: Self-pay | Admitting: *Deleted

## 2015-02-26 NOTE — Patient Outreach (Signed)
[facility]) Care Management   02/26/2015  [name] 1950-10-29 [id number]  [name] is an 64 y.o. female  Subjective:   Objective:   Review of Systems  Constitutional: Negative.   HENT: Negative.   Eyes: Negative.   Respiratory: Negative.   Cardiovascular: Negative.   Gastrointestinal: Negative.   Genitourinary: Negative.   Musculoskeletal: Negative.   Skin: Negative.   Neurological: Negative.   Endo/Heme/Allergies: Negative.   Psychiatric/Behavioral: Negative.     Physical Exam  Constitutional: She is oriented to person, place, and time. She appears well-developed and well-nourished.  Neck: Normal range of motion.  Cardiovascular: Normal rate, regular rhythm and normal heart sounds.   Respiratory: Effort normal and breath sounds normal.  GI: Soft. Bowel sounds are normal.  Musculoskeletal: Normal range of motion.  Neurological: She is alert and oriented to person, place, and time.  Skin: Skin is warm and dry.   BP 142/80 mmHg  Pulse 78  Resp 18  Ht 1.626 m (_0 )  Wt 139 lb (63.05 kg)  BMI 23.85 kg/m2  SpO2 98%  Current Medications:   Current Outpatient Prescriptions  Medication Sig Dispense Refill  . acetaminophen (TYLENOL) 500 MG tablet Take 500 mg by mouth every 6 (six) hours as needed (pain).     Marland Kitchen allopurinol (ZYLOPRIM) 100 MG tablet Take 1 tablet (100 mg total) by mouth daily.    Marland Kitchen amLODipine (NORVASC) 5 MG tablet Take 1 tablet (5 mg total) by mouth daily. 30 tablet 1  . aspirin EC 81 MG EC tablet Take 1 tablet (81 mg total) by mouth daily.    . carvedilol (COREG) 3.125 MG tablet Take 1 tablet (3.125 mg total) by mouth 2 (two) times daily with a meal. 60 tablet 6  . furosemide (LASIX) 80 MG tablet Take 1 tablet (80 mg total) by mouth 2 (two) times daily.    . isosorbide-hydrALAZINE (BIDIL) 20-37.5 MG per tablet Take 1 tablet by mouth 2 (two) times daily. 60 tablet 3  . linagliptin (TRADJENTA) 5 MG TABS tablet Take 1 tablet  (5 mg total) by mouth daily. 30 tablet 1  . metolazone (ZAROXOLYN) 5 MG tablet Take 5 mg by mouth daily.    . nitroGLYCERIN (NITROSTAT) 0.4 MG SL tablet Place 0.4 mg under the tongue every 5 (five) minutes as needed for chest pain.    . potassium chloride (K-DUR,KLOR-CON) 10 MEQ tablet Take 10 mEq by mouth 2 (two) times daily.    Marland Kitchen omeprazole (PRILOSEC) 20 MG capsule Take 1 capsule (20 mg total) by mouth daily. (Patient not taking: Reported on 02/26/2015) 30 capsule 1  . spironolactone (ALDACTONE) 25 MG tablet Take 25 mg by mouth daily.     No current facility-administered medications for this visit.    Functional Status:   In your present state of health, do you have any difficulty performing the following activities: 02/26/2015 02/01/2015  Hearing? N N  Vision? Y N  Difficulty concentrating or making decisions? N N  Walking or climbing stairs? Y Y  Dressing or bathing? N N  Doing errands, shopping? N N  Preparing Food and eating ? N -  Using the Toilet? N -  In the past six months, have you accidently leaked urine? N -  Do you have problems with loss of bowel control? N -  Managing your Medications? N -  Managing your Finances? N -  Housekeeping or managing your Housekeeping? Y -    Fall/Depression Screening:  PHQ 2/9 Scores 02/26/2015  PHQ - 2 Score 1    Assessment:    Met with member at scheduled time.  [name] husband present during visit, but is not involved in discussion.  [facility] care manager services discussed once again, member agrees to continue with involvement.  Consent signed.  Member states again that she is looking to learn more about her heart failure and how to properly care for herself.    Member reports that she does have a scale and is able to weigh herself every day, but will now begin to record the readings.  She also states that she has been diagnosed with diabetes, but does not check her blood sugar on a regular basis.  She does have a glucose meter, member  encouraged to check glucose at least once a day and record readings (she does not take insulin).  Member has history of hypertension and currently take several blood pressure medications, but does not monitor blood pressure.  She states that she does have a machine in which she can self monitor and will begin to do so.  Member provided with [facility] calendar and advised to document self monitoring readings.    Medications reviewed.  Member has concerns regarding affordability of meds, made aware that [facility] pharmacist has been consulted and will be contacting her.  She reports that she does not use a pill box because she get confused.  Member provided with pill box, and educated on how to manage medications using them.  Daughter also present and educated on the use of the box.  Daughter states she will be able to assist member with medication management if needed.  Member states that she does not take her medications as ordered due to finances.  Member advised to take all medications as prescribed and allow [facility] pharmacist to explore other options for affording meds.  This care manager assisted member with filling pill box according to the current med list and prescriptions.  She is in need of refills for Allopurinol, Spironolactone, and Prilosec.   Member expresses some concerns regarding community resources as her yard is in need of some lawn care (trees need to be removed) and she reports not having the finances to do so.  She states that she has provided the [facility] worker with all necessary paperwork and is waiting feedback for approval.  She also reports that she has been advised to apply again for disability.  Member made aware that Education officer, museum, C. Land, attempted to contact her with no success.  Member provided with contact information for Mrs. Land and advised to call.  Member denies any further concerns at this time.  Contact information for this care manage provided, encouraged to contact with any  questions.  24 hour nurse line discussed, advised to call with medical related questions.  Plan:   Will contact pharmacist regarding medications assistance and the need for refills. Will continue with weekly transition of care calls next week. Routine home visit scheduled for next month.  [facility] CM Care Plan Problem One        Patient Outreach Telephone from 02/20/2015 in [place] Problem One  Recent hospitalization for heart failure exacerbation   Care Plan for Problem One  Active   THN Long Term Goal (31-90 days)  Member will not be readmitted to the hospital within the next 31 days   THN Long Term Goal Start Date  02/20/15   Interventions for Problem One Long  Term Goal  Discussed with member the importance of following discharge instructions, including follow up appointments, medications, diet, and home health involvement, to decrease the risk of readmission   THN CM Short Term Goal #1 (0-30 days)  Member will take medications as prescribed over the next 4 weeks   THN CM Short Term Goal #1 Start Date  02/20/15   Interventions for Short Term Goal #1  Discussed with member the importance of following discharge instructions, including follow up appointments, medications, diet, and home health involvement, to decrease the risk of readmission   THN CM Short Term Goal #2 (0-30 days)  Member will have follow up appointment with primary care provider within the next 4 weeks   THN CM Short Term Goal #2 Start Date  02/20/15   Interventions for Short Term Goal #2  Discussed with member the importance of following discharge instructions, including follow up appointments, medications, diet, and home health involvement, to decrease the risk of readmission   THN CM Short Term Goal #3 (0-30 days)  Member will weigh self and record reading daily for the next 4 weeks   THN CM Short Term Goal #3 Start Date  02/20/15   Interventions for Short Tern Goal #3  Discussed with member the  importance of following discharge instructions, including follow up appointments, medications, diet, and home health involvement, to decrease the risk of readmission     Valente David, BSN, [place] Manager [phone]

## 2015-02-27 ENCOUNTER — Other Ambulatory Visit: Payer: Self-pay | Admitting: Pharmacist

## 2015-02-27 NOTE — Patient Outreach (Signed)
[facility]) Care Management  [facility]   02/27/2015  [name] [birth year] [id number]  Subjective: [name] is a 64 y.o. female who was referred to [facility] CM Pharmacy for medication assistance. Patient is unable to afford her medications and has had to borrow from friends in the past in order to obtain her medications.   Patient reports that she has applied for Medicaid but it is not active yet.   Patient reports being in need of spironolactone, isosorbide dinitrate and hydralazine (BiDil), furosemide, Tradjenta, carvedilol, and potassium.  Patient's Medicare Part D plan is [facility]. [facility]    Objective:   Current Medications: Current Outpatient Prescriptions  Medication Sig Dispense Refill  . acetaminophen (TYLENOL) 500 MG tablet Take 500 mg by mouth every 6 (six) hours as needed (pain).     Marland Kitchen allopurinol (ZYLOPRIM) 100 MG tablet Take 1 tablet (100 mg total) by mouth daily.    Marland Kitchen amLODipine (NORVASC) 5 MG tablet Take 1 tablet (5 mg total) by mouth daily. 30 tablet 1  . aspirin EC 81 MG EC tablet Take 1 tablet (81 mg total) by mouth daily.    . carvedilol (COREG) 3.125 MG tablet Take 1 tablet (3.125 mg total) by mouth 2 (two) times daily with a meal. 60 tablet 6  . furosemide (LASIX) 80 MG tablet Take 1 tablet (80 mg total) by mouth 2 (two) times daily.    . isosorbide-hydrALAZINE (BIDIL) 20-37.5 MG per tablet Take 1 tablet by mouth 2 (two) times daily. 60 tablet 3  . linagliptin (TRADJENTA) 5 MG TABS tablet Take 1 tablet (5 mg total) by mouth daily. 30 tablet 1  . metolazone (ZAROXOLYN) 5 MG tablet Take 5 mg by mouth daily.    . nitroGLYCERIN (NITROSTAT) 0.4 MG SL tablet Place 0.4 mg under the tongue every 5 (five) minutes as needed for chest pain.    Marland Kitchen omeprazole (PRILOSEC) 20 MG capsule Take 1 capsule (20 mg total) by mouth daily. (Patient not taking: Reported on 02/26/2015) 30 capsule 1  .  potassium chloride (K-DUR,KLOR-CON) 10 MEQ tablet Take 10 mEq by mouth 2 (two) times daily.    Marland Kitchen spironolactone (ALDACTONE) 25 MG tablet Take 25 mg by mouth daily.     No current facility-administered medications for this visit.    Functional Status: In your present state of health, do you have any difficulty performing the following activities: 02/26/2015 02/01/2015  Hearing? N N  Vision? Y N  Difficulty concentrating or making decisions? N N  Walking or climbing stairs? Y Y  Dressing or bathing? N N  Doing errands, shopping? N N  Preparing Food and eating ? N -  Using the Toilet? N -  In the past six months, have you accidently leaked urine? N -  Do you have problems with loss of bowel control? N -  Managing your Medications? N -  Managing your Finances? N -  Housekeeping or managing your Housekeeping? Y -    Fall/Depression Screening: PHQ 2/9 Scores 02/26/2015  PHQ - 2 Score 1    Assessment: 1. Medication assistance: patient has [facility] and would likely benefit from using the mail order with them that would provide many of her generics at a $0 copay. Patient is Medicaid-pending which will also help with the cost of her medications that are not a $0 copay.     Plan: 1. Medication assistance: I called the patient to  discuss medication assistance and I was unable to leave a message for patient to return my phone call.  I will reach out on 03/04/15 if the patient does not return my call.    Juanita Craver, PharmD, BCPS Clinical Pharmacist [facility] [phone]

## 2015-03-04 ENCOUNTER — Other Ambulatory Visit: Payer: Self-pay | Admitting: Pharmacist

## 2015-03-04 NOTE — Patient Outreach (Signed)
[facility]) Care Management  [facility]   03/04/2015  [name] Aug 03, 1950 [id number]  Subjective: [name] is a 64 y.o. female who was referred to [facility] CM Pharmacy for medication assistance. Patient is unable to afford her medications and has had to borrow from friends in the past in order to obtain her medications.   Patient reports that she has applied for Medicaid but it is not active yet.   Patient reports being in need of spironolactone, isosorbide dinitrate and hydralazine (BiDil), furosemide, Tradjenta, carvedilol, and potassium.  Patient's Medicare Part D plan is [facility]. [facility]    Objective:   Current Medications: Current Outpatient Prescriptions  Medication Sig Dispense Refill  . acetaminophen (TYLENOL) 500 MG tablet Take 500 mg by mouth every 6 (six) hours as needed (pain).     Marland Kitchen allopurinol (ZYLOPRIM) 100 MG tablet Take 1 tablet (100 mg total) by mouth daily.    Marland Kitchen amLODipine (NORVASC) 5 MG tablet Take 1 tablet (5 mg total) by mouth daily. 30 tablet 1  . aspirin EC 81 MG EC tablet Take 1 tablet (81 mg total) by mouth daily.    . carvedilol (COREG) 3.125 MG tablet Take 1 tablet (3.125 mg total) by mouth 2 (two) times daily with a meal. 60 tablet 6  . furosemide (LASIX) 80 MG tablet Take 1 tablet (80 mg total) by mouth 2 (two) times daily.    . isosorbide-hydrALAZINE (BIDIL) 20-37.5 MG per tablet Take 1 tablet by mouth 2 (two) times daily. 60 tablet 3  . linagliptin (TRADJENTA) 5 MG TABS tablet Take 1 tablet (5 mg total) by mouth daily. 30 tablet 1  . metolazone (ZAROXOLYN) 5 MG tablet Take 5 mg by mouth daily.    . nitroGLYCERIN (NITROSTAT) 0.4 MG SL tablet Place 0.4 mg under the tongue every 5 (five) minutes as needed for chest pain.    Marland Kitchen omeprazole (PRILOSEC) 20 MG capsule Take 1 capsule (20 mg total) by mouth daily. (Patient not taking: Reported on 02/26/2015) 30 capsule 1  .  potassium chloride (K-DUR,KLOR-CON) 10 MEQ tablet Take 10 mEq by mouth 2 (two) times daily.    Marland Kitchen spironolactone (ALDACTONE) 25 MG tablet Take 25 mg by mouth daily.     No current facility-administered medications for this visit.    Functional Status: In your present state of health, do you have any difficulty performing the following activities: 02/26/2015 02/01/2015  Hearing? N N  Vision? Y N  Difficulty concentrating or making decisions? N N  Walking or climbing stairs? Y Y  Dressing or bathing? N N  Doing errands, shopping? N N  Preparing Food and eating ? N -  Using the Toilet? N -  In the past six months, have you accidently leaked urine? N -  Do you have problems with loss of bowel control? N -  Managing your Medications? N -  Managing your Finances? N -  Housekeeping or managing your Housekeeping? Y -    Fall/Depression Screening: PHQ 2/9 Scores 02/26/2015  PHQ - 2 Score 1    Assessment: 1. Medication assistance: patient needs medications: spironolactone, carvedilol, furosemide, Tradjenta, and BiDil.   Plan: 1. Medication assistance: Transferred patient's medications to [facility] and once they were run under the patient's insurance, they were all a $0 copay. [facility] must have been running it under her insurance incorrectly. Patient will go pick up her medications tomorrow.  Juanita Craver, PharmD, BCPS Clinical Pharmacist [facility] [phone]

## 2015-03-04 NOTE — Patient Outreach (Signed)
[facility]) Care Management  [facility]   03/04/2015  [name] August 13, 1950 [id number]  Subjective: [name] is a 64 y.o. female who was referred to [facility] CM Pharmacy for medication assistance. Patient is unable to afford her medications and has had to borrow from friends in the past in order to obtain her medications.   Patient reports that she has applied for Medicaid but it is not active yet.   Patient reports being in need of spironolactone, isosorbide dinitrate and hydralazine (BiDil), furosemide, Tradjenta, carvedilol, and potassium.  Patient's Medicare Part D plan is [facility]. [facility]    Objective:   Current Medications: Current Outpatient Prescriptions  Medication Sig Dispense Refill  . acetaminophen (TYLENOL) 500 MG tablet Take 500 mg by mouth every 6 (six) hours as needed (pain).     Marland Kitchen allopurinol (ZYLOPRIM) 100 MG tablet Take 1 tablet (100 mg total) by mouth daily.    Marland Kitchen amLODipine (NORVASC) 5 MG tablet Take 1 tablet (5 mg total) by mouth daily. 30 tablet 1  . aspirin EC 81 MG EC tablet Take 1 tablet (81 mg total) by mouth daily.    . carvedilol (COREG) 3.125 MG tablet Take 1 tablet (3.125 mg total) by mouth 2 (two) times daily with a meal. 60 tablet 6  . furosemide (LASIX) 80 MG tablet Take 1 tablet (80 mg total) by mouth 2 (two) times daily.    . isosorbide-hydrALAZINE (BIDIL) 20-37.5 MG per tablet Take 1 tablet by mouth 2 (two) times daily. 60 tablet 3  . linagliptin (TRADJENTA) 5 MG TABS tablet Take 1 tablet (5 mg total) by mouth daily. 30 tablet 1  . metolazone (ZAROXOLYN) 5 MG tablet Take 5 mg by mouth daily.    . nitroGLYCERIN (NITROSTAT) 0.4 MG SL tablet Place 0.4 mg under the tongue every 5 (five) minutes as needed for chest pain.    Marland Kitchen omeprazole (PRILOSEC) 20 MG capsule Take 1 capsule (20 mg total) by mouth daily. (Patient not taking: Reported on 02/26/2015) 30 capsule 1  .  potassium chloride (K-DUR,KLOR-CON) 10 MEQ tablet Take 10 mEq by mouth 2 (two) times daily.    Marland Kitchen spironolactone (ALDACTONE) 25 MG tablet Take 25 mg by mouth daily.     No current facility-administered medications for this visit.    Functional Status: In your present state of health, do you have any difficulty performing the following activities: 02/26/2015 02/01/2015  Hearing? N N  Vision? Y N  Difficulty concentrating or making decisions? N N  Walking or climbing stairs? Y Y  Dressing or bathing? N N  Doing errands, shopping? N N  Preparing Food and eating ? N -  Using the Toilet? N -  In the past six months, have you accidently leaked urine? N -  Do you have problems with loss of bowel control? N -  Managing your Medications? N -  Managing your Finances? N -  Housekeeping or managing your Housekeeping? Y -    Fall/Depression Screening: PHQ 2/9 Scores 02/26/2015  PHQ - 2 Score 1    Assessment: 1. Medication assistance: patient will be getting rid of [employer] Part D plan this year and does not want to start using mail order. Therefore, the only long term solution would be that she receives Medicaid and that application is still pending.    Plan: 1. Medication assistance: Will discuss using emergency funds with Steve Rattler, PharmD for this  patient's medications. Will follow up with patient once I have talked to [facility].     Juanita Craver, PharmD, BCPS Clinical Pharmacist [facility] [phone]

## 2015-03-05 ENCOUNTER — Other Ambulatory Visit: Payer: Self-pay | Admitting: *Deleted

## 2015-03-05 NOTE — Patient Outreach (Addendum)
[facility]) Care Management  03/05/2015  [name] 01-13-1951 [id number]   Phone call to patient to assess for community resource needs.  Per RNCM , patient is having trouble affording her medications and keeping up with her household expenses.  Per patient, she has applied for Medicaid approximately 3 weeks ago, however has not received a final answer regarding her eligibility.  Patient discussed applying for disability 3 separate times and has been declined, however was urged to apply again. Per patient, she has an appointment with social security on 03/05/15 at 12:45pm to start the process to re-apply for disability.  This Child psychotherapist discussed referral to the [employer] and other community agencies that provide emergency assistance, however patient declined stating that her financial difficulties were mostly around her medication cost.  Per patient  her husbands income takes care of the utilities and the mortgage and she is responsible for her medication cost.   Per patient, if she could receive assistance with her medication cost, she would be okay financially. Patient has been referred to pharmacy for possible medication assistance.  Patient also discussed multiple trees and debris being in her backyard that needs to be cleared.  Patient states that she did have a company volunteer to remove the trees that were already down but they could not do any other clearing.  Per patient they do not have the funds to get the rest of the backyard cleared.  Per patient, they have already contacted [employer] that assisted with getting a ramp built.   Plan;  This Child psychotherapist will follow up with patient next month regarding the status of her Medicaid application.  [facility] CM Care Plan Problem One        Most Recent Value   Care Plan Problem One  financial  problems   Role Documenting the Problem One  Clinical Social Worker   Care Plan for Problem One  Active    THN CM Short Term Goal #1 (0-30 days)  patient will  verbalize status of Medicaid application within the next 30 days   THN CM Short Term Goal #1 Start Date  03/04/15   Interventions for Short Term Goal #1  encouraged patient to follow up with the county social worker regarding the status of  her Meddicaid appliicaton       [place] Urieta Lonn [facility] Care Management [phone]

## 2015-03-06 ENCOUNTER — Other Ambulatory Visit: Payer: Self-pay | Admitting: *Deleted

## 2015-03-06 NOTE — Patient Outreach (Signed)
Weekly transition of care call placed to member.  She reports that she is doing well.  She states that she has been contacted by C. Land, Child psychotherapist, and by Ivar Drape, pharmacist, for additional resources.  Member reports that she has had refills called into the [facility].  She states that she will pick them up today.  Member instructed to call pharmacy (number provided) to add Allopurinol to the list for refill. She verbalizes understanding.  Member reports that Mrs. Land will assist her with finding resources to repairs that are needed at the home.  She states that she has been weighing herself daily, and has been monitoring her blood pressure, but have not been documenting it.  Member encouraged to document readings to have a trend for better management.  She reports that since [facility] has provided her with resources, she has now been able to start taking her medications as prescribed.  Encouraged to continue taking as prescribed and to notify this care manager of any concerns.  Member reports that she has applied again for disability, and was denied.  She states that she is in need of additional income, however is unable to perform typical job duties (she is unable to lift, unable to walk long distances without breaks, etc).  Member encouraged to look into positions that are less strenuous, such as [employer] greeters, or clerical/administrative work.  She was also encouraged to reach out to C. Land to inquire about other resources.  Member denies any other questions at this time.  Will continue with transition of care calls next week.  [name], BSN, [facility] Care Management  [facility] Care Manager [phone]

## 2015-03-12 ENCOUNTER — Other Ambulatory Visit: Payer: Self-pay | Admitting: *Deleted

## 2015-03-12 NOTE — Patient Outreach (Signed)
Weekly transition of care call placed to member.  Member states that she is doing well.  She reports that she has been taking her medications as prescribed since our last visit and has noticed a decrease in her blood pressure.  She reports that she will continue to take them as instructed, and will begin to monitor/record her blood pressure and weights daily.    Member denies any concerns this time.  She is made aware that her transition of care program is complete and that she will now have monthly home visits while working on her goals instead of weekly calls.  Member encouraged to contact this care manger with any questions.  [name], BSN, [facility] Care Management  [facility] Care Manager [phone]

## 2015-03-17 ENCOUNTER — Encounter: Payer: Self-pay | Admitting: *Deleted

## 2015-04-01 ENCOUNTER — Inpatient Hospital Stay (HOSPITAL_COMMUNITY)
Admission: AD | Admit: 2015-04-01 | Discharge: 2015-04-07 | DRG: 292 | Disposition: A | Discharge status: Home / Self Care | Payer: 59 | Source: Ambulatory Visit | Attending: Cardiovascular Disease | Admitting: Cardiovascular Disease

## 2015-04-01 ENCOUNTER — Encounter (HOSPITAL_COMMUNITY): Payer: Self-pay | Admitting: General Practice

## 2015-04-01 ENCOUNTER — Other Ambulatory Visit: Payer: Self-pay | Admitting: *Deleted

## 2015-04-01 DIAGNOSIS — Z9111 Patient's noncompliance with dietary regimen: Secondary | ICD-10-CM | POA: Diagnosis present

## 2015-04-01 DIAGNOSIS — I5082 Biventricular heart failure: Secondary | ICD-10-CM

## 2015-04-01 DIAGNOSIS — R0602 Shortness of breath: Secondary | ICD-10-CM | POA: Diagnosis present

## 2015-04-01 DIAGNOSIS — E780 Pure hypercholesterolemia: Secondary | ICD-10-CM | POA: Diagnosis present

## 2015-04-01 DIAGNOSIS — I129 Hypertensive chronic kidney disease with stage 1 through stage 4 chronic kidney disease, or unspecified chronic kidney disease: Secondary | ICD-10-CM | POA: Diagnosis present

## 2015-04-01 DIAGNOSIS — I5023 Acute on chronic systolic (congestive) heart failure: Secondary | ICD-10-CM | POA: Diagnosis present

## 2015-04-01 DIAGNOSIS — Z87891 Personal history of nicotine dependence: Secondary | ICD-10-CM

## 2015-04-01 DIAGNOSIS — Z23 Encounter for immunization: Secondary | ICD-10-CM | POA: Diagnosis not present

## 2015-04-01 DIAGNOSIS — E1122 Type 2 diabetes mellitus with diabetic chronic kidney disease: Secondary | ICD-10-CM | POA: Diagnosis present

## 2015-04-01 DIAGNOSIS — I10 Essential (primary) hypertension: Secondary | ICD-10-CM | POA: Diagnosis present

## 2015-04-01 DIAGNOSIS — N179 Acute kidney failure, unspecified: Secondary | ICD-10-CM | POA: Diagnosis present

## 2015-04-01 DIAGNOSIS — F419 Anxiety disorder, unspecified: Secondary | ICD-10-CM | POA: Diagnosis present

## 2015-04-01 DIAGNOSIS — E1129 Type 2 diabetes mellitus with other diabetic kidney complication: Secondary | ICD-10-CM

## 2015-04-01 DIAGNOSIS — N182 Chronic kidney disease, stage 2 (mild): Secondary | ICD-10-CM | POA: Diagnosis present

## 2015-04-01 DIAGNOSIS — I509 Heart failure, unspecified: Secondary | ICD-10-CM

## 2015-04-01 DIAGNOSIS — K219 Gastro-esophageal reflux disease without esophagitis: Secondary | ICD-10-CM | POA: Diagnosis present

## 2015-04-01 DIAGNOSIS — Z955 Presence of coronary angioplasty implant and graft: Secondary | ICD-10-CM

## 2015-04-01 LAB — CBC WITH DIFFERENTIAL/PLATELET
BASOS ABS: 0 10*3/uL (ref 0.0–0.1)
BASOS PCT: 0 %
Eosinophils Absolute: 0 10*3/uL (ref 0.0–0.7)
Eosinophils Relative: 1 %
HEMATOCRIT: 41.4 % (ref 36.0–46.0)
HEMOGLOBIN: 13.3 g/dL (ref 12.0–15.0)
Lymphocytes Relative: 30 %
Lymphs Abs: 1.4 10*3/uL (ref 0.7–4.0)
MCH: 26.2 pg (ref 26.0–34.0)
MCHC: 32.1 g/dL (ref 30.0–36.0)
MCV: 81.7 fL (ref 78.0–100.0)
MONOS PCT: 9 %
Monocytes Absolute: 0.4 10*3/uL (ref 0.1–1.0)
NEUTROS ABS: 2.7 10*3/uL (ref 1.7–7.7)
NEUTROS PCT: 60 %
Platelets: 196 10*3/uL (ref 150–400)
RBC: 5.07 MIL/uL (ref 3.87–5.11)
RDW: 19.4 % — ABNORMAL HIGH (ref 11.5–15.5)
WBC: 4.5 10*3/uL (ref 4.0–10.5)

## 2015-04-01 LAB — COMPREHENSIVE METABOLIC PANEL
ALBUMIN: 3.1 g/dL — AB (ref 3.5–5.0)
ALK PHOS: 74 U/L (ref 38–126)
ALT: 12 U/L — AB (ref 14–54)
AST: 23 U/L (ref 15–41)
Anion gap: 9 (ref 5–15)
BILIRUBIN TOTAL: 2.1 mg/dL — AB (ref 0.3–1.2)
BUN: 43 mg/dL — AB (ref 6–20)
CALCIUM: 8.7 mg/dL — AB (ref 8.9–10.3)
CO2: 24 mmol/L (ref 22–32)
CREATININE: 1.51 mg/dL — AB (ref 0.44–1.00)
Chloride: 102 mmol/L (ref 101–111)
GFR calc Af Amer: 41 mL/min — ABNORMAL LOW (ref >60)
GFR calc non Af Amer: 36 mL/min — ABNORMAL LOW (ref >60)
GLUCOSE: 176 mg/dL — AB (ref 65–99)
Potassium: 4.1 mmol/L (ref 3.5–5.1)
Sodium: 135 mmol/L (ref 135–145)
TOTAL PROTEIN: 6.5 g/dL (ref 6.5–8.1)

## 2015-04-01 LAB — GLUCOSE, CAPILLARY
GLUCOSE-CAPILLARY: 122 mg/dL — AB (ref 65–99)
Glucose-Capillary: 135 mg/dL — ABNORMAL HIGH (ref 65–99)
Glucose-Capillary: 161 mg/dL — ABNORMAL HIGH (ref 65–99)

## 2015-04-01 LAB — TROPONIN I
Troponin I: 0.04 ng/mL — ABNORMAL HIGH (ref <0.031)
Troponin I: <0.03 ng/mL (ref <0.031)

## 2015-04-01 LAB — BRAIN NATRIURETIC PEPTIDE: B Natriuretic Peptide: 2855 pg/mL — ABNORMAL HIGH (ref 0.0–100.0)

## 2015-04-01 MED ORDER — FUROSEMIDE 10 MG/ML IJ SOLN
40.0000 mg | Freq: Two times a day (BID) | INTRAMUSCULAR | Status: DC
  Administered 2015-04-01 – 2015-04-04 (×7): 40 mg via INTRAVENOUS
  Filled 2015-04-01 (×6): qty 4

## 2015-04-01 MED ORDER — NITROGLYCERIN 0.4 MG SL SUBL: 0.4000 mg | SUBLINGUAL_TABLET | SUBLINGUAL | Status: DC | PRN

## 2015-04-01 MED ORDER — ASPIRIN EC 81 MG PO TBEC
81.0000 mg | DELAYED_RELEASE_TABLET | Freq: Every day | ORAL | Status: DC
  Administered 2015-04-01 – 2015-04-07 (×7): 81 mg via ORAL
  Filled 2015-04-01 (×7): qty 1

## 2015-04-01 MED ORDER — POTASSIUM CHLORIDE CRYS ER 10 MEQ PO TBCR
10.0000 meq | EXTENDED_RELEASE_TABLET | Freq: Two times a day (BID) | ORAL | Status: DC
  Administered 2015-04-01 – 2015-04-02 (×4): 10 meq via ORAL
  Filled 2015-04-01 (×4): qty 1

## 2015-04-01 MED ORDER — CARVEDILOL 3.125 MG PO TABS
3.1250 mg | ORAL_TABLET | Freq: Two times a day (BID) | ORAL | Status: DC
  Administered 2015-04-01 – 2015-04-06 (×11): 3.125 mg via ORAL
  Filled 2015-04-01 (×11): qty 1

## 2015-04-01 MED ORDER — SODIUM CHLORIDE 0.9 % IV SOLN: 250.0000 mL | INTRAVENOUS | Status: DC | PRN

## 2015-04-01 MED ORDER — HEPARIN SODIUM (PORCINE) 5000 UNIT/ML IJ SOLN
5000.0000 [IU] | Freq: Three times a day (TID) | INTRAMUSCULAR | Status: DC
  Administered 2015-04-01 – 2015-04-05 (×12): 5000 [IU] via SUBCUTANEOUS
  Filled 2015-04-01 (×12): qty 1

## 2015-04-01 MED ORDER — LINAGLIPTIN 5 MG PO TABS
5.0000 mg | ORAL_TABLET | Freq: Every day | ORAL | Status: DC
  Administered 2015-04-01 – 2015-04-07 (×7): 5 mg via ORAL
  Filled 2015-04-01 (×7): qty 1

## 2015-04-01 MED ORDER — AMLODIPINE BESYLATE 5 MG PO TABS
5.0000 mg | ORAL_TABLET | Freq: Every day | ORAL | Status: DC
  Administered 2015-04-01 – 2015-04-07 (×7): 5 mg via ORAL
  Filled 2015-04-01 (×7): qty 1

## 2015-04-01 MED ORDER — SPIRONOLACTONE 25 MG PO TABS
25.0000 mg | ORAL_TABLET | Freq: Every day | ORAL | Status: DC
  Administered 2015-04-01 – 2015-04-07 (×7): 25 mg via ORAL
  Filled 2015-04-01 (×8): qty 1

## 2015-04-01 MED ORDER — SODIUM CHLORIDE 0.9 % IJ SOLN: 3.0000 mL | INTRAMUSCULAR | Status: DC | PRN

## 2015-04-01 MED ORDER — ALLOPURINOL 100 MG PO TABS
100.0000 mg | ORAL_TABLET | Freq: Two times a day (BID) | ORAL | Status: DC
  Administered 2015-04-01 – 2015-04-07 (×13): 100 mg via ORAL
  Filled 2015-04-01 (×14): qty 1

## 2015-04-01 MED ORDER — METOLAZONE 5 MG PO TABS
5.0000 mg | ORAL_TABLET | Freq: Every day | ORAL | Status: DC
  Administered 2015-04-01 – 2015-04-07 (×7): 5 mg via ORAL
  Filled 2015-04-01 (×7): qty 1

## 2015-04-01 MED ORDER — INSULIN ASPART 100 UNIT/ML ~~LOC~~ SOLN
0.0000 [IU] | Freq: Three times a day (TID) | SUBCUTANEOUS | Status: DC
  Administered 2015-04-01: 2 [IU] via SUBCUTANEOUS
  Administered 2015-04-02: 3 [IU] via SUBCUTANEOUS
  Administered 2015-04-03: 1 [IU] via SUBCUTANEOUS
  Administered 2015-04-03 – 2015-04-04 (×3): 2 [IU] via SUBCUTANEOUS
  Administered 2015-04-04 – 2015-04-05 (×2): 1 [IU] via SUBCUTANEOUS
  Administered 2015-04-05: 3 [IU] via SUBCUTANEOUS
  Administered 2015-04-06 (×2): 1 [IU] via SUBCUTANEOUS

## 2015-04-01 MED ORDER — ACETAMINOPHEN 325 MG PO TABS
650.0000 mg | ORAL_TABLET | ORAL | Status: DC | PRN
  Administered 2015-04-06: 650 mg via ORAL
  Filled 2015-04-01: qty 2

## 2015-04-01 MED ORDER — ONDANSETRON HCL 4 MG/2ML IJ SOLN: 4.0000 mg | Freq: Four times a day (QID) | INTRAMUSCULAR | Status: DC | PRN

## 2015-04-01 MED ORDER — SODIUM CHLORIDE 0.9 % IJ SOLN
3.0000 mL | Freq: Two times a day (BID) | INTRAMUSCULAR | Status: DC
  Administered 2015-04-01 – 2015-04-07 (×12): 3 mL via INTRAVENOUS

## 2015-04-01 MED ORDER — INFLUENZA VAC SPLIT QUAD 0.5 ML IM SUSY
0.5000 mL | PREFILLED_SYRINGE | INTRAMUSCULAR | Status: AC
  Administered 2015-04-02: 0.5 mL via INTRAMUSCULAR
  Filled 2015-04-01: qty 0.5

## 2015-04-01 NOTE — Patient Outreach (Signed)
[facility]) Care Management   04/01/2015  [name] [birth year] [id number]  [name] is an 64 y.o. female  Subjective:   Objective:   Review of Systems  HENT: Negative.   Eyes: Negative.   Respiratory: Positive for shortness of breath.   Cardiovascular: Positive for leg swelling.  Gastrointestinal: Positive for nausea.       Has not been taking Prilosec, report this makes her feel better when taking other medications  Genitourinary: Negative.   Musculoskeletal: Negative.   Skin: Negative.   Neurological: Positive for weakness.  Endo/Heme/Allergies: Negative.   Psychiatric/Behavioral: Negative.     Physical Exam  Constitutional: She is oriented to person, place, and time. She appears well-developed and well-nourished.  Neck: Normal range of motion.  Cardiovascular: Normal rate, regular rhythm and normal heart sounds.   Respiratory: Effort normal and breath sounds normal.  GI: Soft. Bowel sounds are normal.  Swelling  Musculoskeletal: Normal range of motion.  Neurological: She is alert and oriented to person, place, and time.  Skin: Skin is warm and dry.   BP 166/100 mmHg  Pulse 91  Resp 20  Wt 158 lb (71.668 kg)  SpO2 98%  Current Medications:   Current Outpatient Prescriptions  Medication Sig Dispense Refill  . acetaminophen (TYLENOL) 500 MG tablet Take 500 mg by mouth every 6 (six) hours as needed (pain).     Marland Kitchen allopurinol (ZYLOPRIM) 100 MG tablet Take 1 tablet (100 mg total) by mouth daily.    Marland Kitchen amLODipine (NORVASC) 5 MG tablet Take 1 tablet (5 mg total) by mouth daily. 30 tablet 1  . aspirin EC 81 MG EC tablet Take 1 tablet (81 mg total) by mouth daily.    . carvedilol (COREG) 3.125 MG tablet Take 1 tablet (3.125 mg total) by mouth 2 (two) times daily with a meal. 60 tablet 6  . furosemide (LASIX) 80 MG tablet Take 1 tablet (80 mg total) by mouth 2 (two) times daily.    . isosorbide-hydrALAZINE (BIDIL) 20-37.5 MG per tablet Take 1  tablet by mouth 2 (two) times daily. 60 tablet 3  . linagliptin (TRADJENTA) 5 MG TABS tablet Take 1 tablet (5 mg total) by mouth daily. 30 tablet 1  . metolazone (ZAROXOLYN) 5 MG tablet Take 5 mg by mouth daily.    . nitroGLYCERIN (NITROSTAT) 0.4 MG SL tablet Place 0.4 mg under the tongue every 5 (five) minutes as needed for chest pain.    . potassium chloride (K-DUR,KLOR-CON) 10 MEQ tablet Take 10 mEq by mouth 2 (two) times daily.    Marland Kitchen spironolactone (ALDACTONE) 25 MG tablet Take 25 mg by mouth daily.    Marland Kitchen omeprazole (PRILOSEC) 20 MG capsule Take 1 capsule (20 mg total) by mouth daily. (Patient not taking: Reported on 02/26/2015) 30 capsule 1   No current facility-administered medications for this visit.    Functional Status:   In your present state of health, do you have any difficulty performing the following activities: 02/26/2015 02/01/2015  Hearing? N N  Vision? Y N  Difficulty concentrating or making decisions? N N  Walking or climbing stairs? Y Y  Dressing or bathing? N N  Doing errands, shopping? N N  Preparing Food and eating ? N -  Using the Toilet? N -  In the past six months, have you accidently leaked urine? N -  Do you have problems with loss of bowel control? N -  Managing your Medications? N -  Managing your Finances?  N -  Housekeeping or managing your Housekeeping? Y -    Fall/Depression Screening:    PHQ 2/9 Scores 02/26/2015  PHQ - 2 Score 1    Assessment:    Immediately upon this care manager's entrance into the home, the member states that she is not feeling well.  She states that she has been having problems with her stomach, and having bouts of nausea.  She states that she has not been taking her medications for this reason.  She reports that her weight yesterday was 158 pounds, which is a 19 pound weight gain since the home visit last month.  Her blood pressure is also elevated.  This care manager places call to Dr. Roseanne Kaufman office to inform the nurse of these  findings, member is advised to come to the office for a direct admit to the hospital.  Daughter called and will be able to take member to the provider's office.  Member and husband educated on the importance of taking medications as instructed as well as monitoring weights and blood pressure.  Member states that she has not been seen by her primary or the emergency department because she is receiving bills from her last visit and does not want to incur any further debt.  Member advised against postponing medical treatment because of financial concerns.  Member is still waiting to be approved by Medicaid.  She verbalizes understanding.  There are no further needs at this time as the member's daughter is transporting her to Dr. Roseanne Kaufman office.  Plan:   Will notify hospital liaison of member's pending admission.  [name], BSN, [facility] Care Management  [facility] Care Manager [phone]

## 2015-04-01 NOTE — H&P (Signed)
Referring Physician:  [name].                       Chief Complaint: Leg edema and exertional shortness of breath  HPI: 64 year old female presents with leg edema and progressive worsening of shortness of breath and 15 pound weight gain x 2-3 weeks has past medical history of acute biventricular systolic heart failure, CAD, status post stent in LAD, diabetes mellitus type 2, hypertension and anxiety. No fever or chest pain. Has some medications and dietary non-compliance off and on. LV EF 30 % by echo in March 2016 along with severe MR and TR.  Past Medical History  Diagnosis Date  . Hypertension   . Coronary artery disease   . CHF (congestive heart failure)   . Shortness of breath dyspnea   . High cholesterol   . Type II diabetes mellitus   . GERD (gastroesophageal reflux disease)   . Gout       Past Surgical History  Procedure Laterality Date  . Left and right heart catheterization with coronary angiogram N/A 11/25/2011    Procedure: LEFT AND RIGHT HEART CATHETERIZATION WITH CORONARY ANGIOGRAM;  Surgeon: Robynn Pane, MD;  Location: [facility] CATH LAB;  Service: Cardiovascular;  Laterality: N/A;  . Percutaneous coronary stent intervention (pci-s) Right 11/25/2011    Procedure: PERCUTANEOUS CORONARY STENT INTERVENTION (PCI-S);  Surgeon: Robynn Pane, MD;  Location: [facility] CATH LAB;  Service: Cardiovascular;  Laterality: Right;  . Left and right heart catheterization with coronary angiogram N/A 09/07/2012    Procedure: LEFT AND RIGHT HEART CATHETERIZATION WITH CORONARY ANGIOGRAM;  Surgeon: [name], MD;  Location: [facility] CATH LAB;  Service: Cardiovascular;  Laterality: N/A;  . Tonsillectomy  1950's  . Coronary angioplasty with stent placement  2013  . Cardiac catheterization  2014    No family history on file. Social History:  reports that she has quit smoking. Her smoking use included Cigarettes. She has a 10 pack-year smoking history. She has never used  smokeless tobacco. She reports that she does not drink alcohol or use illicit drugs.  Allergies: No Known Allergies  Medications Prior to Admission  Medication Sig Dispense Refill  . acetaminophen (TYLENOL) 500 MG tablet Take 500 mg by mouth every 6 (six) hours as needed (pain).     Marland Kitchen allopurinol (ZYLOPRIM) 100 MG tablet Take 1 tablet (100 mg total) by mouth daily.    Marland Kitchen amLODipine (NORVASC) 5 MG tablet Take 1 tablet (5 mg total) by mouth daily. 30 tablet 1  . aspirin EC 81 MG EC tablet Take 1 tablet (81 mg total) by mouth daily.    . carvedilol (COREG) 3.125 MG tablet Take 1 tablet (3.125 mg total) by mouth 2 (two) times daily with a meal. 60 tablet 6  . furosemide (LASIX) 80 MG tablet Take 1 tablet (80 mg total) by mouth 2 (two) times daily.    . isosorbide-hydrALAZINE (BIDIL) 20-37.5 MG per tablet Take 1 tablet by mouth 2 (two) times daily. 60 tablet 3  . linagliptin (TRADJENTA) 5 MG TABS tablet Take 1 tablet (5 mg total) by mouth daily. 30 tablet 1  . metolazone (ZAROXOLYN) 5 MG tablet Take 5 mg by mouth daily.    . nitroGLYCERIN (NITROSTAT) 0.4 MG SL tablet Place 0.4 mg under the tongue every 5 (five) minutes as needed for chest pain.    Marland Kitchen omeprazole (PRILOSEC) 20 MG capsule Take 1 capsule (20  mg total) by mouth daily. (Patient not taking: Reported on 02/26/2015) 30 capsule 1  . potassium chloride (K-DUR,KLOR-CON) 10 MEQ tablet Take 10 mEq by mouth 2 (two) times daily.    Marland Kitchen spironolactone (ALDACTONE) 25 MG tablet Take 25 mg by mouth daily.      No results found for this or any previous visit (from the past 48 hour(s)). No results found.  Review Of Systems + wears glasses, + weight gain + partial dentures, + COPD, + angina, + leg edema, + hypertension, + DM, II, + CHF, No CVA, No seizures, No kidney stone, + arthritis.  Blood pressure 190/104, pulse 97, temperature 98.1 F (36.7 C), temperature source Oral, resp. rate 16, height  (1.626 m), weight 71.169 kg (156 lb 14.4 oz), SpO2  100 %. General: Averagely built and nourished in mild respiratory distress. HEENT: [place]/AT, brown eyes, PERL, EOMI. conjuntiva-pink, sclera-white. Neck: + JVD, no bruit, no thyromegaly.  Lungs: Bilateral basal crackles.  Heart: Normal S1 and S2. Grade III/VI systolic murmur LSB  Abdomen: Mild swelling, non-tender.  Ext: 2 + edema up to thighs (and pre-sacral), bilaterally.  CNS: Cranial nerves grossly intact. Moves all 4 extremities. Skin: Warm and dry.  Assessment/Plan Acute Biventricular systolic heart failure  CAD  S/P Stent in LAD  DM, II  Hypertension  Anxiety  KADAKIA,AJAY S, MD  04/01/2015, 2:00 PM

## 2015-04-02 ENCOUNTER — Other Ambulatory Visit: Payer: Self-pay | Admitting: *Deleted

## 2015-04-02 ENCOUNTER — Inpatient Hospital Stay (HOSPITAL_COMMUNITY): Payer: 59

## 2015-04-02 LAB — GLUCOSE, CAPILLARY
GLUCOSE-CAPILLARY: 165 mg/dL — AB (ref 65–99)
Glucose-Capillary: 115 mg/dL — ABNORMAL HIGH (ref 65–99)
Glucose-Capillary: 204 mg/dL — ABNORMAL HIGH (ref 65–99)
Glucose-Capillary: 97 mg/dL (ref 65–99)

## 2015-04-02 LAB — BASIC METABOLIC PANEL
Anion gap: 7 (ref 5–15)
BUN: 47 mg/dL — AB (ref 6–20)
CHLORIDE: 105 mmol/L (ref 101–111)
CO2: 25 mmol/L (ref 22–32)
CREATININE: 1.57 mg/dL — AB (ref 0.44–1.00)
Calcium: 9.1 mg/dL (ref 8.9–10.3)
GFR calc Af Amer: 39 mL/min — ABNORMAL LOW (ref >60)
GFR calc non Af Amer: 34 mL/min — ABNORMAL LOW (ref >60)
GLUCOSE: 138 mg/dL — AB (ref 65–99)
POTASSIUM: 4.2 mmol/L (ref 3.5–5.1)
SODIUM: 137 mmol/L (ref 135–145)

## 2015-04-02 LAB — TROPONIN I

## 2015-04-02 MED ORDER — LORATADINE 10 MG PO TABS
10.0000 mg | ORAL_TABLET | Freq: Every day | ORAL | Status: DC
  Administered 2015-04-02 – 2015-04-07 (×6): 10 mg via ORAL
  Filled 2015-04-02 (×6): qty 1

## 2015-04-02 NOTE — Evaluation (Signed)
Physical Therapy Evaluation Patient Details Name: [name] MRN: [id number] DOB: Jan 22, 1951 Today's Date: 04/02/2015   History of Present Illness  64 year old female presents with leg edema and progressive worsening of shortness of breath and 15 pound weight gain x 2-3 weeks has past medical history of acute biventricular systolic heart failure, CAD, status post stent in LAD, diabetes mellitus type 2, hypertension and anxiety. No fever or chest pain. Has some medications and dietary non-compliance off and on. LV EF 30 % by echo in March 2016 along with severe MR and TR.  Clinical Impression  Pt admitted with/for progressive SOB due to Bi ventricular HF.  Pt currently limited functionally due to the problems listed below.  (see problems list.)  Pt will benefit from PT to maximize function and safety to be able to get home safely with available assist of family.     Follow Up Recommendations No PT follow up    Equipment Recommendations  None recommended by PT    Recommendations for Other Services       Precautions / Restrictions Precautions Precautions: None      Mobility  Bed Mobility                  Transfers Overall transfer level: Modified independent                  Ambulation/Gait Ambulation/Gait assistance: Supervision Ambulation Distance (Feet): 190 Feet Assistive device: None Gait Pattern/deviations: Step-through pattern Gait velocity: slower   General Gait Details: slow, but steady.  Pt could scan and interact with staff and others without LOB  Stairs            Wheelchair Mobility    Modified Rankin (Stroke Patients Only)       Balance Overall balance assessment: Needs assistance Sitting-balance support: No upper extremity supported Sitting balance-Leahy Scale: Good       Standing balance-Leahy Scale: Fair                               Pertinent Vitals/Pain Pain Assessment: No/denies pain    Home Living  Family/patient expects to be discharged to:: Private residence Living Arrangements: Spouse/significant other Available Help at Discharge: Family;Available 24 hours/day Type of Home: House Home Access: Level entry     Home Layout: One level Home Equipment: None      Prior Function Level of Independence: Independent               Hand Dominance   Dominant Hand: Right    Extremity/Trunk Assessment   Upper Extremity Assessment: Defer to OT evaluation           Lower Extremity Assessment: Overall WFL for tasks assessed (mild proximal and truncal weakness)         Communication   Communication: No difficulties  Cognition Arousal/Alertness: Awake/alert Behavior During Therapy: WFL for tasks assessed/performed Overall Cognitive Status: Within Functional Limits for tasks assessed                      General Comments General comments (skin integrity, edema, etc.): sats on RA 100%, EHR was 63 bpm    Exercises        Assessment/Plan    PT Assessment Patient needs continued PT services  PT Diagnosis Other (comment);Difficulty walking (decreased activity tolerance)   PT Problem List Decreased strength;Decreased activity tolerance;Decreased mobility;Cardiopulmonary status limiting activity  PT Treatment  Interventions Gait training;Stair training;Functional mobility training;Therapeutic activities;Patient/family education   PT Goals (Current goals can be found in the Care Plan section) Acute Rehab PT Goals Patient Stated Goal: back home feeling better PT Goal Formulation: With patient Time For Goal Achievement: 04/09/15 Potential to Achieve Goals: Good Additional Goals Additional Goal #1: DGI of  >20 to support lower risk of falls    Frequency Min 3X/week   Barriers to discharge        Co-evaluation               End of Session   Activity Tolerance: Patient tolerated treatment well Patient left: in chair;with call bell/phone within  reach;with family/visitor present Nurse Communication: Mobility status         Time: 1701-1715 PT Time Calculation (min) (ACUTE ONLY): 14 min   Charges:   PT Evaluation $Initial PT Evaluation Tier I: 1 Procedure     PT G Codes:        Mottinger, Eliseo Gum 04/02/2015, 5:26 PM 04/02/2015  Easton Bing, PT [phone]  (pager)

## 2015-04-02 NOTE — Progress Notes (Signed)
Utilization review completed. Bertha Stanfill, RN, BSN. 

## 2015-04-02 NOTE — Patient Outreach (Signed)
[facility]) Care Management  04/02/2015  [name] 06-Jun-1951 [id number]   Case closed as patient is Not eligible for [facility] Care Management Services.   Reason:  Not a beneficiary currently attributed to one of the [facility] ACO Registry populations.  Membership roster used to verify non- eligible status.  Thanks, [name]. [name] [facility] Care Management [facility] CM Assistant Phone: [phone] Fax: [phone]

## 2015-04-02 NOTE — Progress Notes (Signed)
Ref: Tanya Rodriguez, MD   Subjective:  Feeling better. Some chronic itching for few weeks.  Objective:  Vital Signs in the last 24 hours: Temp:  [97.6 F (36.4 C)-98.2 F (36.8 C)] 97.6 F (36.4 C) (09/22 1622) Pulse Rate:  [67-88] 67 (09/22 1622) Cardiac Rhythm:  [-] Normal sinus rhythm (09/22 0702) Resp:  [18] 18 (09/22 1622) BP: (123-163)/(64-90) 128/77 mmHg (09/22 1622) SpO2:  [98 %-100 %] 100 % (09/22 1622) Weight:  [71.759 kg (158 lb 3.2 oz)] 71.759 kg (158 lb 3.2 oz) (09/22 0415)  Physical Exam: BP Readings from Last 1 Encounters:  04/02/15 128/77    Wt Readings from Last 1 Encounters:  04/02/15 71.759 kg (158 lb 3.2 oz)    Weight change:   HEENT: [place]/AT, Eyes-Brown, PERL, EOMI, Conjunctiva-Pink, Sclera-Non-icteric Neck: + JVD, No bruit, Trachea midline. Lungs:  Clearing, Bilateral. Cardiac:  Regular rhythm, normal S1 and S2, no S3. III/VI systolic murmur. Abdomen:  Soft, non-tender. Extremities:  2 + edema present. No cyanosis. No clubbing. CNS: AxOx3, Cranial nerves grossly intact, moves all 4 extremities. Right handed. Skin: Warm and dry.   Intake/Output from previous day: 09/21 0701 - 09/22 0700 In: 600 [P.O.:600] Out: -     Lab Results: BMET    Component Value Date/Time   NA 137 04/02/2015 0350   NA 135 04/01/2015 1630   NA 136 02/06/2015 0345   K 4.2 04/02/2015 0350   K 4.1 04/01/2015 1630   K 3.7 02/06/2015 0345   CL 105 04/02/2015 0350   CL 102 04/01/2015 1630   CL 95* 02/06/2015 0345   CO2 25 04/02/2015 0350   CO2 24 04/01/2015 1630   CO2 30 02/06/2015 0345   GLUCOSE 138* 04/02/2015 0350   GLUCOSE 176* 04/01/2015 1630   GLUCOSE 151* 02/06/2015 0345   BUN 47* 04/02/2015 0350   BUN 43* 04/01/2015 1630   BUN 63* 02/06/2015 0345   CREATININE 1.57* 04/02/2015 0350   CREATININE 1.51* 04/01/2015 1630   CREATININE 2.01* 02/06/2015 0345   CALCIUM 9.1 04/02/2015 0350   CALCIUM 8.7* 04/01/2015 1630   CALCIUM 8.9 02/06/2015 0345   GFRNONAA 34*  04/02/2015 0350   GFRNONAA 36* 04/01/2015 1630   GFRNONAA 25* 02/06/2015 0345   GFRAA 39* 04/02/2015 0350   GFRAA 41* 04/01/2015 1630   GFRAA 29* 02/06/2015 0345   CBC    Component Value Date/Time   WBC 4.5 04/01/2015 1630   RBC 5.07 04/01/2015 1630   HGB 13.3 04/01/2015 1630   HCT 41.4 04/01/2015 1630   PLT 196 04/01/2015 1630   MCV 81.7 04/01/2015 1630   MCH 26.2 04/01/2015 1630   MCHC 32.1 04/01/2015 1630   RDW 19.4* 04/01/2015 1630   LYMPHSABS 1.4 04/01/2015 1630   MONOABS 0.4 04/01/2015 1630   EOSABS 0.0 04/01/2015 1630   BASOSABS 0.0 04/01/2015 1630   HEPATIC Function Panel  Recent Labs  12/07/14 0530 02/01/15 1020 04/01/15 1630  PROT 6.9 6.6 6.5   HEMOGLOBIN A1C No components found for: HGA1C,  MPG CARDIAC ENZYMES Lab Results  Component Value Date   CKTOTAL 59 09/07/2012   CKMB 3.2 09/07/2012   TROPONINI <0.03 04/02/2015   TROPONINI <0.03 04/01/2015   TROPONINI 0.04* 04/01/2015   BNP  Recent Labs  04/09/14 0451 06/19/14 1420  PROBNP 8206.0* 14027.0*   TSH  Recent Labs  02/01/15 1020  TSH 2.961   CHOLESTEROL No results for input(s): CHOL in the last 8760 hours.  Scheduled Meds: . allopurinol  100 mg  Oral BID  . amLODipine  5 mg Oral Daily  . aspirin EC  81 mg Oral Daily  . carvedilol  3.125 mg Oral BID WC  . furosemide  40 mg Intravenous Q12H  . heparin  5,000 Units Subcutaneous 3 times per day  . insulin aspart  0-9 Units Subcutaneous TID WC  . linagliptin  5 mg Oral Daily  . metolazone  5 mg Oral Daily  . potassium chloride  10 mEq Oral BID  . sodium chloride  3 mL Intravenous Q12H  . 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    LOS: 1 day    [name]   MD  04/02/2015, 7:45 PM

## 2015-04-03 LAB — BASIC METABOLIC PANEL
ANION GAP: 13 (ref 5–15)
BUN: 52 mg/dL — ABNORMAL HIGH (ref 6–20)
CHLORIDE: 103 mmol/L (ref 101–111)
CO2: 21 mmol/L — ABNORMAL LOW (ref 22–32)
CREATININE: 1.79 mg/dL — AB (ref 0.44–1.00)
Calcium: 8.8 mg/dL — ABNORMAL LOW (ref 8.9–10.3)
GFR calc non Af Amer: 29 mL/min — ABNORMAL LOW (ref >60)
GFR, EST AFRICAN AMERICAN: 34 mL/min — AB (ref >60)
Glucose, Bld: 127 mg/dL — ABNORMAL HIGH (ref 65–99)
Potassium: 4.7 mmol/L (ref 3.5–5.1)
SODIUM: 137 mmol/L (ref 135–145)

## 2015-04-03 LAB — GLUCOSE, CAPILLARY
GLUCOSE-CAPILLARY: 137 mg/dL — AB (ref 65–99)
GLUCOSE-CAPILLARY: 158 mg/dL — AB (ref 65–99)
Glucose-Capillary: 114 mg/dL — ABNORMAL HIGH (ref 65–99)
Glucose-Capillary: 170 mg/dL — ABNORMAL HIGH (ref 65–99)

## 2015-04-03 NOTE — Progress Notes (Signed)
OT Cancellation Note and Discharge  Patient Details Name: [name] MRN: [id number] DOB: April 29, 1951   Cancelled Treatment:    Reason Eval/Treat Not Completed: OT screened, no needs identified, will sign off. In to speak to pt about OT and our role. Pt reports not issues with BADLs nor ambulation ("I walked earlier with staff). Pt S yesterday for ambulation with PT and has 24 hour care at home per PT note.  Evette Georges [phone] 04/03/2015, 2:13 PM

## 2015-04-03 NOTE — Progress Notes (Signed)
Pharmacist Heart Failure Core Measure Documentation  Assessment: [name] has an EF documented as 30-35 on ECHO by 3/16.  Rationale: Heart failure patients with left ventricular systolic dysfunction (LVSD) and an EF < 40% should be prescribed an angiotensin converting enzyme inhibitor (ACEI) or angiotensin receptor blocker (ARB) at discharge unless a contraindication is documented in the medical record.  This patient is not currently on an ACEI or ARB for HF.  This note is being placed in the record in order to provide documentation that a contraindication to the use of these agents is present for this encounter.  ACE Inhibitor or Angiotensin Receptor Blocker is contraindicated (specify all that apply)    ACEI allergy AND ARB allergy   Angioedema   Moderate or severe aortic stenosis   Hyperkalemia   Hypotension   Renal artery stenosis   Worsening renal function, preexisting renal disease or dysfunction   Clide Cliff 04/03/2015 3:27 PM

## 2015-04-03 NOTE — Consult Note (Addendum)
   [facility] Inpatient Consult   04/03/2015  [name] 1951/06/24 [id number] Patient evaluated for [facility] Care Management services.  Patient is not eligible for [facility] Care Management services because his/her PCP is not a [facility] primary care provider or is not [facility] affiliated.  Patient endorses that [name] is her primary care provider.  After researching her insurance benefits it was found that she is not in network with a [facility] with Medicare plan, patient has commercial plan [employer] noted.  Patient was encouraged to call her insurance customer service and inquire about their care management programs for her chronic disease needs.  She verbalized appreciation for the assistance she had previously with [facility] Care Management with her medication issues.  This patient is Not eligible for [facility] Care Management Services.   Reason:  Not a beneficiary currently attributed to one of the [facility] ACO Registry populations.  Membership roster was used to verify non- eligible status. Charlesetta Shanks, RN BSN CCM [facility]  [phone] business mobile phone  Received a call from [name] [facility] from [phone], she said she would follow up with the [employer] program that they would reach out to the patient. No further information provided.

## 2015-04-04 ENCOUNTER — Other Ambulatory Visit: Payer: Self-pay | Admitting: *Deleted

## 2015-04-04 ENCOUNTER — Encounter: Payer: Self-pay | Admitting: *Deleted

## 2015-04-04 LAB — BASIC METABOLIC PANEL
ANION GAP: 10 (ref 5–15)
BUN: 55 mg/dL — ABNORMAL HIGH (ref 6–20)
CHLORIDE: 99 mmol/L — AB (ref 101–111)
CO2: 27 mmol/L (ref 22–32)
Calcium: 9.2 mg/dL (ref 8.9–10.3)
Creatinine, Ser: 1.95 mg/dL — ABNORMAL HIGH (ref 0.44–1.00)
GFR calc Af Amer: 30 mL/min — ABNORMAL LOW (ref >60)
GFR, EST NON AFRICAN AMERICAN: 26 mL/min — AB (ref >60)
GLUCOSE: 147 mg/dL — AB (ref 65–99)
POTASSIUM: 4.3 mmol/L (ref 3.5–5.1)
Sodium: 136 mmol/L (ref 135–145)

## 2015-04-04 LAB — GLUCOSE, CAPILLARY
GLUCOSE-CAPILLARY: 168 mg/dL — AB (ref 65–99)
GLUCOSE-CAPILLARY: 145 mg/dL — AB (ref 65–99)
Glucose-Capillary: 123 mg/dL — ABNORMAL HIGH (ref 65–99)
Glucose-Capillary: 163 mg/dL — ABNORMAL HIGH (ref 65–99)

## 2015-04-04 MED ORDER — FUROSEMIDE 10 MG/ML IJ SOLN
40.0000 mg | Freq: Two times a day (BID) | INTRAMUSCULAR | Status: DC
  Administered 2015-04-04 – 2015-04-05 (×2): 40 mg via INTRAVENOUS
  Filled 2015-04-04 (×2): qty 4

## 2015-04-04 MED ORDER — DOPAMINE-DEXTROSE 3.2-5 MG/ML-% IV SOLN
5.0000 ug/kg/min | INTRAVENOUS | Status: DC
  Administered 2015-04-04: 5 ug/kg/min via INTRAVENOUS
  Filled 2015-04-04: qty 250

## 2015-04-04 NOTE — Progress Notes (Signed)
Ref: Tanya Rodriguez, MD   Subjective:  Improving diuresis. Afebrile.  Objective:  Vital Signs in the last 24 hours: Temp:  [97.9 F (36.6 C)-98 F (36.7 C)] 98 F (36.7 C) (09/24 0405) Pulse Rate:  [62-71] 62 (09/24 0405) Cardiac Rhythm:  [-] Normal sinus rhythm (09/23 1900) Resp:  [18] 18 (09/24 0405) BP: (139-146)/(73-82) 139/73 mmHg (09/24 0405) SpO2:  [99 %] 99 % (09/24 0405) Weight:  [68.811 kg (151 lb 11.2 oz)] 68.811 kg (151 lb 11.2 oz) (09/24 0405)  Physical Exam: BP Readings from Last 1 Encounters:  04/04/15 139/73    Wt Readings from Last 1 Encounters:  04/04/15 68.811 kg (151 lb 11.2 oz)    Weight change: -1.724 kg (-3 lb 12.8 oz)  HEENT: [place]/AT, Eyes-Brown, PERL, EOMI, Conjunctiva-Pink, Sclera-Non-icteric Neck: + JVD, No bruit, Trachea midline. Lungs:  Clear, Bilateral. Cardiac:  Regular rhythm, normal S1 and S2, no S3. III/VI systolic murmur Abdomen:  Soft, non-tender. Extremities:  2 + edema present. No cyanosis. No clubbing. CNS: AxOx3, Cranial nerves grossly intact, moves all 4 extremities. Right handed. Skin: Warm and dry.   Intake/Output from previous day: 09/23 0701 - 09/24 0700 In: 1130 [P.O.:1130] Out: -     Lab Results: BMET    Component Value Date/Time   NA 136 04/04/2015 0420   NA 137 04/03/2015 0340   NA 137 04/02/2015 0350   K 4.3 04/04/2015 0420   K 4.7 04/03/2015 0340   K 4.2 04/02/2015 0350   CL 99* 04/04/2015 0420   CL 103 04/03/2015 0340   CL 105 04/02/2015 0350   CO2 27 04/04/2015 0420   CO2 21* 04/03/2015 0340   CO2 25 04/02/2015 0350   GLUCOSE 147* 04/04/2015 0420   GLUCOSE 127* 04/03/2015 0340   GLUCOSE 138* 04/02/2015 0350   BUN 55* 04/04/2015 0420   BUN 52* 04/03/2015 0340   BUN 47* 04/02/2015 0350   CREATININE 1.95* 04/04/2015 0420   CREATININE 1.79* 04/03/2015 0340   CREATININE 1.57* 04/02/2015 0350   CALCIUM 9.2 04/04/2015 0420   CALCIUM 8.8* 04/03/2015 0340   CALCIUM 9.1 04/02/2015 0350   GFRNONAA 26*  04/04/2015 0420   GFRNONAA 29* 04/03/2015 0340   GFRNONAA 34* 04/02/2015 0350   GFRAA 30* 04/04/2015 0420   GFRAA 34* 04/03/2015 0340   GFRAA 39* 04/02/2015 0350   CBC    Component Value Date/Time   WBC 4.5 04/01/2015 1630   RBC 5.07 04/01/2015 1630   HGB 13.3 04/01/2015 1630   HCT 41.4 04/01/2015 1630   PLT 196 04/01/2015 1630   MCV 81.7 04/01/2015 1630   MCH 26.2 04/01/2015 1630   MCHC 32.1 04/01/2015 1630   RDW 19.4* 04/01/2015 1630   LYMPHSABS 1.4 04/01/2015 1630   MONOABS 0.4 04/01/2015 1630   EOSABS 0.0 04/01/2015 1630   BASOSABS 0.0 04/01/2015 1630   HEPATIC Function Panel  Recent Labs  12/07/14 0530 02/01/15 1020 04/01/15 1630  PROT 6.9 6.6 6.5   HEMOGLOBIN A1C No components found for: HGA1C,  MPG CARDIAC ENZYMES Lab Results  Component Value Date   CKTOTAL 59 09/07/2012   CKMB 3.2 09/07/2012   TROPONINI <0.03 04/02/2015   TROPONINI <0.03 04/01/2015   TROPONINI 0.04* 04/01/2015   BNP  Recent Labs  04/09/14 0451 06/19/14 1420  PROBNP 8206.0* 14027.0*   TSH  Recent Labs  02/01/15 1020  TSH 2.961   CHOLESTEROL No results for input(s): CHOL in the last 8760 hours.  Scheduled Meds: . allopurinol  100 mg Oral  BID  . amLODipine  5 mg Oral Daily  . aspirin EC  81 mg Oral Daily  . carvedilol  3.125 mg Oral BID WC  . furosemide  40 mg Intravenous Q12H  . heparin  5,000 Units Subcutaneous 3 times per day  . insulin aspart  0-9 Units Subcutaneous TID WC  . linagliptin  5 mg Oral Daily  . loratadine  10 mg Oral Daily  . metolazone  5 mg Oral Daily  . sodium chloride  3 mL Intravenous Q12H  . 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   LOS: 2 days    Tanya Cobb  MD   04/04/2015, 9:10 AM

## 2015-04-04 NOTE — Progress Notes (Signed)
Ref: [name], MD   Subjective:  Breathing better. Decreasing leg edema up to thighs.  Objective:  Vital Signs in the last 24 hours: Temp:  [97.9 F (36.6 C)-98 F (36.7 C)] 98 F (36.7 C) (09/24 0405) Pulse Rate:  [62-71] 62 (09/24 0405) Cardiac Rhythm:  [-] Normal sinus rhythm (09/23 1900) Resp:  [18] 18 (09/24 0405) BP: (139-146)/(73-82) 139/73 mmHg (09/24 0405) SpO2:  [99 %] 99 % (09/24 0405) Weight:  [68.811 kg (151 lb 11.2 oz)] 68.811 kg (151 lb 11.2 oz) (09/24 0405)  Physical Exam: BP Readings from Last 1 Encounters:  04/04/15 139/73    Wt Readings from Last 1 Encounters:  04/04/15 68.811 kg (151 lb 11.2 oz)    Weight change: -1.724 kg (-3 lb 12.8 oz)  HEENT: [place]/AT, Eyes-Brown, PERL, EOMI, Conjunctiva-Pink, Sclera-Non-icteric Neck: + JVD, No bruit, Trachea midline. Lungs:  Clear, Bilateral. Cardiac:  Regular rhythm, normal S1 and S2, no S3. III/VI systolic murmur.  Abdomen:  Soft, non-tender. Extremities:  1 + edema  up to thighs present. No cyanosis. No clubbing. CNS: AxOx3, Cranial nerves grossly intact, moves all 4 extremities. Right handed. Skin: Warm and dry.   Intake/Output from previous day: 09/23 0701 - 09/24 0700 In: 1130 [P.O.:1130] Out: -     Lab Results: BMET    Component Value Date/Time   NA 136 04/04/2015 0420   NA 137 04/03/2015 0340   NA 137 04/02/2015 0350   K 4.3 04/04/2015 0420   K 4.7 04/03/2015 0340   K 4.2 04/02/2015 0350   CL 99* 04/04/2015 0420   CL 103 04/03/2015 0340   CL 105 04/02/2015 0350   CO2 27 04/04/2015 0420   CO2 21* 04/03/2015 0340   CO2 25 04/02/2015 0350   GLUCOSE 147* 04/04/2015 0420   GLUCOSE 127* 04/03/2015 0340   GLUCOSE 138* 04/02/2015 0350   BUN 55* 04/04/2015 0420   BUN 52* 04/03/2015 0340   BUN 47* 04/02/2015 0350   CREATININE 1.95* 04/04/2015 0420   CREATININE 1.79* 04/03/2015 0340   CREATININE 1.57* 04/02/2015 0350   CALCIUM 9.2 04/04/2015 0420   CALCIUM 8.8* 04/03/2015 0340   CALCIUM 9.1  04/02/2015 0350   GFRNONAA 26* 04/04/2015 0420   GFRNONAA 29* 04/03/2015 0340   GFRNONAA 34* 04/02/2015 0350   GFRAA 30* 04/04/2015 0420   GFRAA 34* 04/03/2015 0340   GFRAA 39* 04/02/2015 0350   CBC    Component Value Date/Time   WBC 4.5 04/01/2015 1630   RBC 5.07 04/01/2015 1630   HGB 13.3 04/01/2015 1630   HCT 41.4 04/01/2015 1630   PLT 196 04/01/2015 1630   MCV 81.7 04/01/2015 1630   MCH 26.2 04/01/2015 1630   MCHC 32.1 04/01/2015 1630   RDW 19.4* 04/01/2015 1630   LYMPHSABS 1.4 04/01/2015 1630   MONOABS 0.4 04/01/2015 1630   EOSABS 0.0 04/01/2015 1630   BASOSABS 0.0 04/01/2015 1630   HEPATIC Function Panel  Recent Labs  12/07/14 0530 02/01/15 1020 04/01/15 1630  PROT 6.9 6.6 6.5   HEMOGLOBIN A1C No components found for: HGA1C,  MPG CARDIAC ENZYMES Lab Results  Component Value Date   CKTOTAL 59 09/07/2012   CKMB 3.2 09/07/2012   TROPONINI <0.03 04/02/2015   TROPONINI <0.03 04/01/2015   TROPONINI 0.04* 04/01/2015   BNP  Recent Labs  04/09/14 0451 06/19/14 1420  PROBNP 8206.0* 14027.0*   TSH  Recent Labs  02/01/15 1020  TSH 2.961   CHOLESTEROL No results for input(s): CHOL in the last 8760  hours.  Scheduled Meds: . allopurinol  100 mg Oral BID  . amLODipine  5 mg Oral Daily  . aspirin EC  81 mg Oral Daily  . carvedilol  3.125 mg Oral BID WC  . furosemide  40 mg Intravenous Q12H  . heparin  5,000 Units Subcutaneous 3 times per day  . insulin aspart  0-9 Units Subcutaneous TID WC  . linagliptin  5 mg Oral Daily  . loratadine  10 mg Oral Daily  . metolazone  5 mg Oral Daily  . sodium chloride  3 mL Intravenous Q12H  . spironolactone  25 mg Oral Daily   Continuous Infusions:  PRN Meds:.sodium chloride, acetaminophen, nitroGLYCERIN, ondansetron (ZOFRAN) IV, sodium chloride  Assessment/Plan: Acute Biventricular systolic heart failure  CAD  S/P Stent in LAD  DM, II  Hypertension  Anxiety CKD, III  Add Dopamine drip   LOS: 3  days    [name]  MD  04/04/2015, 9:12 AM

## 2015-04-04 NOTE — Patient Outreach (Signed)
Notified by care manager assistant the member is not eligible for services.  Will send case closure letter to physician.  Member already notified by hospital liaison of case closure.  [name], BSN, [facility] Care Management  [facility] Care Manager [phone]

## 2015-04-05 LAB — GLUCOSE, CAPILLARY
GLUCOSE-CAPILLARY: 99 mg/dL (ref 65–99)
GLUCOSE-CAPILLARY: 135 mg/dL — AB (ref 65–99)
Glucose-Capillary: 126 mg/dL — ABNORMAL HIGH (ref 65–99)
Glucose-Capillary: 206 mg/dL — ABNORMAL HIGH (ref 65–99)

## 2015-04-05 MED ORDER — FUROSEMIDE 40 MG PO TABS
40.0000 mg | ORAL_TABLET | Freq: Two times a day (BID) | ORAL | Status: DC
  Administered 2015-04-05 – 2015-04-07 (×4): 40 mg via ORAL
  Filled 2015-04-05 (×4): qty 1

## 2015-04-05 NOTE — Progress Notes (Signed)
Ref: [name], MD   Subjective:  Additional diuresis post dopamine drip.  Objective:  Vital Signs in the last 24 hours: Temp:  [97.8 F (36.6 C)-98 F (36.7 C)] 98 F (36.7 C) (09/25 0456) Pulse Rate:  [58-65] 58 (09/25 0456) Cardiac Rhythm:  [-] Normal sinus rhythm (09/25 0707) Resp:  [18-20] 18 (09/25 0456) BP: (125-143)/(58-69) 134/69 mmHg (09/25 0456) SpO2:  [98 %-100 %] 100 % (09/25 0456) Weight:  [63.549 kg (140 lb 1.6 oz)] 63.549 kg (140 lb 1.6 oz) (09/25 0456)  Physical Exam: BP Readings from Last 1 Encounters:  04/05/15 134/69    Wt Readings from Last 1 Encounters:  04/05/15 63.549 kg (140 lb 1.6 oz)    Weight change: -5.262 kg (-11 lb 9.6 oz)  HEENT: [place]/AT, Eyes-Brown, PERL, EOMI, Conjunctiva-Pink, Sclera-Non-icteric Neck: No JVD, No bruit, Trachea midline. Lungs:  Clear, Bilateral. Cardiac:  Regular rhythm, normal S1 and S2, no S3. III/VI systolic murmur. Abdomen:  Soft, non-tender. Extremities:  Trace edema present. No cyanosis. No clubbing. CNS: AxOx3, Cranial nerves grossly intact, moves all 4 extremities. Right handed. Skin: Warm and dry.   Intake/Output from previous day: 09/24 0701 - 09/25 0700 In: 917.4 [P.O.:789; I.V.:128.4] Out: 2700 [Urine:2700]    Lab Results: BMET    Component Value Date/Time   NA 136 04/04/2015 0420   NA 137 04/03/2015 0340   NA 137 04/02/2015 0350   K 4.3 04/04/2015 0420   K 4.7 04/03/2015 0340   K 4.2 04/02/2015 0350   CL 99* 04/04/2015 0420   CL 103 04/03/2015 0340   CL 105 04/02/2015 0350   CO2 27 04/04/2015 0420   CO2 21* 04/03/2015 0340   CO2 25 04/02/2015 0350   GLUCOSE 147* 04/04/2015 0420   GLUCOSE 127* 04/03/2015 0340   GLUCOSE 138* 04/02/2015 0350   BUN 55* 04/04/2015 0420   BUN 52* 04/03/2015 0340   BUN 47* 04/02/2015 0350   CREATININE 1.95* 04/04/2015 0420   CREATININE 1.79* 04/03/2015 0340   CREATININE 1.57* 04/02/2015 0350   CALCIUM 9.2 04/04/2015 0420   CALCIUM 8.8* 04/03/2015 0340    CALCIUM 9.1 04/02/2015 0350   GFRNONAA 26* 04/04/2015 0420   GFRNONAA 29* 04/03/2015 0340   GFRNONAA 34* 04/02/2015 0350   GFRAA 30* 04/04/2015 0420   GFRAA 34* 04/03/2015 0340   GFRAA 39* 04/02/2015 0350   CBC    Component Value Date/Time   WBC 4.5 04/01/2015 1630   RBC 5.07 04/01/2015 1630   HGB 13.3 04/01/2015 1630   HCT 41.4 04/01/2015 1630   PLT 196 04/01/2015 1630   MCV 81.7 04/01/2015 1630   MCH 26.2 04/01/2015 1630   MCHC 32.1 04/01/2015 1630   RDW 19.4* 04/01/2015 1630   LYMPHSABS 1.4 04/01/2015 1630   MONOABS 0.4 04/01/2015 1630   EOSABS 0.0 04/01/2015 1630   BASOSABS 0.0 04/01/2015 1630   HEPATIC Function Panel  Recent Labs  12/07/14 0530 02/01/15 1020 04/01/15 1630  PROT 6.9 6.6 6.5   HEMOGLOBIN A1C No components found for: HGA1C,  MPG CARDIAC ENZYMES Lab Results  Component Value Date   CKTOTAL 59 09/07/2012   CKMB 3.2 09/07/2012   TROPONINI <0.03 04/02/2015   TROPONINI <0.03 04/01/2015   TROPONINI 0.04* 04/01/2015   BNP  Recent Labs  04/09/14 0451 06/19/14 1420  PROBNP 8206.0* 14027.0*   TSH  Recent Labs  02/01/15 1020  TSH 2.961   CHOLESTEROL No results for input(s): CHOL in the last 8760 hours.  Scheduled Meds: . allopurinol  100 mg Oral BID  . amLODipine  5 mg Oral Daily  . aspirin EC  81 mg Oral Daily  . carvedilol  3.125 mg Oral BID WC  . furosemide  40 mg Oral BID  . heparin  5,000 Units Subcutaneous 3 times per day  . insulin aspart  0-9 Units Subcutaneous TID WC  . linagliptin  5 mg Oral Daily  . loratadine  10 mg Oral Daily  . metolazone  5 mg Oral Daily  . sodium chloride  3 mL Intravenous Q12H  . spironolactone  25 mg Oral Daily   Continuous Infusions: . DOPamine 5 mcg/kg/min (04/04/15 1015)   PRN Meds:.sodium chloride, acetaminophen, nitroGLYCERIN, ondansetron (ZOFRAN) IV, sodium chloride  Assessment/Plan: Acute Biventricular systolic heart failure  CAD  S/P Stent in LAD  DM, II  Hypertension   Anxiety CKD, III  Change IV lasix to PO. Increase activity.    LOS: 4 days    [name]  MD  04/05/2015, 10:50 AM

## 2015-04-06 LAB — BASIC METABOLIC PANEL
ANION GAP: 10 (ref 5–15)
BUN: 58 mg/dL — ABNORMAL HIGH (ref 6–20)
CALCIUM: 9.1 mg/dL (ref 8.9–10.3)
CO2: 29 mmol/L (ref 22–32)
Chloride: 96 mmol/L — ABNORMAL LOW (ref 101–111)
Creatinine, Ser: 2.15 mg/dL — ABNORMAL HIGH (ref 0.44–1.00)
GFR, EST AFRICAN AMERICAN: 27 mL/min — AB (ref >60)
GFR, EST NON AFRICAN AMERICAN: 23 mL/min — AB (ref >60)
Glucose, Bld: 136 mg/dL — ABNORMAL HIGH (ref 65–99)
POTASSIUM: 4 mmol/L (ref 3.5–5.1)
Sodium: 135 mmol/L (ref 135–145)

## 2015-04-06 LAB — GLUCOSE, CAPILLARY
GLUCOSE-CAPILLARY: 149 mg/dL — AB (ref 65–99)
GLUCOSE-CAPILLARY: 132 mg/dL — AB (ref 65–99)
Glucose-Capillary: 113 mg/dL — ABNORMAL HIGH (ref 65–99)
Glucose-Capillary: 136 mg/dL — ABNORMAL HIGH (ref 65–99)

## 2015-04-06 MED ORDER — CARVEDILOL 3.125 MG PO TABS
3.1250 mg | ORAL_TABLET | Freq: Two times a day (BID) | ORAL | Status: DC
  Administered 2015-04-07: 3.125 mg via ORAL
  Filled 2015-04-06: qty 1

## 2015-04-06 NOTE — Progress Notes (Signed)
Ref: [name], MD   Subjective:  Off dopamine drip and oral lasix now working. Lost about 15 pounds of weight in 6 days.  Objective:  Vital Signs in the last 24 hours: Temp:  [98.1 F (36.7 C)-98.3 F (36.8 C)] 98.2 F (36.8 C) (09/26 1204) Pulse Rate:  [67-70] 70 (09/26 1204) Cardiac Rhythm:  [-] Normal sinus rhythm (09/26 0700) Resp:  [17-18] 18 (09/26 1204) BP: (116-134)/(55-64) 134/64 mmHg (09/26 1204) SpO2:  [100 %] 100 % (09/26 1204) Weight:  [63.821 kg (140 lb 11.2 oz)] 63.821 kg (140 lb 11.2 oz) (09/26 0519)  Physical Exam: BP Readings from Last 1 Encounters:  04/06/15 134/64    Wt Readings from Last 1 Encounters:  04/06/15 63.821 kg (140 lb 11.2 oz)    Weight change: 0.272 kg (9.6 oz)  HEENT: [place]/AT, Eyes-Brown, PERL, EOMI, Conjunctiva-Pink, Sclera-Non-icteric Neck: No JVD, No bruit, Trachea midline. Lungs:  Clear, Bilateral. Cardiac:  Regular rhythm, normal S1 and S2, no S3. III/VI systolic murmur. Abdomen:  Soft, non-tender. Extremities:  Trace to 1 + edema present. No cyanosis. No clubbing. CNS: AxOx3, Cranial nerves grossly intact, moves all 4 extremities. Right handed. Skin: Warm and dry.   Intake/Output from previous day: 09/25 0701 - 09/26 0700 In: 392.5 [P.O.:360; I.V.:32.5] Out: 1950 [Urine:1950]    Lab Results: BMET    Component Value Date/Time   NA 135 04/06/2015 0316   NA 136 04/04/2015 0420   NA 137 04/03/2015 0340   K 4.0 04/06/2015 0316   K 4.3 04/04/2015 0420   K 4.7 04/03/2015 0340   CL 96* 04/06/2015 0316   CL 99* 04/04/2015 0420   CL 103 04/03/2015 0340   CO2 29 04/06/2015 0316   CO2 27 04/04/2015 0420   CO2 21* 04/03/2015 0340   GLUCOSE 136* 04/06/2015 0316   GLUCOSE 147* 04/04/2015 0420   GLUCOSE 127* 04/03/2015 0340   BUN 58* 04/06/2015 0316   BUN 55* 04/04/2015 0420   BUN 52* 04/03/2015 0340   CREATININE 2.15* 04/06/2015 0316   CREATININE 1.95* 04/04/2015 0420   CREATININE 1.79* 04/03/2015 0340   CALCIUM 9.1  04/06/2015 0316   CALCIUM 9.2 04/04/2015 0420   CALCIUM 8.8* 04/03/2015 0340   GFRNONAA 23* 04/06/2015 0316   GFRNONAA 26* 04/04/2015 0420   GFRNONAA 29* 04/03/2015 0340   GFRAA 27* 04/06/2015 0316   GFRAA 30* 04/04/2015 0420   GFRAA 34* 04/03/2015 0340   CBC    Component Value Date/Time   WBC 4.5 04/01/2015 1630   RBC 5.07 04/01/2015 1630   HGB 13.3 04/01/2015 1630   HCT 41.4 04/01/2015 1630   PLT 196 04/01/2015 1630   MCV 81.7 04/01/2015 1630   MCH 26.2 04/01/2015 1630   MCHC 32.1 04/01/2015 1630   RDW 19.4* 04/01/2015 1630   LYMPHSABS 1.4 04/01/2015 1630   MONOABS 0.4 04/01/2015 1630   EOSABS 0.0 04/01/2015 1630   BASOSABS 0.0 04/01/2015 1630   HEPATIC Function Panel  Recent Labs  12/07/14 0530 02/01/15 1020 04/01/15 1630  PROT 6.9 6.6 6.5   HEMOGLOBIN A1C No components found for: HGA1C,  MPG CARDIAC ENZYMES Lab Results  Component Value Date   CKTOTAL 59 09/07/2012   CKMB 3.2 09/07/2012   TROPONINI <0.03 04/02/2015   TROPONINI <0.03 04/01/2015   TROPONINI 0.04* 04/01/2015   BNP  Recent Labs  04/09/14 0451 06/19/14 1420  PROBNP 8206.0* 14027.0*   TSH  Recent Labs  02/01/15 1020  TSH 2.961   CHOLESTEROL No results for input(s):  CHOL in the last 8760 hours.  Scheduled Meds: . allopurinol  100 mg Oral BID  . amLODipine  5 mg Oral Daily  . aspirin EC  81 mg Oral Daily  . carvedilol  3.125 mg Oral BID WC  . furosemide  40 mg Oral BID  . heparin  5,000 Units Subcutaneous 3 times per day  . insulin aspart  0-9 Units Subcutaneous TID WC  . linagliptin  5 mg Oral Daily  . loratadine  10 mg Oral Daily  . metolazone  5 mg Oral Daily  . sodium chloride  3 mL Intravenous Q12H  . spironolactone  25 mg Oral Daily   Continuous Infusions:  PRN Meds:.sodium chloride, acetaminophen, nitroGLYCERIN, ondansetron (ZOFRAN) IV, sodium chloride  Assessment/Plan: Acute on chronic biventricular systolic heart failure  CAD  S/P Stent in LAD  DM, II   Hypertension  Anxiety CKD, III  Continue medical treatment. Home in AM.     LOS: 5 days    [name]  MD  04/06/2015, 7:20 PM

## 2015-04-06 NOTE — Patient Outreach (Signed)
[facility]) Care Management  04/06/2015  KYMBERLYN ECKFORD 1951/01/21 [id number]     Case closed as patient is Not eligible for [facility] Care Management Services.  Reason: Not a beneficiary currently attributed to one of the [facility] ACO Registry populations. Case closure discussed with RNCM.   Adriana Reams [facility] Care Management [phone]

## 2015-04-06 NOTE — Clinical Documentation Improvement (Signed)
Cardiology  Abnormal Lab/Test Results: BUN/Cr on admit 43/1.51 to  9/26 58/2.15  Possible Clinical Conditions associated with below indicators  AKI  Other Condition  Cannot Clinically Determine  Treatment Provided: -- Monitoring renal function  Please exercise your independent, professional judgment when responding. A specific answer is not anticipated or expected.   Thank You,  Beverley Fiedler RN [facility] Health Information Management [place] [phone]

## 2015-04-06 NOTE — Progress Notes (Signed)
PT Cancellation Note  Patient Details Name: [name] MRN: [id number] DOB: 09/11/1950   Cancelled Treatment:    Reason Eval/Treat Not Completed: Patient declined, no reason specified;Other (comment) Patient reports she is at baseline, has been walking around independently and has no needs for PT. She politely declined multiple times. Please reconsult if status changes, thank you.   Michele Rockers, SPT [phone] 04/06/2015, 11:41 AM   I have read, reviewed and agree with student's note.   [facility] Acute Rehabilitation [phone] (pager)

## 2015-04-07 LAB — GLUCOSE, CAPILLARY: GLUCOSE-CAPILLARY: 99 mg/dL (ref 65–99)

## 2015-04-07 MED ORDER — FUROSEMIDE 80 MG PO TABS
40.0000 mg | ORAL_TABLET | Freq: Two times a day (BID) | ORAL | Status: DC
Start: 2015-04-07 — End: 2015-05-29

## 2015-04-07 NOTE — Progress Notes (Signed)
Pt has orders to be discharged. Discharge instructions given and pt has no additional questions at this time. Medication regimen reviewed and pt educated. Pt verbalized understanding and has no additional questions. Telemetry box removed. IV removed and site in good condition. Pt stable and waiting for transportation.   Bethany Scherer RN 

## 2015-04-07 NOTE — Discharge Summary (Signed)
Physician Discharge Summary  Patient ID: [name] MRN: [id number] DOB/AGE: 1951-02-01 64 y.o.  Admit date: 04/01/2015 Discharge date: 04/07/2015  Admission Diagnoses: Biventricular systolic heart failure  CAD  S/P Stent in LAD  DM, II  Hypertension  Anxiety CKD, II  Discharge Diagnoses:  Principal Problem:   Acute on chronic biventricular heart failure, NYHA class 2 Active Problems:   Diabetes mellitus, II   Essential hypertension   CAD    S/P Stent in LAD    Anxiety   Acute on chronic renal failure  Discharged Condition: fair  Hospital Course: 64 year old female presents with leg edema and progressive worsening of shortness of breath and 15 pound weight gain x 2-3 weeks has past medical history of acute biventricular systolic heart failure, CAD, status post stent in LAD, diabetes mellitus type 2, hypertension and anxiety. She has some medications and dietary non-compliance off and on. LV EF was 30 % by echo in March 2016 along with severe MR and TR. She had slow and steady diuresis with 15 pound weight loss in 5-6 days. Her activity was increased. She was strongly advised to comply with diet, fluid intake, medications and activity to prevent recurrent admission and will be followed by me in 1 week.  Consults: cardiology  Significant Diagnostic Studies: labs: Elevated BNP of 2855, BUN/Cr of 43/1.51 slowly worsening to 58/2.15, glucose of 176. Near normal CBC. Near normal troponin-I x 3.  EKG-NSR, RAD, Left atrial enlargement.  Chest x-ray: Cardiomegaly with left base atelectasis.  Treatments: cardiac meds: aspirin, carvedilol, amlodipine, furosemide, potassium and spironolactone.  Discharge Exam: Blood pressure 136/63, pulse 68, temperature 98.3 F (36.8 C), temperature source Oral, resp. rate 20, height  (1.626 m), weight 62.687 kg (138 lb 3.2 oz), SpO2 99 %. HEENT: [place]/AT, Eyes-Brown, PERL, EOMI, Conjunctiva-Pink, Sclera-Non-icteric Neck: No JVD, No  bruit, Trachea midline. Lungs: Clear, Bilateral. Cardiac: Regular rhythm, normal S1 and S2, no S3. III/VI systolic murmur. Abdomen: Soft, non-tender. Extremities: Trace edema present. No cyanosis. No clubbing. CNS: AxOx3, Cranial nerves grossly intact, moves all 4 extremities. Right handed. Skin: Warm and dry.  Disposition: 01-Home or Self Care     Medication List    STOP taking these medications        isosorbide-hydrALAZINE 20-37.5 MG per tablet  Commonly known as:  BIDIL      TAKE these medications        allopurinol 100 MG tablet  Commonly known as:  ZYLOPRIM  Take 1 tablet (100 mg total) by mouth daily.     amLODipine 5 MG tablet  Commonly known as:  NORVASC  Take 1 tablet (5 mg total) by mouth daily.     aspirin 81 MG chewable tablet  Chew 81 mg by mouth daily.     carvedilol 3.125 MG tablet  Commonly known as:  COREG  Take 1 tablet (3.125 mg total) by mouth 2 (two) times daily with a meal.     furosemide 80 MG tablet  Commonly known as:  LASIX  Take 0.5 tablets (40 mg total) by mouth 2 (two) times daily.     linagliptin 5 MG Tabs tablet  Commonly known as:  TRADJENTA  Take 1 tablet (5 mg total) by mouth daily.     metolazone 5 MG tablet  Commonly known as:  ZAROXOLYN  Take 5 mg by mouth daily.     nitroGLYCERIN 0.4 MG SL tablet  Commonly known as:  NITROSTAT  Place 0.4 mg under the tongue  every 5 (five) minutes as needed for chest pain.     potassium chloride 10 MEQ tablet  Commonly known as:  K-DUR,KLOR-CON  Take 10 mEq by mouth 2 (two) times daily.     spironolactone 25 MG tablet  Commonly known as:  ALDACTONE  Take 25 mg by mouth daily.      ASK your doctor about these medications        omeprazole 20 MG capsule  Commonly known as:  PRILOSEC  Take 1 capsule (20 mg total) by mouth daily.           Follow-up Information    Follow up with [facility] S, MD. Schedule an appointment as soon as possible for a visit in 1 week.    Specialty:  Cardiology   Contact information:   [address] [place] [address] [phone]       Signed: [name] 04/07/2015, 9:15 AM

## 2015-04-15 ENCOUNTER — Other Ambulatory Visit: Payer: Self-pay | Admitting: Pharmacist

## 2015-04-15 NOTE — Patient Outreach (Signed)
Case closed as patient is Not eligible for [facility] Care Management Services.  Reason: Not a beneficiary currently attributed to one of the [facility] ACO Registry populations.    Juanita Craver, PharmD, BCPS Clinical Pharmacist [facility] [phone]

## 2015-05-25 ENCOUNTER — Encounter (HOSPITAL_COMMUNITY): Payer: Self-pay | Admitting: Cardiology

## 2015-05-25 ENCOUNTER — Inpatient Hospital Stay (HOSPITAL_COMMUNITY)
Admission: AD | Admit: 2015-05-25 | Discharge: 2015-05-29 | DRG: 291 | Disposition: A | Discharge status: Home / Self Care | Payer: 59 | Source: Ambulatory Visit | Attending: Cardiovascular Disease | Admitting: Cardiovascular Disease

## 2015-05-25 DIAGNOSIS — K219 Gastro-esophageal reflux disease without esophagitis: Secondary | ICD-10-CM | POA: Diagnosis present

## 2015-05-25 DIAGNOSIS — I5082 Biventricular heart failure: Secondary | ICD-10-CM

## 2015-05-25 DIAGNOSIS — N183 Chronic kidney disease, stage 3 (moderate): Secondary | ICD-10-CM | POA: Diagnosis present

## 2015-05-25 DIAGNOSIS — Z87891 Personal history of nicotine dependence: Secondary | ICD-10-CM

## 2015-05-25 DIAGNOSIS — F419 Anxiety disorder, unspecified: Secondary | ICD-10-CM | POA: Diagnosis present

## 2015-05-25 DIAGNOSIS — Z9111 Patient's noncompliance with dietary regimen: Secondary | ICD-10-CM | POA: Diagnosis not present

## 2015-05-25 DIAGNOSIS — I081 Rheumatic disorders of both mitral and tricuspid valves: Secondary | ICD-10-CM | POA: Diagnosis present

## 2015-05-25 DIAGNOSIS — Z7982 Long term (current) use of aspirin: Secondary | ICD-10-CM | POA: Diagnosis not present

## 2015-05-25 DIAGNOSIS — R609 Edema, unspecified: Secondary | ICD-10-CM | POA: Diagnosis present

## 2015-05-25 DIAGNOSIS — N179 Acute kidney failure, unspecified: Secondary | ICD-10-CM | POA: Diagnosis present

## 2015-05-25 DIAGNOSIS — I5023 Acute on chronic systolic (congestive) heart failure: Secondary | ICD-10-CM | POA: Diagnosis present

## 2015-05-25 DIAGNOSIS — E1129 Type 2 diabetes mellitus with other diabetic kidney complication: Secondary | ICD-10-CM

## 2015-05-25 DIAGNOSIS — Z955 Presence of coronary angioplasty implant and graft: Secondary | ICD-10-CM

## 2015-05-25 DIAGNOSIS — Z79899 Other long term (current) drug therapy: Secondary | ICD-10-CM | POA: Diagnosis not present

## 2015-05-25 DIAGNOSIS — I10 Essential (primary) hypertension: Secondary | ICD-10-CM | POA: Diagnosis present

## 2015-05-25 DIAGNOSIS — I130 Hypertensive heart and chronic kidney disease with heart failure and stage 1 through stage 4 chronic kidney disease, or unspecified chronic kidney disease: Principal | ICD-10-CM | POA: Diagnosis present

## 2015-05-25 DIAGNOSIS — M109 Gout, unspecified: Secondary | ICD-10-CM | POA: Diagnosis present

## 2015-05-25 DIAGNOSIS — E1122 Type 2 diabetes mellitus with diabetic chronic kidney disease: Secondary | ICD-10-CM | POA: Diagnosis present

## 2015-05-25 DIAGNOSIS — I509 Heart failure, unspecified: Secondary | ICD-10-CM

## 2015-05-25 DIAGNOSIS — I2510 Atherosclerotic heart disease of native coronary artery without angina pectoris: Secondary | ICD-10-CM | POA: Diagnosis present

## 2015-05-25 DIAGNOSIS — I272 Other secondary pulmonary hypertension: Secondary | ICD-10-CM | POA: Diagnosis present

## 2015-05-25 LAB — CBC WITH DIFFERENTIAL/PLATELET
BASOS ABS: 0 10*3/uL (ref 0.0–0.1)
Basophils Relative: 0 %
Eosinophils Absolute: 0 10*3/uL (ref 0.0–0.7)
Eosinophils Relative: 0 %
HEMATOCRIT: 47.1 % — AB (ref 36.0–46.0)
HEMOGLOBIN: 15.9 g/dL — AB (ref 12.0–15.0)
LYMPHS PCT: 29 %
Lymphs Abs: 1.3 10*3/uL (ref 0.7–4.0)
MCH: 27.7 pg (ref 26.0–34.0)
MCHC: 33.8 g/dL (ref 30.0–36.0)
MCV: 82.2 fL (ref 78.0–100.0)
MONO ABS: 0.4 10*3/uL (ref 0.1–1.0)
Monocytes Relative: 8 %
NEUTROS ABS: 2.8 10*3/uL (ref 1.7–7.7)
NEUTROS PCT: 63 %
Platelets: 236 10*3/uL (ref 150–400)
RBC: 5.73 MIL/uL — AB (ref 3.87–5.11)
RDW: 18.6 % — AB (ref 11.5–15.5)
WBC: 4.5 10*3/uL (ref 4.0–10.5)

## 2015-05-25 LAB — COMPREHENSIVE METABOLIC PANEL
ALK PHOS: 87 U/L (ref 38–126)
ALT: 16 U/L (ref 14–54)
AST: 27 U/L (ref 15–41)
Albumin: 3.7 g/dL (ref 3.5–5.0)
Anion gap: 15 (ref 5–15)
BILIRUBIN TOTAL: 3.5 mg/dL — AB (ref 0.3–1.2)
BUN: 76 mg/dL — AB (ref 6–20)
CO2: 24 mmol/L (ref 22–32)
CREATININE: 2.11 mg/dL — AB (ref 0.44–1.00)
Calcium: 9.7 mg/dL (ref 8.9–10.3)
Chloride: 104 mmol/L (ref 101–111)
GFR calc Af Amer: 28 mL/min — ABNORMAL LOW (ref >60)
GFR, EST NON AFRICAN AMERICAN: 24 mL/min — AB (ref >60)
GLUCOSE: 107 mg/dL — AB (ref 65–99)
Potassium: 3.9 mmol/L (ref 3.5–5.1)
Sodium: 143 mmol/L (ref 135–145)
TOTAL PROTEIN: 7.2 g/dL (ref 6.5–8.1)

## 2015-05-25 LAB — BRAIN NATRIURETIC PEPTIDE: B NATRIURETIC PEPTIDE 5: 2818.9 pg/mL — AB (ref 0.0–100.0)

## 2015-05-25 LAB — TROPONIN I
Troponin I: <0.03 ng/mL (ref <0.031)
Troponin I: <0.03 ng/mL (ref <0.031)

## 2015-05-25 LAB — GLUCOSE, CAPILLARY
Glucose-Capillary: 127 mg/dL — ABNORMAL HIGH (ref 65–99)
Glucose-Capillary: 207 mg/dL — ABNORMAL HIGH (ref 65–99)

## 2015-05-25 MED ORDER — ACETAMINOPHEN 325 MG PO TABS: 650.0000 mg | ORAL_TABLET | ORAL | Status: DC | PRN

## 2015-05-25 MED ORDER — SODIUM CHLORIDE 0.9 % IV SOLN: 250.0000 mL | INTRAVENOUS | Status: DC | PRN

## 2015-05-25 MED ORDER — ONDANSETRON HCL 4 MG/2ML IJ SOLN: 4.0000 mg | Freq: Four times a day (QID) | INTRAMUSCULAR | Status: DC | PRN

## 2015-05-25 MED ORDER — SODIUM CHLORIDE 0.9 % IJ SOLN: 3.0000 mL | INTRAMUSCULAR | Status: DC | PRN

## 2015-05-25 MED ORDER — METOLAZONE 5 MG PO TABS
5.0000 mg | ORAL_TABLET | Freq: Every day | ORAL | Status: DC
  Administered 2015-05-25 – 2015-05-29 (×4): 5 mg via ORAL
  Filled 2015-05-25 (×5): qty 1

## 2015-05-25 MED ORDER — POTASSIUM CHLORIDE CRYS ER 20 MEQ PO TBCR
20.0000 meq | EXTENDED_RELEASE_TABLET | Freq: Two times a day (BID) | ORAL | Status: DC
  Administered 2015-05-25 – 2015-05-27 (×5): 20 meq via ORAL
  Filled 2015-05-25 (×5): qty 1

## 2015-05-25 MED ORDER — ALLOPURINOL 100 MG PO TABS
100.0000 mg | ORAL_TABLET | Freq: Two times a day (BID) | ORAL | Status: DC
  Administered 2015-05-25 – 2015-05-29 (×8): 100 mg via ORAL
  Filled 2015-05-25 (×8): qty 1

## 2015-05-25 MED ORDER — HEPARIN SODIUM (PORCINE) 5000 UNIT/ML IJ SOLN
5000.0000 [IU] | Freq: Three times a day (TID) | INTRAMUSCULAR | Status: DC
  Administered 2015-05-25 – 2015-05-29 (×12): 5000 [IU] via SUBCUTANEOUS
  Filled 2015-05-25 (×13): qty 1

## 2015-05-25 MED ORDER — SODIUM CHLORIDE 0.9 % IJ SOLN
3.0000 mL | Freq: Two times a day (BID) | INTRAMUSCULAR | Status: DC
  Administered 2015-05-25 – 2015-05-28 (×4): 3 mL via INTRAVENOUS

## 2015-05-25 MED ORDER — SPIRONOLACTONE 25 MG PO TABS
25.0000 mg | ORAL_TABLET | Freq: Every day | ORAL | Status: DC
  Administered 2015-05-25 – 2015-05-29 (×4): 25 mg via ORAL
  Filled 2015-05-25 (×5): qty 1

## 2015-05-25 MED ORDER — AMLODIPINE BESYLATE 5 MG PO TABS
5.0000 mg | ORAL_TABLET | Freq: Every day | ORAL | Status: DC
  Administered 2015-05-25 – 2015-05-29 (×5): 5 mg via ORAL
  Filled 2015-05-25 (×5): qty 1

## 2015-05-25 MED ORDER — CARVEDILOL 3.125 MG PO TABS
3.1250 mg | ORAL_TABLET | Freq: Two times a day (BID) | ORAL | Status: DC
  Administered 2015-05-25: 3.125 mg via ORAL
  Filled 2015-05-25 (×2): qty 1

## 2015-05-25 MED ORDER — LINAGLIPTIN 5 MG PO TABS
5.0000 mg | ORAL_TABLET | Freq: Every day | ORAL | Status: DC
  Administered 2015-05-25 – 2015-05-29 (×5): 5 mg via ORAL
  Filled 2015-05-25 (×5): qty 1

## 2015-05-25 MED ORDER — FUROSEMIDE 10 MG/ML IJ SOLN
40.0000 mg | Freq: Two times a day (BID) | INTRAMUSCULAR | Status: DC
  Administered 2015-05-25 – 2015-05-28 (×6): 40 mg via INTRAVENOUS
  Filled 2015-05-25 (×8): qty 4

## 2015-05-25 MED ORDER — ALPRAZOLAM 0.25 MG PO TABS
0.2500 mg | ORAL_TABLET | Freq: Two times a day (BID) | ORAL | Status: DC | PRN
Start: 2015-05-25 — End: 2015-05-29

## 2015-05-25 NOTE — H&P (Signed)
Referring Physician:  SYLEENA MCHAN is an 64 y.o. [name].                       Chief Complaint: Leg edema and shortness of breath.  HPI: [name] presents with leg edema and progressive worsening of shortness of breath and 12 pound weight gain x 2-3 weeks has past medical history of acute biventricular systolic heart failure, CAD, status post stent in LAD, diabetes mellitus type 2, hypertension and anxiety. No fever or chest pain. Also has nausea and diarrhea form stomach virus x 3 days.  Past Medical History  Diagnosis Date  . Hypertension   . Coronary artery disease   . CHF (congestive heart failure)   . Shortness of breath dyspnea   . High cholesterol   . Type II diabetes mellitus   . GERD (gastroesophageal reflux disease)   . Gout       Past Surgical History  Procedure Laterality Date  . Left and right heart catheterization with coronary angiogram N/A 11/25/2011    Procedure: LEFT AND RIGHT HEART CATHETERIZATION WITH CORONARY ANGIOGRAM;  Surgeon: Robynn Pane, MD;  Location: [facility] CATH LAB;  Service: Cardiovascular;  Laterality: N/A;  . Percutaneous coronary stent intervention (pci-s) Right 11/25/2011    Procedure: PERCUTANEOUS CORONARY STENT INTERVENTION (PCI-S);  Surgeon: Robynn Pane, MD;  Location: [facility] CATH LAB;  Service: Cardiovascular;  Laterality: Right;  . Left and right heart catheterization with coronary angiogram N/A 09/07/2012    Procedure: LEFT AND RIGHT HEART CATHETERIZATION WITH CORONARY ANGIOGRAM;  Surgeon: [name], MD;  Location: [facility] CATH LAB;  Service: Cardiovascular;  Laterality: N/A;  . Tonsillectomy  1950's  . Coronary angioplasty with stent placement  2013  . Cardiac catheterization  2014    No family history on file. Social History:  reports that she has quit smoking. Her smoking use included Cigarettes. She has a 10 pack-year smoking history. She has never used smokeless tobacco. She reports that she does not drink alcohol or use  illicit drugs.  Allergies:  Allergies  Allergen Reactions  . Bidil [Isosorb Dinitrate-Hydralazine] Itching    Jittery, feels like something is crawling    Medications Prior to Admission  Medication Sig Dispense Refill  . allopurinol (ZYLOPRIM) 100 MG tablet Take 1 tablet (100 mg total) by mouth daily. (Patient taking differently: Take 100 mg by mouth 2 (two) times daily. )    . amLODipine (NORVASC) 5 MG tablet Take 1 tablet (5 mg total) by mouth daily. 30 tablet 1  . aspirin 81 MG chewable tablet Chew 81 mg by mouth daily.    . carvedilol (COREG) 3.125 MG tablet Take 1 tablet (3.125 mg total) by mouth 2 (two) times daily with a meal. 60 tablet 6  . furosemide (LASIX) 80 MG tablet Take 0.5 tablets (40 mg total) by mouth 2 (two) times daily.    Marland Kitchen linagliptin (TRADJENTA) 5 MG TABS tablet Take 1 tablet (5 mg total) by mouth daily. 30 tablet 1  . metolazone (ZAROXOLYN) 5 MG tablet Take 5 mg by mouth daily.    . nitroGLYCERIN (NITROSTAT) 0.4 MG SL tablet Place 0.4 mg under the tongue every 5 (five) minutes as needed for chest pain.    . potassium chloride (K-DUR,KLOR-CON) 10 MEQ tablet Take 10 mEq by mouth 2 (two) times daily.    Marland Kitchen spironolactone (ALDACTONE) 25 MG tablet Take 25 mg by mouth daily.      No results  found for this or any previous visit (from the past 48 hour(s)). No results found.  Review Of Systems + wears glasses, + weight gain + partial dentures, + COPD, + angina, + leg edema, + hypertension, + DM, II, + CHF, No CVA, No seizures, No kidney stone, + arthritis.  Blood pressure 171/93, pulse 85, resp. rate 18, height 5\' 6"  (1.676 m), weight 71.532 kg (157 lb 11.2 oz), SpO2 97 %. Physical exam: General: Averagely built and nourished in mild respiratory distress. HEENT: [place]/AT, brown eyes, PERL, EOMI. conjuntiva-pink, sclera-white. Neck: + JVD, no bruit, no thyromegaly.  Lungs: Bilateral basal crackles.  Heart: Normal S1 and S2. Grade III/VI systolic murmur LSB  Abdomen:  Mild swelling, non-tender.  Ext: 2 + edema up to thighs, bilaterally.  CNS: Cranial nerves grossly intact. Moves all 4 extremities. Skin: Warm and dry.  Assessment/Plan Acute Biventricular systolic heart failure  CAD  S/P Stent in LAD  DM, II  Hypertension  Anxiety  IV lasix. Home medications. R/O MI.  [name],Ayane Delancey S, MD  05/25/2015, 3:03 PM

## 2015-05-26 ENCOUNTER — Inpatient Hospital Stay (HOSPITAL_COMMUNITY): Payer: 59

## 2015-05-26 ENCOUNTER — Other Ambulatory Visit (HOSPITAL_COMMUNITY): Payer: 59

## 2015-05-26 LAB — GLUCOSE, CAPILLARY
GLUCOSE-CAPILLARY: 142 mg/dL — AB (ref 65–99)
GLUCOSE-CAPILLARY: 167 mg/dL — AB (ref 65–99)
Glucose-Capillary: 183 mg/dL — ABNORMAL HIGH (ref 65–99)
Glucose-Capillary: 120 mg/dL — ABNORMAL HIGH (ref 65–99)

## 2015-05-26 LAB — BASIC METABOLIC PANEL
Anion gap: 11 (ref 5–15)
BUN: 78 mg/dL — AB (ref 6–20)
CALCIUM: 9.1 mg/dL (ref 8.9–10.3)
CO2: 23 mmol/L (ref 22–32)
CREATININE: 2.23 mg/dL — AB (ref 0.44–1.00)
Chloride: 103 mmol/L (ref 101–111)
GFR, EST AFRICAN AMERICAN: 26 mL/min — AB (ref >60)
GFR, EST NON AFRICAN AMERICAN: 22 mL/min — AB (ref >60)
Glucose, Bld: 145 mg/dL — ABNORMAL HIGH (ref 65–99)
Potassium: 4.8 mmol/L (ref 3.5–5.1)
SODIUM: 137 mmol/L (ref 135–145)

## 2015-05-26 LAB — TROPONIN I

## 2015-05-26 MED ORDER — DOPAMINE-DEXTROSE 3.2-5 MG/ML-% IV SOLN
2.5000 ug/kg/min | INTRAVENOUS | Status: DC
  Administered 2015-05-26: 2.5 ug/kg/min via INTRAVENOUS
  Filled 2015-05-26 (×2): qty 250

## 2015-05-26 NOTE — Progress Notes (Signed)
Utilization review completed. Tunis Gentle, RN, BSN. 

## 2015-05-26 NOTE — Progress Notes (Signed)
  Echocardiogram 2D Echocardiogram has been performed.  [name], [name] 05/26/2015, 4:51 PM

## 2015-05-26 NOTE — Progress Notes (Signed)
Ref: [name], MD   Subjective:  "Not peeing as much". Some medication making her sleepy.  Objective:  Vital Signs in the last 24 hours: Temp:  [97.5 F (36.4 C)-98.6 F (37 C)] 98.6 F (37 C) (11/15 2033) Pulse Rate:  [65-77] 76 (11/15 2033) Cardiac Rhythm:  [-] Normal sinus rhythm (11/15 1955) Resp:  [16-18] 17 (11/15 2033) BP: (129-154)/(67-94) 154/81 mmHg (11/15 2033) SpO2:  [97 %-100 %] 98 % (11/15 2033) Weight:  [71.623 kg (157 lb 14.4 oz)] 71.623 kg (157 lb 14.4 oz) (11/15 0526)  Physical Exam: BP Readings from Last 1 Encounters:  05/26/15 154/81    Wt Readings from Last 1 Encounters:  05/26/15 71.623 kg (157 lb 14.4 oz)    Weight change:   HEENT: [place]/AT, Eyes-Brown, PERL, EOMI, Conjunctiva-Pink, Sclera-Non-icteric Neck: + JVD, No bruit, Trachea midline. Lungs:  Clearing, Bilateral. Cardiac:  Regular rhythm, normal S1 and S2, no S3. III/VI systolic murmur. Abdomen:  Soft, non-tender. Extremities:  2 + edema up to knees present. No cyanosis. No clubbing. CNS: AxOx3, Cranial nerves grossly intact, moves all 4 extremities. Right handed. Skin: Warm and dry.   Intake/Output from previous day: 11/14 0701 - 11/15 0700 In: 600 [P.O.:600] Out: 1450 [Urine:1450]    Lab Results: BMET    Component Value Date/Time   NA 137 05/26/2015 0258   NA 143 05/25/2015 1634   NA 135 04/06/2015 0316   K 4.8 05/26/2015 0258   K 3.9 05/25/2015 1634   K 4.0 04/06/2015 0316   CL 103 05/26/2015 0258   CL 104 05/25/2015 1634   CL 96* 04/06/2015 0316   CO2 23 05/26/2015 0258   CO2 24 05/25/2015 1634   CO2 29 04/06/2015 0316   GLUCOSE 145* 05/26/2015 0258   GLUCOSE 107* 05/25/2015 1634   GLUCOSE 136* 04/06/2015 0316   BUN 78* 05/26/2015 0258   BUN 76* 05/25/2015 1634   BUN 58* 04/06/2015 0316   CREATININE 2.23* 05/26/2015 0258   CREATININE 2.11* 05/25/2015 1634   CREATININE 2.15* 04/06/2015 0316   CALCIUM 9.1 05/26/2015 0258   CALCIUM 9.7 05/25/2015 1634   CALCIUM 9.1  04/06/2015 0316   GFRNONAA 22* 05/26/2015 0258   GFRNONAA 24* 05/25/2015 1634   GFRNONAA 23* 04/06/2015 0316   GFRAA 26* 05/26/2015 0258   GFRAA 28* 05/25/2015 1634   GFRAA 27* 04/06/2015 0316   CBC    Component Value Date/Time   WBC 4.5 05/25/2015 1634   RBC 5.73* 05/25/2015 1634   HGB 15.9* 05/25/2015 1634   HCT 47.1* 05/25/2015 1634   PLT 236 05/25/2015 1634   MCV 82.2 05/25/2015 1634   MCH 27.7 05/25/2015 1634   MCHC 33.8 05/25/2015 1634   RDW 18.6* 05/25/2015 1634   LYMPHSABS 1.3 05/25/2015 1634   MONOABS 0.4 05/25/2015 1634   EOSABS 0.0 05/25/2015 1634   BASOSABS 0.0 05/25/2015 1634   HEPATIC Function Panel  Recent Labs  02/01/15 1020 04/01/15 1630 05/25/15 1634  PROT 6.6 6.5 7.2   HEMOGLOBIN A1C No components found for: HGA1C,  MPG CARDIAC ENZYMES Lab Results  Component Value Date   CKTOTAL 59 09/07/2012   CKMB 3.2 09/07/2012   TROPONINI <0.03 05/26/2015   TROPONINI <0.03 05/25/2015   TROPONINI <0.03 05/25/2015   BNP  Recent Labs  06/19/14 1420  PROBNP 14027.0*   TSH  Recent Labs  02/01/15 1020  TSH 2.961   CHOLESTEROL No results for input(s): CHOL in the last 8760 hours.  Scheduled Meds: . allopurinol  100  mg Oral BID  . amLODipine  5 mg Oral Daily  . carvedilol  3.125 mg Oral BID WC  . furosemide  40 mg Intravenous BID WC  . heparin  5,000 Units Subcutaneous 3 times per day  . linagliptin  5 mg Oral Daily  . metolazone  5 mg Oral Daily  . potassium chloride  20 mEq Oral BID  . sodium chloride  3 mL Intravenous Q12H  . spironolactone  25 mg Oral Daily   Continuous Infusions: . DOPamine 2.5 mcg/kg/min (05/26/15 1423)   PRN Meds:.sodium chloride, acetaminophen, ALPRAZolam, ondansetron (ZOFRAN) IV, sodium chloride  Assessment/Plan: Acute Biventricular systolic heart failure  CAD  MI ruled out S/P Stent in LAD  DM, II  Hypertension  Anxiety Acute on chronic renal failure, III from hypoperfusion, DM, II and  Hypertension  Add dopamine drip to augment cardiac output. Echocardiogram pending.    LOS: 1 day    Orpah CobbAjay Yeraldine Forney  MD  05/26/2015, 10:28 PM

## 2015-05-27 LAB — BASIC METABOLIC PANEL
Anion gap: 13 (ref 5–15)
BUN: 72 mg/dL — AB (ref 6–20)
CALCIUM: 9.2 mg/dL (ref 8.9–10.3)
CHLORIDE: 104 mmol/L (ref 101–111)
CO2: 23 mmol/L (ref 22–32)
CREATININE: 2.02 mg/dL — AB (ref 0.44–1.00)
GFR, EST AFRICAN AMERICAN: 29 mL/min — AB (ref >60)
GFR, EST NON AFRICAN AMERICAN: 25 mL/min — AB (ref >60)
Glucose, Bld: 146 mg/dL — ABNORMAL HIGH (ref 65–99)
Potassium: 5.1 mmol/L (ref 3.5–5.1)
SODIUM: 140 mmol/L (ref 135–145)

## 2015-05-27 LAB — GLUCOSE, CAPILLARY
GLUCOSE-CAPILLARY: 159 mg/dL — AB (ref 65–99)
GLUCOSE-CAPILLARY: 143 mg/dL — AB (ref 65–99)
Glucose-Capillary: 204 mg/dL — ABNORMAL HIGH (ref 65–99)
Glucose-Capillary: 205 mg/dL — ABNORMAL HIGH (ref 65–99)

## 2015-05-27 MED ORDER — METOPROLOL SUCCINATE ER 25 MG PO TB24
25.0000 mg | ORAL_TABLET | Freq: Every day | ORAL | Status: DC
  Administered 2015-05-27 – 2015-05-29 (×3): 25 mg via ORAL
  Filled 2015-05-27 (×3): qty 1

## 2015-05-27 MED ORDER — CARVEDILOL 3.125 MG PO TABS
3.1250 mg | ORAL_TABLET | Freq: Two times a day (BID) | ORAL | Status: DC
  Filled 2015-05-27 (×2): qty 1

## 2015-05-27 NOTE — Progress Notes (Signed)
LM on Dr. Roseanne KaufmanKadakia's phone notifying him that the pt continues to refuse the ordered Coreg, as she states that it makes her dizzy and sleepy.

## 2015-05-27 NOTE — Progress Notes (Signed)
Ref: Jazyiah Yiu S, MD   Subjective:  Significant diuresis from small dose of dopamine. Coreg causing dizziness per patient. Echocardiogram showed severe LV systolic dysfunction with moderate MR and TR. There is also moderate to severe pulmonary hypertension.  Objective:  Vital Signs in the last 24 hours: Temp:  [97.8 F (36.6 C)-98.1 F (36.7 C)] 98.1 F (36.7 C) (11/16 2102) Pulse Rate:  [66-74] 70 (11/16 2102) Cardiac Rhythm:  [-] Normal sinus rhythm (11/16 2100) Resp:  [17-20] 17 (11/16 2102) BP: (142-158)/(70-87) 147/71 mmHg (11/16 1349) SpO2:  [96 %-99 %] 97 % (11/16 2102) Weight:  [70.262 kg (154 lb 14.4 oz)] 70.262 kg (154 lb 14.4 oz) (11/16 0304)  Physical Exam: BP Readings from Last 1 Encounters:  05/27/15 147/71    Wt Readings from Last 1 Encounters:  05/27/15 70.262 kg (154 lb 14.4 oz)    Weight change: -1.27 kg (-2 lb 12.8 oz)  HEENT: [place]/AT, Eyes-Brown, PERL, EOMI, Conjunctiva-Pink, Sclera-Non-icteric Neck: + JVD, No bruit, Trachea midline. Lungs:  Clearing, Bilateral. Cardiac:  Regular rhythm, normal S1 and S2, no S3. III/VI systolic murmur. Abdomen:  Soft, non-tender. Extremities:  1+ edema of both lower legs and presacral area is present. No cyanosis. No clubbing. CNS: AxOx3, Cranial nerves grossly intact, moves all 4 extremities. Right handed. Skin: Warm and dry.   Intake/Output from previous day: 11/15 0701 - 11/16 0700 In: 900 [P.O.:900] Out: 3150 [Urine:3150]    Lab Results: BMET    Component Value Date/Time   NA 140 05/27/2015 0338   NA 137 05/26/2015 0258   NA 143 05/25/2015 1634   K 5.1 05/27/2015 0338   K 4.8 05/26/2015 0258   K 3.9 05/25/2015 1634   CL 104 05/27/2015 0338   CL 103 05/26/2015 0258   CL 104 05/25/2015 1634   CO2 23 05/27/2015 0338   CO2 23 05/26/2015 0258   CO2 24 05/25/2015 1634   GLUCOSE 146* 05/27/2015 0338   GLUCOSE 145* 05/26/2015 0258   GLUCOSE 107* 05/25/2015 1634   BUN 72* 05/27/2015 0338   BUN 78*  05/26/2015 0258   BUN 76* 05/25/2015 1634   CREATININE 2.02* 05/27/2015 0338   CREATININE 2.23* 05/26/2015 0258   CREATININE 2.11* 05/25/2015 1634   CALCIUM 9.2 05/27/2015 0338   CALCIUM 9.1 05/26/2015 0258   CALCIUM 9.7 05/25/2015 1634   GFRNONAA 25* 05/27/2015 0338   GFRNONAA 22* 05/26/2015 0258   GFRNONAA 24* 05/25/2015 1634   GFRAA 29* 05/27/2015 0338   GFRAA 26* 05/26/2015 0258   GFRAA 28* 05/25/2015 1634   CBC    Component Value Date/Time   WBC 4.5 05/25/2015 1634   RBC 5.73* 05/25/2015 1634   HGB 15.9* 05/25/2015 1634   HCT 47.1* 05/25/2015 1634   PLT 236 05/25/2015 1634   MCV 82.2 05/25/2015 1634   MCH 27.7 05/25/2015 1634   MCHC 33.8 05/25/2015 1634   RDW 18.6* 05/25/2015 1634   LYMPHSABS 1.3 05/25/2015 1634   MONOABS 0.4 05/25/2015 1634   EOSABS 0.0 05/25/2015 1634   BASOSABS 0.0 05/25/2015 1634   HEPATIC Function Panel  Recent Labs  02/01/15 1020 04/01/15 1630 05/25/15 1634  PROT 6.6 6.5 7.2   HEMOGLOBIN A1C No components found for: HGA1C,  MPG CARDIAC ENZYMES Lab Results  Component Value Date   CKTOTAL 59 09/07/2012   CKMB 3.2 09/07/2012   TROPONINI <0.03 05/26/2015   TROPONINI <0.03 05/25/2015   TROPONINI <0.03 05/25/2015   BNP  Recent Labs  06/19/14 1420  PROBNP 14027.0*  TSH  Recent Labs  02/01/15 1020  TSH 2.961   CHOLESTEROL No results for input(s): CHOL in the last 8760 hours.  Scheduled Meds: . allopurinol  100 mg Oral BID  . amLODipine  5 mg Oral Daily  . furosemide  40 mg Intravenous BID WC  . heparin  5,000 Units Subcutaneous 3 times per day  . linagliptin  5 mg Oral Daily  . metolazone  5 mg Oral Daily  . metoprolol succinate  25 mg Oral Daily  . sodium chloride  3 mL Intravenous Q12H  . spironolactone  25 mg Oral Daily   Continuous Infusions: . DOPamine 2.5 mcg/kg/min (05/26/15 1423)   PRN Meds:.sodium chloride, acetaminophen, ALPRAZolam, ondansetron (ZOFRAN) IV, sodium chloride  Assessment/Plan: Acute  Biventricular systolic heart failure  CAD  MI ruled out S/P Stent in LAD  DM, II  Hypertension  Anxiety Acute on chronic renal failure, III from hypoperfusion, DM, II and Hypertension Pulmonary hypertension Moderate MR and TR  Continue medical treatment.     LOS: 2 days    Orpah CobbAjay Vyom Brass  MD  05/27/2015, 9:44 PM

## 2015-05-28 LAB — BASIC METABOLIC PANEL
Anion gap: 10 (ref 5–15)
BUN: 61 mg/dL — ABNORMAL HIGH (ref 6–20)
CALCIUM: 9.4 mg/dL (ref 8.9–10.3)
CO2: 27 mmol/L (ref 22–32)
CREATININE: 1.57 mg/dL — AB (ref 0.44–1.00)
Chloride: 100 mmol/L — ABNORMAL LOW (ref 101–111)
GFR calc Af Amer: 39 mL/min — ABNORMAL LOW (ref >60)
GFR calc non Af Amer: 34 mL/min — ABNORMAL LOW (ref >60)
GLUCOSE: 136 mg/dL — AB (ref 65–99)
Potassium: 4.4 mmol/L (ref 3.5–5.1)
SODIUM: 137 mmol/L (ref 135–145)

## 2015-05-28 LAB — LIPID PANEL
CHOLESTEROL: 157 mg/dL (ref 0–200)
HDL: 43 mg/dL (ref >40)
LDL Cholesterol: 99 mg/dL (ref 0–99)
TRIGLYCERIDES: 77 mg/dL (ref <150)
Total CHOL/HDL Ratio: 3.7 RATIO
VLDL: 15 mg/dL (ref 0–40)

## 2015-05-28 LAB — GLUCOSE, CAPILLARY
GLUCOSE-CAPILLARY: 237 mg/dL — AB (ref 65–99)
GLUCOSE-CAPILLARY: 173 mg/dL — AB (ref 65–99)
Glucose-Capillary: 166 mg/dL — ABNORMAL HIGH (ref 65–99)
Glucose-Capillary: 139 mg/dL — ABNORMAL HIGH (ref 65–99)

## 2015-05-28 MED ORDER — FUROSEMIDE 10 MG/ML IJ SOLN
40.0000 mg | Freq: Two times a day (BID) | INTRAMUSCULAR | Status: DC
  Administered 2015-05-28 – 2015-05-29 (×2): 40 mg via INTRAVENOUS
  Filled 2015-05-28 (×2): qty 4

## 2015-05-28 NOTE — Progress Notes (Signed)
Pt c/o  Of feeling a little dizzy.  Noted to be up in chair.  BP 178/102 P 120.  Stated that "I just had my lasix and not sure if it make me feel this way.)  Pt's nurse notified and instructed that pt feels that way when her BP goes up.  Rechecked BP 179/88.  P114.  Primary nurse says will continue to monitor.  Amanda PeaNellie Bri Wakeman, Charity fundraiserN.

## 2015-05-28 NOTE — Progress Notes (Signed)
Ref: Tanya Rodriguez, MD   Subjective:  Lost about 10 pounds in 3 days. Breathing has improved. Admits to dietary non-compliance.  Objective:  Vital Signs in the last 24 hours: Temp:  [97.7 F (36.5 C)-98.5 F (36.9 C)] 98.2 F (36.8 C) (11/17 1557) Pulse Rate:  [67-130] 68 (11/17 1557) Cardiac Rhythm:  [-] Normal sinus rhythm (11/17 1133) Resp:  [16-20] 16 (11/17 1557) BP: (144-177)/(65-102) 150/74 mmHg (11/17 1557) SpO2:  [96 %-100 %] 99 % (11/17 1557) Weight:  [66.996 kg (147 lb 11.2 oz)] 66.996 kg (147 lb 11.2 oz) (11/17 0502)  Physical Exam: BP Readings from Last 1 Encounters:  05/28/15 150/74    Wt Readings from Last 1 Encounters:  05/28/15 66.996 kg (147 lb 11.2 oz)    Weight change: -3.266 kg (-7 lb 3.2 oz)  HEENT: [place]/AT, Eyes-Brown, PERL, EOMI, Conjunctiva-Pink, Sclera-Non-icteric Neck: No JVD but prominent jugular pulsations at 90 degree angle, No bruit, Trachea midline. Lungs:  Clearing, Bilateral. Cardiac:  Regular rhythm, normal S1 and S2, no S3. III/VI systolic murmur. Abdomen:  Soft, non-tender. Extremities:  1 + edema present. No cyanosis. No clubbing. CNS: AxOx3, Cranial nerves grossly intact, moves all 4 extremities. Right handed. Skin: Warm and dry.   Intake/Output from previous day: 11/16 0701 - 11/17 0700 In: 1448.1 [P.O.:1310; I.V.:138.1] Out: 5400 [Urine:5400]    Lab Results: BMET    Component Value Date/Time   NA 137 05/28/2015 0654   NA 140 05/27/2015 0338   NA 137 05/26/2015 0258   K 4.4 05/28/2015 0654   K 5.1 05/27/2015 0338   K 4.8 05/26/2015 0258   CL 100* 05/28/2015 0654   CL 104 05/27/2015 0338   CL 103 05/26/2015 0258   CO2 27 05/28/2015 0654   CO2 23 05/27/2015 0338   CO2 23 05/26/2015 0258   GLUCOSE 136* 05/28/2015 0654   GLUCOSE 146* 05/27/2015 0338   GLUCOSE 145* 05/26/2015 0258   BUN 61* 05/28/2015 0654   BUN 72* 05/27/2015 0338   BUN 78* 05/26/2015 0258   CREATININE 1.57* 05/28/2015 0654   CREATININE 2.02*  05/27/2015 0338   CREATININE 2.23* 05/26/2015 0258   CALCIUM 9.4 05/28/2015 0654   CALCIUM 9.2 05/27/2015 0338   CALCIUM 9.1 05/26/2015 0258   GFRNONAA 34* 05/28/2015 0654   GFRNONAA 25* 05/27/2015 0338   GFRNONAA 22* 05/26/2015 0258   GFRAA 39* 05/28/2015 0654   GFRAA 29* 05/27/2015 0338   GFRAA 26* 05/26/2015 0258   CBC    Component Value Date/Time   WBC 4.5 05/25/2015 1634   RBC 5.73* 05/25/2015 1634   HGB 15.9* 05/25/2015 1634   HCT 47.1* 05/25/2015 1634   PLT 236 05/25/2015 1634   MCV 82.2 05/25/2015 1634   MCH 27.7 05/25/2015 1634   MCHC 33.8 05/25/2015 1634   RDW 18.6* 05/25/2015 1634   LYMPHSABS 1.3 05/25/2015 1634   MONOABS 0.4 05/25/2015 1634   EOSABS 0.0 05/25/2015 1634   BASOSABS 0.0 05/25/2015 1634   HEPATIC Function Panel  Recent Labs  02/01/15 1020 04/01/15 1630 05/25/15 1634  PROT 6.6 6.5 7.2   HEMOGLOBIN A1C No components found for: HGA1C,  MPG CARDIAC ENZYMES Lab Results  Component Value Date   CKTOTAL 59 09/07/2012   CKMB 3.2 09/07/2012   TROPONINI <0.03 05/26/2015   TROPONINI <0.03 05/25/2015   TROPONINI <0.03 05/25/2015   BNP  Recent Labs  06/19/14 1420  PROBNP 14027.0*   TSH  Recent Labs  02/01/15 1020  TSH 2.961   CHOLESTEROL  Recent Labs  05/28/15 0654  CHOL 157    Scheduled Meds: . allopurinol  100 mg Oral BID  . amLODipine  5 mg Oral Daily  . furosemide  40 mg Intravenous Q12H  . heparin  5,000 Units Subcutaneous 3 times per day  . linagliptin  5 mg Oral Daily  . metolazone  5 mg Oral Daily  . metoprolol succinate  25 mg Oral Daily  . sodium chloride  3 mL Intravenous Q12H  . spironolactone  25 mg Oral Daily   Continuous Infusions: . DOPamine 2.5 mcg/kg/min (05/26/15 1423)   PRN Meds:.sodium chloride, acetaminophen, ALPRAZolam, ondansetron (ZOFRAN) IV, sodium chloride  Assessment/Plan: Acute Biventricular systolic heart failure  CAD  MI ruled out S/P Stent in LAD  DM, II  Hypertension   Anxiety Acute on chronic renal failure, III from hypoperfusion, DM, II and Hypertension Pulmonary hypertension Moderate MR and TR   Continue medical treatment.   LOS: 3 days    Orpah CobbAjay Ebony Yorio  MD  05/28/2015, 6:06 PM

## 2015-05-28 NOTE — Progress Notes (Signed)
Inpatient Diabetes Program Recommendations  AACE/ADA: New Consensus Statement on Inpatient Glycemic Control (2015)  Target Ranges:  Prepandial:   less than 140 mg/dL      Peak postprandial:   less than 180 mg/dL (1-2 hours)      Critically ill patients:  140 - 180 mg/dL   Review of Glycemic Control  Diabetes history: DM 2 Outpatient Diabetes medications: Tradjenta 5 mg Daily Current orders for Inpatient glycemic control: Tradjenta 5 mg Daily  Inpatient Diabetes Program Recommendations: Correction (SSI): Patient's glucose increases into the 200's around meal times. Please consider ordering CBGs and Novolog Sensitive Correction while inpatient.   Thanks,  Christena DeemShannon Monicia Tse RN, MSN, [facility] Inpatient Diabetes Coordinator Team Pager [phone] (8a-5p)

## 2015-05-29 LAB — BASIC METABOLIC PANEL
ANION GAP: 11 (ref 5–15)
BUN: 53 mg/dL — ABNORMAL HIGH (ref 6–20)
CALCIUM: 9.1 mg/dL (ref 8.9–10.3)
CO2: 28 mmol/L (ref 22–32)
Chloride: 99 mmol/L — ABNORMAL LOW (ref 101–111)
Creatinine, Ser: 1.56 mg/dL — ABNORMAL HIGH (ref 0.44–1.00)
GFR, EST AFRICAN AMERICAN: 40 mL/min — AB (ref >60)
GFR, EST NON AFRICAN AMERICAN: 34 mL/min — AB (ref >60)
Glucose, Bld: 128 mg/dL — ABNORMAL HIGH (ref 65–99)
POTASSIUM: 4.1 mmol/L (ref 3.5–5.1)
Sodium: 138 mmol/L (ref 135–145)

## 2015-05-29 LAB — GLUCOSE, CAPILLARY
Glucose-Capillary: 101 mg/dL — ABNORMAL HIGH (ref 65–99)
Glucose-Capillary: 121 mg/dL — ABNORMAL HIGH (ref 65–99)

## 2015-05-29 MED ORDER — POTASSIUM CHLORIDE CRYS ER 10 MEQ PO TBCR: 10.0000 meq | EXTENDED_RELEASE_TABLET | Freq: Every day | ORAL | Status: DC

## 2015-05-29 MED ORDER — FUROSEMIDE 80 MG PO TABS: 80.0000 mg | ORAL_TABLET | Freq: Two times a day (BID) | ORAL | Status: DC

## 2015-05-29 MED ORDER — METOPROLOL SUCCINATE ER 25 MG PO TB24: 25.0000 mg | ORAL_TABLET | Freq: Every day | ORAL | Status: DC

## 2015-05-29 NOTE — Discharge Summary (Signed)
Physician Discharge Summary  Patient ID: [name] MRN: [id number] DOB/AGE: Dec 19, 1950 64 y.o.  Admit date: 05/25/2015 Discharge date: 05/29/2015  Admission Diagnoses: Acute Biventricular systolic heart failure  CAD  S/P Stent in LAD  DM, II  Hypertension  Anxiety  Discharge Diagnoses:  Principal Problem:   Biventricular heart failure, NYHA class 2 (HCC) Active Problems:   Diabetes mellitus, II (HCC)   Essential hypertension   CAD    MI ruled out   S/P Stent in LAD    DM, II    Anxiety   Acute on chronic renal failure, II from hypoperfusion, DM, II and Hypertension   Pulmonary hypertension   Moderate MR and TR  Discharged Condition: good  Hospital Course: 64 year old female presents with leg edema and progressive worsening of shortness of breath and 12 pound weight gain x 2-3 weeks has past medical history of acute biventricular systolic heart failure, CAD, status post stent in LAD, diabetes mellitus type 2, hypertension and anxiety. She improved in 5 days with 10 pounds + fluid and weight loss from lasix and dopamine drip. Metoprolol replaced coreg as patient felt coreg was making her sleepy. She admits to dietary non-compliance. She will be followed by me in 1 week.  Consults: cardiology  Significant Diagnostic Studies: labs: Near normal CBC, BMET except BUN /Cr of 76/2.11 improving to 53/1.56 on day of discharge. BNP of 2818.9. Troponin-I normal x 3. Normal lipid panel.  EKG-NSR, low voltage.  Chest X-ray: Stable atelectasis.   Echocardiogram: - Left ventricle: The cavity size was mildly dilated. There wasmild concentric hypertrophy. Systolic function was severelyreduced. The estimated ejection fraction was in the range of 25%to 30%. There is moderate hypokinesis of the entire myocardium. - Aortic valve: There was mild regurgitation. - Mitral valve: There was moderate regurgitation. - Left atrium: The atrium was moderately to severely dilated. -  Right ventricle: The cavity size was moderately dilated. Wallthickness was normal. - Right atrium: The atrium was moderately to severely dilated. - Tricuspid valve: There was moderate regurgitation. - Pulmonary arteries: Systolic pressure was moderately to severelyincreased. PA peak pressure: 61 mm Hg (S).  Treatments: cardiac meds: metoprolol, amlodipine, furosemide and spironolactone.   Discharge Exam: Blood pressure 142/65, pulse 74, temperature 97.6 F (36.4 C), temperature source Oral, resp. rate 16, height 5\' 6"  (1.676 m), weight 65.182 kg (143 lb 11.2 oz), SpO2 100 %. Physical Exam: HEENT: [place]/AT, Eyes-Brown, PERL, EOMI, Conjunctiva-Pink, Sclera-Non-icteric Neck: No JVD but prominent jugular pulsations at 90 degree angle, No bruit, Trachea midline. Lungs: Clearing, Bilateral. Cardiac: Regular rhythm, normal S1 and S2, no S3. III/VI systolic murmur. Abdomen: Soft, non-tender. Extremities: Trace lower leg and presacral edema present. No cyanosis. No clubbing. CNS: AxOx3, Cranial nerves grossly intact, moves all 4 extremities. Right handed. Skin: Warm and dry.  Disposition: 01-Home or Self Care     Medication List    STOP taking these medications        carvedilol 3.125 MG tablet  Commonly known as:  COREG      TAKE these medications        allopurinol 100 MG tablet  Commonly known as:  ZYLOPRIM  Take 1 tablet (100 mg total) by mouth daily.     amLODipine 5 MG tablet  Commonly known as:  NORVASC  Take 1 tablet (5 mg total) by mouth daily.     aspirin 81 MG chewable tablet  Chew 81 mg by mouth daily.     furosemide 80 MG tablet  Commonly known as:  LASIX  Take 1 tablet (80 mg total) by mouth 2 (two) times daily.     linagliptin 5 MG Tabs tablet  Commonly known as:  TRADJENTA  Take 1 tablet (5 mg total) by mouth daily.     metolazone 5 MG tablet  Commonly known as:  ZAROXOLYN  Take 5 mg by mouth daily.     metoprolol succinate 25 MG 24 hr tablet   Commonly known as:  TOPROL-XL  Take 1 tablet (25 mg total) by mouth daily.     nitroGLYCERIN 0.4 MG SL tablet  Commonly known as:  NITROSTAT  Place 0.4 mg under the tongue every 5 (five) minutes as needed for chest pain.     potassium chloride 10 MEQ tablet  Commonly known as:  K-DUR,KLOR-CON  Take 1 tablet (10 mEq total) by mouth daily.     spironolactone 25 MG tablet  Commonly known as:  ALDACTONE  Take 25 mg by mouth daily.           Follow-up Information    Follow up with [facility].   Specialty:  Home Health Services   Why:  They will do your home health care at your home   Contact information:   [address] [place] [address] [phone]       Follow up with [facility] S, MD. Schedule an appointment as soon as possible for a visit in 1 week.   Specialty:  Cardiology   Contact information:   [address] [place] [address] [phone]       Signed: [name] 05/29/2015, 1:40 PM

## 2015-05-29 NOTE — Progress Notes (Signed)
Orders received for pt discharge.  Discharge summary printed and reviewed with pt.  Explained medication regimen, and pt had no further questions at this time.  IV removed and site remains clean, dry, intact.  Telemetry removed.  Pt in stable condition and awaiting transport. 

## 2015-05-29 NOTE — Care Management Note (Signed)
Case Management Note  Patient Details  Name: [name] MRN: [id number] Date of Birth: 04-20-1951  Subjective/Objective:     Admitted with CHF               Action/Plan: Lives at home with spouse; Biventricular heart failure, [facility]. [facility].   Expected Discharge Date:  05/29/15               Expected Discharge Plan:  Home w Home Health Services  Discharge planning Services  CM Consult  HH Arranged:  RN[facility]  Status of Service:  In process, will continue to follow  Reola MosherChandler, Treavor Blomquist L, RN,MHA,BSN [phone] 05/29/2015, 10:54 AM

## 2015-06-18 ENCOUNTER — Emergency Department (HOSPITAL_COMMUNITY): Payer: 59

## 2015-06-18 ENCOUNTER — Emergency Department (HOSPITAL_COMMUNITY)
Admission: EM | Admit: 2015-06-18 | Discharge: 2015-06-18 | Disposition: A | Discharge status: Home / Self Care | Payer: 59 | Attending: Emergency Medicine | Admitting: Emergency Medicine

## 2015-06-18 ENCOUNTER — Encounter (HOSPITAL_COMMUNITY): Payer: Self-pay | Admitting: Cardiology

## 2015-06-18 DIAGNOSIS — R110 Nausea: Secondary | ICD-10-CM | POA: Diagnosis not present

## 2015-06-18 DIAGNOSIS — E119 Type 2 diabetes mellitus without complications: Secondary | ICD-10-CM | POA: Diagnosis not present

## 2015-06-18 DIAGNOSIS — R05 Cough: Secondary | ICD-10-CM | POA: Diagnosis not present

## 2015-06-18 DIAGNOSIS — Z8719 Personal history of other diseases of the digestive system: Secondary | ICD-10-CM | POA: Diagnosis not present

## 2015-06-18 DIAGNOSIS — R600 Localized edema: Secondary | ICD-10-CM | POA: Insufficient documentation

## 2015-06-18 DIAGNOSIS — Z7982 Long term (current) use of aspirin: Secondary | ICD-10-CM | POA: Insufficient documentation

## 2015-06-18 DIAGNOSIS — Z87891 Personal history of nicotine dependence: Secondary | ICD-10-CM | POA: Diagnosis not present

## 2015-06-18 DIAGNOSIS — Z955 Presence of coronary angioplasty implant and graft: Secondary | ICD-10-CM | POA: Insufficient documentation

## 2015-06-18 DIAGNOSIS — Z79899 Other long term (current) drug therapy: Secondary | ICD-10-CM | POA: Diagnosis not present

## 2015-06-18 DIAGNOSIS — R0602 Shortness of breath: Secondary | ICD-10-CM | POA: Diagnosis not present

## 2015-06-18 DIAGNOSIS — Z9889 Other specified postprocedural states: Secondary | ICD-10-CM | POA: Diagnosis not present

## 2015-06-18 DIAGNOSIS — I2510 Atherosclerotic heart disease of native coronary artery without angina pectoris: Secondary | ICD-10-CM | POA: Insufficient documentation

## 2015-06-18 DIAGNOSIS — I10 Essential (primary) hypertension: Secondary | ICD-10-CM | POA: Diagnosis not present

## 2015-06-18 DIAGNOSIS — M109 Gout, unspecified: Secondary | ICD-10-CM | POA: Diagnosis not present

## 2015-06-18 DIAGNOSIS — I509 Heart failure, unspecified: Secondary | ICD-10-CM | POA: Diagnosis not present

## 2015-06-18 DIAGNOSIS — R0981 Nasal congestion: Secondary | ICD-10-CM | POA: Diagnosis not present

## 2015-06-18 LAB — I-STAT TROPONIN, ED
TROPONIN I, POC: 0 ng/mL (ref 0.00–0.08)
Troponin i, poc: 0 ng/mL (ref 0.00–0.08)

## 2015-06-18 LAB — CBC
HEMATOCRIT: 45.2 % (ref 36.0–46.0)
HEMOGLOBIN: 14.4 g/dL (ref 12.0–15.0)
MCH: 26.4 pg (ref 26.0–34.0)
MCHC: 31.9 g/dL (ref 30.0–36.0)
MCV: 82.9 fL (ref 78.0–100.0)
Platelets: 238 10*3/uL (ref 150–400)
RBC: 5.45 MIL/uL — AB (ref 3.87–5.11)
RDW: 16.8 % — ABNORMAL HIGH (ref 11.5–15.5)
WBC: 4.7 10*3/uL (ref 4.0–10.5)

## 2015-06-18 LAB — BASIC METABOLIC PANEL
ANION GAP: 9 (ref 5–15)
BUN: 63 mg/dL — ABNORMAL HIGH (ref 6–20)
CALCIUM: 8.9 mg/dL (ref 8.9–10.3)
CO2: 28 mmol/L (ref 22–32)
Chloride: 102 mmol/L (ref 101–111)
Creatinine, Ser: 1.88 mg/dL — ABNORMAL HIGH (ref 0.44–1.00)
GFR, EST AFRICAN AMERICAN: 32 mL/min — AB (ref >60)
GFR, EST NON AFRICAN AMERICAN: 27 mL/min — AB (ref >60)
GLUCOSE: 146 mg/dL — AB (ref 65–99)
POTASSIUM: 3.7 mmol/L (ref 3.5–5.1)
Sodium: 139 mmol/L (ref 135–145)

## 2015-06-18 LAB — BRAIN NATRIURETIC PEPTIDE: B Natriuretic Peptide: 2457.9 pg/mL — ABNORMAL HIGH (ref 0.0–100.0)

## 2015-06-18 MED ORDER — FUROSEMIDE 10 MG/ML IJ SOLN
80.0000 mg | Freq: Once | INTRAMUSCULAR | Status: AC
  Administered 2015-06-18: 80 mg via INTRAVENOUS
  Filled 2015-06-18: qty 8

## 2015-06-18 MED ORDER — FUROSEMIDE 10 MG/ML IJ SOLN
40.0000 mg | Freq: Once | INTRAMUSCULAR | Status: AC
  Administered 2015-06-18: 40 mg via INTRAVENOUS
  Filled 2015-06-18: qty 4

## 2015-06-18 NOTE — ED Notes (Signed)
Pt ambulated in [place] and her SpO2 stayed at 100% throughout. Pt denied any SOB.

## 2015-06-18 NOTE — ED Provider Notes (Signed)
CSN: [id number]     Arrival date & time 06/18/15  1125 History   None    Chief Complaint  Patient presents with  . Shortness of Breath    HPI   [name] is a 64 y.o. female with a PMH of HTN, HLD, DM, CAD, CHF who presents to the ED with nasal congestion, cough productive of yellow sputum, and shortness of breath for the past few weeks. She reports associated nausea and lower extremity edema. She denies exacerbating or alleviating factors. She denies fever, chills, HA, dizziness, chest pain, abdominal pain, vomiting, diarrhea, constipation, numbness, weakness, paresthesia. She states she has been taking her lasix at home and has not missed any doses.   Past Medical History  Diagnosis Date  . Hypertension   . Coronary artery disease   . CHF (congestive heart failure) (HCC)   . Shortness of breath dyspnea   . High cholesterol   . Type II diabetes mellitus (HCC)   . GERD (gastroesophageal reflux disease)   . Gout    Past Surgical History  Procedure Laterality Date  . Left and right heart catheterization with coronary angiogram N/A 11/25/2011    Procedure: LEFT AND RIGHT HEART CATHETERIZATION WITH CORONARY ANGIOGRAM;  Surgeon: Robynn Pane, MD;  Location: [facility] CATH LAB;  Service: Cardiovascular;  Laterality: N/A;  . Percutaneous coronary stent intervention (pci-s) Right 11/25/2011    Procedure: PERCUTANEOUS CORONARY STENT INTERVENTION (PCI-S);  Surgeon: Robynn Pane, MD;  Location: [facility] CATH LAB;  Service: Cardiovascular;  Laterality: Right;  . Left and right heart catheterization with coronary angiogram N/A 09/07/2012    Procedure: LEFT AND RIGHT HEART CATHETERIZATION WITH CORONARY ANGIOGRAM;  Surgeon: [name], MD;  Location: [facility] CATH LAB;  Service: Cardiovascular;  Laterality: N/A;  . Tonsillectomy  1950's  . Coronary angioplasty with stent placement  2013  . Cardiac catheterization  2014   History reviewed. No pertinent family history. Social History  Substance Use  Topics  . Smoking status: Former Smoker -- 0.50 packs/day for 20 years    Types: Cigarettes  . Smokeless tobacco: Never Used     Comment: quit smoking in the 80's  . Alcohol Use: No   OB History    No data available      Review of Systems  Constitutional: Negative for fever and chills.  HENT: Positive for congestion.   Respiratory: Positive for cough and shortness of breath.   Cardiovascular: Positive for leg swelling. Negative for chest pain.  Gastrointestinal: Positive for nausea. Negative for vomiting, abdominal pain, diarrhea and constipation.  Neurological: Negative for dizziness, weakness, light-headedness, numbness and headaches.  All other systems reviewed and are negative.     Allergies  Bidil  Home Medications   Prior to Admission medications   Medication Sig Start Date End Date Taking? Authorizing Provider  acetaminophen (TYLENOL) 325 MG tablet Take 650 mg by mouth every 6 (six) hours as needed for mild pain.   Yes Historical Provider, MD  allopurinol (ZYLOPRIM) 100 MG tablet Take 1 tablet (100 mg total) by mouth daily. 12/08/14  Yes [name], MD  amLODipine (NORVASC) 5 MG tablet Take 1 tablet (5 mg total) by mouth daily. 12/08/14  Yes [name], MD  aspirin 81 MG chewable tablet Chew 81 mg by mouth daily.   Yes Historical Provider, MD  carvedilol (COREG) 3.125 MG tablet Take 3.125 mg by mouth 2 (two) times daily with a meal.  04/29/15  Yes Historical Provider, MD  furosemide (  LASIX) 80 MG tablet Take 1 tablet (80 mg total) by mouth 2 (two) times daily. 05/29/15  Yes Orpah CobbAjay Kadakia, MD  linagliptin (TRADJENTA) 5 MG TABS tablet Take 1 tablet (5 mg total) by mouth daily. 09/29/14  Yes Orpah CobbAjay Kadakia, MD  metolazone (ZAROXOLYN) 5 MG tablet Take 5 mg by mouth daily.   Yes Historical Provider, MD  metoprolol succinate (TOPROL-XL) 25 MG 24 hr tablet Take 1 tablet (25 mg total) by mouth daily. 05/29/15  Yes Orpah CobbAjay Kadakia, MD  nitroGLYCERIN (NITROSTAT) 0.4 MG SL tablet Place  0.4 mg under the tongue every 5 (five) minutes as needed for chest pain. 09/10/12  Yes Orpah CobbAjay Kadakia, MD  potassium chloride (K-DUR,KLOR-CON) 10 MEQ tablet Take 1 tablet (10 mEq total) by mouth daily. 05/29/15  Yes Orpah CobbAjay Kadakia, MD  spironolactone (ALDACTONE) 25 MG tablet Take 25 mg by mouth daily.   Yes Historical Provider, MD    BP 142/78 mmHg  Pulse 72  Temp(Src) 98.2 F (36.8 C) (Oral)  Resp 18  Wt 71.215 kg  SpO2 99% Physical Exam  Constitutional: She is oriented to person, place, and time. She appears well-developed and well-nourished. No distress.  HENT:  Head: Normocephalic and atraumatic.  Right Ear: External ear normal.  Left Ear: External ear normal.  Nose: Nose normal.  Mouth/Throat: Uvula is midline, oropharynx is clear and moist and mucous membranes are normal.  Eyes: Conjunctivae, EOM and lids are normal. Pupils are equal, round, and reactive to light. Right eye exhibits no discharge. Left eye exhibits no discharge. No scleral icterus.  Neck: Normal range of motion. Neck supple.  Cardiovascular: Normal rate, regular rhythm, normal heart sounds, intact distal pulses and normal pulses.   Pulmonary/Chest: Effort normal and breath sounds normal. No respiratory distress. She has no wheezes. She has no rales.  Abdominal: Soft. Normal appearance and bowel sounds are normal. She exhibits no distension and no mass. There is no tenderness. There is no rigidity, no rebound and no guarding.  Musculoskeletal: Normal range of motion. She exhibits edema. She exhibits no tenderness.  2+ pitting edema bilaterally.  Neurological: She is alert and oriented to person, place, and time.  Skin: Skin is warm, dry and intact. No rash noted. She is not diaphoretic. No erythema. No pallor.  Psychiatric: She has a normal mood and affect. Her speech is normal and behavior is normal.  Nursing note and vitals reviewed.   ED Course  Procedures (including critical care time)  Labs Review Labs  Reviewed  BASIC METABOLIC PANEL - Abnormal; Notable for the following:    Glucose, Bld 146 (*)    BUN 63 (*)    Creatinine, Ser 1.88 (*)    GFR calc non Af Amer 27 (*)    GFR calc Af Amer 32 (*)    All other components within normal limits  CBC - Abnormal; Notable for the following:    RBC 5.45 (*)    RDW 16.8 (*)    All other components within normal limits  BRAIN NATRIURETIC PEPTIDE - Abnormal; Notable for the following:    B Natriuretic Peptide 2457.9 (*)    All other components within normal limits  I-STAT TROPOININ, ED  I-STAT TROPOININ, ED    Imaging Review Dg Chest 2 View  06/18/2015  CLINICAL DATA:  Fluid gain, worsening shortness of breath, productive cough for 2 weeks. Bilateral flank pain. EXAM: CHEST  2 VIEW COMPARISON:  05/26/2015. FINDINGS: Trachea is midline. Cardiac silhouette is enlarged, stable. Thoracic aorta is calcified.  No definite edema. No airspace consolidation or pleural fluid. Suspect mild pleural parenchymal scarring at the base of the left hemi thorax. IMPRESSION: No acute findings. Electronically Signed   By: Leanna Battles M.D.   On: 06/18/2015 13:24   I have personally reviewed and evaluated these images and lab results as part of my medical decision-making.   EKG Interpretation   Date/Time:  Thursday June 18 2015 12:04:27 EST Ventricular Rate:  87 PR Interval:  168 QRS Duration: 96 QT Interval:  424 QTC Calculation: 510 R Axis:   149 Text Interpretation:  Sinus rhythm with occasional Premature ventricular  complexes and Fusion complexes Right axis deviation Prolonged QT  - new  Abnormal ECG No significant change since last tracing Confirmed by  NANAVATI, MD, Janey Genta [phone]) on 06/18/2015 4:03:14 PM      MDM   Final diagnoses:  Shortness of breath  Bilateral edema of lower extremity    64 year old female with a history of CHF presents with nasal congestion, cough productive of yellow sputum, and shortness of breath. Reports associated  nausea and lower extremity edema. Denies fever, chills, HA, dizziness, chest pain, abdominal pain, vomiting, diarrhea, constipation, numbness, weakness, paresthesia. States she has been taking her lasix at home and has not missed any doses. Per record review, patient discharged 11/18 after she was admitted for shortness of breath, lower extremity edema, and weight gain. Her weight on discharge was 65 kg, her weight today is 71 kg. Last echo 11/15, EF 25-30%.  Patient is afebrile. Mildly hypertensive. O2 sat 97% on RA. Heart RRR. Lungs clear to auscultation bilaterally. Abdomen soft, non-tender, non-distended. 2+ pitting edema to lower extremities bilaterally.   EKG sinus rhythm with occasional PVCs, right axis deviation, no significant change. Troponin negative. CBC negative for leukocytosis or anemia. BMP remarkable for creatinine elevated at 1.88, which appears chronic. CXR negative for edema, consolidation, pleural fluid. Patient discussed with Dr. Rhunette Croft. Will consult cardiology ([name]). Patient given 80 mg IV lasix.  [name] unavailable, office referred patient to [name]. Spoke with [name], who advised reassessing patient afted IV lasix and if stable, patient appropriate for discharge and outpatient follow-up with [name].  On reassessment, patient states she is feeling better. Will monitor O2 sat while ambulating. Patient has had urine output in the ED s/p lasix administration. O2 sat while ambulating 100%.  Patient is non-toxic and well-appearing. Feel she is stable for discharge at this time. Patient to follow-up with [name] for further evaluation and management. Return precautions discussed. Patient verbalizes her understanding and is in agreement with plan.  BP 142/78 mmHg  Pulse 72  Temp(Src) 98.2 F (36.8 C) (Oral)  Resp 18  Wt 71.215 kg  SpO2 99%       Mady Gemma, PA-C 06/18/15 2326  Derwood Kaplan, MD 06/19/15 0028

## 2015-06-18 NOTE — ED Notes (Signed)
Meal tray ordered for pt  

## 2015-06-18 NOTE — ED Notes (Signed)
Ordered Meal tray @ 1835-per RN

## 2015-06-18 NOTE — ED Notes (Signed)
Pt reports some SOb, fluid gain and cough over the past couple of days. SOb is worse with activity.

## 2015-06-18 NOTE — ED Notes (Signed)
Pt verbalized understanding to monitor fluid intake and to follow up with Dr Algie CofferKadakia. Pt stable NAD upon d/c.

## 2015-06-18 NOTE — Discharge Instructions (Signed)
1. Medications: usual home medications 2. Treatment: rest, monitor fluid intake 3. Follow Up: please followup with Dr. Algie CofferKadakia for discussion of your diagnoses and further evaluation after today's visit; please return to the ER for shortness of breath, increased swelling in your legs, new or worsening symptoms

## 2015-07-03 ENCOUNTER — Inpatient Hospital Stay (HOSPITAL_COMMUNITY)
Admission: AD | Admit: 2015-07-03 | Discharge: 2015-07-13 | DRG: 291 | Disposition: A | Discharge status: Home / Self Care | Payer: 59 | Source: Ambulatory Visit | Attending: Cardiovascular Disease | Admitting: Cardiovascular Disease

## 2015-07-03 DIAGNOSIS — M109 Gout, unspecified: Secondary | ICD-10-CM | POA: Diagnosis present

## 2015-07-03 DIAGNOSIS — F419 Anxiety disorder, unspecified: Secondary | ICD-10-CM | POA: Diagnosis present

## 2015-07-03 DIAGNOSIS — N183 Chronic kidney disease, stage 3 (moderate): Secondary | ICD-10-CM | POA: Diagnosis present

## 2015-07-03 DIAGNOSIS — I5023 Acute on chronic systolic (congestive) heart failure: Secondary | ICD-10-CM | POA: Diagnosis present

## 2015-07-03 DIAGNOSIS — F329 Major depressive disorder, single episode, unspecified: Secondary | ICD-10-CM | POA: Diagnosis present

## 2015-07-03 DIAGNOSIS — Z955 Presence of coronary angioplasty implant and graft: Secondary | ICD-10-CM

## 2015-07-03 DIAGNOSIS — I5082 Biventricular heart failure: Secondary | ICD-10-CM

## 2015-07-03 DIAGNOSIS — I5021 Acute systolic (congestive) heart failure: Secondary | ICD-10-CM | POA: Diagnosis present

## 2015-07-03 DIAGNOSIS — Z79899 Other long term (current) drug therapy: Secondary | ICD-10-CM

## 2015-07-03 DIAGNOSIS — Z87891 Personal history of nicotine dependence: Secondary | ICD-10-CM | POA: Diagnosis not present

## 2015-07-03 DIAGNOSIS — I252 Old myocardial infarction: Secondary | ICD-10-CM | POA: Diagnosis not present

## 2015-07-03 DIAGNOSIS — Z888 Allergy status to other drugs, medicaments and biological substances status: Secondary | ICD-10-CM | POA: Diagnosis not present

## 2015-07-03 DIAGNOSIS — I130 Hypertensive heart and chronic kidney disease with heart failure and stage 1 through stage 4 chronic kidney disease, or unspecified chronic kidney disease: Principal | ICD-10-CM | POA: Diagnosis present

## 2015-07-03 DIAGNOSIS — I2510 Atherosclerotic heart disease of native coronary artery without angina pectoris: Secondary | ICD-10-CM | POA: Diagnosis present

## 2015-07-03 DIAGNOSIS — J209 Acute bronchitis, unspecified: Secondary | ICD-10-CM | POA: Diagnosis present

## 2015-07-03 DIAGNOSIS — E1129 Type 2 diabetes mellitus with other diabetic kidney complication: Secondary | ICD-10-CM

## 2015-07-03 DIAGNOSIS — I509 Heart failure, unspecified: Secondary | ICD-10-CM

## 2015-07-03 DIAGNOSIS — I10 Essential (primary) hypertension: Secondary | ICD-10-CM | POA: Diagnosis present

## 2015-07-03 LAB — CBC WITH DIFFERENTIAL/PLATELET
BASOS ABS: 0 10*3/uL (ref 0.0–0.1)
Basophils Relative: 1 %
Eosinophils Absolute: 0.1 10*3/uL (ref 0.0–0.7)
Eosinophils Relative: 2 %
HEMATOCRIT: 47.6 % — AB (ref 36.0–46.0)
Hemoglobin: 15.4 g/dL — ABNORMAL HIGH (ref 12.0–15.0)
LYMPHS ABS: 1.3 10*3/uL (ref 0.7–4.0)
LYMPHS PCT: 31 %
MCH: 26.6 pg (ref 26.0–34.0)
MCHC: 32.4 g/dL (ref 30.0–36.0)
MCV: 82.4 fL (ref 78.0–100.0)
MONO ABS: 0.6 10*3/uL (ref 0.1–1.0)
MONOS PCT: 15 %
NEUTROS ABS: 2.2 10*3/uL (ref 1.7–7.7)
Neutrophils Relative %: 51 %
Platelets: 220 10*3/uL (ref 150–400)
RBC: 5.78 MIL/uL — ABNORMAL HIGH (ref 3.87–5.11)
RDW: 17.4 % — AB (ref 11.5–15.5)
WBC: 4.3 10*3/uL (ref 4.0–10.5)

## 2015-07-03 LAB — COMPREHENSIVE METABOLIC PANEL
ALT: 12 U/L — ABNORMAL LOW (ref 14–54)
AST: 25 U/L (ref 15–41)
Albumin: 3 g/dL — ABNORMAL LOW (ref 3.5–5.0)
Alkaline Phosphatase: 94 U/L (ref 38–126)
Anion gap: 11 (ref 5–15)
BILIRUBIN TOTAL: 2.2 mg/dL — AB (ref 0.3–1.2)
BUN: 56 mg/dL — AB (ref 6–20)
CO2: 24 mmol/L (ref 22–32)
CREATININE: 1.74 mg/dL — AB (ref 0.44–1.00)
Calcium: 8.8 mg/dL — ABNORMAL LOW (ref 8.9–10.3)
Chloride: 105 mmol/L (ref 101–111)
GFR calc Af Amer: 35 mL/min — ABNORMAL LOW (ref >60)
GFR, EST NON AFRICAN AMERICAN: 30 mL/min — AB (ref >60)
Glucose, Bld: 142 mg/dL — ABNORMAL HIGH (ref 65–99)
POTASSIUM: 3.9 mmol/L (ref 3.5–5.1)
Sodium: 140 mmol/L (ref 135–145)
TOTAL PROTEIN: 7 g/dL (ref 6.5–8.1)

## 2015-07-03 LAB — GLUCOSE, CAPILLARY
Glucose-Capillary: 147 mg/dL — ABNORMAL HIGH (ref 65–99)
Glucose-Capillary: 118 mg/dL — ABNORMAL HIGH (ref 65–99)

## 2015-07-03 LAB — BRAIN NATRIURETIC PEPTIDE: B Natriuretic Peptide: 2382 pg/mL — ABNORMAL HIGH (ref 0.0–100.0)

## 2015-07-03 LAB — TROPONIN I: Troponin I: 0.03 ng/mL (ref <0.031)

## 2015-07-03 MED ORDER — SODIUM CHLORIDE 0.9 % IJ SOLN
3.0000 mL | Freq: Two times a day (BID) | INTRAMUSCULAR | Status: DC
  Administered 2015-07-03 – 2015-07-12 (×14): 3 mL via INTRAVENOUS

## 2015-07-03 MED ORDER — SODIUM CHLORIDE 0.9 % IV SOLN: 250.0000 mL | INTRAVENOUS | Status: DC | PRN

## 2015-07-03 MED ORDER — SODIUM CHLORIDE 0.9 % IJ SOLN: 3.0000 mL | INTRAMUSCULAR | Status: DC | PRN

## 2015-07-03 MED ORDER — ACETAMINOPHEN 325 MG PO TABS
650.0000 mg | ORAL_TABLET | Freq: Four times a day (QID) | ORAL | Status: DC | PRN
  Administered 2015-07-05: 650 mg via ORAL

## 2015-07-03 MED ORDER — ACETAMINOPHEN 325 MG PO TABS
650.0000 mg | ORAL_TABLET | ORAL | Status: DC | PRN
  Filled 2015-07-03 (×2): qty 2

## 2015-07-03 MED ORDER — POTASSIUM CHLORIDE CRYS ER 10 MEQ PO TBCR
10.0000 meq | EXTENDED_RELEASE_TABLET | Freq: Every day | ORAL | Status: DC
  Administered 2015-07-03 – 2015-07-09 (×7): 10 meq via ORAL
  Filled 2015-07-03 (×7): qty 1

## 2015-07-03 MED ORDER — CARVEDILOL 3.125 MG PO TABS
3.1250 mg | ORAL_TABLET | Freq: Two times a day (BID) | ORAL | Status: DC
  Administered 2015-07-03 – 2015-07-06 (×7): 3.125 mg via ORAL
  Filled 2015-07-03 (×7): qty 1

## 2015-07-03 MED ORDER — FUROSEMIDE 10 MG/ML IJ SOLN
40.0000 mg | Freq: Two times a day (BID) | INTRAMUSCULAR | Status: DC
  Administered 2015-07-03 – 2015-07-04 (×3): 40 mg via INTRAVENOUS
  Filled 2015-07-03 (×4): qty 4

## 2015-07-03 MED ORDER — HEPARIN SODIUM (PORCINE) 5000 UNIT/ML IJ SOLN
5000.0000 [IU] | Freq: Three times a day (TID) | INTRAMUSCULAR | Status: DC
  Administered 2015-07-03 – 2015-07-13 (×20): 5000 [IU] via SUBCUTANEOUS
  Filled 2015-07-03 (×26): qty 1

## 2015-07-03 MED ORDER — ALLOPURINOL 100 MG PO TABS
100.0000 mg | ORAL_TABLET | Freq: Every day | ORAL | Status: DC
  Administered 2015-07-03 – 2015-07-13 (×11): 100 mg via ORAL
  Filled 2015-07-03 (×11): qty 1

## 2015-07-03 MED ORDER — ONDANSETRON HCL 4 MG/2ML IJ SOLN: 4.0000 mg | Freq: Four times a day (QID) | INTRAMUSCULAR | Status: DC | PRN

## 2015-07-03 MED ORDER — LINAGLIPTIN 5 MG PO TABS
5.0000 mg | ORAL_TABLET | Freq: Every day | ORAL | Status: DC
  Administered 2015-07-03 – 2015-07-13 (×11): 5 mg via ORAL
  Filled 2015-07-03 (×11): qty 1

## 2015-07-03 MED ORDER — SPIRONOLACTONE 25 MG PO TABS
25.0000 mg | ORAL_TABLET | Freq: Every day | ORAL | Status: DC
  Administered 2015-07-03 – 2015-07-13 (×11): 25 mg via ORAL
  Filled 2015-07-03 (×11): qty 1

## 2015-07-03 MED ORDER — AMLODIPINE BESYLATE 5 MG PO TABS
5.0000 mg | ORAL_TABLET | Freq: Every day | ORAL | Status: DC
Start: 2015-07-03 — End: 2015-07-04
  Administered 2015-07-03 – 2015-07-04 (×2): 5 mg via ORAL
  Filled 2015-07-03 (×2): qty 1

## 2015-07-03 MED ORDER — METOLAZONE 5 MG PO TABS
5.0000 mg | ORAL_TABLET | Freq: Every day | ORAL | Status: DC
  Administered 2015-07-03 – 2015-07-13 (×11): 5 mg via ORAL
  Filled 2015-07-03 (×11): qty 1

## 2015-07-03 NOTE — H&P (Signed)
Referring Physician:   [name] is an 64 y.o. female.                       Chief Complaint: Shortness of breath and leg edema  HPI: 64 year old female presents with leg edema and progressive worsening of shortness of breath and 10 + pound weight gain x 2-3 weeks has past medical history of acute biventricular systolic heart failure, CAD, status post stent in LAD, diabetes mellitus type 2, hypertension and anxiety. No fever or chest pain.   Past Medical History  Diagnosis Date  . Hypertension   . Coronary artery disease   . CHF (congestive heart failure) (HCC)   . Shortness of breath dyspnea   . High cholesterol   . Type II diabetes mellitus (HCC)   . GERD (gastroesophageal reflux disease)   . Gout       Past Surgical History  Procedure Laterality Date  . Left and right heart catheterization with coronary angiogram N/A 11/25/2011    Procedure: LEFT AND RIGHT HEART CATHETERIZATION WITH CORONARY ANGIOGRAM;  Surgeon: Robynn PaneMohan N Harwani, MD;  Location: [facility] CATH LAB;  Service: Cardiovascular;  Laterality: N/A;  . Percutaneous coronary stent intervention (pci-s) Right 11/25/2011    Procedure: PERCUTANEOUS CORONARY STENT INTERVENTION (PCI-S);  Surgeon: Robynn PaneMohan N Harwani, MD;  [facility];  Service: Cardiovascular;  Laterality: Right;  . Left and right heart catheterization with coronary angiogram N/A 09/07/2012    Procedure: LEFT AND RIGHT HEART CATHETERIZATION WITH CORONARY ANGIOGRAM;  Surgeon: Ricki RodriguezAjay S Alaiya Martindelcampo, MD;  Location: [facility] CATH LAB;  Service: Cardiovascular;  Laterality: N/A;  . Tonsillectomy  1950's  . Coronary angioplasty with stent placement  2013  . Cardiac catheterization  2014    No family history on file. Social History:  reports that she has quit smoking. Her smoking use included Cigarettes. She has a 10 pack-year smoking history. She has never used smokeless tobacco. She reports that she does not drink alcohol or use illicit drugs.  Allergies:  Allergies   Allergen Reactions  . Bidil [Isosorb Dinitrate-Hydralazine] Itching    Jittery, feels like something is crawling    Medications Prior to Admission  Medication Sig Dispense Refill  . acetaminophen (TYLENOL) 325 MG tablet Take 650 mg by mouth every 6 (six) hours as needed for mild pain.    Marland Kitchen. allopurinol (ZYLOPRIM) 100 MG tablet Take 1 tablet (100 mg total) by mouth daily.    Marland Kitchen. amLODipine (NORVASC) 5 MG tablet Take 1 tablet (5 mg total) by mouth daily. 30 tablet 1  . aspirin 81 MG chewable tablet Chew 81 mg by mouth daily.    . carvedilol (COREG) 3.125 MG tablet Take 3.125 mg by mouth 2 (two) times daily with a meal.   3  . furosemide (LASIX) 80 MG tablet Take 1 tablet (80 mg total) by mouth 2 (two) times daily.    Marland Kitchen. linagliptin (TRADJENTA) 5 MG TABS tablet Take 1 tablet (5 mg total) by mouth daily. 30 tablet 1  . metolazone (ZAROXOLYN) 5 MG tablet Take 5 mg by mouth daily.    . metoprolol succinate (TOPROL-XL) 25 MG 24 hr tablet Take 1 tablet (25 mg total) by mouth daily. 30 tablet 3  . nitroGLYCERIN (NITROSTAT) 0.4 MG SL tablet Place 0.4 mg under the tongue every 5 (five) minutes as needed for chest pain.    . potassium chloride (K-DUR,KLOR-CON) 10 MEQ tablet Take 1 tablet (10 mEq total) by mouth  daily.    . spironolactone (ALDACTONE) 25 MG tablet Take 25 mg by mouth daily.      No results found for this or any previous visit (from the past 48 hour(s)). No results found.  Review Of Systems Constitutional: Negative for fever and chills. Positive for weight gain from CHF. HENT: Positive for congestion and wears glases and partial dentures.  Respiratory: Positive for cough and shortness of breath, COPD.  Cardiovascular: Positive for leg swelling, Chest pain and hypertension.  Gastrointestinal: Positive for nausea. Negative for vomiting, abdominal pain, diarrhea and constipation.  Neurological: Negative for dizziness, weakness, light-headedness, numbness and headaches.  Extremites:  Arthritis and leg edema All other systems reviewed and are negative.   Blood pressure 157/98, pulse 93, temperature 97.7 F (36.5 C), temperature source Oral, resp. rate 18, height  (1.676 m), weight 72.53 kg (159 lb 14.4 oz), SpO2 98 %. Physical exam: General: Averagely built and nourished in mild respiratory distress. HEENT: [place]/AT, brown eyes, PERL, EOMI. conjuntiva-pink, sclera-white. Neck: + JVD, no bruit, no thyromegaly.  Lungs: Bilateral basal crackles.  Heart: Normal S1 and S2. Grade III/VI systolic murmur LSB  Abdomen: Mild swelling, non-tender.  Ext: 2 + edema up to thighs, bilaterally.  CNS: Cranial nerves grossly intact. Moves all 4 extremities. Skin: Warm and dry.  Assessment/Plan Acute Biventricular systolic heart failure  CAD  S/P Stent in LAD  DM, II  Hypertension  Anxiety  IV lasix. Home medications. R/O MI.  [name], MD  07/03/2015, 2:15 PM

## 2015-07-04 ENCOUNTER — Inpatient Hospital Stay (HOSPITAL_COMMUNITY): Payer: 59

## 2015-07-04 ENCOUNTER — Encounter (HOSPITAL_COMMUNITY): Payer: Self-pay | Admitting: Nurse Practitioner

## 2015-07-04 LAB — GLUCOSE, CAPILLARY
GLUCOSE-CAPILLARY: 123 mg/dL — AB (ref 65–99)
GLUCOSE-CAPILLARY: 178 mg/dL — AB (ref 65–99)
GLUCOSE-CAPILLARY: 143 mg/dL — AB (ref 65–99)
Glucose-Capillary: 122 mg/dL — ABNORMAL HIGH (ref 65–99)

## 2015-07-04 LAB — TROPONIN I: Troponin I: <0.03 ng/mL (ref <0.031)

## 2015-07-04 MED ORDER — DIGOXIN 125 MCG PO TABS
0.1250 mg | ORAL_TABLET | Freq: Every day | ORAL | Status: DC
  Administered 2015-07-04 – 2015-07-13 (×10): 0.125 mg via ORAL
  Filled 2015-07-04 (×11): qty 1

## 2015-07-04 NOTE — Progress Notes (Signed)
Subjective:  Complains of abdominal and leg swelling associated with shortness of breath  Objective:  Vital Signs in the last 24 hours: Temp:  [97.7 F (36.5 C)-97.8 F (36.6 C)] 97.7 F (36.5 C) (12/24 0237) Pulse Rate:  [72-93] 77 (12/24 0843) Resp:  [18-20] 18 (12/24 0604) BP: (114-157)/(71-98) 132/74 mmHg (12/24 0843) SpO2:  [96 %-100 %] 100 % (12/24 0843) Weight:  [159 lb 14.4 oz (72.53 kg)-162 lb 12.8 oz (73.846 kg)] 162 lb 12.8 oz (73.846 kg) (12/24 0604)  Intake/Output from previous day: 12/23 0701 - 12/24 0700 In: 804 [P.O.:804] Out: 800 [Urine:800] Intake/Output from this shift: Total I/O In: 120 [P.O.:120] Out: 0   Physical Exam: Neck: no adenopathy, no carotid bruit, no JVD and supple, symmetrical, trachea midline Lungs: Decreased breath sound at bases with bibasilar rales Heart: regular rate and rhythm, S1, S2 normal and Soft systolic murmur and S3 gallop noted Abdomen: soft, non-tender; bowel sounds normal; no masses,  no organomegaly Extremities: No clubbing cyanosis 3+ edema noted  Lab Results:  Recent Labs  07/03/15 1618  WBC 4.3  HGB 15.4*  PLT 220    Recent Labs  07/03/15 1618  NA 140  K 3.9  CL 105  CO2 24  GLUCOSE 142*  BUN 56*  CREATININE 1.74*    Recent Labs  07/03/15 2004 07/04/15 0209  TROPONINI 0.03 <0.03   Hepatic Function Panel  Recent Labs  07/03/15 1618  PROT 7.0  ALBUMIN 3.0*  AST 25  ALT 12*  ALKPHOS 94  BILITOT 2.2*   No results for input(s): CHOL in the last 72 hours. No results for input(s): PROTIME in the last 72 hours.  Imaging: Imaging results have been reviewed and Dg Chest 2 View  07/04/2015  CLINICAL DATA:  Short of breath, productive cough EXAM: CHEST  2 VIEW COMPARISON:  06/18/2015 FINDINGS: Stable enlarged cardiac silhouette. There is prominence of central pulmonary vascular markings. No overt pulmonary edema. No focal infiltrate. Rounded 8 mm calcific density projecting over the inferior RIGHT  lower lobe is likely a benign calcification. IMPRESSION: Cardiomegaly and central venous congestion. Electronically Signed   By: Genevive BiStewart  Edmunds M.D.   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  LOS: 1 day    Rinaldo CloudHarwani, Kylee Umana 07/04/2015, 12:41 PM

## 2015-07-05 LAB — BASIC METABOLIC PANEL
ANION GAP: 12 (ref 5–15)
BUN: 65 mg/dL — AB (ref 6–20)
CHLORIDE: 102 mmol/L (ref 101–111)
CO2: 24 mmol/L (ref 22–32)
Calcium: 8.9 mg/dL (ref 8.9–10.3)
Creatinine, Ser: 2.33 mg/dL — ABNORMAL HIGH (ref 0.44–1.00)
GFR, EST AFRICAN AMERICAN: 24 mL/min — AB (ref >60)
GFR, EST NON AFRICAN AMERICAN: 21 mL/min — AB (ref >60)
Glucose, Bld: 129 mg/dL — ABNORMAL HIGH (ref 65–99)
POTASSIUM: 4.3 mmol/L (ref 3.5–5.1)
SODIUM: 138 mmol/L (ref 135–145)

## 2015-07-05 LAB — CBC
HEMATOCRIT: 45.1 % (ref 36.0–46.0)
HEMOGLOBIN: 14.2 g/dL (ref 12.0–15.0)
MCH: 25.8 pg — ABNORMAL LOW (ref 26.0–34.0)
MCHC: 31.5 g/dL (ref 30.0–36.0)
MCV: 81.9 fL (ref 78.0–100.0)
Platelets: 240 10*3/uL (ref 150–400)
RBC: 5.51 MIL/uL — AB (ref 3.87–5.11)
RDW: 17.1 % — ABNORMAL HIGH (ref 11.5–15.5)
WBC: 4.2 10*3/uL (ref 4.0–10.5)

## 2015-07-05 LAB — BRAIN NATRIURETIC PEPTIDE: B NATRIURETIC PEPTIDE 5: 966.1 pg/mL — AB (ref 0.0–100.0)

## 2015-07-05 LAB — GLUCOSE, CAPILLARY
GLUCOSE-CAPILLARY: 100 mg/dL — AB (ref 65–99)
GLUCOSE-CAPILLARY: 147 mg/dL — AB (ref 65–99)
GLUCOSE-CAPILLARY: 157 mg/dL — AB (ref 65–99)

## 2015-07-05 MED ORDER — FUROSEMIDE 10 MG/ML IJ SOLN
40.0000 mg | Freq: Every day | INTRAMUSCULAR | Status: DC
  Administered 2015-07-06 – 2015-07-13 (×8): 40 mg via INTRAVENOUS
  Filled 2015-07-05 (×8): qty 4

## 2015-07-05 NOTE — Progress Notes (Signed)
Spoke with Dr. Sharyn LullHarwani this AM regarding increase in creatinine. Ordered to hold dose of lasix for now, Pt notified.

## 2015-07-05 NOTE — Progress Notes (Signed)
Pharmacist Heart Failure Core Measure Documentation  Assessment: Tanya SpikeMargaret A [name] has an EF documented as 6225 -30% on November, 2016 by Echo.  Rationale: Heart failure patients with left ventricular systolic dysfunction (LVSD) and an EF < 40% should be prescribed an angiotensin converting enzyme inhibitor (ACEI) or angiotensin receptor blocker (ARB) at discharge unless a contraindication is documented in the medical record.  This patient is not currently on an ACEI or ARB for HF.  This note is being placed in the record in order to provide documentation that a contraindication to the use of these agents is present for this encounter.  ACE Inhibitor or Angiotensin Receptor Blocker is contraindicated (specify all that apply)  []   ACEI allergy AND ARB allergy []   Angioedema []   Moderate or severe aortic stenosis []   Hyperkalemia []   Hypotension []   Renal artery stenosis [x]   Worsening renal function, preexisting renal disease or dysfunction   Elwin Sleightowell, Culley Hedeen Kay 07/05/2015 10:35 AM

## 2015-07-05 NOTE — Progress Notes (Signed)
Subjective:  Patient denies any chest pain states breathing has improved. Also abdominal and leg swelling slightly improved. Able to walk better. Creatinine trending up Lasix dose was held this morning.  Objective:  Vital Signs in the last 24 hours: Temp:  [97.2 F (36.2 C)-97.4 F (36.3 C)] 97.3 F (36.3 C) (12/25 0454) Pulse Rate:  [68-77] 70 (12/25 0454) Resp:  [16-20] 16 (12/25 0454) BP: (119-135)/(73-81) 135/78 mmHg (12/25 0454) SpO2:  [96 %-100 %] 98 % (12/25 0454) Weight:  [161 lb 9.6 oz (73.301 kg)] 161 lb 9.6 oz (73.301 kg) (12/25 0454)  Intake/Output from previous day: 12/24 0701 - 12/25 0700 In: 240 [P.O.:240] Out: 350 [Urine:350] Intake/Output from this shift:    Physical Exam: Neck: no adenopathy, no carotid bruit, no JVD and supple, symmetrical, trachea midline Lungs: Decreased breath sound at bases air entry improved Heart: regular rate and rhythm, S1, S2 normal and Soft systolic murmur and S3 gallop noted Abdomen: soft, non-tender; bowel sounds normal; no masses,  no organomegaly Extremities: No clubbing cyanosis 2+ edema noted  Lab Results:  Recent Labs  07/03/15 1618 07/05/15 0453  WBC 4.3 4.2  HGB 15.4* 14.2  PLT 220 240    Recent Labs  07/03/15 1618 07/05/15 0453  NA 140 138  K 3.9 4.3  CL 105 102  CO2 24 24  GLUCOSE 142* 129*  BUN 56* 65*  CREATININE 1.74* 2.33*    Recent Labs  07/03/15 2004 07/04/15 0209  TROPONINI 0.03 <0.03   Hepatic Function Panel  Recent Labs  07/03/15 1618  PROT 7.0  ALBUMIN 3.0*  AST 25  ALT 12*  ALKPHOS 94  BILITOT 2.2*   No results for input(s): CHOL in the last 72 hours. No results for input(s): PROTIME in the last 72 hours.  Imaging: Imaging results have been reviewed and Dg Chest 2 View  07/04/2015  CLINICAL DATA:  Short of breath, productive cough EXAM: CHEST  2 VIEW COMPARISON:  06/18/2015 FINDINGS: Stable enlarged cardiac silhouette. There is prominence of central pulmonary vascular  markings. No overt pulmonary edema. No focal infiltrate. Rounded 8 mm calcific density projecting over the inferior RIGHT lower lobe is likely a benign calcification. IMPRESSION: Cardiomegaly and central venous congestion. Electronically Signed   By: Genevive BiStewart  Edmunds M.D.   On: 07/04/2015 08:50    Cardiac Studies:  Assessment/Plan:  Resolving Acute biventricular systolic heart failure Coronary artery disease history of anteroseptal wall MI in the past Status post PCI to proximal LAD Hypertension Diabetes mellitus Acute on chronic kidney disease stage III Anxiety disorder Plan Reduce Lasix to a 40 mg daily Restrict fluid to 1 L per 24 hours Daily weights   LOS: 2 days    Rinaldo CloudHarwani, Jaryiah Mehlman 07/05/2015, 8:07 AM

## 2015-07-06 LAB — CBC
HEMATOCRIT: 46.8 % — AB (ref 36.0–46.0)
Hemoglobin: 15.1 g/dL — ABNORMAL HIGH (ref 12.0–15.0)
MCH: 26.2 pg (ref 26.0–34.0)
MCHC: 32.3 g/dL (ref 30.0–36.0)
MCV: 81.1 fL (ref 78.0–100.0)
PLATELETS: 243 10*3/uL (ref 150–400)
RBC: 5.77 MIL/uL — ABNORMAL HIGH (ref 3.87–5.11)
RDW: 16.9 % — AB (ref 11.5–15.5)
WBC: 3.7 10*3/uL — AB (ref 4.0–10.5)

## 2015-07-06 LAB — BASIC METABOLIC PANEL
Anion gap: 12 (ref 5–15)
BUN: 67 mg/dL — AB (ref 6–20)
CHLORIDE: 104 mmol/L (ref 101–111)
CO2: 23 mmol/L (ref 22–32)
CREATININE: 2.45 mg/dL — AB (ref 0.44–1.00)
Calcium: 8.8 mg/dL — ABNORMAL LOW (ref 8.9–10.3)
GFR, EST AFRICAN AMERICAN: 23 mL/min — AB (ref >60)
GFR, EST NON AFRICAN AMERICAN: 20 mL/min — AB (ref >60)
GLUCOSE: 133 mg/dL — AB (ref 65–99)
Potassium: 4.1 mmol/L (ref 3.5–5.1)
SODIUM: 139 mmol/L (ref 135–145)

## 2015-07-06 LAB — BRAIN NATRIURETIC PEPTIDE: B Natriuretic Peptide: 1290.5 pg/mL — ABNORMAL HIGH (ref 0.0–100.0)

## 2015-07-06 MED ORDER — DOPAMINE-DEXTROSE 3.2-5 MG/ML-% IV SOLN
2.5000 ug/kg/min | INTRAVENOUS | Status: DC
  Administered 2015-07-06 – 2015-07-12 (×3): 2.5 ug/kg/min via INTRAVENOUS
  Filled 2015-07-06 (×3): qty 250

## 2015-07-06 MED ORDER — CARVEDILOL 3.125 MG PO TABS
3.1250 mg | ORAL_TABLET | Freq: Two times a day (BID) | ORAL | Status: DC
  Administered 2015-07-07 – 2015-07-13 (×13): 3.125 mg via ORAL
  Filled 2015-07-06 (×13): qty 1

## 2015-07-06 NOTE — Progress Notes (Signed)
Pharmacist Heart Failure Core Measure Documentation  Assessment: [name] has an EF documented as 25-30% on 05/26/15 by ECHO.  Rationale: Heart failure patients with left ventricular systolic dysfunction (LVSD) and an EF < 40% should be prescribed an angiotensin converting enzyme inhibitor (ACEI) or angiotensin receptor blocker (ARB) at discharge unless a contraindication is documented in the medical record.  This patient is not currently on an ACEI or ARB for HF.  This note is being placed in the record in order to provide documentation that a contraindication to the use of these agents is present for this encounter.  ACE Inhibitor or Angiotensin Receptor Blocker is contraindicated (specify all that apply)  []   ACEI allergy AND ARB allergy []   Angioedema []   Moderate or severe aortic stenosis []   Hyperkalemia []   Hypotension []   Renal artery stenosis [x]   Worsening renal function, preexisting renal disease or dysfunction   Vinnie LevelBenjamin Tino Ronan, PharmD., BCPS Clinical Pharmacist Pager [phone]

## 2015-07-06 NOTE — Care Management Note (Signed)
Case Management Note  Patient Details  Name: [name] MRN: [id number] Date of Birth: 05-27-1951  Subjective/Objective:                    Action/Plan: Patient was admitted with worsening shortness of breath and leg swelling. Admitted for CHF. Lives at home with spouse. Will follow for discharge needs.  Expected Discharge Date:  07/06/15               Expected Discharge Plan:     In-House Referral:     Discharge planning Services     Post Acute Care Choice:    Choice offered to:     DME Arranged:    DME Agency:     HH Arranged:    HH Agency:     Status of Service:  In process, will continue to follow  Medicare Important Message Given:    Date Medicare IM Given:    Medicare IM give by:    Date Additional Medicare IM Given:    Additional Medicare Important Message give by:     If discussed at Long Length of Stay Meetings, dates discussed:    Additional Comments:  [name], Tanya Pinard C, RN 07/06/2015, 1:57 PM [phone]

## 2015-07-06 NOTE — Progress Notes (Signed)
Ref: Adley Castello S, MD   Subjective:  Mild worsening of renal function. Not diuresing as expected. Some nausea. Afebrile.  Objective:  Vital Signs in the last 24 hours: Temp:  [97.3 F (36.3 C)-98.6 F (37 C)] 97.5 F (36.4 C) (12/26 0542) Pulse Rate:  [56-68] 56 (12/26 0542) Cardiac Rhythm:  [-] Normal sinus rhythm (12/26 0700) Resp:  [18-20] 18 (12/26 0542) BP: (114-138)/(65-80) 114/65 mmHg (12/26 0542) SpO2:  [98 %-100 %] 100 % (12/26 0542) Weight:  [74.299 kg (163 lb 12.8 oz)] 74.299 kg (163 lb 12.8 oz) (12/26 0542)  Physical Exam: BP Readings from Last 1 Encounters:  07/06/15 114/65    Wt Readings from Last 1 Encounters:  07/06/15 74.299 kg (163 lb 12.8 oz)    Weight change: 0.998 kg (2 lb 3.2 oz)  HEENT: [place]/AT, Eyes-Brown, PERL, EOMI, Conjunctiva-Pink, Sclera-Non-icteric Neck: + JVD, No bruit, Trachea midline. Lungs:  Clearing, Bilateral. Cardiac:  Regular rhythm, normal S1 and S2, no S3. II/VI systolic murmur. Abdomen:  Soft, non-tender. Extremities:  2 + edema up to thigh present bilaterally. No cyanosis. No clubbing. CNS: AxOx3, Cranial nerves grossly intact, moves all 4 extremities. Right handed. Skin: Warm and dry.   Intake/Output from previous day: 12/25 0701 - 12/26 0700 In: 840 [P.O.:840] Out: 550 [Urine:550]    Lab Results: BMET    Component Value Date/Time   NA 139 07/06/2015 0555   NA 138 07/05/2015 0453   NA 140 07/03/2015 1618   K 4.1 07/06/2015 0555   K 4.3 07/05/2015 0453   K 3.9 07/03/2015 1618   CL 104 07/06/2015 0555   CL 102 07/05/2015 0453   CL 105 07/03/2015 1618   CO2 23 07/06/2015 0555   CO2 24 07/05/2015 0453   CO2 24 07/03/2015 1618   GLUCOSE 133* 07/06/2015 0555   GLUCOSE 129* 07/05/2015 0453   GLUCOSE 142* 07/03/2015 1618   BUN 67* 07/06/2015 0555   BUN 65* 07/05/2015 0453   BUN 56* 07/03/2015 1618   CREATININE 2.45* 07/06/2015 0555   CREATININE 2.33* 07/05/2015 0453   CREATININE 1.74* 07/03/2015 1618   CALCIUM 8.8*  07/06/2015 0555   CALCIUM 8.9 07/05/2015 0453   CALCIUM 8.8* 07/03/2015 1618   GFRNONAA 20* 07/06/2015 0555   GFRNONAA 21* 07/05/2015 0453   GFRNONAA 30* 07/03/2015 1618   GFRAA 23* 07/06/2015 0555   GFRAA 24* 07/05/2015 0453   GFRAA 35* 07/03/2015 1618   CBC    Component Value Date/Time   WBC 3.7* 07/06/2015 0555   RBC 5.77* 07/06/2015 0555   HGB 15.1* 07/06/2015 0555   HCT 46.8* 07/06/2015 0555   PLT 243 07/06/2015 0555   MCV 81.1 07/06/2015 0555   MCH 26.2 07/06/2015 0555   MCHC 32.3 07/06/2015 0555   RDW 16.9* 07/06/2015 0555   LYMPHSABS 1.3 07/03/2015 1618   MONOABS 0.6 07/03/2015 1618   EOSABS 0.1 07/03/2015 1618   BASOSABS 0.0 07/03/2015 1618   HEPATIC Function Panel  Recent Labs  04/01/15 1630 05/25/15 1634 07/03/15 1618  PROT 6.5 7.2 7.0   HEMOGLOBIN A1C No components found for: HGA1C,  MPG CARDIAC ENZYMES Lab Results  Component Value Date   CKTOTAL 59 09/07/2012   CKMB 3.2 09/07/2012   TROPONINI <0.03 07/04/2015   TROPONINI 0.03 07/03/2015   TROPONINI <0.03 07/03/2015   BNP No results for input(s): PROBNP in the last 8760 hours. TSH  Recent Labs  02/01/15 1020  TSH 2.961   CHOLESTEROL  Recent Labs  05/28/15 0654  CHOL 157  Scheduled Meds: . allopurinol  100 mg Oral Daily  . carvedilol  3.125 mg Oral BID WC  . digoxin  0.125 mg Oral Daily  . furosemide  40 mg Intravenous Daily  . heparin  5,000 Units Subcutaneous 3 times per day  . linagliptin  5 mg Oral Daily  . metolazone  5 mg Oral Daily  . potassium chloride  10 mEq Oral Daily  . sodium chloride  3 mL Intravenous Q12H  . spironolactone  25 mg Oral Daily   Continuous Infusions:  PRN Meds:.sodium chloride, acetaminophen, acetaminophen, ondansetron (ZOFRAN) IV, sodium chloride  Assessment/Plan: Acute Biventricular systolic heart failure  CAD  S/P Stent in LAD  DM, II  Hypertension  Anxiety   Add dopamine for inotropism.   LOS: 3 days    [name]  MD   07/06/2015, 10:17 AM

## 2015-07-07 MED ORDER — MAGNESIUM HYDROXIDE 400 MG/5ML PO SUSP
30.0000 mL | Freq: Every day | ORAL | Status: DC | PRN
  Administered 2015-07-07: 30 mL via ORAL
  Filled 2015-07-07: qty 30

## 2015-07-07 NOTE — Progress Notes (Signed)
Ref: [name],Rosalie Gelpi S, MD   Subjective:  Somewhat depressed. Improved diuresis with dopamine use. Afebrile.  Objective:  Vital Signs in the last 24 hours: Temp:  [97.7 F (36.5 C)-98.7 F (37.1 C)] 98.7 F (37.1 C) (12/27 2023) Pulse Rate:  [58-74] 74 (12/27 2023) Cardiac Rhythm:  [-] Normal sinus rhythm (12/27 1916) Resp:  [18] 18 (12/27 2023) BP: (115-149)/(56-91) 115/56 mmHg (12/27 2023) SpO2:  [96 %-100 %] 100 % (12/27 2023) Weight:  [72 kg (158 lb 11.7 oz)] 72 kg (158 lb 11.7 oz) (12/27 16100628)  Physical Exam: BP Readings from Last 1 Encounters:  07/07/15 115/56    Wt Readings from Last 1 Encounters:  07/07/15 72 kg (158 lb 11.7 oz)    Weight change: -2.299 kg (-5 lb 1.1 oz)  HEENT: [name]/AT, Eyes-Brown, PERL, EOMI, Conjunctiva-Pink, Sclera-Non-icteric Neck: + JVD, No bruit, Trachea midline. Lungs:  Clear, Bilateral. Cardiac:  Regular rhythm, normal S1 and S2, no S3. II/VI systolic murmur. Abdomen:  Soft, non-tender. Extremities:  2 + edema present. No cyanosis. No clubbing. CNS: AxOx3, Cranial nerves grossly intact, moves all 4 extremities. Right handed. Skin: Warm and dry.   Intake/Output from previous day: 12/26 0701 - 12/27 0700 In: 820.5 [P.O.:760; I.V.:60.5] Out: -     Lab Results: BMET    Component Value Date/Time   NA 139 07/06/2015 0555   NA 138 07/05/2015 0453   NA 140 07/03/2015 1618   K 4.1 07/06/2015 0555   K 4.3 07/05/2015 0453   K 3.9 07/03/2015 1618   CL 104 07/06/2015 0555   CL 102 07/05/2015 0453   CL 105 07/03/2015 1618   CO2 23 07/06/2015 0555   CO2 24 07/05/2015 0453   CO2 24 07/03/2015 1618   GLUCOSE 133* 07/06/2015 0555   GLUCOSE 129* 07/05/2015 0453   GLUCOSE 142* 07/03/2015 1618   BUN 67* 07/06/2015 0555   BUN 65* 07/05/2015 0453   BUN 56* 07/03/2015 1618   CREATININE 2.45* 07/06/2015 0555   CREATININE 2.33* 07/05/2015 0453   CREATININE 1.74* 07/03/2015 1618   CALCIUM 8.8* 07/06/2015 0555   CALCIUM 8.9 07/05/2015 0453   CALCIUM 8.8* 07/03/2015 1618   GFRNONAA 20* 07/06/2015 0555   GFRNONAA 21* 07/05/2015 0453   GFRNONAA 30* 07/03/2015 1618   GFRAA 23* 07/06/2015 0555   GFRAA 24* 07/05/2015 0453   GFRAA 35* 07/03/2015 1618   CBC    Component Value Date/Time   WBC 3.7* 07/06/2015 0555   RBC 5.77* 07/06/2015 0555   HGB 15.1* 07/06/2015 0555   HCT 46.8* 07/06/2015 0555   PLT 243 07/06/2015 0555   MCV 81.1 07/06/2015 0555   MCH 26.2 07/06/2015 0555   MCHC 32.3 07/06/2015 0555   RDW 16.9* 07/06/2015 0555   LYMPHSABS 1.3 07/03/2015 1618   MONOABS 0.6 07/03/2015 1618   EOSABS 0.1 07/03/2015 1618   BASOSABS 0.0 07/03/2015 1618   HEPATIC Function Panel  Recent Labs  04/01/15 1630 05/25/15 1634 07/03/15 1618  PROT 6.5 7.2 7.0   HEMOGLOBIN A1C No components found for: HGA1C,  MPG CARDIAC ENZYMES Lab Results  Component Value Date   CKTOTAL 59 09/07/2012   CKMB 3.2 09/07/2012   TROPONINI <0.03 07/04/2015   TROPONINI 0.03 07/03/2015   TROPONINI <0.03 07/03/2015   BNP No results for input(s): PROBNP in the last 8760 hours. TSH  Recent Labs  02/01/15 1020  TSH 2.961   CHOLESTEROL  Recent Labs  05/28/15 0654  CHOL 157    Scheduled Meds: . allopurinol  100 mg Oral Daily  . carvedilol  3.125 mg Oral BID WC  . digoxin  0.125 mg Oral Daily  . furosemide  40 mg Intravenous Daily  . heparin  5,000 Units Subcutaneous 3 times per day  . linagliptin  5 mg Oral Daily  . metolazone  5 mg Oral Daily  . potassium chloride  10 mEq Oral Daily  . sodium chloride  3 mL Intravenous Q12H  . spironolactone  25 mg Oral Daily   Continuous Infusions: . DOPamine 2.5 mcg/kg/min (07/06/15 1243)   PRN Meds:.sodium chloride, acetaminophen, acetaminophen, magnesium hydroxide, ondansetron (ZOFRAN) IV, sodium chloride  Assessment/Plan: Acute Biventricular systolic heart failure  CAD  S/P Stent in LAD  DM, II  Hypertension  Anxiety and depression  Continue medical treatment. Psych  consult.   LOS: 4 days    [name]  MD  07/07/2015, 10:37 PM

## 2015-07-07 NOTE — Evaluation (Signed)
Occupational Therapy Evaluation Patient Details Name: Tanya SpikeMargaret A [name] MRN: [id number] DOB: January 26, 1951 Today's Date: 07/07/2015    History of Present Illness 64 yo female admitted with SOB and L LE edema PMH: CHF MI CAD   Clinical Impression   Patient evaluated by Occupational Therapy with no further acute OT needs identified. All education has been completed and the patient has no further questions. See below for any follow-up Occupational Therapy or equipment needs. OT to sign off. Thank you for referral.      Follow Up Recommendations  No OT follow up    Equipment Recommendations  None recommended by OT    Recommendations for Other Services       Precautions / Restrictions Precautions Precautions: None Restrictions Weight Bearing Restrictions: No      Mobility Bed Mobility               General bed mobility comments: in chair on arrival  Transfers Overall transfer level: Independent                    Balance Overall balance assessment: Independent                                          ADL Overall ADL's : Independent                                       General ADL Comments: Pt fully dressed on arrival. completed toileting with urgency during session independent. Pt ambulated  ~50 ft independent oxygen saturations RA 98 %Educated on energy conservation upon d/c. Pt states "i do all that " in response to education     Vision Vision Assessment?: No apparent visual deficits   Perception     Praxis      Pertinent Vitals/Pain Pain Assessment: No/denies pain     Hand Dominance Right   Extremity/Trunk Assessment Upper Extremity Assessment Upper Extremity Assessment: Overall WFL for tasks assessed   Lower Extremity Assessment Lower Extremity Assessment: Overall WFL for tasks assessed   Cervical / Trunk Assessment Cervical / Trunk Assessment: Normal   Communication Communication Communication: No  difficulties   Cognition Arousal/Alertness: Awake/alert Behavior During Therapy: WFL for tasks assessed/performed Overall Cognitive Status: Within Functional Limits for tasks assessed                     General Comments       Exercises       Shoulder Instructions      Home Living Family/patient expects to be discharged to:: Private residence Living Arrangements: Spouse/significant other Available Help at Discharge: Family;Available 24 hours/day Type of Home: House Home Access: Level entry     Home Layout: One level     Bathroom Shower/Tub: Tub/shower unit Shower/tub characteristics: Door Bathroom Toilet: Standard     Home Equipment: None          Prior Functioning/Environment Level of Independence: Independent        Comments: helps provide care for husband with amputation and granddaughter 7 days a week    OT Diagnosis:     OT Problem List:     OT Treatment/Interventions:      OT Goals(Current goals can be found in the care plan section)    OT Frequency:  Barriers to D/C:            Co-evaluation              End of Session Equipment Utilized During Treatment: Gait belt Nurse Communication: Mobility status;Precautions  Activity Tolerance: Patient tolerated treatment well Patient left: in chair;with call bell/phone within reach   Time: 1043-1100 OT Time Calculation (min): 17 min Charges:  OT General Charges $OT Visit: 1 Procedure OT Evaluation $Initial OT Evaluation Tier I: 1 Procedure G-Codes:    Boone Master B 07/08/2015, 11:11 AM   Mateo Flow   OTR/L Pager: [phone] Office: [phone] .

## 2015-07-07 NOTE — Progress Notes (Signed)
PT Screen Note  Patient Details Name: Tanya SpikeMargaret A Peron MRN: [id number] DOB: Feb 23, 1951   Cancelled Treatment:    Reason Eval/Treat Not Completed: PT screened, no needs identified, will sign off.  Screened by OT - patient currently at baseline with no PT needs.     Olivia CanterMoton, Sheila Ocasio M 07/07/2015, 11:01 AM

## 2015-07-07 NOTE — Progress Notes (Addendum)
OT NOTE  Pt requesting to speak to a psychologist regarding home life, current health and does not want her family to know of this request.    Mateo FlowJones, Brynn   OTR/L Pager: [phone] Office: [phone] .

## 2015-07-08 LAB — BASIC METABOLIC PANEL
ANION GAP: 12 (ref 5–15)
BUN: 59 mg/dL — ABNORMAL HIGH (ref 6–20)
CALCIUM: 9.1 mg/dL (ref 8.9–10.3)
CO2: 25 mmol/L (ref 22–32)
CREATININE: 1.71 mg/dL — AB (ref 0.44–1.00)
Chloride: 102 mmol/L (ref 101–111)
GFR, EST AFRICAN AMERICAN: 36 mL/min — AB (ref >60)
GFR, EST NON AFRICAN AMERICAN: 31 mL/min — AB (ref >60)
Glucose, Bld: 165 mg/dL — ABNORMAL HIGH (ref 65–99)
Potassium: 4.1 mmol/L (ref 3.5–5.1)
SODIUM: 139 mmol/L (ref 135–145)

## 2015-07-08 LAB — GLUCOSE, CAPILLARY: Glucose-Capillary: 193 mg/dL — ABNORMAL HIGH (ref 65–99)

## 2015-07-08 NOTE — Progress Notes (Signed)
Ref: [name],Jaisha Villacres S, MD   Subjective:  Feeling little better. Improved renal function. Afebrile.  Objective:  Vital Signs in the last 24 hours: Temp:  [97.8 F (36.6 C)-98.7 F (37.1 C)] 98.5 F (36.9 C) (12/28 0919) Pulse Rate:  [57-75] 57 (12/28 0926) Cardiac Rhythm:  [-] Normal sinus rhythm (12/28 0718) Resp:  [16-18] 18 (12/28 0919) BP: (115-144)/(56-91) 133/56 mmHg (12/28 0926) SpO2:  [97 %-100 %] 100 % (12/28 0926) Weight:  [71.124 kg (156 lb 12.8 oz)] 71.124 kg (156 lb 12.8 oz) (12/28 0514)  Physical Exam: BP Readings from Last 1 Encounters:  07/08/15 133/56    Wt Readings from Last 1 Encounters:  07/08/15 71.124 kg (156 lb 12.8 oz)    Weight change: -0.876 kg (-1 lb 14.9 oz)  HEENT: [place]/AT, Eyes-Brown, PERL, EOMI, Conjunctiva-Pink, Sclera-Non-icteric Neck: + JVD, No bruit, Trachea midline. Lungs:  Clear, Bilateral. Cardiac:  Regular rhythm, normal S1 and S2, no S3. II/VI systolic murmur. Abdomen:  Soft, non-tender. Extremities:  2 + lower lag and presacral edema present. No cyanosis. No clubbing. CNS: AxOx3, Cranial nerves grossly intact, moves all 4 extremities. Right handed. Skin: Warm and dry.   Intake/Output from previous day: 12/27 0701 - 12/28 0700 In: 720 [P.O.:720] Out: 3350 [Urine:3350]    Lab Results: BMET    Component Value Date/Time   NA 139 07/08/2015 0320   NA 139 07/06/2015 0555   NA 138 07/05/2015 0453   K 4.1 07/08/2015 0320   K 4.1 07/06/2015 0555   K 4.3 07/05/2015 0453   CL 102 07/08/2015 0320   CL 104 07/06/2015 0555   CL 102 07/05/2015 0453   CO2 25 07/08/2015 0320   CO2 23 07/06/2015 0555   CO2 24 07/05/2015 0453   GLUCOSE 165* 07/08/2015 0320   GLUCOSE 133* 07/06/2015 0555   GLUCOSE 129* 07/05/2015 0453   BUN 59* 07/08/2015 0320   BUN 67* 07/06/2015 0555   BUN 65* 07/05/2015 0453   CREATININE 1.71* 07/08/2015 0320   CREATININE 2.45* 07/06/2015 0555   CREATININE 2.33* 07/05/2015 0453   CALCIUM 9.1 07/08/2015 0320   CALCIUM 8.8* 07/06/2015 0555   CALCIUM 8.9 07/05/2015 0453   GFRNONAA 31* 07/08/2015 0320   GFRNONAA 20* 07/06/2015 0555   GFRNONAA 21* 07/05/2015 0453   GFRAA 36* 07/08/2015 0320   GFRAA 23* 07/06/2015 0555   GFRAA 24* 07/05/2015 0453   CBC    Component Value Date/Time   WBC 3.7* 07/06/2015 0555   RBC 5.77* 07/06/2015 0555   HGB 15.1* 07/06/2015 0555   HCT 46.8* 07/06/2015 0555   PLT 243 07/06/2015 0555   MCV 81.1 07/06/2015 0555   MCH 26.2 07/06/2015 0555   MCHC 32.3 07/06/2015 0555   RDW 16.9* 07/06/2015 0555   LYMPHSABS 1.3 07/03/2015 1618   MONOABS 0.6 07/03/2015 1618   EOSABS 0.1 07/03/2015 1618   BASOSABS 0.0 07/03/2015 1618   HEPATIC Function Panel  Recent Labs  04/01/15 1630 05/25/15 1634 07/03/15 1618  PROT 6.5 7.2 7.0   HEMOGLOBIN A1C No components found for: HGA1C,  MPG CARDIAC ENZYMES Lab Results  Component Value Date   CKTOTAL 59 09/07/2012   CKMB 3.2 09/07/2012   TROPONINI <0.03 07/04/2015   TROPONINI 0.03 07/03/2015   TROPONINI <0.03 07/03/2015   BNP No results for input(s): PROBNP in the last 8760 hours. TSH  Recent Labs  02/01/15 1020  TSH 2.961   CHOLESTEROL  Recent Labs  05/28/15 0654  CHOL 157    Scheduled Meds: .  allopurinol  100 mg Oral Daily  . carvedilol  3.125 mg Oral BID WC  . digoxin  0.125 mg Oral Daily  . furosemide  40 mg Intravenous Daily  . heparin  5,000 Units Subcutaneous 3 times per day  . linagliptin  5 mg Oral Daily  . metolazone  5 mg Oral Daily  . potassium chloride  10 mEq Oral Daily  . sodium chloride  3 mL Intravenous Q12H  . spironolactone  25 mg Oral Daily   Continuous Infusions: . DOPamine 2.5 mcg/kg/min (07/06/15 1243)   PRN Meds:.sodium chloride, acetaminophen, acetaminophen, magnesium hydroxide, ondansetron (ZOFRAN) IV, sodium chloride  Assessment/Plan: Acute Biventricular systolic heart failure  CAD  S/P Stent in LAD  DM, II  Hypertension  Anxiety and depression  Continue  medications. Refuses anti-depression medication.     LOS: 5 days    [name]  MD  07/08/2015, 10:41 AM

## 2015-07-08 NOTE — Progress Notes (Signed)
Pt a/o, no c/o pain, pt remains on dopamine gtt @ 3.5 ml/hr, VSS, pt stable

## 2015-07-09 LAB — GLUCOSE, CAPILLARY: GLUCOSE-CAPILLARY: 124 mg/dL — AB (ref 65–99)

## 2015-07-09 NOTE — Progress Notes (Signed)
Ref: Tanya RodriguezKADAKIA,[name], MD   Subjective:  Steady diuresis. Less depressed. Afebrile  Objective:  Vital Signs in the last 24 hours: Temp:  [98 F (36.7 C)-98.9 F (37.2 C)] 98.9 F (37.2 C) (12/29 1957) Pulse Rate:  [58-74] 74 (12/29 1957) Cardiac Rhythm:  [-] Normal sinus rhythm (12/29 1906) Resp:  [18] 18 (12/29 1957) BP: (137-159)/(58-86) 159/86 mmHg (12/29 1957) SpO2:  [95 %-100 %] 100 % (12/29 1957) Weight:  [69.491 kg (153 lb 3.2 oz)] 69.491 kg (153 lb 3.2 oz) (12/29 0438)  Physical Exam: BP Readings from Last 1 Encounters:  07/09/15 159/86    Wt Readings from Last 1 Encounters:  07/09/15 69.491 kg (153 lb 3.2 oz)    Weight change: -1.633 kg (-3 lb 9.6 oz)  HEENT: [place]/AT, Eyes-Brown, PERL, EOMI, Conjunctiva-Pink, Sclera-Non-icteric Neck: + JVD, No bruit, Trachea midline. Lungs:  Clear, Bilateral. Cardiac:  Regular rhythm, normal S1 and S2, no S3. II/VI systolic murmur. Abdomen:  Soft, non-tender. Extremities:  2 _ edema present. No cyanosis. No clubbing. CNS: AxOx3, Cranial nerves grossly intact, moves all 4 extremities. Right handed. Skin: Warm and dry.   Intake/Output from previous day: 12/28 0701 - 12/29 0700 In: 470 [P.O.:470] Out: 3350 [Urine:3350]    Lab Results: BMET    Component Value Date/Time   NA 139 07/08/2015 0320   NA 139 07/06/2015 0555   NA 138 07/05/2015 0453   K 4.1 07/08/2015 0320   K 4.1 07/06/2015 0555   K 4.3 07/05/2015 0453   CL 102 07/08/2015 0320   CL 104 07/06/2015 0555   CL 102 07/05/2015 0453   CO2 25 07/08/2015 0320   CO2 23 07/06/2015 0555   CO2 24 07/05/2015 0453   GLUCOSE 165* 07/08/2015 0320   GLUCOSE 133* 07/06/2015 0555   GLUCOSE 129* 07/05/2015 0453   BUN 59* 07/08/2015 0320   BUN 67* 07/06/2015 0555   BUN 65* 07/05/2015 0453   CREATININE 1.71* 07/08/2015 0320   CREATININE 2.45* 07/06/2015 0555   CREATININE 2.33* 07/05/2015 0453   CALCIUM 9.1 07/08/2015 0320   CALCIUM 8.8* 07/06/2015 0555   CALCIUM 8.9  07/05/2015 0453   GFRNONAA 31* 07/08/2015 0320   GFRNONAA 20* 07/06/2015 0555   GFRNONAA 21* 07/05/2015 0453   GFRAA 36* 07/08/2015 0320   GFRAA 23* 07/06/2015 0555   GFRAA 24* 07/05/2015 0453   CBC    Component Value Date/Time   WBC 3.7* 07/06/2015 0555   RBC 5.77* 07/06/2015 0555   HGB 15.1* 07/06/2015 0555   HCT 46.8* 07/06/2015 0555   PLT 243 07/06/2015 0555   MCV 81.1 07/06/2015 0555   MCH 26.2 07/06/2015 0555   MCHC 32.3 07/06/2015 0555   RDW 16.9* 07/06/2015 0555   LYMPHSABS 1.3 07/03/2015 1618   MONOABS 0.6 07/03/2015 1618   EOSABS 0.1 07/03/2015 1618   BASOSABS 0.0 07/03/2015 1618   HEPATIC Function Panel  Recent Labs  04/01/15 1630 05/25/15 1634 07/03/15 1618  PROT 6.5 7.2 7.0   HEMOGLOBIN A1C No components found for: HGA1C,  MPG CARDIAC ENZYMES Lab Results  Component Value Date   CKTOTAL 59 09/07/2012   CKMB 3.2 09/07/2012   TROPONINI <0.03 07/04/2015   TROPONINI 0.03 07/03/2015   TROPONINI <0.03 07/03/2015   BNP No results for input([name]): PROBNP in the last 8760 hours. TSH  Recent Labs  02/01/15 1020  TSH 2.961   CHOLESTEROL  Recent Labs  05/28/15 0654  CHOL 157    Scheduled Meds: . allopurinol  100 mg Oral  Daily  . carvedilol  3.125 mg Oral BID WC  . digoxin  0.125 mg Oral Daily  . furosemide  40 mg Intravenous Daily  . heparin  5,000 Units Subcutaneous 3 times per day  . linagliptin  5 mg Oral Daily  . metolazone  5 mg Oral Daily  . potassium chloride  10 mEq Oral Daily  . sodium chloride  3 mL Intravenous Q12H  . spironolactone  25 mg Oral Daily   Continuous Infusions: . DOPamine 2.5 mcg/kg/min (07/09/15 1042)   PRN Meds:.sodium chloride, acetaminophen, acetaminophen, magnesium hydroxide, ondansetron (ZOFRAN) IV, sodium chloride  Assessment/Plan: Acute Biventricular systolic heart failure  CAD  [name]  DM, II  Hypertension  Anxiety and depression  Continue diuresis.    LOS: 6 days    [name]   MD  07/09/2015, 10:14 PM

## 2015-07-10 LAB — BASIC METABOLIC PANEL
Anion gap: 9 (ref 5–15)
BUN: 37 mg/dL — AB (ref 6–20)
CALCIUM: 8.7 mg/dL — AB (ref 8.9–10.3)
CO2: 25 mmol/L (ref 22–32)
CREATININE: 1.27 mg/dL — AB (ref 0.44–1.00)
Chloride: 103 mmol/L (ref 101–111)
GFR calc non Af Amer: 44 mL/min — ABNORMAL LOW (ref >60)
GFR, EST AFRICAN AMERICAN: 51 mL/min — AB (ref >60)
Glucose, Bld: 133 mg/dL — ABNORMAL HIGH (ref 65–99)
Potassium: 5.2 mmol/L — ABNORMAL HIGH (ref 3.5–5.1)
SODIUM: 137 mmol/L (ref 135–145)

## 2015-07-10 NOTE — Progress Notes (Signed)
Ref: Bich Mchaney S, MD   Subjective:  Continues diuresis with weight down to 151 lb.  Objective:  Vital Signs in the last 24 hours: Temp:  [98.1 F (36.7 C)-98.9 F (37.2 C)] 98.3 F (36.8 C) (12/30 1149) Pulse Rate:  [64-79] 70 (12/30 1736) Cardiac Rhythm:  [-] Normal sinus rhythm (12/30 0833) Resp:  [18] 18 (12/30 1149) BP: (138-159)/(64-86) 147/76 mmHg (12/30 1736) SpO2:  [97 %-100 %] 100 % (12/30 1149) Weight:  [68.584 kg (151 lb 3.2 oz)] 68.584 kg (151 lb 3.2 oz) (12/30 0500)  Physical Exam: BP Readings from Last 1 Encounters:  07/10/15 147/76    Wt Readings from Last 1 Encounters:  07/10/15 68.584 kg (151 lb 3.2 oz)    Weight change: -0.907 kg (-2 lb)  HEENT: [place]/AT, Eyes-Brown, PERL, EOMI, Conjunctiva-Pink, Sclera-Non-icteric Neck: + JVD, No bruit, Trachea midline. Lungs:  Clear, Bilateral. Cardiac:  Regular rhythm, normal S1 and S2, no S3.  Abdomen:  Soft, non-tender. Extremities:  1-2 + edema present. No cyanosis. No clubbing. CNS: AxOx3, Cranial nerves grossly intact, moves all 4 extremities. Right handed. Skin: Warm and dry.   Intake/Output from previous day: 12/29 0701 - 12/30 0700 In: 1728.9 [P.O.:1500; I.V.:228.9] Out: 2250 [Urine:2250]    Lab Results: BMET    Component Value Date/Time   NA 137 07/10/2015 0541   NA 139 07/08/2015 0320   NA 139 07/06/2015 0555   K 5.2* 07/10/2015 0541   K 4.1 07/08/2015 0320   K 4.1 07/06/2015 0555   CL 103 07/10/2015 0541   CL 102 07/08/2015 0320   CL 104 07/06/2015 0555   CO2 25 07/10/2015 0541   CO2 25 07/08/2015 0320   CO2 23 07/06/2015 0555   GLUCOSE 133* 07/10/2015 0541   GLUCOSE 165* 07/08/2015 0320   GLUCOSE 133* 07/06/2015 0555   BUN 37* 07/10/2015 0541   BUN 59* 07/08/2015 0320   BUN 67* 07/06/2015 0555   CREATININE 1.27* 07/10/2015 0541   CREATININE 1.71* 07/08/2015 0320   CREATININE 2.45* 07/06/2015 0555   CALCIUM 8.7* 07/10/2015 0541   CALCIUM 9.1 07/08/2015 0320   CALCIUM 8.8* 07/06/2015  0555   GFRNONAA 44* 07/10/2015 0541   GFRNONAA 31* 07/08/2015 0320   GFRNONAA 20* 07/06/2015 0555   GFRAA 51* 07/10/2015 0541   GFRAA 36* 07/08/2015 0320   GFRAA 23* 07/06/2015 0555   CBC    Component Value Date/Time   WBC 3.7* 07/06/2015 0555   RBC 5.77* 07/06/2015 0555   HGB 15.1* 07/06/2015 0555   HCT 46.8* 07/06/2015 0555   PLT 243 07/06/2015 0555   MCV 81.1 07/06/2015 0555   MCH 26.2 07/06/2015 0555   MCHC 32.3 07/06/2015 0555   RDW 16.9* 07/06/2015 0555   LYMPHSABS 1.3 07/03/2015 1618   MONOABS 0.6 07/03/2015 1618   EOSABS 0.1 07/03/2015 1618   BASOSABS 0.0 07/03/2015 1618   HEPATIC Function Panel  Recent Labs  04/01/15 1630 05/25/15 1634 07/03/15 1618  PROT 6.5 7.2 7.0   HEMOGLOBIN A1C No components found for: HGA1C,  MPG CARDIAC ENZYMES Lab Results  Component Value Date   CKTOTAL 59 09/07/2012   CKMB 3.2 09/07/2012   TROPONINI <0.03 07/04/2015   TROPONINI 0.03 07/03/2015   TROPONINI <0.03 07/03/2015   BNP No results for input(s): PROBNP in the last 8760 hours. TSH  Recent Labs  02/01/15 1020  TSH 2.961   CHOLESTEROL  Recent Labs  05/28/15 0654  CHOL 157    Scheduled Meds: . allopurinol  100 mg Oral  Daily  . carvedilol  3.125 mg Oral BID WC  . digoxin  0.125 mg Oral Daily  . furosemide  40 mg Intravenous Daily  . heparin  5,000 Units Subcutaneous 3 times per day  . linagliptin  5 mg Oral Daily  . metolazone  5 mg Oral Daily  . sodium chloride  3 mL Intravenous Q12H  . spironolactone  25 mg Oral Daily   Continuous Infusions: . DOPamine 2.5 mcg/kg/min (07/09/15 1042)   PRN Meds:.sodium chloride, acetaminophen, acetaminophen, magnesium hydroxide, ondansetron (ZOFRAN) IV, sodium chloride  Assessment/Plan: Acute Biventricular systolic heart failure  CAD  S/P Stent in LAD  DM, II  Hypertension  Anxiety and depression   Continue medical treatment.   LOS: 7 days    [name]  MD  07/10/2015, 6:30 PM

## 2015-07-11 MED ORDER — AZITHROMYCIN 250 MG PO TABS
250.0000 mg | ORAL_TABLET | Freq: Every day | ORAL | Status: DC
  Administered 2015-07-12 – 2015-07-13 (×2): 250 mg via ORAL
  Filled 2015-07-11 (×2): qty 1

## 2015-07-11 MED ORDER — AZITHROMYCIN 500 MG PO TABS
500.0000 mg | ORAL_TABLET | Freq: Every day | ORAL | Status: AC
  Administered 2015-07-11: 500 mg via ORAL
  Filled 2015-07-11: qty 1

## 2015-07-11 MED ORDER — COLCHICINE 0.6 MG PO TABS
0.6000 mg | ORAL_TABLET | Freq: Every day | ORAL | Status: DC
  Administered 2015-07-11 – 2015-07-13 (×3): 0.6 mg via ORAL
  Filled 2015-07-11 (×3): qty 1

## 2015-07-11 NOTE — Progress Notes (Signed)
Ref: Nuchem Grattan S, MD   Subjective:  Has left foot swelling and pain. Also has cough and cold x 2 days.  Objective:  Vital Signs in the last 24 hours: Temp:  [98.3 F (36.8 C)-98.8 F (37.1 C)] 98.8 F (37.1 C) (12/31 0621) Pulse Rate:  [64-88] 88 (12/31 1610) Cardiac Rhythm:  [-] Normal sinus rhythm (12/31 0700) Resp:  [18] 18 (12/31 0621) BP: (138-168)/(64-93) 168/93 mmHg (12/31 0621) SpO2:  [98 %-100 %] 98 % (12/31 0621) Weight:  [68.085 kg (150 lb 1.6 oz)] 68.085 kg (150 lb 1.6 oz) (12/31 9604)  Physical Exam: BP Readings from Last 1 Encounters:  07/11/15 168/93    Wt Readings from Last 1 Encounters:  07/11/15 68.085 kg (150 lb 1.6 oz)    Weight change: -0.499 kg (-1 lb 1.6 oz)  HEENT: [place]/AT, Eyes-Brown, PERL, EOMI, Conjunctiva-Pink, Sclera-Non-icteric Neck: + JVD, No bruit, Trachea midline. Lungs:  Harsh on cough, Bilateral. Cardiac:  Regular rhythm, normal S1 and S2, no S3. II/VI systolic murmur. Abdomen:  Soft, non-tender. Extremities:  1 + right and 2 + left ankle and foot edema with mild tenderness present. No cyanosis. No clubbing. CNS: AxOx3, Cranial nerves grossly intact, moves all 4 extremities. Right handed. Skin: Warm and dry.   Intake/Output from previous day: 12/30 0701 - 12/31 0700 In: 920 [P.O.:920] Out: 3100 [Urine:3100]    Lab Results: BMET    Component Value Date/Time   NA 137 07/10/2015 0541   NA 139 07/08/2015 0320   NA 139 07/06/2015 0555   K 5.2* 07/10/2015 0541   K 4.1 07/08/2015 0320   K 4.1 07/06/2015 0555   CL 103 07/10/2015 0541   CL 102 07/08/2015 0320   CL 104 07/06/2015 0555   CO2 25 07/10/2015 0541   CO2 25 07/08/2015 0320   CO2 23 07/06/2015 0555   GLUCOSE 133* 07/10/2015 0541   GLUCOSE 165* 07/08/2015 0320   GLUCOSE 133* 07/06/2015 0555   BUN 37* 07/10/2015 0541   BUN 59* 07/08/2015 0320   BUN 67* 07/06/2015 0555   CREATININE 1.27* 07/10/2015 0541   CREATININE 1.71* 07/08/2015 0320   CREATININE 2.45* 07/06/2015  0555   CALCIUM 8.7* 07/10/2015 0541   CALCIUM 9.1 07/08/2015 0320   CALCIUM 8.8* 07/06/2015 0555   GFRNONAA 44* 07/10/2015 0541   GFRNONAA 31* 07/08/2015 0320   GFRNONAA 20* 07/06/2015 0555   GFRAA 51* 07/10/2015 0541   GFRAA 36* 07/08/2015 0320   GFRAA 23* 07/06/2015 0555   CBC    Component Value Date/Time   WBC 3.7* 07/06/2015 0555   RBC 5.77* 07/06/2015 0555   HGB 15.1* 07/06/2015 0555   HCT 46.8* 07/06/2015 0555   PLT 243 07/06/2015 0555   MCV 81.1 07/06/2015 0555   MCH 26.2 07/06/2015 0555   MCHC 32.3 07/06/2015 0555   RDW 16.9* 07/06/2015 0555   LYMPHSABS 1.3 07/03/2015 1618   MONOABS 0.6 07/03/2015 1618   EOSABS 0.1 07/03/2015 1618   BASOSABS 0.0 07/03/2015 1618   HEPATIC Function Panel  Recent Labs  04/01/15 1630 05/25/15 1634 07/03/15 1618  PROT 6.5 7.2 7.0   HEMOGLOBIN A1C No components found for: HGA1C,  MPG CARDIAC ENZYMES Lab Results  Component Value Date   CKTOTAL 59 09/07/2012   CKMB 3.2 09/07/2012   TROPONINI <0.03 07/04/2015   TROPONINI 0.03 07/03/2015   TROPONINI <0.03 07/03/2015   BNP No results for input(s): PROBNP in the last 8760 hours. TSH  Recent Labs  02/01/15 1020  TSH 2.961  CHOLESTEROL  Recent Labs  05/28/15 0654  CHOL 157    Scheduled Meds: . allopurinol  100 mg Oral Daily  . azithromycin  500 mg Oral Daily   Followed by  . [START ON 07/12/2015] azithromycin  250 mg Oral Daily  . carvedilol  3.125 mg Oral BID WC  . colchicine  0.6 mg Oral Daily  . digoxin  0.125 mg Oral Daily  . furosemide  40 mg Intravenous Daily  . heparin  5,000 Units Subcutaneous 3 times per day  . linagliptin  5 mg Oral Daily  . metolazone  5 mg Oral Daily  . sodium chloride  3 mL Intravenous Q12H  . spironolactone  25 mg Oral Daily   Continuous Infusions: . DOPamine 2.5 mcg/kg/min (07/09/15 1042)   PRN Meds:.sodium chloride, acetaminophen, acetaminophen, magnesium hydroxide, ondansetron (ZOFRAN) IV, sodium  chloride  Assessment/Plan: Acute Biventricular systolic heart failure  CAD  S/P Stent in LAD  DM, II  Hypertension  Anxiety and depression Possible left foot gout Acute bronchitis  Add colchicine and z-pak.   LOS: 8 days    Orpah CobbAjay Lachele Lievanos  MD  07/11/2015, 9:44 AM

## 2015-07-12 LAB — BASIC METABOLIC PANEL
Anion gap: 13 (ref 5–15)
BUN: 34 mg/dL — AB (ref 6–20)
CALCIUM: 8.9 mg/dL (ref 8.9–10.3)
CO2: 20 mmol/L — ABNORMAL LOW (ref 22–32)
CREATININE: 1.45 mg/dL — AB (ref 0.44–1.00)
Chloride: 104 mmol/L (ref 101–111)
GFR calc Af Amer: 43 mL/min — ABNORMAL LOW (ref >60)
GFR, EST NON AFRICAN AMERICAN: 37 mL/min — AB (ref >60)
Glucose, Bld: 164 mg/dL — ABNORMAL HIGH (ref 65–99)
POTASSIUM: 4.7 mmol/L (ref 3.5–5.1)
SODIUM: 137 mmol/L (ref 135–145)

## 2015-07-12 NOTE — Progress Notes (Signed)
Ref: Darwin Rothlisberger S, MD   Subjective:  Decreasing left foot swelling and pain.   Objective:  Vital Signs in the last 24 hours: Temp:  [97.3 F (36.3 C)-98.9 F (37.2 C)] 97.3 F (36.3 C) (01/01 1258) Pulse Rate:  [66-79] 66 (01/01 1258) Cardiac Rhythm:  [-] Normal sinus rhythm (01/01 1440) Resp:  [18-20] 20 (01/01 1258) BP: (115-159)/(60-82) 134/60 mmHg (01/01 1258) SpO2:  [97 %-100 %] 100 % (01/01 1258) Weight:  [67.903 kg (149 lb 11.2 oz)] 67.903 kg (149 lb 11.2 oz) (01/01 0511)  Physical Exam: BP Readings from Last 1 Encounters:  07/12/15 134/60    Wt Readings from Last 1 Encounters:  07/12/15 67.903 kg (149 lb 11.2 oz)    Weight change: -0.181 kg (-6.4 oz)  HEENT: [place]/AT, Eyes-Brown, PERL, EOMI, Conjunctiva-Pink, Sclera-Non-icteric Neck: No JVD, No bruit, Trachea midline. Lungs:  Clearing, Bilateral. Cardiac:  Regular rhythm, normal S1 and S2, no S3. II/VI systolic murmur. Abdomen:  Soft, non-tender. Extremities:  1 + edema present. No cyanosis. No clubbing. CNS: AxOx3, Cranial nerves grossly intact, moves all 4 extremities. Right handed. Skin: Warm and dry.   Intake/Output from previous day: 12/31 0701 - 01/01 0700 In: 1394.1 [P.O.:1200; I.V.:194.1] Out: 2250 [Urine:2250]    Lab Results: BMET    Component Value Date/Time   NA 137 07/12/2015 0236   NA 137 07/10/2015 0541   NA 139 07/08/2015 0320   K 4.7 07/12/2015 0236   K 5.2* 07/10/2015 0541   K 4.1 07/08/2015 0320   CL 104 07/12/2015 0236   CL 103 07/10/2015 0541   CL 102 07/08/2015 0320   CO2 20* 07/12/2015 0236   CO2 25 07/10/2015 0541   CO2 25 07/08/2015 0320   GLUCOSE 164* 07/12/2015 0236   GLUCOSE 133* 07/10/2015 0541   GLUCOSE 165* 07/08/2015 0320   BUN 34* 07/12/2015 0236   BUN 37* 07/10/2015 0541   BUN 59* 07/08/2015 0320   CREATININE 1.45* 07/12/2015 0236   CREATININE 1.27* 07/10/2015 0541   CREATININE 1.71* 07/08/2015 0320   CALCIUM 8.9 07/12/2015 0236   CALCIUM 8.7* 07/10/2015 0541    CALCIUM 9.1 07/08/2015 0320   GFRNONAA 37* 07/12/2015 0236   GFRNONAA 44* 07/10/2015 0541   GFRNONAA 31* 07/08/2015 0320   GFRAA 43* 07/12/2015 0236   GFRAA 51* 07/10/2015 0541   GFRAA 36* 07/08/2015 0320   CBC    Component Value Date/Time   WBC 3.7* 07/06/2015 0555   RBC 5.77* 07/06/2015 0555   HGB 15.1* 07/06/2015 0555   HCT 46.8* 07/06/2015 0555   PLT 243 07/06/2015 0555   MCV 81.1 07/06/2015 0555   MCH 26.2 07/06/2015 0555   MCHC 32.3 07/06/2015 0555   RDW 16.9* 07/06/2015 0555   LYMPHSABS 1.3 07/03/2015 1618   MONOABS 0.6 07/03/2015 1618   EOSABS 0.1 07/03/2015 1618   BASOSABS 0.0 07/03/2015 1618   HEPATIC Function Panel  Recent Labs  04/01/15 1630 05/25/15 1634 07/03/15 1618  PROT 6.5 7.2 7.0   HEMOGLOBIN A1C No components found for: HGA1C,  MPG CARDIAC ENZYMES Lab Results  Component Value Date   CKTOTAL 59 09/07/2012   CKMB 3.2 09/07/2012   TROPONINI <0.03 07/04/2015   TROPONINI 0.03 07/03/2015   TROPONINI <0.03 07/03/2015   BNP No results for input(s): PROBNP in the last 8760 hours. TSH  Recent Labs  02/01/15 1020  TSH 2.961   CHOLESTEROL  Recent Labs  05/28/15 0654  CHOL 157    Scheduled Meds: . allopurinol  100 mg  Oral Daily  . azithromycin  250 mg Oral Daily  . carvedilol  3.125 mg Oral BID WC  . colchicine  0.6 mg Oral Daily  . digoxin  0.125 mg Oral Daily  . furosemide  40 mg Intravenous Daily  . heparin  5,000 Units Subcutaneous 3 times per day  . linagliptin  5 mg Oral Daily  . metolazone  5 mg Oral Daily  . sodium chloride  3 mL Intravenous Q12H  . spironolactone  25 mg Oral Daily   Continuous Infusions: . DOPamine 2.5 mcg/kg/min (07/11/15 1000)   PRN Meds:.sodium chloride, acetaminophen, acetaminophen, magnesium hydroxide, ondansetron (ZOFRAN) IV, sodium chloride  Assessment/Plan: Acute Biventricular systolic heart failure  CAD  S/P Stent in LAD  DM, II  Hypertension  Anxiety and depression Possible left  foot gout Acute bronchitis   Continue medical treatment.   LOS: 9 days    [name]  MD  07/12/2015, 6:49 PM

## 2015-07-13 MED ORDER — DIGOXIN 125 MCG PO TABS: 0.0625 mg | ORAL_TABLET | Freq: Every day | ORAL | Status: DC

## 2015-07-13 MED ORDER — POTASSIUM CHLORIDE CRYS ER 10 MEQ PO TBCR: 10.0000 meq | EXTENDED_RELEASE_TABLET | ORAL | Status: DC

## 2015-07-13 MED ORDER — COLCHICINE 0.6 MG PO TABS
0.6000 mg | ORAL_TABLET | Freq: Every day | ORAL | Status: DC
Start: 2015-07-13 — End: 2016-04-06

## 2015-07-13 NOTE — Discharge Summary (Signed)
Physician Discharge Summary  Patient ID: [name] MRN: [id number] DOB/AGE: [birth year] 64 y.o.  Admit date: 07/03/2015 Discharge date: 07/13/2015  Admission Diagnoses: Acute Biventricular systolic heart failure  CAD  S/P Stent in LAD  DM, II  Hypertension  Anxiety  Discharge Diagnoses:  Principal Problem: * Biventricular heart failure, NYHA class 2 (HCC) * Active Problems:   Acute on chronic left systolic heart failure (HCC)   Diabetes mellitus, II (HCC)   Essential hypertension   CAD    S/P Stent in LAD    Anxiety   Gout  Discharged Condition: fair  Hospital Course: 65 year old female presents with leg edema and progressive worsening of shortness of breath and 10 + pound weight gain x 2-3 weeks has past medical history of acute biventricular systolic heart failure, CAD, status post stent in LAD, diabetes mellitus type 2, hypertension and anxiety. No fever or chest pain. She had slow and steady diuresis with IV lasix and low dose dopamine drip. ACE-inhibitor was held due to renal dysfunction. She was discharged home in stable condition on 07/13/2015 with follow up by me in 1 month.   Consults: cardiology  Significant Diagnostic Studies: labs: Near normal CBC and BMET except elevated BUN of 56 and creatinine of 1.74. BNP of 2382, decreasing to 966.1 in 48 hours. Troponin-I negative x 3.  Chest x-ray: Cardiomegaly and central venous congestion. Sinus rhythm with prolonged QT.  Treatments: cardiac meds: carvedilol, amlodipine, digoxin, furosemide, spironolactone and potassium  Discharge Exam: Blood pressure 154/87, pulse 74, temperature 98.2 F (36.8 C), temperature source Oral, resp. rate 17, height 5\' 6"  (1.676 m), weight 66.633 kg (146 lb 14.4 oz), SpO2 98 %. HEENT: [place]/AT, Eyes-Brown, PERL, EOMI, Conjunctiva-Pink, Sclera-Non-icteric Neck: No JVD, No bruit, Trachea midline. Lungs: Clearing, Bilateral. Cardiac: Regular rhythm, normal S1 and S2, no S3. II/VI  systolic murmur. Abdomen: Soft, non-tender. Extremities: Trace edema present. No cyanosis. No clubbing. CNS: AxOx3, Cranial nerves grossly intact, moves all 4 extremities. Right handed. Skin: Warm and dry.  Disposition: 01-Home or Self Care     Medication List    STOP taking these medications        metoprolol succinate 25 MG 24 hr tablet  Commonly known as:  TOPROL-XL      TAKE these medications        acetaminophen 325 MG tablet  Commonly known as:  TYLENOL  Take 650 mg by mouth every 6 (six) hours as needed for mild pain.     allopurinol 100 MG tablet  Commonly known as:  ZYLOPRIM  Take 1 tablet (100 mg total) by mouth daily.     amLODipine 5 MG tablet  Commonly known as:  NORVASC  Take 1 tablet (5 mg total) by mouth daily.     aspirin 81 MG chewable tablet  Chew 81 mg by mouth daily.     carvedilol 3.125 MG tablet  Commonly known as:  COREG  Take 3.125 mg by mouth 2 (two) times daily with a meal.     colchicine 0.6 MG tablet  Take 1 tablet (0.6 mg total) by mouth daily.     digoxin 0.125 MG tablet  Commonly known as:  LANOXIN  Take 0.5 tablets (0.0625 mg total) by mouth daily.     furosemide 80 MG tablet  Commonly known as:  LASIX  Take 1 tablet (80 mg total) by mouth 2 (two) times daily.     linagliptin 5 MG Tabs tablet  Commonly known as:  TRADJENTA  Take 1 tablet (5 mg total) by mouth daily.     metolazone 5 MG tablet  Commonly known as:  ZAROXOLYN  Take 5 mg by mouth daily.     nitroGLYCERIN 0.4 MG SL tablet  Commonly known as:  NITROSTAT  Place 0.4 mg under the tongue every 5 (five) minutes as needed for chest pain.     potassium chloride 10 MEQ tablet  Commonly known as:  K-DUR,KLOR-CON  Take 1 tablet (10 mEq total) by mouth every Monday, Wednesday, and Friday.     spironolactone 25 MG tablet  Commonly known as:  ALDACTONE  Take 25 mg by mouth daily.           Follow-up Information    [facility] Lipuma S, [facility]. Schedule an  appointment as soon as possible for a visit in 1 week.   Specialty:  Cardiology   Contact information:   [address] [place] [address] [phone]       Signed: [name],Josef Tourigny S 07/13/2015, 9:21 AM

## 2015-08-07 ENCOUNTER — Encounter (INDEPENDENT_AMBULATORY_CARE_PROVIDER_SITE_OTHER): Payer: Medicaid Other | Admitting: Ophthalmology

## 2015-08-07 DIAGNOSIS — H35033 Hypertensive retinopathy, bilateral: Secondary | ICD-10-CM | POA: Diagnosis not present

## 2015-08-07 DIAGNOSIS — E113512 Type 2 diabetes mellitus with proliferative diabetic retinopathy with macular edema, left eye: Secondary | ICD-10-CM | POA: Diagnosis not present

## 2015-08-07 DIAGNOSIS — E1311 Other specified diabetes mellitus with ketoacidosis with coma: Secondary | ICD-10-CM | POA: Diagnosis not present

## 2015-08-07 DIAGNOSIS — E113311 Type 2 diabetes mellitus with moderate nonproliferative diabetic retinopathy with macular edema, right eye: Secondary | ICD-10-CM

## 2015-08-07 DIAGNOSIS — H43813 Vitreous degeneration, bilateral: Secondary | ICD-10-CM | POA: Diagnosis not present

## 2015-08-07 DIAGNOSIS — H34832 Tributary (branch) retinal vein occlusion, left eye, with macular edema: Secondary | ICD-10-CM | POA: Diagnosis not present

## 2015-08-07 DIAGNOSIS — I10 Essential (primary) hypertension: Secondary | ICD-10-CM | POA: Diagnosis not present

## 2015-08-28 ENCOUNTER — Encounter (INDEPENDENT_AMBULATORY_CARE_PROVIDER_SITE_OTHER): Payer: Medicaid Other | Admitting: Ophthalmology

## 2015-09-01 ENCOUNTER — Encounter (INDEPENDENT_AMBULATORY_CARE_PROVIDER_SITE_OTHER): Payer: Medicaid Other | Admitting: Ophthalmology

## 2015-09-01 DIAGNOSIS — E113512 Type 2 diabetes mellitus with proliferative diabetic retinopathy with macular edema, left eye: Secondary | ICD-10-CM | POA: Diagnosis not present

## 2015-09-01 DIAGNOSIS — E113311 Type 2 diabetes mellitus with moderate nonproliferative diabetic retinopathy with macular edema, right eye: Secondary | ICD-10-CM | POA: Diagnosis not present

## 2015-09-01 DIAGNOSIS — H35033 Hypertensive retinopathy, bilateral: Secondary | ICD-10-CM

## 2015-09-01 DIAGNOSIS — E11311 Type 2 diabetes mellitus with unspecified diabetic retinopathy with macular edema: Secondary | ICD-10-CM

## 2015-09-01 DIAGNOSIS — H43813 Vitreous degeneration, bilateral: Secondary | ICD-10-CM | POA: Diagnosis not present

## 2015-09-01 DIAGNOSIS — H34832 Tributary (branch) retinal vein occlusion, left eye, with macular edema: Secondary | ICD-10-CM

## 2015-09-01 DIAGNOSIS — I10 Essential (primary) hypertension: Secondary | ICD-10-CM

## 2015-09-16 ENCOUNTER — Other Ambulatory Visit (INDEPENDENT_AMBULATORY_CARE_PROVIDER_SITE_OTHER): Payer: Medicaid Other | Admitting: Ophthalmology

## 2015-09-16 DIAGNOSIS — E113312 Type 2 diabetes mellitus with moderate nonproliferative diabetic retinopathy with macular edema, left eye: Secondary | ICD-10-CM | POA: Diagnosis not present

## 2015-09-16 DIAGNOSIS — E11311 Type 2 diabetes mellitus with unspecified diabetic retinopathy with macular edema: Secondary | ICD-10-CM

## 2015-09-28 ENCOUNTER — Encounter (INDEPENDENT_AMBULATORY_CARE_PROVIDER_SITE_OTHER): Payer: Medicaid Other | Admitting: Ophthalmology

## 2015-09-30 ENCOUNTER — Encounter (INDEPENDENT_AMBULATORY_CARE_PROVIDER_SITE_OTHER): Payer: Medicaid Other | Admitting: Ophthalmology

## 2015-09-30 DIAGNOSIS — H2513 Age-related nuclear cataract, bilateral: Secondary | ICD-10-CM

## 2015-09-30 DIAGNOSIS — I10 Essential (primary) hypertension: Secondary | ICD-10-CM

## 2015-09-30 DIAGNOSIS — E11311 Type 2 diabetes mellitus with unspecified diabetic retinopathy with macular edema: Secondary | ICD-10-CM | POA: Diagnosis not present

## 2015-09-30 DIAGNOSIS — E113513 Type 2 diabetes mellitus with proliferative diabetic retinopathy with macular edema, bilateral: Secondary | ICD-10-CM | POA: Diagnosis not present

## 2015-09-30 DIAGNOSIS — H4312 Vitreous hemorrhage, left eye: Secondary | ICD-10-CM | POA: Diagnosis not present

## 2015-09-30 DIAGNOSIS — H35033 Hypertensive retinopathy, bilateral: Secondary | ICD-10-CM | POA: Diagnosis not present

## 2015-09-30 DIAGNOSIS — H43813 Vitreous degeneration, bilateral: Secondary | ICD-10-CM

## 2015-09-30 DIAGNOSIS — H34832 Tributary (branch) retinal vein occlusion, left eye, with macular edema: Secondary | ICD-10-CM | POA: Diagnosis not present

## 2015-10-28 ENCOUNTER — Encounter (INDEPENDENT_AMBULATORY_CARE_PROVIDER_SITE_OTHER): Payer: Medicaid Other | Admitting: Ophthalmology

## 2015-11-04 ENCOUNTER — Encounter (INDEPENDENT_AMBULATORY_CARE_PROVIDER_SITE_OTHER): Payer: Medicaid Other | Admitting: Ophthalmology

## 2016-02-09 ENCOUNTER — Ambulatory Visit: Payer: Self-pay | Attending: Internal Medicine

## 2016-02-17 ENCOUNTER — Ambulatory Visit: Payer: Medicaid Other

## 2016-02-23 ENCOUNTER — Ambulatory Visit: Payer: Medicaid Other

## 2016-02-26 ENCOUNTER — Emergency Department (HOSPITAL_COMMUNITY)
Admission: EM | Admit: 2016-02-26 | Discharge: 2016-02-26 | Disposition: A | Discharge status: Home / Self Care | Payer: Self-pay | Attending: Emergency Medicine | Admitting: Emergency Medicine

## 2016-02-26 ENCOUNTER — Emergency Department (HOSPITAL_COMMUNITY): Payer: Self-pay

## 2016-02-26 ENCOUNTER — Encounter (HOSPITAL_COMMUNITY): Payer: Self-pay | Admitting: Emergency Medicine

## 2016-02-26 DIAGNOSIS — I110 Hypertensive heart disease with heart failure: Secondary | ICD-10-CM | POA: Insufficient documentation

## 2016-02-26 DIAGNOSIS — E119 Type 2 diabetes mellitus without complications: Secondary | ICD-10-CM | POA: Insufficient documentation

## 2016-02-26 DIAGNOSIS — I2510 Atherosclerotic heart disease of native coronary artery without angina pectoris: Secondary | ICD-10-CM | POA: Insufficient documentation

## 2016-02-26 DIAGNOSIS — S80821A Blister (nonthermal), right lower leg, initial encounter: Secondary | ICD-10-CM

## 2016-02-26 DIAGNOSIS — Y999 Unspecified external cause status: Secondary | ICD-10-CM | POA: Insufficient documentation

## 2016-02-26 DIAGNOSIS — Y939 Activity, unspecified: Secondary | ICD-10-CM | POA: Insufficient documentation

## 2016-02-26 DIAGNOSIS — Y929 Unspecified place or not applicable: Secondary | ICD-10-CM | POA: Insufficient documentation

## 2016-02-26 DIAGNOSIS — Z87891 Personal history of nicotine dependence: Secondary | ICD-10-CM | POA: Insufficient documentation

## 2016-02-26 DIAGNOSIS — X58XXXA Exposure to other specified factors, initial encounter: Secondary | ICD-10-CM | POA: Insufficient documentation

## 2016-02-26 DIAGNOSIS — I509 Heart failure, unspecified: Secondary | ICD-10-CM

## 2016-02-26 DIAGNOSIS — Z7982 Long term (current) use of aspirin: Secondary | ICD-10-CM | POA: Insufficient documentation

## 2016-02-26 DIAGNOSIS — I5041 Acute combined systolic (congestive) and diastolic (congestive) heart failure: Secondary | ICD-10-CM | POA: Insufficient documentation

## 2016-02-26 DIAGNOSIS — Z955 Presence of coronary angioplasty implant and graft: Secondary | ICD-10-CM | POA: Insufficient documentation

## 2016-02-26 DIAGNOSIS — Z7984 Long term (current) use of oral hypoglycemic drugs: Secondary | ICD-10-CM | POA: Insufficient documentation

## 2016-02-26 LAB — CBC WITH DIFFERENTIAL/PLATELET
Basophils Absolute: 0 K/uL (ref 0.0–0.1)
Basophils Relative: 0 %
Eosinophils Absolute: 0 K/uL (ref 0.0–0.7)
Eosinophils Relative: 0 %
HCT: 46.4 % — ABNORMAL HIGH (ref 36.0–46.0)
Hemoglobin: 15.6 g/dL — ABNORMAL HIGH (ref 12.0–15.0)
Lymphocytes Relative: 28 %
Lymphs Abs: 1.3 K/uL (ref 0.7–4.0)
MCH: 26.6 pg (ref 26.0–34.0)
MCHC: 33.6 g/dL (ref 30.0–36.0)
MCV: 79.2 fL (ref 78.0–100.0)
Monocytes Absolute: 0.3 K/uL (ref 0.1–1.0)
Monocytes Relative: 7 %
Neutro Abs: 2.9 K/uL (ref 1.7–7.7)
Neutrophils Relative %: 65 %
Platelets: UNDETERMINED K/uL (ref 150–400)
RBC: 5.86 MIL/uL — ABNORMAL HIGH (ref 3.87–5.11)
RDW: 19 % — ABNORMAL HIGH (ref 11.5–15.5)
WBC: 4.5 K/uL (ref 4.0–10.5)

## 2016-02-26 LAB — BASIC METABOLIC PANEL WITH GFR
Anion gap: 11 (ref 5–15)
BUN: 53 mg/dL — ABNORMAL HIGH (ref 6–20)
CO2: 25 mmol/L (ref 22–32)
Calcium: 9.2 mg/dL (ref 8.9–10.3)
Chloride: 101 mmol/L (ref 101–111)
Creatinine, Ser: 1.99 mg/dL — ABNORMAL HIGH (ref 0.44–1.00)
GFR calc Af Amer: 30 mL/min — ABNORMAL LOW
GFR calc non Af Amer: 26 mL/min — ABNORMAL LOW
Glucose, Bld: 123 mg/dL — ABNORMAL HIGH (ref 65–99)
Potassium: 4.5 mmol/L (ref 3.5–5.1)
Sodium: 137 mmol/L (ref 135–145)

## 2016-02-26 LAB — BRAIN NATRIURETIC PEPTIDE: B Natriuretic Peptide: 1963.6 pg/mL — ABNORMAL HIGH (ref 0.0–100.0)

## 2016-02-26 MED ORDER — FUROSEMIDE 10 MG/ML IJ SOLN
80.0000 mg | Freq: Once | INTRAMUSCULAR | Status: AC
  Administered 2016-02-26: 80 mg via INTRAVENOUS
  Filled 2016-02-26: qty 8

## 2016-02-26 MED ORDER — CEPHALEXIN 500 MG PO CAPS: 500.0000 mg | ORAL_CAPSULE | Freq: Four times a day (QID) | ORAL | 0 refills | Status: DC

## 2016-02-26 NOTE — Discharge Instructions (Signed)
Take antibiotics as prescribed. Wear compression stockings and keep legs elevated as much as possible. Take home fluid and heart pills as prescribed. Follow up with Dr. Algie CofferKadakia next week for re-evaluation. Return to the ED if you experience redness or swelling around your blister, chest pain or shortness of breath.

## 2016-02-26 NOTE — ED Triage Notes (Signed)
Large blisters coming up on leg x 1 week rt leg,

## 2016-02-26 NOTE — ED Provider Notes (Signed)
[facility]-EMER[facility]ENCY DEPT Provider Note   CSN: [id number] Arrival date & time: 02/26/16  1001     History   Chief Complaint Chief Complaint  Patient presents with  . Leg Pain    HPI [name] is a 65 y.o. female with a pmhx of CHF, HTN, HLD Emma DM present to the ED today complaining of blisters on her right lower extremity. Patient states that she has chronic swelling of her bilateral lower extremities with associated fluid buildup. Patient takes 80 mg of Lasix twice a day for this and she reports compliance with her symptoms. Patient states that last week she noticed that her right lower extremity began to itch. She subsequently developed blisters over that area. Patient reports history of similar symptoms. She denies any pain to that area. No chest pain or shortness of breath.   HPI  Past Medical History:  Diagnosis Date  . CHF (congestive heart failure) (HCC)   . Coronary artery disease   . GERD (gastroesophageal reflux disease)   . Gout   . High cholesterol   . Hypertension   . Shortness of breath dyspnea   . Type II diabetes mellitus [facility](HCC)     Patient Active Problem List   Diagnosis Date Noted  . Acute on chronic left systolic heart failure (HCC) 07/03/2015  . Acute on chronic systolic CHF (congestive heart failure) (HCC) 04/01/2015  . CHF exacerbation (HCC) 02/01/2015  . Acute on chronic systolic heart failure (HCC) 02/01/2015  . Biventricular heart failure (HCC) 11/28/2014  . Biventricular CHF (congestive heart failure) (HCC) 09/22/2014  . Biventricular heart failure, NYHA class 2 (HCC) 08/12/2014  . Acute combined systolic and diastolic congestive heart failure (HCC) 06/19/2014  . Acute left systolic heart failure (HCC) 07/18/2013  . Systolic and diastolic CHF, acute 11/23/2011  . PROTEINURIA, MILD 07/21/2006  . Diabetes mellitus (HCC) 05/24/2006  . HYPERLIPIDEMIA 05/24/2006  . Essential hypertension 05/24/2006    Past Surgical History:  Procedure  Laterality Date  . CARDIAC CATHETERIZATION  2014  . CORONARY ANGIOPLASTY WITH STENT PLACEMENT  2013  . LEFT AND RIGHT HEART CATHETERIZATION WITH CORONARY ANGIOGRAM N/A 11/25/2011   Procedure: LEFT AND RIGHT HEART CATHETERIZATION WITH CORONARY ANGIOGRAM;  Surgeon: Robynn PaneMohan N Harwani, MD;  Location: [facility] CATH LAB;  Service: Cardiovascular;  Laterality: N/A;  . LEFT AND RIGHT HEART CATHETERIZATION WITH CORONARY ANGIOGRAM N/A 09/07/2012   Procedure: LEFT AND RIGHT HEART CATHETERIZATION WITH CORONARY ANGIOGRAM;  Surgeon: Ricki RodriguezAjay S Kadakia, MD;  Location: [facility] CATH LAB;  Service: Cardiovascular;  Laterality: N/A;  . PERCUTANEOUS CORONARY STENT INTERVENTION (PCI-S) Right 11/25/2011   Procedure: PERCUTANEOUS CORONARY STENT INTERVENTION (PCI-S);  Surgeon: Robynn PaneMohan N Harwani, MD;  [facility];  Service: Cardiovascular;  Laterality: Right;  . TONSILLECTOMY  1950's    OB History    No data available       Home Medications    Prior to Admission medications   Medication Sig Start Date End Date Taking? Authorizing Provider  acetaminophen (TYLENOL) 500 MG tablet Take 500 mg by mouth every 6 (six) hours as needed for moderate pain or headache.   Yes Historical Provider, MD  albuterol (PROVENTIL HFA;VENTOLIN HFA) 108 (90 Base) MCG/ACT inhaler Inhale 2 puffs into the lungs every 6 (six) hours as needed for wheezing or shortness of breath.   Yes Historical Provider, MD  allopurinol (ZYLOPRIM) 100 MG tablet Take 1 tablet (100 mg total) by mouth daily. Patient taking differently: Take 100 mg by mouth 2 (two) times daily.  12/08/14  Yes [name], MD  amLODipine (NORVASC) 5 MG tablet Take 1 tablet (5 mg total) by mouth daily. 12/08/14  Yes [name], MD  aspirin 81 MG chewable tablet Chew 81 mg by mouth daily.   Yes Historical Provider, MD  Cholecalciferol (VITAMIN D3) 1000 units CAPS Take 1,000 Units by mouth every Monday.   Yes Historical Provider, MD  cycloSPORINE (RESTASIS) 0.05 % ophthalmic emulsion Place 1  drop into both eyes daily.   Yes Historical Provider, MD  digoxin (LANOXIN) 0.125 MG tablet Take 0.5 tablets (0.0625 mg total) by mouth daily. 07/13/15  Yes [name], MD  furosemide (LASIX) 80 MG tablet Take 1 tablet (80 mg total) by mouth 2 (two) times daily. 05/29/15  Yes [name], MD  isosorbide-hydrALAZINE (BIDIL) 20-37.5 MG tablet Take 1 tablet by mouth 3 (three) times daily as needed (for heart).   Yes Historical Provider, MD  metolazone (ZAROXOLYN) 5 MG tablet Take 5 mg by mouth daily as needed (for fluid).    Yes Historical Provider, MD  Olopatadine HCl (PAZEO) 0.7 % SOLN Apply 1 drop to eye every morning.   Yes Historical Provider, MD  omeprazole (PRILOSEC OTC) 20 MG tablet Take 20 mg by mouth daily as needed (for acid reflux).   Yes Historical Provider, MD  Polyethyl Glycol-Propyl Glycol (SYSTANE ULTRA) 0.4-0.3 % SOLN Place 1 drop into both eyes daily.   Yes Historical Provider, MD  potassium chloride (K-DUR,KLOR-CON) 10 MEQ tablet Take 1 tablet (10 mEq total) by mouth every Monday, Wednesday, and Friday. Patient taking differently: Take 10 mEq by mouth every Monday.  07/13/15  Yes [name], MD  spironolactone (ALDACTONE) 25 MG tablet Take 25 mg by mouth daily.   Yes Historical Provider, MD  colchicine 0.6 MG tablet Take 1 tablet (0.6 mg total) by mouth daily. Patient taking differently: Take 0.6 mg by mouth daily as needed (for gout).  07/13/15   [name], MD  linagliptin (TRADJENTA) 5 MG TABS tablet Take 1 tablet (5 mg total) by mouth daily. Patient not taking: Reported on 02/26/2016 09/29/14   [name], MD  nitroGLYCERIN (NITROSTAT) 0.4 MG SL tablet Place 0.4 mg under the tongue every 5 (five) minutes as needed for chest pain. 09/10/12   [name], MD    Family History No family history on file.  Social History Social History  Substance Use Topics  . Smoking status: Former Smoker    Packs/day: 0.50    Years: 20.00    Types: Cigarettes  . Smokeless tobacco: Never  Used     Comment: quit smoking in the 80's  . Alcohol use No     Allergies   Bidil [isosorb dinitrate-hydralazine]   Review of Systems Review of Systems  All other systems reviewed and are negative.    Physical Exam Updated Vital Signs BP (!) 166/108 (BP Location: Right Arm)   Pulse 94   Temp 98 F (36.7 C) (Oral)   Resp 18   SpO2 95%   Physical Exam  Constitutional: She is oriented to person, place, and time. She appears well-developed and well-nourished. No distress.  HENT:  Head: Normocephalic and atraumatic.  Mouth/Throat: No oropharyngeal exudate.  Eyes: Conjunctivae and EOM are normal. Pupils are equal, round, and reactive to light. Right eye exhibits no discharge. Left eye exhibits no discharge. No scleral icterus.  Cardiovascular: Normal rate, regular rhythm and intact distal pulses.  Exam reveals no gallop and no friction rub.   Murmur heard. Pulmonary/Chest: Effort normal.  No respiratory distress. She has no wheezes. She has no rales. She exhibits no tenderness.  Diminished breath sounds  Abdominal: Soft. She exhibits no distension. There is no tenderness. There is no guarding.  Musculoskeletal: Normal range of motion. She exhibits edema ( 2+ b/l pitting edema up to groin).  Neurological: She is alert and oriented to person, place, and time.  Skin: Skin is warm and dry. No rash noted. She is not diaphoretic. No erythema. No pallor.  1 large tense bullae on R lower shin with multiple smaller bullae. No rash  Psychiatric: She has a normal mood and affect. Her behavior is normal.  Nursing note and vitals reviewed.    ED Treatments / Results  Labs (all labs ordered are listed, but only abnormal results are displayed) Labs Reviewed  BASIC METABOLIC PANEL - Abnormal; Notable for the following:       Result Value   Glucose, Bld 123 (*)    BUN 53 (*)    Creatinine, Ser 1.99 (*)    GFR calc non Af Amer 26 (*)    GFR calc Af Amer 30 (*)    All other components  within normal limits  CBC WITH DIFFERENTIAL/PLATELET - Abnormal; Notable for the following:    RBC 5.86 (*)    Hemoglobin 15.6 (*)    HCT 46.4 (*)    RDW 19.0 (*)    All other components within normal limits  BRAIN NATRIURETIC PEPTIDE - Abnormal; Notable for the following:    B Natriuretic Peptide 1,963.6 (*)    All other components within normal limits    EKG  EKG Interpretation None       Radiology No results found.  Procedures Procedures (including critical care time)  INCISION AND DRAINAGE Performed by: Dub MikesSamantha Tripp Dea Bitting Consent: Verbal consent obtained. Risks and benefits: risks, benefits and alternatives were discussed Type: bullae  Body area: RLE  Anesthesia: local infiltration  Incision was made with a scalpel.  Local anesthetic: none  Complexity: simple Drainage: serosanguinous  Drainage amount: copious   Patient tolerance: Patient tolerated the procedure well with no immediate complications.     Medications Ordered in ED Medications  furosemide (LASIX) injection 80 mg (80 mg Intravenous Given 02/26/16 1218)     Initial Impression / Assessment and Plan / ED Course  I have reviewed the triage vital signs and the nursing notes.  Pertinent labs & imaging results that were available during my care of the patient were reviewed by me and considered in my medical decision making (see chart for details).   65 y.o F with a pmhx of CHF, DM presents to the ED with worsening bilateral LE swelling and large bullae that have developed on RLE. No CP or SOB. No sign of infection of legs. Bullae likely due to fluid overload. Creatinine elevated but at baseline. CBC unremarkable. BNP 1900, this appears to be chronic for pt. Previous results read of 2000. Pt given 80 IV lasix in ED. Bullae I&D'd in ED. Wound dressed and cleaned appropriately. Clinical Course  Comment By Time  Spoke with Dr. Algie CofferKadakia who recommends CXR and EKG. He will consult pt in ED and  disposition accordingly. Lester KinsmanSamantha Tripp ColbertDowless, PA-C 08/18 1454  Dr. Algie CofferKadakia, pts cardiologist, reviewed pts EKG and CXR and feel that she can be discharged with follow up in 1 week in his office. Will d/c with Keflex for wound on leg per Dr. Algie CofferKadakia recommendations. Return precautions outlined in patient discharge instructions.   Patient  was discussed with and seen by Dr. Ranae Palms who agrees with the treatment plan.    Final Clinical Impressions(s) / ED Diagnoses   Final diagnoses:  Congestive heart failure, unspecified congestive heart failure chronicity, unspecified congestive heart failure type (HCC)  Blister of lower leg, right, initial encounter    New Prescriptions Discharge Medication List as of 02/26/2016  3:57 PM    START taking these medications   Details  cephALEXin (KEFLEX) 500 MG capsule Take 1 capsule (500 mg total) by mouth 4 (four) times daily., Starting Fri 02/26/2016, Print         Lester Kinsman Haywood, PA-C 02/27/16 1628    Loren Racer, MD 02/28/16 [phone]

## 2016-02-26 NOTE — Progress Notes (Signed)
Patient comes in with several serous fluid filled blisters on her lower left leg that showed up this week. The blisters have increased in size and so she presents to the ED. Drainage is clear and the blisters itch but do not cause any pain. This is the second time patient has had these blisters. She states the first time was about a year ago. She has CHF and usually has swollen legs. No rash prior to the blisters. Denies chest pain and shortness of breath.

## 2016-03-24 ENCOUNTER — Inpatient Hospital Stay (HOSPITAL_COMMUNITY)
Admission: AD | Admit: 2016-03-24 | Discharge: 2016-04-06 | DRG: 602 | Disposition: A | Discharge status: Home / Self Care | Payer: Self-pay | Source: Ambulatory Visit | Attending: Cardiovascular Disease | Admitting: Cardiovascular Disease

## 2016-03-24 ENCOUNTER — Inpatient Hospital Stay (HOSPITAL_COMMUNITY): Payer: Self-pay

## 2016-03-24 ENCOUNTER — Encounter (HOSPITAL_COMMUNITY): Payer: Self-pay

## 2016-03-24 DIAGNOSIS — E1122 Type 2 diabetes mellitus with diabetic chronic kidney disease: Secondary | ICD-10-CM | POA: Diagnosis present

## 2016-03-24 DIAGNOSIS — Z6824 Body mass index (BMI) 24.0-24.9, adult: Secondary | ICD-10-CM

## 2016-03-24 DIAGNOSIS — Z7982 Long term (current) use of aspirin: Secondary | ICD-10-CM

## 2016-03-24 DIAGNOSIS — Z87891 Personal history of nicotine dependence: Secondary | ICD-10-CM

## 2016-03-24 DIAGNOSIS — K219 Gastro-esophageal reflux disease without esophagitis: Secondary | ICD-10-CM | POA: Diagnosis present

## 2016-03-24 DIAGNOSIS — L03115 Cellulitis of right lower limb: Principal | ICD-10-CM | POA: Diagnosis present

## 2016-03-24 DIAGNOSIS — I10 Essential (primary) hypertension: Secondary | ICD-10-CM | POA: Diagnosis present

## 2016-03-24 DIAGNOSIS — E7800 Pure hypercholesterolemia, unspecified: Secondary | ICD-10-CM | POA: Diagnosis present

## 2016-03-24 DIAGNOSIS — Z955 Presence of coronary angioplasty implant and graft: Secondary | ICD-10-CM

## 2016-03-24 DIAGNOSIS — R0602 Shortness of breath: Secondary | ICD-10-CM

## 2016-03-24 DIAGNOSIS — I2510 Atherosclerotic heart disease of native coronary artery without angina pectoris: Secondary | ICD-10-CM | POA: Diagnosis present

## 2016-03-24 DIAGNOSIS — E441 Mild protein-calorie malnutrition: Secondary | ICD-10-CM | POA: Diagnosis present

## 2016-03-24 DIAGNOSIS — E1129 Type 2 diabetes mellitus with other diabetic kidney complication: Secondary | ICD-10-CM

## 2016-03-24 DIAGNOSIS — I5023 Acute on chronic systolic (congestive) heart failure: Secondary | ICD-10-CM | POA: Diagnosis present

## 2016-03-24 DIAGNOSIS — I472 Ventricular tachycardia: Secondary | ICD-10-CM | POA: Diagnosis present

## 2016-03-24 DIAGNOSIS — I130 Hypertensive heart and chronic kidney disease with heart failure and stage 1 through stage 4 chronic kidney disease, or unspecified chronic kidney disease: Secondary | ICD-10-CM | POA: Diagnosis present

## 2016-03-24 DIAGNOSIS — I5082 Biventricular heart failure: Secondary | ICD-10-CM

## 2016-03-24 DIAGNOSIS — M109 Gout, unspecified: Secondary | ICD-10-CM | POA: Diagnosis present

## 2016-03-24 DIAGNOSIS — N183 Chronic kidney disease, stage 3 (moderate): Secondary | ICD-10-CM | POA: Diagnosis present

## 2016-03-24 DIAGNOSIS — E876 Hypokalemia: Secondary | ICD-10-CM | POA: Diagnosis present

## 2016-03-24 LAB — CBC WITH DIFFERENTIAL/PLATELET
Basophils Absolute: 0 10*3/uL (ref 0.0–0.1)
Basophils Relative: 1 %
EOS ABS: 0 10*3/uL (ref 0.0–0.7)
EOS PCT: 0 %
HCT: 45.3 % (ref 36.0–46.0)
Hemoglobin: 14.9 g/dL (ref 12.0–15.0)
LYMPHS ABS: 1.6 10*3/uL (ref 0.7–4.0)
LYMPHS PCT: 35 %
MCH: 26.7 pg (ref 26.0–34.0)
MCHC: 32.9 g/dL (ref 30.0–36.0)
MCV: 81.2 fL (ref 78.0–100.0)
MONO ABS: 0.4 10*3/uL (ref 0.1–1.0)
MONOS PCT: 10 %
Neutro Abs: 2.4 10*3/uL (ref 1.7–7.7)
Neutrophils Relative %: 54 %
PLATELETS: 177 10*3/uL (ref 150–400)
RBC: 5.58 MIL/uL — ABNORMAL HIGH (ref 3.87–5.11)
RDW: 18.8 % — ABNORMAL HIGH (ref 11.5–15.5)
WBC: 4.5 10*3/uL (ref 4.0–10.5)

## 2016-03-24 LAB — COMPREHENSIVE METABOLIC PANEL
ALT: 15 U/L (ref 14–54)
ANION GAP: 12 (ref 5–15)
AST: 28 U/L (ref 15–41)
Albumin: 3 g/dL — ABNORMAL LOW (ref 3.5–5.0)
Alkaline Phosphatase: 78 U/L (ref 38–126)
BUN: 59 mg/dL — ABNORMAL HIGH (ref 6–20)
CALCIUM: 8.9 mg/dL (ref 8.9–10.3)
CHLORIDE: 103 mmol/L (ref 101–111)
CO2: 23 mmol/L (ref 22–32)
CREATININE: 1.95 mg/dL — AB (ref 0.44–1.00)
GFR, EST AFRICAN AMERICAN: 30 mL/min — AB (ref >60)
GFR, EST NON AFRICAN AMERICAN: 26 mL/min — AB (ref >60)
Glucose, Bld: 149 mg/dL — ABNORMAL HIGH (ref 65–99)
Potassium: 3.4 mmol/L — ABNORMAL LOW (ref 3.5–5.1)
SODIUM: 138 mmol/L (ref 135–145)
Total Bilirubin: 3.5 mg/dL — ABNORMAL HIGH (ref 0.3–1.2)
Total Protein: 6.9 g/dL (ref 6.5–8.1)

## 2016-03-24 LAB — BRAIN NATRIURETIC PEPTIDE: B Natriuretic Peptide: 2477.7 pg/mL — ABNORMAL HIGH (ref 0.0–100.0)

## 2016-03-24 LAB — GLUCOSE, CAPILLARY
Glucose-Capillary: 98 mg/dL (ref 65–99)
Glucose-Capillary: 158 mg/dL — ABNORMAL HIGH (ref 65–99)

## 2016-03-24 MED ORDER — DIGOXIN 125 MCG PO TABS
0.0625 mg | ORAL_TABLET | Freq: Every day | ORAL | Status: DC
  Administered 2016-03-24 – 2016-04-06 (×14): 0.0625 mg via ORAL
  Filled 2016-03-24 (×14): qty 1

## 2016-03-24 MED ORDER — METOLAZONE 5 MG PO TABS
5.0000 mg | ORAL_TABLET | Freq: Every day | ORAL | Status: DC
  Administered 2016-03-25 – 2016-04-06 (×13): 5 mg via ORAL
  Filled 2016-03-24 (×14): qty 1

## 2016-03-24 MED ORDER — ENOXAPARIN SODIUM 30 MG/0.3ML ~~LOC~~ SOLN
30.0000 mg | SUBCUTANEOUS | Status: DC
  Administered 2016-03-25 – 2016-03-26 (×2): 30 mg via SUBCUTANEOUS
  Filled 2016-03-24 (×3): qty 0.3

## 2016-03-24 MED ORDER — AMLODIPINE BESYLATE 5 MG PO TABS
5.0000 mg | ORAL_TABLET | Freq: Every day | ORAL | Status: DC
  Administered 2016-03-24 – 2016-03-25 (×2): 5 mg via ORAL
  Filled 2016-03-24 (×2): qty 1

## 2016-03-24 MED ORDER — ACETAMINOPHEN 500 MG PO TABS
500.0000 mg | ORAL_TABLET | Freq: Four times a day (QID) | ORAL | Status: DC | PRN
  Administered 2016-03-26 – 2016-04-04 (×3): 500 mg via ORAL
  Filled 2016-03-24 (×4): qty 1

## 2016-03-24 MED ORDER — ALBUTEROL SULFATE (2.5 MG/3ML) 0.083% IN NEBU: 3.0000 mL | INHALATION_SOLUTION | Freq: Four times a day (QID) | RESPIRATORY_TRACT | Status: DC | PRN

## 2016-03-24 MED ORDER — FUROSEMIDE 10 MG/ML IJ SOLN
40.0000 mg | Freq: Once | INTRAMUSCULAR | Status: AC
  Administered 2016-03-24: 40 mg via INTRAVENOUS
  Filled 2016-03-24: qty 4

## 2016-03-24 MED ORDER — COLLAGENASE 250 UNIT/GM EX OINT
TOPICAL_OINTMENT | Freq: Every day | CUTANEOUS | Status: DC
  Administered 2016-03-24 – 2016-04-04 (×12): via TOPICAL
  Administered 2016-04-05 – 2016-04-06 (×2): 1 via TOPICAL
  Filled 2016-03-24 (×2): qty 30

## 2016-03-24 MED ORDER — CYCLOSPORINE 0.05 % OP EMUL
1.0000 [drp] | Freq: Every day | OPHTHALMIC | Status: DC
  Administered 2016-03-25 – 2016-04-06 (×13): 1 [drp] via OPHTHALMIC
  Filled 2016-03-24 (×14): qty 1

## 2016-03-24 MED ORDER — VITAMIN D 1000 UNITS PO TABS
1000.0000 [IU] | ORAL_TABLET | ORAL | Status: DC
  Administered 2016-03-28 – 2016-04-05 (×3): 1000 [IU] via ORAL
  Filled 2016-03-24 (×7): qty 1

## 2016-03-24 MED ORDER — ALLOPURINOL 100 MG PO TABS
100.0000 mg | ORAL_TABLET | Freq: Two times a day (BID) | ORAL | Status: DC
  Administered 2016-03-24 – 2016-04-06 (×26): 100 mg via ORAL
  Filled 2016-03-24 (×26): qty 1

## 2016-03-24 MED ORDER — DEXTROSE 5 % IV SOLN
1.0000 g | INTRAVENOUS | Status: DC
  Administered 2016-03-24 – 2016-04-01 (×9): 1 g via INTRAVENOUS
  Filled 2016-03-24 (×12): qty 10

## 2016-03-24 MED ORDER — ASPIRIN 81 MG PO CHEW
81.0000 mg | CHEWABLE_TABLET | Freq: Every day | ORAL | Status: DC
  Administered 2016-03-24 – 2016-04-06 (×14): 81 mg via ORAL
  Filled 2016-03-24 (×14): qty 1

## 2016-03-24 NOTE — H&P (Signed)
Referring Physician:  IOMA CHISMAR is an 65 y.o. female.                       Chief Complaint: Right leg swelling, ulcers and pain.  HPI: 65 year old female with known h/o CAD, GERD, Hypertension, gout, type II Diabetes mellitus and biventricular failure has right lower leg edema and ulcers not healing post oral antibiotic use 3 to 4 weeks ago.   Past Medical History:  Diagnosis Date  . CHF (congestive heart failure) (HCC)   . Coronary artery disease   . GERD (gastroesophageal reflux disease)   . Gout   . High cholesterol   . Hypertension   . Shortness of breath dyspnea   . Type II diabetes mellitus (HCC)       Past Surgical History:  Procedure Laterality Date  . CARDIAC CATHETERIZATION  2014  . CORONARY ANGIOPLASTY WITH STENT PLACEMENT  2013  . LEFT AND RIGHT HEART CATHETERIZATION WITH CORONARY ANGIOGRAM N/A 11/25/2011   Procedure: LEFT AND RIGHT HEART CATHETERIZATION WITH CORONARY ANGIOGRAM;  Surgeon: Robynn Pane, MD;  Location: [facility] CATH LAB;  Service: Cardiovascular;  Laterality: N/A;  . LEFT AND RIGHT HEART CATHETERIZATION WITH CORONARY ANGIOGRAM N/A 09/07/2012   Procedure: LEFT AND RIGHT HEART CATHETERIZATION WITH CORONARY ANGIOGRAM;  Surgeon: [name], MD;  Location: [facility] CATH LAB;  Service: Cardiovascular;  Laterality: N/A;  . PERCUTANEOUS CORONARY STENT INTERVENTION (PCI-S) Right 11/25/2011   Procedure: PERCUTANEOUS CORONARY STENT INTERVENTION (PCI-S);  Surgeon: Robynn Pane, MD;  Location: [facility] CATH LAB;  Service: Cardiovascular;  Laterality: Right;  . TONSILLECTOMY  1950's    History reviewed. No pertinent family history. Social History:  reports that she has quit smoking. Her smoking use included Cigarettes. She has a 10.00 pack-year smoking history. She has never used smokeless tobacco. She reports that she does not drink alcohol or use drugs.  Allergies:  Allergies  Allergen Reactions  . Bidil [Isosorb Dinitrate-Hydralazine] Itching and Other (See  Comments)    Jittery, feels like something is crawling    Medications Prior to Admission  Medication Sig Dispense Refill  . acetaminophen (TYLENOL) 500 MG tablet Take 500 mg by mouth every 6 (six) hours as needed for moderate pain or headache.    . albuterol (PROVENTIL HFA;VENTOLIN HFA) 108 (90 Base) MCG/ACT inhaler Inhale 2 puffs into the lungs every 6 (six) hours as needed for wheezing or shortness of breath.    . allopurinol (ZYLOPRIM) 100 MG tablet Take 1 tablet (100 mg total) by mouth daily. (Patient taking differently: Take 100 mg by mouth 2 (two) times daily. )    . amLODipine (NORVASC) 5 MG tablet Take 1 tablet (5 mg total) by mouth daily. 30 tablet 1  . aspirin 81 MG chewable tablet Chew 81 mg by mouth daily.    . Cholecalciferol (VITAMIN D3) 1000 units CAPS Take 1,000 Units by mouth every Monday.    . colchicine 0.6 MG tablet Take 1 tablet (0.6 mg total) by mouth daily. (Patient taking differently: Take 0.6 mg by mouth daily as needed (for gout). ) 30 tablet 0  . cycloSPORINE (RESTASIS) 0.05 % ophthalmic emulsion Place 1 drop into both eyes daily.    . digoxin (LANOXIN) 0.125 MG tablet Take 0.5 tablets (0.0625 mg total) by mouth daily. 15 tablet 1  . furosemide (LASIX) 80 MG tablet Take 1 tablet (80 mg total) by mouth 2 (two) times daily.    . isosorbide-hydrALAZINE (BIDIL) 20-37.5  MG tablet Take 1 tablet by mouth 3 (three) times daily as needed (for heart).    . metolazone (ZAROXOLYN) 5 MG tablet Take 5 mg by mouth daily as needed (for fluid).     . nitroGLYCERIN (NITROSTAT) 0.4 MG SL tablet Place 0.4 mg under the tongue every 5 (five) minutes as needed for chest pain.    . Olopatadine HCl (PAZEO) 0.7 % SOLN Apply 1 drop to eMarland Kitchenye every morning.    Marland Kitchen. omeprazole (PRILOSEC OTC) 20 MG tablet Take 20 mg by mouth daily as needed (for acid reflux).    Bertram Gala. Polyethyl Glycol-Propyl Glycol (SYSTANE ULTRA) 0.4-0.3 % SOLN Place 1 drop into both eyes daily.    . potassium chloride (K-DUR,KLOR-CON) 10  MEQ tablet Take 1 tablet (10 mEq total) by mouth every Monday, Wednesday, and Friday. (Patient taking differently: Take 10 mEq by mouth every Monday. )    . spironolactone (ALDACTONE) 25 MG tablet Take 25 mg by mouth daily.    . cephALEXin (KEFLEX) 500 MG capsule Take 1 capsule (500 mg total) by mouth 4 (four) times daily. (Patient not taking: Reported on 03/24/2016) 40 capsule 0  . linagliptin (TRADJENTA) 5 MG TABS tablet Take 1 tablet (5 mg total) by mouth daily. (Patient not taking: Reported on 02/26/2016) 30 tablet 1    Results for orders placed or performed during the hospital encounter of 03/24/16 (from the past 48 hour(s))  Glucose, capillary     Status: None   Collection Time: 03/24/16 11:19 AM  Result Value Ref Range   Glucose-Capillary 98 65 - 99 mg/dL   No results found.  Review Of Systems Constitutional: Negative for fever and chills. Positive weight gain off and on due to dietary non-compliance. HENT: Positive for wearing glasses and partial dentures. Respiratory: Positive for recurrent cough, shortness of breath. Cardiovascular: Positive leg swelling, chest pain and hypertension. Gastrointestinal: Positive for nausea and constipation. No diarrhea. Neurological: Negative for headaches, dizziness, numbness. Positive weakness. Genitourinary: No hematuria or kidney stone. Ext: Positive arthritis and leg edema with blisters.  Blood pressure (!) 166/102, pulse 98, temperature 97.4 F (36.3 C), temperature source Oral, resp. rate 17, height 5\' 5"  (1.651 m), weight 71.5 kg (157 lb 11.2 oz), SpO2 98 %. Physical Exam: General: Averagely built and nourished. In mild distress. HENT: Normocephalic, atraumatic. Brown eyes, conjunctivae-pink, Sclera: non-icteric. Neck: Full JVD at 90 degree angle. No bruit or thyromegaly. Lungs: Bilateral basal crackles. Heart: Regular rhythm. Normal S1 and S2. Grade II/VI systolic murmur. Ext: 2 + edema right lower leg more than left. Right lower leg  has multiple small ulcers, largest one measuring 2 inch in diameter and small blisters with redness and tenderness on palpation.  CNS: AxOx3. Moves all 4 extremities. Skin: War and dry.   Assessment/Plan Right lower leg cellulitis CAD Chronic biventricular systolic heart failure S/P stent in LAD DM, Ii Hypertension  Admit. IV antibiotics. Home medications.  [name],Liliya Fullenwider S, MD  03/24/2016, 3:06 PM

## 2016-03-24 NOTE — Consult Note (Addendum)
WOC Nurse wound consult note Reason for Consult: Right lower anterior leg ulcer x 2 Wound type: Full thickness ulcer.  Pt reports that she had a boil that was lanced and since then has had a non-healing ulcer complicated by diabetes.  Pt has been using hydrogen peroxide to clean ulcer.  Instructed pt not to use hydrogen peroxide to clean ulcer as it destroys new skin cells.  NS or soap and water is the best method to clean wounds. Pressure Ulcer POA: NA Measurement: 4 x 4.5 x  0.2 cm inferior, 1 x 1 x0.1 cm superior Wound bed: 90% yellow tissue with 10% red, bleeding tissue border Drainage (amount, consistency, odor) scant thin, clear with scant sanginous drainage, no odor Periwound: red, edematous tissue with fluid filled blisters scattered over shin Dressing procedure/placement/frequency:  Cleanse daily with NS.  Apply santyl ointment daily to wound base only.  Cover with ABD and wrap with kerlix to secure.  Elevate legs while sitting and in bed.  Discussed POC with patient and bedside nurse.  Re consult if needed, will not follow at this time. Thanks Henriette CombsSarah Robben Jagiello BSN, RN, [facility]

## 2016-03-25 LAB — BASIC METABOLIC PANEL
ANION GAP: 9 (ref 5–15)
BUN: 61 mg/dL — ABNORMAL HIGH (ref 6–20)
CO2: 25 mmol/L (ref 22–32)
Calcium: 9 mg/dL (ref 8.9–10.3)
Chloride: 104 mmol/L (ref 101–111)
Creatinine, Ser: 1.9 mg/dL — ABNORMAL HIGH (ref 0.44–1.00)
GFR, EST AFRICAN AMERICAN: 31 mL/min — AB (ref >60)
GFR, EST NON AFRICAN AMERICAN: 27 mL/min — AB (ref >60)
GLUCOSE: 109 mg/dL — AB (ref 65–99)
POTASSIUM: 3.5 mmol/L (ref 3.5–5.1)
Sodium: 138 mmol/L (ref 135–145)

## 2016-03-25 LAB — CBC
HEMATOCRIT: 44.4 % (ref 36.0–46.0)
HEMOGLOBIN: 14.4 g/dL (ref 12.0–15.0)
MCH: 26 pg (ref 26.0–34.0)
MCHC: 32.4 g/dL (ref 30.0–36.0)
MCV: 80.1 fL (ref 78.0–100.0)
Platelets: 162 10*3/uL (ref 150–400)
RBC: 5.54 MIL/uL — AB (ref 3.87–5.11)
RDW: 19.2 % — ABNORMAL HIGH (ref 11.5–15.5)
WBC: 4.8 10*3/uL (ref 4.0–10.5)

## 2016-03-25 MED ORDER — FUROSEMIDE 10 MG/ML IJ SOLN
40.0000 mg | Freq: Every day | INTRAMUSCULAR | Status: DC
  Administered 2016-03-25 – 2016-03-28 (×4): 40 mg via INTRAVENOUS
  Filled 2016-03-25 (×4): qty 4

## 2016-03-25 MED ORDER — DOPAMINE-DEXTROSE 3.2-5 MG/ML-% IV SOLN
2.5000 ug/kg/min | INTRAVENOUS | Status: DC
  Administered 2016-03-25 – 2016-03-31 (×5): 5 ug/kg/min via INTRAVENOUS
  Filled 2016-03-25 (×5): qty 250

## 2016-03-25 MED ORDER — MAGNESIUM OXIDE 400 (241.3 MG) MG PO TABS
400.0000 mg | ORAL_TABLET | Freq: Every day | ORAL | Status: DC
  Administered 2016-03-25 – 2016-04-06 (×13): 400 mg via ORAL
  Filled 2016-03-25 (×13): qty 1

## 2016-03-25 MED ORDER — POTASSIUM CHLORIDE CRYS ER 10 MEQ PO TBCR
10.0000 meq | EXTENDED_RELEASE_TABLET | Freq: Two times a day (BID) | ORAL | Status: DC
  Administered 2016-03-25 (×2): 10 meq via ORAL
  Filled 2016-03-25 (×2): qty 1

## 2016-03-25 NOTE — Progress Notes (Signed)
Ref: [name], MD   Subjective:  No new complaint. Afebrile. Small diuresis with lasix use. Chest x-ray negative for pulmonary edema but BNP is chronically elevated.  Objective:  Vital Signs in the last 24 hours: Temp:  [97.4 F (36.3 C)-98.2 F (36.8 C)] 97.6 F (36.4 C) (09/15 0545) Pulse Rate:  [72-98] 76 (09/15 0545) Cardiac Rhythm: Normal sinus rhythm (09/15 0700) Resp:  [16-17] 16 (09/15 0545) BP: (135-166)/(84-102) 145/84 (09/15 0545) SpO2:  [98 %-99 %] 99 % (09/15 0545) Weight:  [71.5 kg (157 lb 11.2 oz)-71.7 kg (158 lb)] 71.7 kg (158 lb) (09/15 0545)  Physical Exam: BP Readings from Last 1 Encounters:  03/25/16 (!) 145/84    Wt Readings from Last 1 Encounters:  03/25/16 71.7 kg (158 lb)    Weight change:   HEENT: [place]/AT, Eyes-Brown, PERL, EOMI, Conjunctiva-Pink, Sclera-Non-icteric Neck: + JVD, No bruit, Trachea midline. Lungs:  Clearing, Bilateral. Cardiac:  Regular rhythm, normal S1 and S2, no S3. II/VI systolic murmur. Abdomen:  Soft, non-tender. Extremities:  2 + edema present. No cyanosis. No clubbing. Right lower leg ulcers as before. CNS: AxOx3, Cranial nerves grossly intact, moves all 4 extremities.  Skin: Warm and dry.   Intake/Output from previous day: 09/14 0701 - 09/15 0700 In: 530 [P.O.:480; IV Piggyback:50] Out: 700 [Urine:700]    Lab Results: BMET    Component Value Date/Time   NA 138 03/25/2016 0519   NA 138 03/24/2016 1553   NA 137 02/26/2016 1220   K 3.5 03/25/2016 0519   K 3.4 (L) 03/24/2016 1553   K 4.5 02/26/2016 1220   CL 104 03/25/2016 0519   CL 103 03/24/2016 1553   CL 101 02/26/2016 1220   CO2 25 03/25/2016 0519   CO2 23 03/24/2016 1553   CO2 25 02/26/2016 1220   GLUCOSE 109 (H) 03/25/2016 0519   GLUCOSE 149 (H) 03/24/2016 1553   GLUCOSE 123 (H) 02/26/2016 1220   BUN 61 (H) 03/25/2016 0519   BUN 59 (H) 03/24/2016 1553   BUN 53 (H) 02/26/2016 1220   CREATININE 1.90 (H) 03/25/2016 0519   CREATININE 1.95 (H)  03/24/2016 1553   CREATININE 1.99 (H) 02/26/2016 1220   CALCIUM 9.0 03/25/2016 0519   CALCIUM 8.9 03/24/2016 1553   CALCIUM 9.2 02/26/2016 1220   GFRNONAA 27 (L) 03/25/2016 0519   GFRNONAA 26 (L) 03/24/2016 1553   GFRNONAA 26 (L) 02/26/2016 1220   GFRAA 31 (L) 03/25/2016 0519   GFRAA 30 (L) 03/24/2016 1553   GFRAA 30 (L) 02/26/2016 1220   CBC    Component Value Date/Time   WBC 4.8 03/25/2016 0519   RBC 5.54 (H) 03/25/2016 0519   HGB 14.4 03/25/2016 0519   HCT 44.4 03/25/2016 0519   PLT 162 03/25/2016 0519   MCV 80.1 03/25/2016 0519   MCH 26.0 03/25/2016 0519   MCHC 32.4 03/25/2016 0519   RDW 19.2 (H) 03/25/2016 0519   LYMPHSABS 1.6 03/24/2016 1553   MONOABS 0.4 03/24/2016 1553   EOSABS 0.0 03/24/2016 1553   BASOSABS 0.0 03/24/2016 1553   HEPATIC Function Panel  Recent Labs  05/25/15 1634 07/03/15 1618 03/24/16 1553  PROT 7.2 7.0 6.9   HEMOGLOBIN A1C No components found for: HGA1C,  MPG CARDIAC ENZYMES Lab Results  Component Value Date   CKTOTAL 59 09/07/2012   CKMB 3.2 09/07/2012   TROPONINI <0.03 07/04/2015   TROPONINI 0.03 07/03/2015   TROPONINI <0.03 07/03/2015   BNP No results for input(s): PROBNP in the last 8760 hours. TSH  No results for input(s): TSH in the last 8760 hours. CHOLESTEROL  Recent Labs  05/28/15 0654  CHOL 157    Scheduled Meds: . allopurinol  100 mg Oral BID  . amLODipine  5 mg Oral Daily  . aspirin  81 mg Oral Daily  . cefTRIAXone (ROCEPHIN)  IV  1 g Intravenous Q24H  . [START ON 03/28/2016] cholecalciferol  1,000 Units Oral Q Mon  . collagenase   Topical Daily  . cycloSPORINE  1 drop Both Eyes Daily  . digoxin  0.0625 mg Oral Daily  . enoxaparin (LOVENOX) injection  30 mg Subcutaneous Q24H  . furosemide  40 mg Intravenous Daily  . magnesium oxide  400 mg Oral Daily  . metolazone  5 mg Oral Daily  . potassium chloride  10 mEq Oral BID   Continuous Infusions: . 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  Dopamine without titration to improve diuresis. Add potassium.   LOS: 1 day    Orpah CobbAjay Eidan Muellner  MD  03/25/2016, 8:21 AM

## 2016-03-25 NOTE — Progress Notes (Signed)
Pt had 5 beats of V tach. Pt asymptomatic. Will cont to monitor pt.  

## 2016-03-25 NOTE — Plan of Care (Signed)
Problem: Health Behavior/Discharge Planning: Goal: Ability to manage health-related needs will improve Outcome: Progressing Patient admitted with CHF exacerbation and cellulitis with possible infection of right lower extremity wound. Wound consult was initiated on day shift; daily santyl dressings ordered and wound culture sent for growth analysis. Dressing in place at time of assessment was dry and intact; unable to assess wound. Patient receiving IV antibiotic coverage with rocephin 1 gm q24hrs; first dose also given on day shift. IV is intact, but showed evidence of bleeding on assessment earlier this shift; reassessed at 03:30 and showed no new evidence of continued bleeding or other problems. IV site rotation not needed at this time.  Maintaining sinus rhythm with incomplete bundle branch block and biphasic P-waves on telemetry. On chart review, these have been present since May, 2013. Patient afebrile with stable vital signs this shift. Blood pressure 135/85, pulse 72, temperature 98.2 F (36.8 C), temperature source Oral, resp. rate 16, SpO2 99 %. Last WBC count is normal at 4.5 with no noted left shift. X-ray of right tibia/fibula negative for concern of osteomyelitis. She has had no complaint of pain at rest.  Regarding her heart failure, patient has relatively limited knowledge of basic principles. She does not appear ready to learn; was not conversant earlier this evening and wants to sleep. Case manager referral is warranted to assess for ongoing educational and home health needs.  Will place order per protocol.  Continuing to monitor.

## 2016-03-26 LAB — BASIC METABOLIC PANEL
ANION GAP: 12 (ref 5–15)
BUN: 52 mg/dL — AB (ref 6–20)
CALCIUM: 9.3 mg/dL (ref 8.9–10.3)
CO2: 27 mmol/L (ref 22–32)
CREATININE: 1.41 mg/dL — AB (ref 0.44–1.00)
Chloride: 99 mmol/L — ABNORMAL LOW (ref 101–111)
GFR calc Af Amer: 45 mL/min — ABNORMAL LOW (ref >60)
GFR, EST NON AFRICAN AMERICAN: 38 mL/min — AB (ref >60)
GLUCOSE: 169 mg/dL — AB (ref 65–99)
POTASSIUM: 3.4 mmol/L — AB (ref 3.5–5.1)
Sodium: 138 mmol/L (ref 135–145)

## 2016-03-26 LAB — MAGNESIUM: Magnesium: 1.8 mg/dL (ref 1.7–2.4)

## 2016-03-26 MED ORDER — POTASSIUM CHLORIDE CRYS ER 20 MEQ PO TBCR
20.0000 meq | EXTENDED_RELEASE_TABLET | Freq: Two times a day (BID) | ORAL | Status: DC
  Administered 2016-03-26 – 2016-04-03 (×17): 20 meq via ORAL
  Filled 2016-03-26 (×17): qty 1

## 2016-03-26 MED ORDER — INFLUENZA VAC SPLIT QUAD 0.5 ML IM SUSY
0.5000 mL | PREFILLED_SYRINGE | INTRAMUSCULAR | Status: AC
  Administered 2016-03-27: 0.5 mL via INTRAMUSCULAR

## 2016-03-26 MED ORDER — CARVEDILOL 3.125 MG PO TABS
3.1250 mg | ORAL_TABLET | Freq: Two times a day (BID) | ORAL | Status: DC
  Administered 2016-03-26 – 2016-04-06 (×23): 3.125 mg via ORAL
  Filled 2016-03-26 (×23): qty 1

## 2016-03-26 NOTE — Progress Notes (Signed)
Subjective: Patient denies any chest pain states breathing and leg swelling has improved. Noted to have occasional few beats of nonsustained VT on the monitor asymptomatic. Diuresing well with dopamine    Objective:  Vital Signs in the last 24 hours: Temp:  [97.3 F (36.3 C)-98.5 F (36.9 C)] 97.3 F (36.3 C) (09/16 0434) Pulse Rate:  [92-97] 92 (09/16 0434) Resp:  [16] 16 (09/16 0434) BP: (133-165)/(74-91) 137/80 (09/16 0434) SpO2:  [96 %-99 %] 96 % (09/16 0434) Weight:  [152 lb 6.4 oz (69.1 kg)] 152 lb 6.4 oz (69.1 kg) (09/16 0434)  Intake/Output from previous day: 09/15 0701 - 09/16 0700 In: 600 [P.O.:600] Out: 5500 [Urine:5500] Intake/Output from this shift: Total I/O In: -  Out: 350 [Urine:350]  Physical Exam: General appearance: alert Neck: no adenopathy, no carotid bruit, no JVD and supple, symmetrical, trachea midline Lungs: Decreased breath sound at bases with faint rales Heart: regular rate and rhythm, S1, S2 normal and 2/6 systolic murmur noted Abdomen: soft, non-tender; bowel sounds normal; no masses,  no organomegaly Extremities: No clubbing cyanosis 2+ edema right leg more than left noted surgical dressing right leg  Lab Results:  Recent Labs  03/24/16 1553 03/25/16 0519  WBC 4.5 4.8  HGB 14.9 14.4  PLT 177 162    Recent Labs  03/25/16 0519 03/26/16 0458  NA 138 138  K 3.5 3.4*  CL 104 99*  CO2 25 27  GLUCOSE 109* 169*  BUN 61* 52*  CREATININE 1.90* 1.41*   No results for input(s): TROPONINI in the last 72 hours.  Invalid input(s): CK, MB Hepatic Function Panel  Recent Labs  03/24/16 1553  PROT 6.9  ALBUMIN 3.0*  AST 28  ALT 15  ALKPHOS 78  BILITOT 3.5*   No results for input(s): CHOL in the last 72 hours. No results for input(s): PROTIME in the last 72 hours.  Imaging: Imaging results have been reviewed and Dg Chest 2 View  Result Date: 03/24/2016 CLINICAL DATA:  65 year old female with shortness of breath. Initial encounter.  EXAM: CHEST  2 VIEW COMPARISON:  02/26/2016 and earlier. FINDINGS: Stable moderate to severe cardiomegaly. Calcified aortic atherosclerosis. Visualized tracheal air column is within normal limits. Stable lung volumes. No pneumothorax, pulmonary edema, pleural effusion or confluent pulmonary opacity. No acute osseous abnormality identified. IMPRESSION: Stable cardiomegaly. No acute cardiopulmonary abnormality. Calcified aortic atherosclerosis. Electronically Signed   By: Odessa FlemingH  Rao M.D.   On: 03/24/2016 20:59   Dg Tibia/fibula Right  Result Date: 03/24/2016 CLINICAL DATA:  65 year old female with right anterior tib-fib wound for 1 month. Initial encounter. EXAM: RIGHT TIBIA AND FIBULA - 2 VIEW COMPARISON:  Right knee series 9 12/29/2010. FINDINGS: AP and cross-table lateral views. Generalized subcutaneous stranding in the lower right leg. Right tibia and fibula remain intact. Calcified peripheral vascular disease. No subcutaneous gas identified. Alignment at the right knee and ankle appears within normal limits. IMPRESSION: No acute osseous abnormality involving the right tib-fib. Electronically Signed   By: Odessa FlemingH  Chalupa M.D.   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 Plan DC amlodipine Start BiDil as per orders Replace K Check labs in a.m.  LOS: 2 days    Rinaldo CloudHarwani, Yarden Manuelito 03/26/2016, 8:42 AM

## 2016-03-26 NOTE — Progress Notes (Signed)
Patient receiving IV dopamine as adjuvant therapy to oral diuretic overnight with no problems noted. Excellent urine output throughout the day yesterday and overnight tonight. Continuing to experience short, asymptomatic bursts of VT on telemetry, otherwise maintaining sinus rhythm with incomplete bundle branch block as previously noted. Biphasic p-waves remain present with particular left atrial prominence. No JVD present earlier this evening, which is a change from last night. Bilateral lower extremity edema still present, R>L, without any appreciable reduction yet.  Patient ambulating short distances in room with standby assistance to toilet, but spending most of her time up in chair.  Depressed mood noted; patient was not conversant with me.  Will need focused education on heart failure basics and home management strategies prior to discharge.

## 2016-03-27 LAB — BASIC METABOLIC PANEL
Anion gap: 13 (ref 5–15)
BUN: 44 mg/dL — AB (ref 6–20)
CHLORIDE: 99 mmol/L — AB (ref 101–111)
CO2: 26 mmol/L (ref 22–32)
CREATININE: 1.43 mg/dL — AB (ref 0.44–1.00)
Calcium: 9 mg/dL (ref 8.9–10.3)
GFR calc Af Amer: 44 mL/min — ABNORMAL LOW (ref >60)
GFR calc non Af Amer: 38 mL/min — ABNORMAL LOW (ref >60)
GLUCOSE: 168 mg/dL — AB (ref 65–99)
Potassium: 3.7 mmol/L (ref 3.5–5.1)
Sodium: 138 mmol/L (ref 135–145)

## 2016-03-27 LAB — MAGNESIUM: Magnesium: 1.9 mg/dL (ref 1.7–2.4)

## 2016-03-27 LAB — BRAIN NATRIURETIC PEPTIDE: B Natriuretic Peptide: 1578.5 pg/mL — ABNORMAL HIGH (ref 0.0–100.0)

## 2016-03-27 MED ORDER — ENOXAPARIN SODIUM 40 MG/0.4ML ~~LOC~~ SOLN
40.0000 mg | SUBCUTANEOUS | Status: DC
  Administered 2016-03-27 – 2016-04-05 (×9): 40 mg via SUBCUTANEOUS
  Filled 2016-03-27 (×10): qty 0.4

## 2016-03-27 NOTE — Progress Notes (Signed)
Subjective:  Denies any chest pain states breathing and leg swelling slowly improving. Diuresing well but still markedly volume overloaded. States has taken BiDil in the past but could not tolerate due to itching.  Objective:  Vital Signs in the last 24 hours: Temp:  [97.7 F (36.5 C)-98.2 F (36.8 C)] 97.7 F (36.5 C) (09/17 0510) Pulse Rate:  [63-94] 74 (09/17 0510) Resp:  [16-18] 16 (09/17 0510) BP: (129-149)/(65-86) 136/74 (09/17 0510) SpO2:  [97 %-98 %] 98 % (09/17 0510) Weight:  [152 lb 9.6 oz (69.2 kg)] 152 lb 9.6 oz (69.2 kg) (09/17 0510)  Intake/Output from previous day: 09/16 0701 - 09/17 0700 In: 1637.1 [P.O.:1260; I.V.:277.1; IV Piggyback:100] Out: 2925 [Urine:2925] Intake/Output from this shift: No intake/output data recorded.  Physical Exam: Neck: no adenopathy, no carotid bruit, no JVD and supple, symmetrical, trachea midline Lungs: Decreased breath sound at bases with faint rales Heart: regular rate and rhythm, S1, S2 normal and 2/6 systolic murmur and S3 gallop noted Abdomen: soft, non-tender; bowel sounds normal; no masses,  no organomegaly Extremities: No clubbing cyanosis 2+ edema right more than left leg noted right leg dressing noted  Lab Results:  Recent Labs  03/24/16 1553 03/25/16 0519  WBC 4.5 4.8  HGB 14.9 14.4  PLT 177 162    Recent Labs  03/26/16 0458 03/27/16 0517  NA 138 138  K 3.4* 3.7  CL 99* 99*  CO2 27 26  GLUCOSE 169* 168*  BUN 52* 44*  CREATININE 1.41* 1.43*   No results for input(s): TROPONINI in the last 72 hours.  Invalid input(s): CK, MB Hepatic Function Panel  Recent Labs  03/24/16 1553  PROT 6.9  ALBUMIN 3.0*  AST 28  ALT 15  ALKPHOS 78  BILITOT 3.5*   No results for input(s): CHOL in the last 72 hours. No results for input(s): PROTIME in the last 72 hours.  Imaging: Imaging results have been reviewed and No results found.  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 Plan Continue present management Restrict her to 1 L per 24 hours   LOS: 3 days    Rinaldo CloudHarwani, Kamylle Axelson 03/27/2016, 10:28 AM

## 2016-03-28 LAB — AEROBIC CULTURE W GRAM STAIN (SUPERFICIAL SPECIMEN): Culture: NO GROWTH

## 2016-03-28 LAB — AEROBIC CULTURE  (SUPERFICIAL SPECIMEN)

## 2016-03-28 MED ORDER — FUROSEMIDE 10 MG/ML IJ SOLN
40.0000 mg | Freq: Two times a day (BID) | INTRAMUSCULAR | Status: DC
  Administered 2016-03-28 – 2016-04-01 (×8): 40 mg via INTRAVENOUS
  Filled 2016-03-28 (×8): qty 4

## 2016-03-28 NOTE — Progress Notes (Signed)
Cleansed affected areas; one wound on right lower extremity size of half dollar its open with serous excudate with Normal Saline. Multiple blisters surrounding the lower right extremity dime size open wound serous exudate. Applied ointment to affected areas per MD protocol. Applied telfa, 4x4's, and kerlix to wrap right lower leg.

## 2016-03-28 NOTE — Progress Notes (Signed)
Ref: Manroop Jakubowicz S, MD   Subjective:  Feeling same but less short of breath. Somewhat sleepy after medication intake in AM.  Objective:  Vital Signs in the last 24 hours: Temp:  [97.5 F (36.4 C)-98.3 F (36.8 C)] 97.8 F (36.6 C) (09/18 1457) Pulse Rate:  [74-90] 78 (09/18 1457) Cardiac Rhythm: Normal sinus rhythm (09/18 0750) Resp:  [16] 16 (09/18 0513) BP: (142-149)/(77-84) 142/82 (09/18 1457) SpO2:  [99 %-100 %] 100 % (09/18 1457) Weight:  [70.3 kg (154 lb 14.4 oz)] 70.3 kg (154 lb 14.4 oz) (09/18 0513)  Physical Exam: BP Readings from Last 1 Encounters:  03/28/16 (!) 142/82    Wt Readings from Last 1 Encounters:  03/28/16 70.3 kg (154 lb 14.4 oz)    Weight change: 1.043 kg (2 lb 4.8 oz)  HEENT: [place]/AT, Eyes-Brown, PERL, EOMI, Conjunctiva-Pink, Sclera-Non-icteric Neck: + JVD, No bruit, Trachea midline. Lungs:  Bilateral basal crackles. Cardiac:  Regular rhythm, normal S1 and S2, no S3. II/VI systolic murmur. Abdomen:  Soft, non-tender. Extremities:  2 + edema present. No cyanosis. No clubbing. 2 inch diameter ulcer with serous exudate.and dressing as before. CNS: AxOx3, Cranial nerves grossly intact, moves all 4 extremities.  Skin: Warm and dry.   Intake/Output from previous day: 09/17 0701 - 09/18 0700 In: 704.2 [P.O.:480; I.V.:174.2; IV Piggyback:50] Out: 350 [Urine:350]    Lab Results: BMET    Component Value Date/Time   NA 138 03/27/2016 0517   NA 138 03/26/2016 0458   NA 138 03/25/2016 0519   K 3.7 03/27/2016 0517   K 3.4 (L) 03/26/2016 0458   K 3.5 03/25/2016 0519   CL 99 (L) 03/27/2016 0517   CL 99 (L) 03/26/2016 0458   CL 104 03/25/2016 0519   CO2 26 03/27/2016 0517   CO2 27 03/26/2016 0458   CO2 25 03/25/2016 0519   GLUCOSE 168 (H) 03/27/2016 0517   GLUCOSE 169 (H) 03/26/2016 0458   GLUCOSE 109 (H) 03/25/2016 0519   BUN 44 (H) 03/27/2016 0517   BUN 52 (H) 03/26/2016 0458   BUN 61 (H) 03/25/2016 0519   CREATININE 1.43 (H) 03/27/2016 0517   CREATININE 1.41 (H) 03/26/2016 0458   CREATININE 1.90 (H) 03/25/2016 0519   CALCIUM 9.0 03/27/2016 0517   CALCIUM 9.3 03/26/2016 0458   CALCIUM 9.0 03/25/2016 0519   GFRNONAA 38 (L) 03/27/2016 0517   GFRNONAA 38 (L) 03/26/2016 0458   GFRNONAA 27 (L) 03/25/2016 0519   GFRAA 44 (L) 03/27/2016 0517   GFRAA 45 (L) 03/26/2016 0458   GFRAA 31 (L) 03/25/2016 0519   CBC    Component Value Date/Time   WBC 4.8 03/25/2016 0519   RBC 5.54 (H) 03/25/2016 0519   HGB 14.4 03/25/2016 0519   HCT 44.4 03/25/2016 0519   PLT 162 03/25/2016 0519   MCV 80.1 03/25/2016 0519   MCH 26.0 03/25/2016 0519   MCHC 32.4 03/25/2016 0519   RDW 19.2 (H) 03/25/2016 0519   LYMPHSABS 1.6 03/24/2016 1553   MONOABS 0.4 03/24/2016 1553   EOSABS 0.0 03/24/2016 1553   BASOSABS 0.0 03/24/2016 1553   HEPATIC Function Panel  Recent Labs  05/25/15 1634 07/03/15 1618 03/24/16 1553  PROT 7.2 7.0 6.9   HEMOGLOBIN A1C No components found for: HGA1C,  MPG CARDIAC ENZYMES Lab Results  Component Value Date   CKTOTAL 59 09/07/2012   CKMB 3.2 09/07/2012   TROPONINI <0.03 07/04/2015   TROPONINI 0.03 07/03/2015   TROPONINI <0.03 07/03/2015   BNP No results for input(s):  PROBNP in the last 8760 hours. TSH No results for input(s): TSH in the last 8760 hours. CHOLESTEROL  Recent Labs  05/28/15 0654  CHOL 157    Scheduled Meds: . allopurinol  100 mg Oral BID  . aspirin  81 mg Oral Daily  . carvedilol  3.125 mg Oral BID WC  . cefTRIAXone (ROCEPHIN)  IV  1 g Intravenous Q24H  . cholecalciferol  1,000 Units Oral Q Mon  . collagenase   Topical Daily  . cycloSPORINE  1 drop Both Eyes Daily  . digoxin  0.0625 mg Oral Daily  . enoxaparin (LOVENOX) injection  40 mg Subcutaneous Q24H  . furosemide  40 mg Intravenous BID  . magnesium oxide  400 mg Oral Daily  . metolazone  5 mg Oral Daily  . potassium chloride  20 mEq Oral BID   Continuous Infusions: . 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 Continue diuresis.   LOS: 4 days    Orpah CobbAjay Makiyah Zentz  MD  03/28/2016, 5:56 PM

## 2016-03-29 LAB — BASIC METABOLIC PANEL
Anion gap: 10 (ref 5–15)
BUN: 40 mg/dL — AB (ref 6–20)
CALCIUM: 9.2 mg/dL (ref 8.9–10.3)
CO2: 28 mmol/L (ref 22–32)
CREATININE: 1.26 mg/dL — AB (ref 0.44–1.00)
Chloride: 98 mmol/L — ABNORMAL LOW (ref 101–111)
GFR calc non Af Amer: 44 mL/min — ABNORMAL LOW (ref >60)
GFR, EST AFRICAN AMERICAN: 51 mL/min — AB (ref >60)
Glucose, Bld: 140 mg/dL — ABNORMAL HIGH (ref 65–99)
Potassium: 4.2 mmol/L (ref 3.5–5.1)
SODIUM: 136 mmol/L (ref 135–145)

## 2016-03-29 LAB — DIGOXIN LEVEL: Digoxin Level: 0.3 ng/mL — ABNORMAL LOW (ref 0.8–2.0)

## 2016-03-29 NOTE — Progress Notes (Signed)
Pharmacist Heart Failure Core Measure Documentation  Assessment: [name] has an EF documented as 25-30% on 05/26/2015 by Echo.  Rationale: Heart failure patients with left ventricular systolic dysfunction (LVSD) and an EF < 40% should be prescribed an angiotensin converting enzyme inhibitor (ACEI) or angiotensin receptor blocker (ARB) at discharge unless a contraindication is documented in the medical record.  This patient is not currently on an ACEI or ARB for HF.  This note is being placed in the record in order to provide documentation that a contraindication to the use of these agents is present for this encounter.  ACE Inhibitor or Angiotensin Receptor Blocker is contraindicated (specify all that apply)  []   ACEI allergy AND ARB allergy []   Angioedema []   Moderate or severe aortic stenosis []   Hyperkalemia []   Hypotension []   Renal artery stenosis [x]   Worsening renal function, preexisting renal disease or dysfunction   Harland Germanndrew Nisha Dhami, Pharm D 03/29/2016 12:54 PM

## 2016-03-29 NOTE — Progress Notes (Addendum)
Ref: [name], MD   Subjective:  Feeling same in leg. Breathing better. Afebrile.  Objective:  Vital Signs in the last 24 hours: Temp:  [97.7 F (36.5 C)-98.3 F (36.8 C)] 97.7 F (36.5 C) (09/19 0500) Pulse Rate:  [62-91] 91 (09/19 0500) Cardiac Rhythm: Normal sinus rhythm (09/18 1900) Resp:  [17-37] 37 (09/19 0500) BP: (121-159)/(57-96) 159/96 (09/19 0500) SpO2:  [98 %-100 %] 98 % (09/19 0500) Weight:  [68.7 kg (151 lb 8 oz)] 68.7 kg (151 lb 8 oz) (09/19 0500)  Physical Exam: BP Readings from Last 1 Encounters:  03/29/16 (!) 159/96    Wt Readings from Last 1 Encounters:  03/29/16 68.7 kg (151 lb 8 oz)    Weight change: -1.542 kg (-3 lb 6.4 oz)  HEENT: [place]/AT, Eyes-Brown, PERL, EOMI, Conjunctiva-Pink, Sclera-Non-icteric Neck: No JVD at 90 degree angle, No bruit, Trachea midline. Lungs:  Clearing, Bilateral. Cardiac:  Regular rhythm, normal S1 and S2, no S3. II/VI systolic murmur. Abdomen:  Soft, non-tender. Extremities:  2 + edema present. No cyanosis. No clubbing. Right lower leg dressing with dried up stains. CNS: AxOx3, Cranial nerves grossly intact, moves all 4 extremities.  Skin: Warm and dry.   Intake/Output from previous day: 09/18 0701 - 09/19 0700 In: 919.5 [P.O.:702; I.V.:167.5; IV Piggyback:50] Out: 3025 [Urine:3025]    Lab Results: BMET    Component Value Date/Time   NA 136 03/29/2016 0357   NA 138 03/27/2016 0517   NA 138 03/26/2016 0458   K 4.2 03/29/2016 0357   K 3.7 03/27/2016 0517   K 3.4 (L) 03/26/2016 0458   CL 98 (L) 03/29/2016 0357   CL 99 (L) 03/27/2016 0517   CL 99 (L) 03/26/2016 0458   CO2 28 03/29/2016 0357   CO2 26 03/27/2016 0517   CO2 27 03/26/2016 0458   GLUCOSE 140 (H) 03/29/2016 0357   GLUCOSE 168 (H) 03/27/2016 0517   GLUCOSE 169 (H) 03/26/2016 0458   BUN 40 (H) 03/29/2016 0357   BUN 44 (H) 03/27/2016 0517   BUN 52 (H) 03/26/2016 0458   CREATININE 1.26 (H) 03/29/2016 0357   CREATININE 1.43 (H) 03/27/2016 0517    CREATININE 1.41 (H) 03/26/2016 0458   CALCIUM 9.2 03/29/2016 0357   CALCIUM 9.0 03/27/2016 0517   CALCIUM 9.3 03/26/2016 0458   GFRNONAA 44 (L) 03/29/2016 0357   GFRNONAA 38 (L) 03/27/2016 0517   GFRNONAA 38 (L) 03/26/2016 0458   GFRAA 51 (L) 03/29/2016 0357   GFRAA 44 (L) 03/27/2016 0517   GFRAA 45 (L) 03/26/2016 0458   CBC    Component Value Date/Time   WBC 4.8 03/25/2016 0519   RBC 5.54 (H) 03/25/2016 0519   HGB 14.4 03/25/2016 0519   HCT 44.4 03/25/2016 0519   PLT 162 03/25/2016 0519   MCV 80.1 03/25/2016 0519   MCH 26.0 03/25/2016 0519   MCHC 32.4 03/25/2016 0519   RDW 19.2 (H) 03/25/2016 0519   LYMPHSABS 1.6 03/24/2016 1553   MONOABS 0.4 03/24/2016 1553   EOSABS 0.0 03/24/2016 1553   BASOSABS 0.0 03/24/2016 1553   HEPATIC Function Panel  Recent Labs  05/25/15 1634 07/03/15 1618 03/24/16 1553  PROT 7.2 7.0 6.9   HEMOGLOBIN A1C No components found for: HGA1C,  MPG CARDIAC ENZYMES Lab Results  Component Value Date   CKTOTAL 59 09/07/2012   CKMB 3.2 09/07/2012   TROPONINI <0.03 07/04/2015   TROPONINI 0.03 07/03/2015   TROPONINI <0.03 07/03/2015   BNP No results for input(s): PROBNP in the last  8760 hours. TSH No results for input(s): TSH in the last 8760 hours. CHOLESTEROL  Recent Labs  05/28/15 0654  CHOL 157    Scheduled Meds: . allopurinol  100 mg Oral BID  . aspirin  81 mg Oral Daily  . carvedilol  3.125 mg Oral BID WC  . cefTRIAXone (ROCEPHIN)  IV  1 g Intravenous Q24H  . cholecalciferol  1,000 Units Oral Q Mon  . collagenase   Topical Daily  . cycloSPORINE  1 drop Both Eyes Daily  . digoxin  0.0625 mg Oral Daily  . enoxaparin (LOVENOX) injection  40 mg Subcutaneous Q24H  . furosemide  40 mg Intravenous BID  . magnesium oxide  400 mg Oral Daily  . metolazone  5 mg Oral Daily  . potassium chloride  20 mEq Oral BID   Continuous Infusions: . DOPamine 5 mcg/kg/min (03/27/16 2345)   PRN Meds:.acetaminophen,  albuterol  Assessment/Plan: Right lower leg cellulitis CAD Acute on chronic biventricular systolic failure S/P stent in LAD DM, II CKD, stage III Hypertension  Continue diuresis and IV antibiotic.    LOS: 5 days    Orpah CobbAjay Rozella Servello  MD  03/29/2016, 8:47 AM

## 2016-03-30 MED ORDER — VANCOMYCIN HCL 10 G IV SOLR
1250.0000 mg | Freq: Once | INTRAVENOUS | Status: AC
  Administered 2016-03-30: 1250 mg via INTRAVENOUS
  Filled 2016-03-30: qty 1250

## 2016-03-30 MED ORDER — VANCOMYCIN HCL 10 G IV SOLR
1250.0000 mg | INTRAVENOUS | Status: DC
  Administered 2016-03-31 – 2016-04-02 (×3): 1250 mg via INTRAVENOUS
  Filled 2016-03-30 (×4): qty 1250

## 2016-03-30 NOTE — Progress Notes (Signed)
ANTIBIOTIC CONSULT NOTE - INITIAL  Pharmacy Consult for vancomycin Indication: wound infection  Allergies  Allergen Reactions  . Bidil [Isosorb Dinitrate-Hydralazine] Itching and Other (See Comments)    Jittery, feels like something is crawling    Patient Measurements: Height: 5\' 5"  (165.1 cm) Weight: 151 lb 1.6 oz (68.5 kg) IBW/kg (Calculated) : 57  Vital Signs: Temp: 98.5 F (36.9 C) (09/20 0500) Temp Source: Oral (09/20 0500) BP: 150/89 (09/20 0500) Pulse Rate: 84 (09/20 0500) Intake/Output from previous day: 09/19 0701 - 09/20 0700 In: 433.7 [P.O.:320; I.V.:73.7] Out: 2200 [Urine:2200] Intake/Output from this shift: Total I/O In: 120 [P.O.:120] Out: -   Labs:  Recent Labs  03/29/16 0357  CREATININE 1.26*   Estimated Creatinine Clearance: 43.9 mL/min (by C-G formula based on SCr of 1.26 mg/dL (H)). No results for input(s): VANCOTROUGH, VANCOPEAK, VANCORANDOM, GENTTROUGH, GENTPEAK, GENTRANDOM, TOBRATROUGH, TOBRAPEAK, TOBRARND, AMIKACINPEAK, AMIKACINTROU, AMIKACIN in the last 72 hours.   Microbiology: Recent Results (from the past 720 hour(s))  Aerobic Culture (superficial specimen)     Status: None   Collection Time: 03/24/16  2:03 PM  Result Value Ref Range Status   Specimen Description WOUND RIGHT LEG  Final   Special Requests NONE  Final   Gram Stain   Final    RARE WBC PRESENT, PREDOMINANTLY PMN NO ORGANISMS SEEN    Culture NO GROWTH 2 DAYS  Final   Report Status 03/28/2016 FINAL  Final    Medical History: Past Medical History:  Diagnosis Date  . CHF (congestive heart failure) (HCC)   . Coronary artery disease   . GERD (gastroesophageal reflux disease)   . Gout   . High cholesterol   . Hypertension   . Shortness of breath dyspnea   . Type II diabetes mellitus (HCC)     Medications:  Prescriptions Prior to Admission  Medication Sig Dispense Refill Last Dose  . acetaminophen (TYLENOL) 500 MG tablet Take 500 mg by mouth every 6 (six) hours  as needed for moderate pain or headache.   PRN  . albuterol (PROVENTIL HFA;VENTOLIN HFA) 108 (90 Base) MCG/ACT inhaler Inhale 2 puffs into the lungs every 6 (six) hours as needed for wheezing or shortness of breath.   PRN  . allopurinol (ZYLOPRIM) 100 MG tablet Take 1 tablet (100 mg total) by mouth daily. (Patient taking differently: Take 100 mg by mouth 2 (two) times daily. )   03/23/2016 at Unknown time  . amLODipine (NORVASC) 5 MG tablet Take 1 tablet (5 mg total) by mouth daily. 30 tablet 1 03/23/2016 at Unknown time  . aspirin 81 MG chewable tablet Chew 81 mg by mouth daily.   03/23/2016 at Unknown time  . Cholecalciferol (VITAMIN D3) 1000 units CAPS Take 1,000 Units by mouth every Monday.   03/21/2016  . colchicine 0.6 MG tablet Take 1 tablet (0.6 mg total) by mouth daily. (Patient taking differently: Take 0.6 mg by mouth daily as needed (for gout). ) 30 tablet 0 PRN  . cycloSPORINE (RESTASIS) 0.05 % ophthalmic emulsion Place 1 drop into both eyes daily.   03/23/2016 at Unknown time  . digoxin (LANOXIN) 0.125 MG tablet Take 0.5 tablets (0.0625 mg total) by mouth daily. 15 tablet 1 03/23/2016 at Unknown time  . furosemide (LASIX) 80 MG tablet Take 1 tablet (80 mg total) by mouth 2 (two) times daily.   03/23/2016 at Unknown time  . isosorbide-hydrALAZINE (BIDIL) 20-37.5 MG tablet Take 1 tablet by mouth 3 (three) times daily as needed (for heart).  03/23/2016 at Unknown time  . metolazone (ZAROXOLYN) 5 MG tablet Take 5 mg by mouth daily as needed (for fluid).    PRN  . nitroGLYCERIN (NITROSTAT) 0.4 MG SL tablet Place 0.4 mg under the tongue every 5 (five) minutes as needed for chest pain.   PRN  . Olopatadine HCl (PAZEO) 0.7 % SOLN Apply 1 drop to eye every morning.   03/23/2016 at Unknown time  . omeprazole (PRILOSEC OTC) 20 MG tablet Take 20 mg by mouth daily as needed (for acid reflux).   PRN  . Polyethyl Glycol-Propyl Glycol (SYSTANE ULTRA) 0.4-0.3 % SOLN Place 1 drop into both eyes daily.   03/23/2016  at Unknown time  . potassium chloride (K-DUR,KLOR-CON) 10 MEQ tablet Take 1 tablet (10 mEq total) by mouth every Monday, Wednesday, and Friday. (Patient taking differently: Take 10 mEq by mouth every Monday. )   03/21/2016  . spironolactone (ALDACTONE) 25 MG tablet Take 25 mg by mouth daily.   03/23/2016 at Unknown time  . [DISCONTINUED] cephALEXin (KEFLEX) 500 MG capsule Take 1 capsule (500 mg total) by mouth 4 (four) times daily. (Patient not taking: Reported on 03/24/2016) 40 capsule 0 Completed Course at Unknown time  . [DISCONTINUED] linagliptin (TRADJENTA) 5 MG TABS tablet Take 1 tablet (5 mg total) by mouth daily. (Patient not taking: Reported on 02/26/2016) 30 tablet 1 Not Taking at Unknown time   Scheduled:  . allopurinol  100 mg Oral BID  . aspirin  81 mg Oral Daily  . carvedilol  3.125 mg Oral BID WC  . cefTRIAXone (ROCEPHIN)  IV  1 g Intravenous Q24H  . cholecalciferol  1,000 Units Oral Q Mon  . collagenase   Topical Daily  . cycloSPORINE  1 drop Both Eyes Daily  . digoxin  0.0625 mg Oral Daily  . enoxaparin (LOVENOX) injection  40 mg Subcutaneous Q24H  . furosemide  40 mg Intravenous BID  . magnesium oxide  400 mg Oral Daily  . metolazone  5 mg Oral Daily  . potassium chloride  20 mEq Oral BID  . vancomycin  1,250 mg Intravenous Once    Assessment: 65 yo female with R lower leg cellulitis on rocephin but wounds show poor healing. Pharmacy consulted to add vancomycin -SCr= 1.26, CrCl ~ 45  9/14 rocephin>>  9/14 wound- neg  Goal of Therapy:  Vancomycin trough level 10-15 mcg/ml  Plan:  -Vancomcyin 1250mg  IV q24hr -Will follow renal function, cultures and clinical progress  Harland German, Pharm D 03/30/2016 9:51 AM

## 2016-03-30 NOTE — Progress Notes (Signed)
Ref: Riana Tessmer S, MD   Subjective:  Right lower leg wound has less drainage but still looks same. Afebrile.   Objective:  Vital Signs in the last 24 hours: Temp:  [97.7 F (36.5 C)-98.5 F (36.9 C)] 98.5 F (36.9 C) (09/20 0500) Pulse Rate:  [63-84] 84 (09/20 0500) Cardiac Rhythm: Normal sinus rhythm (09/19 2009) Resp:  [18] 18 (09/20 0500) BP: (138-150)/(76-89) 150/89 (09/20 0500) SpO2:  [99 %-100 %] 100 % (09/20 0500) Weight:  [68.5 kg (151 lb 1.6 oz)] 68.5 kg (151 lb 1.6 oz) (09/20 0500)  Physical Exam: BP Readings from Last 1 Encounters:  03/30/16 (!) 150/89    Wt Readings from Last 1 Encounters:  03/30/16 68.5 kg (151 lb 1.6 oz)    Weight change: -0.181 kg (-6.4 oz)  HEENT: [place]/AT, Eyes-Brown, PERL, EOMI, Conjunctiva-Pink, Sclera-Non-icteric Neck: No JVD, No bruit, Trachea midline. Lungs:  Clearing, Bilateral. Cardiac:  Regular rhythm, normal S1 and S2, no S3.  Abdomen:  Soft, non-tender. Extremities:  2 + edema present. No cyanosis. No clubbing. Right lower leg 2" diameter large almost circular wound with minimal drainage. 1/2 wound superior to mid leg larger wound is also unchanged.  CNS: AxOx3, Cranial nerves grossly intact, moves all 4 extremities.  Skin: Warm and dry.   Intake/Output from previous day: 09/19 0701 - 09/20 0700 In: 433.7 [P.O.:320; I.V.:73.7] Out: 2200 [Urine:2200]    Lab Results: BMET    Component Value Date/Time   NA 136 03/29/2016 0357   NA 138 03/27/2016 0517   NA 138 03/26/2016 0458   K 4.2 03/29/2016 0357   K 3.7 03/27/2016 0517   K 3.4 (L) 03/26/2016 0458   CL 98 (L) 03/29/2016 0357   CL 99 (L) 03/27/2016 0517   CL 99 (L) 03/26/2016 0458   CO2 28 03/29/2016 0357   CO2 26 03/27/2016 0517   CO2 27 03/26/2016 0458   GLUCOSE 140 (H) 03/29/2016 0357   GLUCOSE 168 (H) 03/27/2016 0517   GLUCOSE 169 (H) 03/26/2016 0458   BUN 40 (H) 03/29/2016 0357   BUN 44 (H) 03/27/2016 0517   BUN 52 (H) 03/26/2016 0458   CREATININE 1.26 (H)  03/29/2016 0357   CREATININE 1.43 (H) 03/27/2016 0517   CREATININE 1.41 (H) 03/26/2016 0458   CALCIUM 9.2 03/29/2016 0357   CALCIUM 9.0 03/27/2016 0517   CALCIUM 9.3 03/26/2016 0458   GFRNONAA 44 (L) 03/29/2016 0357   GFRNONAA 38 (L) 03/27/2016 0517   GFRNONAA 38 (L) 03/26/2016 0458   GFRAA 51 (L) 03/29/2016 0357   GFRAA 44 (L) 03/27/2016 0517   GFRAA 45 (L) 03/26/2016 0458   CBC    Component Value Date/Time   WBC 4.8 03/25/2016 0519   RBC 5.54 (H) 03/25/2016 0519   HGB 14.4 03/25/2016 0519   HCT 44.4 03/25/2016 0519   PLT 162 03/25/2016 0519   MCV 80.1 03/25/2016 0519   MCH 26.0 03/25/2016 0519   MCHC 32.4 03/25/2016 0519   RDW 19.2 (H) 03/25/2016 0519   LYMPHSABS 1.6 03/24/2016 1553   MONOABS 0.4 03/24/2016 1553   EOSABS 0.0 03/24/2016 1553   BASOSABS 0.0 03/24/2016 1553   HEPATIC Function Panel  Recent Labs  05/25/15 1634 07/03/15 1618 03/24/16 1553  PROT 7.2 7.0 6.9   HEMOGLOBIN A1C No components found for: HGA1C,  MPG CARDIAC ENZYMES Lab Results  Component Value Date   CKTOTAL 59 09/07/2012   CKMB 3.2 09/07/2012   TROPONINI <0.03 07/04/2015   TROPONINI 0.03 07/03/2015   TROPONINI <0.03  07/03/2015   BNP No results for input(s): PROBNP in the last 8760 hours. TSH No results for input(s): TSH in the last 8760 hours. CHOLESTEROL  Recent Labs  05/28/15 0654  CHOL 157    Scheduled Meds: . allopurinol  100 mg Oral BID  . aspirin  81 mg Oral Daily  . carvedilol  3.125 mg Oral BID WC  . cefTRIAXone (ROCEPHIN)  IV  1 g Intravenous Q24H  . cholecalciferol  1,000 Units Oral Q Mon  . collagenase   Topical Daily  . cycloSPORINE  1 drop Both Eyes Daily  . digoxin  0.0625 mg Oral Daily  . enoxaparin (LOVENOX) injection  40 mg Subcutaneous Q24H  . furosemide  40 mg Intravenous BID  . magnesium oxide  400 mg Oral Daily  . metolazone  5 mg Oral Daily  . potassium chloride  20 mEq Oral BID   Continuous Infusions: . DOPamine 5 mcg/kg/min (03/30/16 0155)    PRN Meds:.acetaminophen, albuterol  Assessment/Plan: Right lower leg cellulitis with poorly healing wounds CAD Acute on chronic biventricular systolic failure S/P stent in LAD DM, II CKD, III Hypertension  Add vancomycin.   LOS: 6 days    Orpah CobbAjay Kaley Jutras  MD  03/30/2016, 9:21 AM

## 2016-03-31 LAB — CREATININE, SERUM
CREATININE: 1.32 mg/dL — AB (ref 0.44–1.00)
GFR, EST AFRICAN AMERICAN: 48 mL/min — AB (ref >60)
GFR, EST NON AFRICAN AMERICAN: 42 mL/min — AB (ref >60)

## 2016-03-31 NOTE — Care Management Note (Signed)
Case Management Note  Patient Details  Name: [name] MRN: [id number] Date of Birth: 1951/01/29  Subjective/Objective: Pt presented for R leg swelling, CHF. Pt continues on IV Lasix, IV Dobutamine and IV Vancomycin.                  Action/Plan: CM did speak with pt in regards to disposition needs and pt is without insurance- [facility]. Pt is agreeable to services. Case Manager did make referral and SOC to begin within 24-48 hours post d/c. MD [facility] need [facility] place [facility]. No further needs from CM @ this time.   Expected Discharge Date:                  Expected Discharge Plan:  Home w Home Health Services  In-House Referral:  NA  Discharge planning Services  CM Consult  Post Acute Care Choice:  Home Health Choice offered to:  Patient  DME Arranged:  N/A DME Agency:  NA  HH Arranged:  RN HH Agency:  [facility]  Status of Service:  Completed, signed off  If discussed at Long Length of Stay Meetings, dates discussed:    Additional Comments:  Gala LewandowskyGraves-Bigelow, Allie Ousley Kaye, RN 03/31/2016, 4:12 PM

## 2016-04-01 LAB — CBC
HEMATOCRIT: 46.8 % — AB (ref 36.0–46.0)
Hemoglobin: 15.2 g/dL — ABNORMAL HIGH (ref 12.0–15.0)
MCH: 26.5 pg (ref 26.0–34.0)
MCHC: 32.5 g/dL (ref 30.0–36.0)
MCV: 81.5 fL (ref 78.0–100.0)
PLATELETS: 200 10*3/uL (ref 150–400)
RBC: 5.74 MIL/uL — AB (ref 3.87–5.11)
RDW: 17.6 % — ABNORMAL HIGH (ref 11.5–15.5)
WBC: 4.2 10*3/uL (ref 4.0–10.5)

## 2016-04-01 LAB — BASIC METABOLIC PANEL
Anion gap: 8 (ref 5–15)
BUN: 32 mg/dL — ABNORMAL HIGH (ref 6–20)
CHLORIDE: 95 mmol/L — AB (ref 101–111)
CO2: 30 mmol/L (ref 22–32)
CREATININE: 1.28 mg/dL — AB (ref 0.44–1.00)
Calcium: 9.2 mg/dL (ref 8.9–10.3)
GFR calc non Af Amer: 43 mL/min — ABNORMAL LOW (ref >60)
GFR, EST AFRICAN AMERICAN: 50 mL/min — AB (ref >60)
Glucose, Bld: 208 mg/dL — ABNORMAL HIGH (ref 65–99)
POTASSIUM: 4.2 mmol/L (ref 3.5–5.1)
SODIUM: 133 mmol/L — AB (ref 135–145)

## 2016-04-01 LAB — MAGNESIUM: Magnesium: 1.6 mg/dL — ABNORMAL LOW (ref 1.7–2.4)

## 2016-04-01 MED ORDER — FUROSEMIDE 10 MG/ML IJ SOLN
40.0000 mg | Freq: Every day | INTRAMUSCULAR | Status: DC
  Administered 2016-04-02: 40 mg via INTRAVENOUS
  Filled 2016-04-01: qty 4

## 2016-04-01 MED ORDER — DOCUSATE SODIUM 100 MG PO CAPS
100.0000 mg | ORAL_CAPSULE | Freq: Two times a day (BID) | ORAL | Status: DC
  Administered 2016-04-01 – 2016-04-06 (×3): 100 mg via ORAL
  Filled 2016-04-01 (×9): qty 1

## 2016-04-01 MED ORDER — DOCUSATE SODIUM 100 MG PO CAPS: 100.0000 mg | ORAL_CAPSULE | Freq: Two times a day (BID) | ORAL | Status: DC | PRN

## 2016-04-01 MED ORDER — MAGNESIUM SULFATE 50 % IJ SOLN
3.0000 g | Freq: Once | INTRAVENOUS | Status: AC
  Administered 2016-04-01: 3 g via INTRAVENOUS
  Filled 2016-04-01: qty 6

## 2016-04-01 MED ORDER — BISACODYL 10 MG RE SUPP
10.0000 mg | Freq: Once | RECTAL | Status: DC
  Filled 2016-04-01: qty 1

## 2016-04-01 NOTE — Progress Notes (Signed)
Late entry: Ref: [name],Irven Ingalsbe S, MD   Subjective:  Feeling better. Wound is drying up per patient. Afebrile.  Objective:  Vital Signs in the last 24 hours: Temp:  [97.6 F (36.4 C)-98.5 F (36.9 C)] 98.5 F (36.9 C) (09/22 0618) Pulse Rate:  [72-97] 97 (09/22 0618) Cardiac Rhythm: Sinus tachycardia (09/22 0738) Resp:  [18] 18 (09/21 1500) BP: (149-162)/(77-99) 162/99 (09/22 0618) SpO2:  [100 %] 100 % (09/22 0618) Weight:  [65.1 kg (143 lb 9.6 oz)] 65.1 kg (143 lb 9.6 oz) (09/22 0618)  Physical Exam: BP Readings from Last 1 Encounters:  04/01/16 (!) 162/99    Wt Readings from Last 1 Encounters:  04/01/16 65.1 kg (143 lb 9.6 oz)    Weight change: -1.497 kg (-3 lb 4.8 oz)  HEENT: [place]/AT, Eyes-Brown, PERL, EOMI, Conjunctiva-Pink, Sclera-Non-icteric Neck: No JVD, No bruit, Trachea midline. Lungs:  Clear, Bilateral. Cardiac:  Regular rhythm, normal S1 and S2, no S3. II/VI systolic murmur. Abdomen:  Soft, non-tender. Extremities:  2 + edema present. No cyanosis. No clubbing. 2" diameter large circular wound and few blisters and smaller wound are unchanged. CNS: AxOx3, Cranial nerves grossly intact, moves all 4 extremities. Right handed. Skin: Warm and dry.   Intake/Output from previous day: 09/21 0701 - 09/22 0700 In: 1201.5 [P.O.:600; I.V.:301.5; IV Piggyback:300] Out: 3400 [Urine:3400]    Lab Results: BMET    Component Value Date/Time   NA 136 03/29/2016 0357   NA 138 03/27/2016 0517   NA 138 03/26/2016 0458   K 4.2 03/29/2016 0357   K 3.7 03/27/2016 0517   K 3.4 (L) 03/26/2016 0458   CL 98 (L) 03/29/2016 0357   CL 99 (L) 03/27/2016 0517   CL 99 (L) 03/26/2016 0458   CO2 28 03/29/2016 0357   CO2 26 03/27/2016 0517   CO2 27 03/26/2016 0458   GLUCOSE 140 (H) 03/29/2016 0357   GLUCOSE 168 (H) 03/27/2016 0517   GLUCOSE 169 (H) 03/26/2016 0458   BUN 40 (H) 03/29/2016 0357   BUN 44 (H) 03/27/2016 0517   BUN 52 (H) 03/26/2016 0458   CREATININE 1.32 (H) 03/31/2016  0535   CREATININE 1.26 (H) 03/29/2016 0357   CREATININE 1.43 (H) 03/27/2016 0517   CALCIUM 9.2 03/29/2016 0357   CALCIUM 9.0 03/27/2016 0517   CALCIUM 9.3 03/26/2016 0458   GFRNONAA 42 (L) 03/31/2016 0535   GFRNONAA 44 (L) 03/29/2016 0357   GFRNONAA 38 (L) 03/27/2016 0517   GFRAA 48 (L) 03/31/2016 0535   GFRAA 51 (L) 03/29/2016 0357   GFRAA 44 (L) 03/27/2016 0517   CBC    Component Value Date/Time   WBC 4.8 03/25/2016 0519   RBC 5.54 (H) 03/25/2016 0519   HGB 14.4 03/25/2016 0519   HCT 44.4 03/25/2016 0519   PLT 162 03/25/2016 0519   MCV 80.1 03/25/2016 0519   MCH 26.0 03/25/2016 0519   MCHC 32.4 03/25/2016 0519   RDW 19.2 (H) 03/25/2016 0519   LYMPHSABS 1.6 03/24/2016 1553   MONOABS 0.4 03/24/2016 1553   EOSABS 0.0 03/24/2016 1553   BASOSABS 0.0 03/24/2016 1553   HEPATIC Function Panel  Recent Labs  05/25/15 1634 07/03/15 1618 03/24/16 1553  PROT 7.2 7.0 6.9   HEMOGLOBIN A1C No components found for: HGA1C,  MPG CARDIAC ENZYMES Lab Results  Component Value Date   CKTOTAL 59 09/07/2012   CKMB 3.2 09/07/2012   TROPONINI <0.03 07/04/2015   TROPONINI 0.03 07/03/2015   TROPONINI <0.03 07/03/2015   BNP No results for input(s):  PROBNP in the last 8760 hours. TSH No results for input(s): TSH in the last 8760 hours. CHOLESTEROL  Recent Labs  05/28/15 0654  CHOL 157    Scheduled Meds: . allopurinol  100 mg Oral BID  . aspirin  81 mg Oral Daily  . carvedilol  3.125 mg Oral BID WC  . cefTRIAXone (ROCEPHIN)  IV  1 g Intravenous Q24H  . cholecalciferol  1,000 Units Oral Q Mon  . collagenase   Topical Daily  . cycloSPORINE  1 drop Both Eyes Daily  . digoxin  0.0625 mg Oral Daily  . enoxaparin (LOVENOX) injection  40 mg Subcutaneous Q24H  . furosemide  40 mg Intravenous BID  . magnesium oxide  400 mg Oral Daily  . metolazone  5 mg Oral Daily  . potassium chloride  20 mEq Oral BID  . vancomycin  1,250 mg Intravenous Q24H   Continuous Infusions: . 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 Continue diuresis.   LOS: 8 days    [name]  MD  04/01/2016, 9:03 AM

## 2016-04-02 LAB — BASIC METABOLIC PANEL
Anion gap: 14 (ref 5–15)
BUN: 35 mg/dL — AB (ref 6–20)
CHLORIDE: 97 mmol/L — AB (ref 101–111)
CO2: 24 mmol/L (ref 22–32)
CREATININE: 1.29 mg/dL — AB (ref 0.44–1.00)
Calcium: 9.2 mg/dL (ref 8.9–10.3)
GFR calc Af Amer: 50 mL/min — ABNORMAL LOW (ref >60)
GFR calc non Af Amer: 43 mL/min — ABNORMAL LOW (ref >60)
Glucose, Bld: 111 mg/dL — ABNORMAL HIGH (ref 65–99)
Potassium: 4.7 mmol/L (ref 3.5–5.1)
SODIUM: 135 mmol/L (ref 135–145)

## 2016-04-02 LAB — MAGNESIUM: MAGNESIUM: 2.1 mg/dL (ref 1.7–2.4)

## 2016-04-02 MED ORDER — MAGNESIUM SULFATE 2 GM/50ML IV SOLN
2.0000 g | Freq: Once | INTRAVENOUS | Status: AC
  Administered 2016-04-02: 2 g via INTRAVENOUS
  Filled 2016-04-02: qty 50

## 2016-04-02 MED ORDER — FUROSEMIDE 40 MG PO TABS
40.0000 mg | ORAL_TABLET | Freq: Two times a day (BID) | ORAL | Status: DC
  Administered 2016-04-03 – 2016-04-06 (×7): 40 mg via ORAL
  Filled 2016-04-02 (×8): qty 1

## 2016-04-02 NOTE — Progress Notes (Signed)
Pharmacy Antibiotic Note  Tanya SpikeMargaret A Klute is a 65 y.o. female admitted on 03/24/2016 with wound infection.  Pharmacy has been consulted for vancomycin dosing. Today is day 10 of ceftriaxone and day 4 of vancomycin. Wound cx negative, afeb, WBC wnl, CrCl stable at ~40 ml/min  Plan: -Continue vancomycin 1250 mg IV Q24H -Monitor renal function, clinical progress, abx LOT -Check vanc trough tomorrow -Please consider d/c ceftriaxone  Height: 5\' 5"  (165.1 cm) Weight: 144 lb 6.4 oz (65.5 kg) IBW/kg (Calculated) : 57  Temp (24hrs), Avg:97.6 F (36.4 C), Min:97.6 F (36.4 C), Max:97.7 F (36.5 C)   Recent Labs Lab 03/27/16 0517 03/29/16 0357 03/31/16 0535 04/01/16 0842 04/02/16 0321  WBC  --   --   --  4.2  --   CREATININE 1.43* 1.26* 1.32* 1.28* 1.29*    Estimated Creatinine Clearance: 39.6 mL/min (by C-G formula based on SCr of 1.29 mg/dL (H)).    Allergies  Allergen Reactions  . Bidil [Isosorb Dinitrate-Hydralazine] Itching and Other (See Comments)    Jittery, feels like something is crawling    Antimicrobials this admission: 9/20 vanc>> 9/14 ceftriaxone>>  Dose adjustments this admission: n/a  Microbiology results: 9/14 wound cx: neg   Thank you for allowing pharmacy to be a part of this patient's care.  Mackie Paienee Ackley, PharmD PGY1 Pharmacy Resident Pager: [phone] 04/02/2016 11:19 AM

## 2016-04-02 NOTE — Progress Notes (Signed)
Late Entry Ref: [name], MD   Subjective:  Feeling little improved every day. Afebrile.  Objective:  Vital Signs in the last 24 hours: Temp:  [97.6 F (36.4 C)-98.5 F (36.9 C)] 98.5 F (36.9 C) (09/23 1431) Pulse Rate:  [62-80] 80 (09/23 1431) Cardiac Rhythm: Normal sinus rhythm (09/23 0745) Resp:  [16-20] 20 (09/23 1431) BP: (133-151)/(69-86) 148/86 (09/23 1431) SpO2:  [98 %-100 %] 98 % (09/23 1431) Weight:  [65.5 kg (144 lb 6.4 oz)] 65.5 kg (144 lb 6.4 oz) (09/23 0500)  Physical Exam: BP Readings from Last 1 Encounters:  04/02/16 (!) 148/86    Wt Readings from Last 1 Encounters:  04/02/16 65.5 kg (144 lb 6.4 oz)    Weight change: 0.363 kg (12.8 oz)  HEENT: [place]/AT, Eyes-Brown, PERL, EOMI, Conjunctiva-Pink, Sclera-Non-icteric Neck: No JVD, No bruit, Trachea midline. Lungs:  Clear, Bilateral. Cardiac:  Regular rhythm, normal S1 and S2, no S3.  Abdomen:  Soft, non-tender. Extremities:  2+  edema present. No cyanosis. No clubbing. Right leg wounds similar as before but drier. CNS: AxOx3, Cranial nerves grossly intact, moves all 4 extremities.  Skin: Warm and dry..   Intake/Output from previous day: 09/22 0701 - 09/23 0700 In: 1137.2 [P.O.:720; I.V.:117.2; IV Piggyback:300] Out: 2000 [Urine:2000]    Lab Results: BMET    Component Value Date/Time   NA 135 04/02/2016 0321   NA 133 (L) 04/01/2016 0842   NA 136 03/29/2016 0357   K 4.7 04/02/2016 0321   K 4.2 04/01/2016 0842   K 4.2 03/29/2016 0357   CL 97 (L) 04/02/2016 0321   CL 95 (L) 04/01/2016 0842   CL 98 (L) 03/29/2016 0357   CO2 24 04/02/2016 0321   CO2 30 04/01/2016 0842   CO2 28 03/29/2016 0357   GLUCOSE 111 (H) 04/02/2016 0321   GLUCOSE 208 (H) 04/01/2016 0842   GLUCOSE 140 (H) 03/29/2016 0357   BUN 35 (H) 04/02/2016 0321   BUN 32 (H) 04/01/2016 0842   BUN 40 (H) 03/29/2016 0357   CREATININE 1.29 (H) 04/02/2016 0321   CREATININE 1.28 (H) 04/01/2016 0842   CREATININE 1.32 (H) 03/31/2016 0535    CALCIUM 9.2 04/02/2016 0321   CALCIUM 9.2 04/01/2016 0842   CALCIUM 9.2 03/29/2016 0357   GFRNONAA 43 (L) 04/02/2016 0321   GFRNONAA 43 (L) 04/01/2016 0842   GFRNONAA 42 (L) 03/31/2016 0535   GFRAA 50 (L) 04/02/2016 0321   GFRAA 50 (L) 04/01/2016 0842   GFRAA 48 (L) 03/31/2016 0535   CBC    Component Value Date/Time   WBC 4.2 04/01/2016 0842   RBC 5.74 (H) 04/01/2016 0842   HGB 15.2 (H) 04/01/2016 0842   HCT 46.8 (H) 04/01/2016 0842   PLT 200 04/01/2016 0842   MCV 81.5 04/01/2016 0842   MCH 26.5 04/01/2016 0842   MCHC 32.5 04/01/2016 0842   RDW 17.6 (H) 04/01/2016 0842   LYMPHSABS 1.6 03/24/2016 1553   MONOABS 0.4 03/24/2016 1553   EOSABS 0.0 03/24/2016 1553   BASOSABS 0.0 03/24/2016 1553   HEPATIC Function Panel  Recent Labs  05/25/15 1634 07/03/15 1618 03/24/16 1553  PROT 7.2 7.0 6.9   HEMOGLOBIN A1C No components found for: HGA1C,  MPG CARDIAC ENZYMES Lab Results  Component Value Date   CKTOTAL 59 09/07/2012   CKMB 3.2 09/07/2012   TROPONINI <0.03 07/04/2015   TROPONINI 0.03 07/03/2015   TROPONINI <0.03 07/03/2015   BNP No results for input(s): PROBNP in the last 8760 hours. TSH No results  for input(s): TSH in the last 8760 hours. CHOLESTEROL  Recent Labs  05/28/15 0654  CHOL 157    Scheduled Meds: . allopurinol  100 mg Oral BID  . aspirin  81 mg Oral Daily  . bisacodyl  10 mg Rectal Once  . carvedilol  3.125 mg Oral BID WC  . cholecalciferol  1,000 Units Oral Q Mon  . collagenase   Topical Daily  . cycloSPORINE  1 drop Both Eyes Daily  . digoxin  0.0625 mg Oral Daily  . docusate sodium  100 mg Oral BID  . enoxaparin (LOVENOX) injection  40 mg Subcutaneous Q24H  . [START ON 04/03/2016] furosemide  40 mg Oral BID  . magnesium oxide  400 mg Oral Daily  . metolazone  5 mg Oral Daily  . potassium chloride  20 mEq Oral BID  . vancomycin  1,250 mg Intravenous Q24H   Continuous Infusions:  PRN Meds:.acetaminophen,  albuterol  Assessment/Plan: Right lower leg cellulitis with poorly healing wounds. 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   LOS: 8 days    Orpah CobbAjay Alliya Marcon  MD  04/02/2016, 3:38 PM

## 2016-04-02 NOTE — Progress Notes (Signed)
Ref: [name], MD   Subjective:  Little improvement per patient.  Objective:  Vital Signs in the last 24 hours: Temp:  [97.6 F (36.4 C)-98.5 F (36.9 C)] 98.5 F (36.9 C) (09/23 1431) Pulse Rate:  [64-80] 80 (09/23 1431) Cardiac Rhythm: Normal sinus rhythm (09/23 0745) Resp:  [20] 20 (09/23 1431) BP: (145-151)/(70-86) 148/86 (09/23 1431) SpO2:  [98 %-100 %] 98 % (09/23 1431) Weight:  [65.5 kg (144 lb 6.4 oz)] 65.5 kg (144 lb 6.4 oz) (09/23 0500)  Physical Exam: BP Readings from Last 1 Encounters:  04/02/16 (!) 148/86    Wt Readings from Last 1 Encounters:  04/02/16 65.5 kg (144 lb 6.4 oz)    Weight change: 0.363 kg (12.8 oz)  HEENT: [place]/AT, Eyes-Brown, PERL, EOMI, Conjunctiva-Pink, Sclera-Non-icteric Neck: No JVD, No bruit, Trachea midline. Lungs:  Clear, Bilateral. Cardiac:  Regular rhythm, normal S1 and S2, no S3. II/VI systolic murmur. Abdomen:  Soft, non-tender. Extremities:  1+ edema present. No cyanosis. No clubbing. CNS: AxOx3, Cranial nerves grossly intact, moves all 4 extremities. Right handed. Skin: Warm and dry.   Intake/Output from previous day: 09/22 0701 - 09/23 0700 In: 1137.2 [P.O.:720; I.V.:117.2; IV Piggyback:300] Out: 2000 [Urine:2000]    Lab Results: BMET    Component Value Date/Time   NA 135 04/02/2016 0321   NA 133 (L) 04/01/2016 0842   NA 136 03/29/2016 0357   K 4.7 04/02/2016 0321   K 4.2 04/01/2016 0842   K 4.2 03/29/2016 0357   CL 97 (L) 04/02/2016 0321   CL 95 (L) 04/01/2016 0842   CL 98 (L) 03/29/2016 0357   CO2 24 04/02/2016 0321   CO2 30 04/01/2016 0842   CO2 28 03/29/2016 0357   GLUCOSE 111 (H) 04/02/2016 0321   GLUCOSE 208 (H) 04/01/2016 0842   GLUCOSE 140 (H) 03/29/2016 0357   BUN 35 (H) 04/02/2016 0321   BUN 32 (H) 04/01/2016 0842   BUN 40 (H) 03/29/2016 0357   CREATININE 1.29 (H) 04/02/2016 0321   CREATININE 1.28 (H) 04/01/2016 0842   CREATININE 1.32 (H) 03/31/2016 0535   CALCIUM 9.2 04/02/2016 0321   CALCIUM  9.2 04/01/2016 0842   CALCIUM 9.2 03/29/2016 0357   GFRNONAA 43 (L) 04/02/2016 0321   GFRNONAA 43 (L) 04/01/2016 0842   GFRNONAA 42 (L) 03/31/2016 0535   GFRAA 50 (L) 04/02/2016 0321   GFRAA 50 (L) 04/01/2016 0842   GFRAA 48 (L) 03/31/2016 0535   CBC    Component Value Date/Time   WBC 4.2 04/01/2016 0842   RBC 5.74 (H) 04/01/2016 0842   HGB 15.2 (H) 04/01/2016 0842   HCT 46.8 (H) 04/01/2016 0842   PLT 200 04/01/2016 0842   MCV 81.5 04/01/2016 0842   MCH 26.5 04/01/2016 0842   MCHC 32.5 04/01/2016 0842   RDW 17.6 (H) 04/01/2016 0842   LYMPHSABS 1.6 03/24/2016 1553   MONOABS 0.4 03/24/2016 1553   EOSABS 0.0 03/24/2016 1553   BASOSABS 0.0 03/24/2016 1553   HEPATIC Function Panel  Recent Labs  05/25/15 1634 07/03/15 1618 03/24/16 1553  PROT 7.2 7.0 6.9   HEMOGLOBIN A1C No components found for: HGA1C,  MPG CARDIAC ENZYMES Lab Results  Component Value Date   CKTOTAL 59 09/07/2012   CKMB 3.2 09/07/2012   TROPONINI <0.03 07/04/2015   TROPONINI 0.03 07/03/2015   TROPONINI <0.03 07/03/2015   BNP No results for input(s): PROBNP in the last 8760 hours. TSH No results for input(s): TSH in the last 8760 hours. CHOLESTEROL  Recent Labs  05/28/15 0654  CHOL 157    Scheduled Meds: . allopurinol  100 mg Oral BID  . aspirin  81 mg Oral Daily  . bisacodyl  10 mg Rectal Once  . carvedilol  3.125 mg Oral BID WC  . cholecalciferol  1,000 Units Oral Q Mon  . collagenase   Topical Daily  . cycloSPORINE  1 drop Both Eyes Daily  . digoxin  0.0625 mg Oral Daily  . docusate sodium  100 mg Oral BID  . enoxaparin (LOVENOX) injection  40 mg Subcutaneous Q24H  . [START ON 04/03/2016] furosemide  40 mg Oral BID  . magnesium oxide  400 mg Oral Daily  . metolazone  5 mg Oral Daily  . potassium chloride  20 mEq Oral BID  . 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 PO lasix. Increase activity.    LOS: 9 days    Orpah CobbAjay Cherrie Franca  MD  04/02/2016, 3:44 PM

## 2016-04-03 LAB — BASIC METABOLIC PANEL
Anion gap: 10 (ref 5–15)
BUN: 36 mg/dL — AB (ref 6–20)
CHLORIDE: 99 mmol/L — AB (ref 101–111)
CO2: 24 mmol/L (ref 22–32)
Calcium: 9.1 mg/dL (ref 8.9–10.3)
Creatinine, Ser: 1.43 mg/dL — ABNORMAL HIGH (ref 0.44–1.00)
GFR calc Af Amer: 44 mL/min — ABNORMAL LOW (ref >60)
GFR calc non Af Amer: 38 mL/min — ABNORMAL LOW (ref >60)
GLUCOSE: 123 mg/dL — AB (ref 65–99)
POTASSIUM: 5.1 mmol/L (ref 3.5–5.1)
Sodium: 133 mmol/L — ABNORMAL LOW (ref 135–145)

## 2016-04-03 LAB — VANCOMYCIN, TROUGH: Vancomycin Tr: 25 ug/mL (ref 15–20)

## 2016-04-03 LAB — MAGNESIUM: Magnesium: 2.2 mg/dL (ref 1.7–2.4)

## 2016-04-03 MED ORDER — VANCOMYCIN HCL IN DEXTROSE 750-5 MG/150ML-% IV SOLN
750.0000 mg | INTRAVENOUS | Status: DC
  Filled 2016-04-03: qty 150

## 2016-04-03 MED ORDER — VANCOMYCIN HCL IN DEXTROSE 750-5 MG/150ML-% IV SOLN: 750.0000 mg | INTRAVENOUS | Status: DC

## 2016-04-03 NOTE — Progress Notes (Signed)
Ref: Barret Esquivel S, MD   Subjective:  Right leg wounds are getting smaller.   Objective:  Vital Signs in the last 24 hours: Temp:  [98.4 F (36.9 C)-98.7 F (37.1 C)] 98.7 F (37.1 C) (09/24 1257) Pulse Rate:  [63-87] 63 (09/24 1257) Cardiac Rhythm: Normal sinus rhythm (09/24 0822) Resp:  [14-18] 18 (09/24 1257) BP: (122-158)/(56-87) 122/56 (09/24 1257) SpO2:  [100 %] 100 % (09/24 1257) Weight:  [65.5 kg (144 lb 8 oz)] 65.5 kg (144 lb 8 oz) (09/24 0603)  Physical Exam: BP Readings from Last 1 Encounters:  04/03/16 (!) 122/56    Wt Readings from Last 1 Encounters:  04/03/16 65.5 kg (144 lb 8 oz)    Weight change: 0.045 kg (1.6 oz)  HEENT: [place]/AT, Eyes-Brown, PERL, EOMI, Conjunctiva-Pink, Sclera-Non-icteric Neck: No JVD, No bruit, Trachea midline. Lungs:  Clear, Bilateral. Cardiac:  Regular rhythm, normal S1 and S2, no S3. II/VI systolic murmur. Abdomen:  Soft, non-tender. Extremities:  1+ edema present. No cyanosis. No clubbing. CNS: AxOx3, Cranial nerves grossly intact, moves all 4 extremities. Right handed. Skin: Warm and dry.   Intake/Output from previous day: 09/23 0701 - 09/24 0700 In: 829.8 [P.O.:540; I.V.:39.8; IV Piggyback:250] Out: 900 [Urine:900]    Lab Results: BMET    Component Value Date/Time   NA 133 (L) 04/03/2016 0432   NA 135 04/02/2016 0321   NA 133 (L) 04/01/2016 0842   K 5.1 04/03/2016 0432   K 4.7 04/02/2016 0321   K 4.2 04/01/2016 0842   CL 99 (L) 04/03/2016 0432   CL 97 (L) 04/02/2016 0321   CL 95 (L) 04/01/2016 0842   CO2 24 04/03/2016 0432   CO2 24 04/02/2016 0321   CO2 30 04/01/2016 0842   GLUCOSE 123 (H) 04/03/2016 0432   GLUCOSE 111 (H) 04/02/2016 0321   GLUCOSE 208 (H) 04/01/2016 0842   BUN 36 (H) 04/03/2016 0432   BUN 35 (H) 04/02/2016 0321   BUN 32 (H) 04/01/2016 0842   CREATININE 1.43 (H) 04/03/2016 0432   CREATININE 1.29 (H) 04/02/2016 0321   CREATININE 1.28 (H) 04/01/2016 0842   CALCIUM 9.1 04/03/2016 0432   CALCIUM  9.2 04/02/2016 0321   CALCIUM 9.2 04/01/2016 0842   GFRNONAA 38 (L) 04/03/2016 0432   GFRNONAA 43 (L) 04/02/2016 0321   GFRNONAA 43 (L) 04/01/2016 0842   GFRAA 44 (L) 04/03/2016 0432   GFRAA 50 (L) 04/02/2016 0321   GFRAA 50 (L) 04/01/2016 0842   CBC    Component Value Date/Time   WBC 4.2 04/01/2016 0842   RBC 5.74 (H) 04/01/2016 0842   HGB 15.2 (H) 04/01/2016 0842   HCT 46.8 (H) 04/01/2016 0842   PLT 200 04/01/2016 0842   MCV 81.5 04/01/2016 0842   MCH 26.5 04/01/2016 0842   MCHC 32.5 04/01/2016 0842   RDW 17.6 (H) 04/01/2016 0842   LYMPHSABS 1.6 03/24/2016 1553   MONOABS 0.4 03/24/2016 1553   EOSABS 0.0 03/24/2016 1553   BASOSABS 0.0 03/24/2016 1553   HEPATIC Function Panel  Recent Labs  05/25/15 1634 07/03/15 1618 03/24/16 1553  PROT 7.2 7.0 6.9   HEMOGLOBIN A1C No components found for: HGA1C,  MPG CARDIAC ENZYMES Lab Results  Component Value Date   CKTOTAL 59 09/07/2012   CKMB 3.2 09/07/2012   TROPONINI <0.03 07/04/2015   TROPONINI 0.03 07/03/2015   TROPONINI <0.03 07/03/2015   BNP No results for input(s): PROBNP in the last 8760 hours. TSH No results for input(s): TSH in the last 8760  hours. CHOLESTEROL  Recent Labs  05/28/15 0654  CHOL 157    Scheduled Meds: . allopurinol  100 mg Oral BID  . aspirin  81 mg Oral Daily  . bisacodyl  10 mg Rectal Once  . carvedilol  3.125 mg Oral BID WC  . cholecalciferol  1,000 Units Oral Q Mon  . collagenase   Topical Daily  . cycloSPORINE  1 drop Both Eyes Daily  . digoxin  0.0625 mg Oral Daily  . docusate sodium  100 mg Oral BID  . enoxaparin (LOVENOX) injection  40 mg Subcutaneous Q24H  . furosemide  40 mg Oral BID  . magnesium oxide  400 mg Oral Daily  . metolazone  5 mg Oral Daily  . [START ON 04/04/2016] vancomycin  750 mg Intravenous Q24H   Continuous Infusions:  PRN Meds:.acetaminophen, albuterol  Assessment/Plan: Right lower leg cellulitis with poorly healing wound CAD Acute on chronic  biventricular failure. DM, II CKD, III Hypertension  DC ceftriaxone Increase activity.    LOS: 10 days    Orpah CobbAjay Wendie Diskin  MD  04/03/2016, 2:32 PM

## 2016-04-03 NOTE — Progress Notes (Signed)
Pharmacy Antibiotic Note  Tanya SpikeMargaret A [name] is a 65 y.o. female admitted on 03/24/2016 with cellulitis.  Pharmacy has been consulted for vancomycin dosing. Today is day 5 of vancomycin, ceftriaxone was discontinued yesterday. Patient remains afebrile, wbc wnl, and wound culture negative. Today's vanc trough came back supratherapeutic at 25 with a goal of 10-15. Given her increase in serum creatinine and decrease in renal function, we will hold and decrease her dose.   Plan: Hold vancomycin dose today, then restart tomorrow at 750 mg IV every 24 hours Check random vanc level at 1000 tomorrow Goal trough 10-15 mcg/mL.  Height: 5\' 5"  (165.1 cm) Weight: 144 lb 8 oz (65.5 kg) IBW/kg (Calculated) : 57  Temp (24hrs), Avg:98.5 F (36.9 C), Min:98.4 F (36.9 C), Max:98.5 F (36.9 C)   Recent Labs Lab 03/29/16 0357 03/31/16 0535 04/01/16 0842 04/02/16 0321 04/03/16 0432 04/03/16 0938  WBC  --   --  4.2  --   --   --   CREATININE 1.26* 1.32* 1.28* 1.29* 1.43*  --   VANCOTROUGH  --   --   --   --   --  25*    Estimated Creatinine Clearance: 35.8 mL/min (by C-G formula based on SCr of 1.43 mg/dL (H)).    Allergies  Allergen Reactions  . Bidil [Isosorb Dinitrate-Hydralazine] Itching and Other (See Comments)    Jittery, feels like something is crawling    Antimicrobials this admission:  9/20 vanc>> 9/14 rocephin>> 9/23  Dose adjustments this admission:  9/24 VT 25 >> decreased dose from 1250mg  q24 to 750mg  q24h  Microbiology results:  9/14 wound: neg  Thank you for allowing pharmacy to be a part of this patient's care.  Allie BossierApryl Anderson, PharmD PGY1 Pharmacy Resident [phone] (Pager) 04/03/2016 11:23 AM

## 2016-04-04 LAB — BASIC METABOLIC PANEL
Anion gap: 9 (ref 5–15)
BUN: 43 mg/dL — ABNORMAL HIGH (ref 6–20)
CHLORIDE: 102 mmol/L (ref 101–111)
CO2: 22 mmol/L (ref 22–32)
Calcium: 8.8 mg/dL — ABNORMAL LOW (ref 8.9–10.3)
Creatinine, Ser: 1.74 mg/dL — ABNORMAL HIGH (ref 0.44–1.00)
GFR calc non Af Amer: 30 mL/min — ABNORMAL LOW (ref >60)
GFR, EST AFRICAN AMERICAN: 35 mL/min — AB (ref >60)
Glucose, Bld: 118 mg/dL — ABNORMAL HIGH (ref 65–99)
POTASSIUM: 4.7 mmol/L (ref 3.5–5.1)
SODIUM: 133 mmol/L — AB (ref 135–145)

## 2016-04-04 LAB — VANCOMYCIN, RANDOM: VANCOMYCIN RM: 19

## 2016-04-04 LAB — GLUCOSE, CAPILLARY: GLUCOSE-CAPILLARY: 89 mg/dL (ref 65–99)

## 2016-04-04 MED ORDER — VANCOMYCIN HCL IN DEXTROSE 750-5 MG/150ML-% IV SOLN
750.0000 mg | INTRAVENOUS | Status: DC
  Administered 2016-04-04 – 2016-04-06 (×2): 750 mg via INTRAVENOUS
  Filled 2016-04-04 (×3): qty 150

## 2016-04-04 NOTE — Progress Notes (Signed)
CCMD reported pt had a 10 beat run of VTACH. Pt was at rest, up in chair. Pt is currently asymptomatic. Pt states that he heart felt like it fluttered then stopped. On call cardiologist paged.

## 2016-04-04 NOTE — Progress Notes (Signed)
Ref: Shewanda Sharpe S, MD   Subjective:  Mild worsening of renal function off dopamine drip. Afebrile. Minimal drainage from right leg wound.  Objective:  Vital Signs in the last 24 hours: Temp:  [97.5 F (36.4 C)-98.6 F (37 C)] 97.8 F (36.6 C) (09/25 1646) Pulse Rate:  [70-73] 72 (09/25 1745) Cardiac Rhythm: Normal sinus rhythm (09/25 2001) Resp:  [15-17] 16 (09/25 1646) BP: (138-146)/(71-76) 146/75 (09/25 1745) SpO2:  [97 %-100 %] 100 % (09/25 1646) Weight:  [66.5 kg (146 lb 9.6 oz)] 66.5 kg (146 lb 9.6 oz) (09/25 0542)  Physical Exam: BP Readings from Last 1 Encounters:  04/04/16 (!) 146/75    Wt Readings from Last 1 Encounters:  04/04/16 66.5 kg (146 lb 9.6 oz)    Weight change: 0.953 kg (2 lb 1.6 oz)  HEENT: [place]/AT, Eyes-Brown, PERL, EOMI, Conjunctiva-Pink, Sclera-Non-icteric Neck: No JVD, No bruit, Trachea midline. Lungs:  Clear, Bilateral. Cardiac:  Regular rhythm, normal S1 and S2, no S3. II/VI systolic murmur. Abdomen:  Soft, non-tender. Extremities:  1+ edema present. No cyanosis. No clubbing. Little less than dime size and half dollar size right lower leg wounds have good granulation tissue.  CNS: AxOx3, Cranial nerves grossly intact, moves all 4 extremities. Right handed. Skin: Warm and dry.   Intake/Output from previous day: 09/24 0701 - 09/25 0700 In: 570 [P.O.:570] Out: 200 [Urine:200]    Lab Results: BMET    Component Value Date/Time   NA 133 (L) 04/04/2016 0518   NA 133 (L) 04/03/2016 0432   NA 135 04/02/2016 0321   K 4.7 04/04/2016 0518   K 5.1 04/03/2016 0432   K 4.7 04/02/2016 0321   CL 102 04/04/2016 0518   CL 99 (L) 04/03/2016 0432   CL 97 (L) 04/02/2016 0321   CO2 22 04/04/2016 0518   CO2 24 04/03/2016 0432   CO2 24 04/02/2016 0321   GLUCOSE 118 (H) 04/04/2016 0518   GLUCOSE 123 (H) 04/03/2016 0432   GLUCOSE 111 (H) 04/02/2016 0321   BUN 43 (H) 04/04/2016 0518   BUN 36 (H) 04/03/2016 0432   BUN 35 (H) 04/02/2016 0321   CREATININE  1.74 (H) 04/04/2016 0518   CREATININE 1.43 (H) 04/03/2016 0432   CREATININE 1.29 (H) 04/02/2016 0321   CALCIUM 8.8 (L) 04/04/2016 0518   CALCIUM 9.1 04/03/2016 0432   CALCIUM 9.2 04/02/2016 0321   GFRNONAA 30 (L) 04/04/2016 0518   GFRNONAA 38 (L) 04/03/2016 0432   GFRNONAA 43 (L) 04/02/2016 0321   GFRAA 35 (L) 04/04/2016 0518   GFRAA 44 (L) 04/03/2016 0432   GFRAA 50 (L) 04/02/2016 0321   CBC    Component Value Date/Time   WBC 4.2 04/01/2016 0842   RBC 5.74 (H) 04/01/2016 0842   HGB 15.2 (H) 04/01/2016 0842   HCT 46.8 (H) 04/01/2016 0842   PLT 200 04/01/2016 0842   MCV 81.5 04/01/2016 0842   MCH 26.5 04/01/2016 0842   MCHC 32.5 04/01/2016 0842   RDW 17.6 (H) 04/01/2016 0842   LYMPHSABS 1.6 03/24/2016 1553   MONOABS 0.4 03/24/2016 1553   EOSABS 0.0 03/24/2016 1553   BASOSABS 0.0 03/24/2016 1553   HEPATIC Function Panel  Recent Labs  05/25/15 1634 07/03/15 1618 03/24/16 1553  PROT 7.2 7.0 6.9   HEMOGLOBIN A1C No components found for: HGA1C,  MPG CARDIAC ENZYMES Lab Results  Component Value Date   CKTOTAL 59 09/07/2012   CKMB 3.2 09/07/2012   TROPONINI <0.03 07/04/2015   TROPONINI 0.03 07/03/2015  TROPONINI <0.03 07/03/2015   BNP No results for input(s): PROBNP in the last 8760 hours. TSH No results for input(s): TSH in the last 8760 hours. CHOLESTEROL  Recent Labs  05/28/15 0654  CHOL 157    Scheduled Meds: . allopurinol  100 mg Oral BID  . aspirin  81 mg Oral Daily  . bisacodyl  10 mg Rectal Once  . carvedilol  3.125 mg Oral BID WC  . cholecalciferol  1,000 Units Oral Q Mon  . collagenase   Topical Daily  . cycloSPORINE  1 drop Both Eyes Daily  . digoxin  0.0625 mg Oral Daily  . docusate sodium  100 mg Oral BID  . enoxaparin (LOVENOX) injection  40 mg Subcutaneous Q24H  . furosemide  40 mg Oral BID  . magnesium oxide  400 mg Oral Daily  . metolazone  5 mg Oral Daily  . vancomycin  750 mg Intravenous Q48H   Continuous Infusions:  PRN  Meds:.acetaminophen, albuterol  Assessment/Plan: Right lower leg cellulitis with slowly healing wounds. 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 DM, II. CKD, III. Hypertension.   Increase activity as tolerated. Patient advised to elevate leg when not walking.   LOS: 11 days    Orpah CobbAjay Quamir Willemsen  MD  04/04/2016, 8:53 PM

## 2016-04-04 NOTE — Progress Notes (Addendum)
Pharmacy Antibiotic Note  Tanya SpikeMargaret A Sarr is a [age] y.o. female  with wound infection.  Pharmacy has been consulted for vancomycin dosing. SCr= 1.74 with trend up -vancomycin level = 19 (at ~ 9am) and noted 25 on 9/24. Last dose was 1250mg  on 9/23 at ~ 10am.  The vancomycin level is ~ 48hrs post dose  Plan: -Change vancomycin to 750mg  IV Q48hr -BMET in am -Monitor renal function, clinical progress, abx LOT  Height: 5\' 5"  (165.1 cm) Weight: 146 lb 9.6 oz (66.5 kg) IBW/kg (Calculated) : 57  Temp (24hrs), Avg:98.3 F (36.8 C), Min:97.5 F (36.4 C), Max:98.7 F (37.1 C)   Recent Labs Lab 03/31/16 0535 04/01/16 0842 04/02/16 0321 04/03/16 0432 04/03/16 0938 04/04/16 0518 04/04/16 0851  WBC  --  4.2  --   --   --   --   --   CREATININE 1.32* 1.28* 1.29* 1.43*  --  1.74*  --   VANCOTROUGH  --   --   --   --  25*  --   --   VANCORANDOM  --   --   --   --   --   --  19    Estimated Creatinine Clearance: 29.4 mL/min (by C-G formula based on SCr of 1.74 mg/dL (H)).    Allergies  Allergen Reactions  . Bidil [Isosorb Dinitrate-Hydralazine] Itching and Other (See Comments)    Jittery, feels like something is crawling    Antimicrobials this admission: 9/20 vanc>> 9/14 ceftriaxone>> 9/23  Dose adjustments this admission: 9/24 VT 25 >> hold and decrease dose from 1250mg  q24 to 750mg  q24h  9/25 VR= 19 at ~ 9am. Change to 750mg  IV q48hr  Microbiology results: 9/14 wound cx: neg  Harland GermanAndrew Selyna Klahn, Pharm D 04/04/2016 10:57 AM

## 2016-04-05 LAB — BASIC METABOLIC PANEL
ANION GAP: 12 (ref 5–15)
BUN: 44 mg/dL — AB (ref 6–20)
CO2: 23 mmol/L (ref 22–32)
Calcium: 9.2 mg/dL (ref 8.9–10.3)
Chloride: 99 mmol/L — ABNORMAL LOW (ref 101–111)
Creatinine, Ser: 1.53 mg/dL — ABNORMAL HIGH (ref 0.44–1.00)
GFR, EST AFRICAN AMERICAN: 40 mL/min — AB (ref >60)
GFR, EST NON AFRICAN AMERICAN: 35 mL/min — AB (ref >60)
Glucose, Bld: 120 mg/dL — ABNORMAL HIGH (ref 65–99)
POTASSIUM: 4.2 mmol/L (ref 3.5–5.1)
SODIUM: 134 mmol/L — AB (ref 135–145)

## 2016-04-05 NOTE — Progress Notes (Signed)
Ref: Tanya Rodriguez, MD   Subjective:  Feeling little better.Afebrile.  Objective:  Vital Signs in the last 24 hours: Temp:  [97.8 F (36.6 C)-98.4 F (36.9 C)] 98.4 F (36.9 C) (09/26 0430) Pulse Rate:  [70-96] 96 (09/26 0757) Cardiac Rhythm: Normal sinus rhythm (09/26 0757) Resp:  [16-17] 17 (09/26 0430) BP: (140-153)/(66-89) 145/77 (09/26 0757) SpO2:  [99 %-100 %] 100 % (09/26 0430) Weight:  [66 kg (145 lb 9.6 oz)] 66 kg (145 lb 9.6 oz) (09/26 0430)  Physical Exam: BP Readings from Last 1 Encounters:  04/05/16 (!) 145/77    Wt Readings from Last 1 Encounters:  04/05/16 66 kg (145 lb 9.6 oz)    Weight change: -0.454 kg (-1 lb)  HEENT: [place]/AT, Eyes-Brown, PERL, EOMI, Conjunctiva-Pink, Sclera-Non-icteric Neck: No JVD, No bruit, Trachea midline. Lungs:  Clear, Bilateral. Cardiac:  Regular rhythm, normal S1 and S2, no S3. II/VI systolic murmur. Abdomen:  Soft, non-tender. Extremities:  1 + edema present. No cyanosis. No clubbing. Right lower leg wounds with decreasing drainage. CNS: AxOx3, Cranial nerves grossly intact, moves all 4 extremities. Right handed. Skin: Warm and dry.   Intake/Output from previous day: 09/25 0701 - 09/26 0700 In: 600 [P.O.:600] Out: 1000 [Urine:1000]    Lab Results: BMET    Component Value Date/Time   NA 134 (L) 04/05/2016 0449   NA 133 (L) 04/04/2016 0518   NA 133 (L) 04/03/2016 0432   K 4.2 04/05/2016 0449   K 4.7 04/04/2016 0518   K 5.1 04/03/2016 0432   CL 99 (L) 04/05/2016 0449   CL 102 04/04/2016 0518   CL 99 (L) 04/03/2016 0432   CO2 23 04/05/2016 0449   CO2 22 04/04/2016 0518   CO2 24 04/03/2016 0432   GLUCOSE 120 (H) 04/05/2016 0449   GLUCOSE 118 (H) 04/04/2016 0518   GLUCOSE 123 (H) 04/03/2016 0432   BUN 44 (H) 04/05/2016 0449   BUN 43 (H) 04/04/2016 0518   BUN 36 (H) 04/03/2016 0432   CREATININE 1.53 (H) 04/05/2016 0449   CREATININE 1.74 (H) 04/04/2016 0518   CREATININE 1.43 (H) 04/03/2016 0432   CALCIUM 9.2  04/05/2016 0449   CALCIUM 8.8 (L) 04/04/2016 0518   CALCIUM 9.1 04/03/2016 0432   GFRNONAA 35 (L) 04/05/2016 0449   GFRNONAA 30 (L) 04/04/2016 0518   GFRNONAA 38 (L) 04/03/2016 0432   GFRAA 40 (L) 04/05/2016 0449   GFRAA 35 (L) 04/04/2016 0518   GFRAA 44 (L) 04/03/2016 0432   CBC    Component Value Date/Time   WBC 4.2 04/01/2016 0842   RBC 5.74 (H) 04/01/2016 0842   HGB 15.2 (H) 04/01/2016 0842   HCT 46.8 (H) 04/01/2016 0842   PLT 200 04/01/2016 0842   MCV 81.5 04/01/2016 0842   MCH 26.5 04/01/2016 0842   MCHC 32.5 04/01/2016 0842   RDW 17.6 (H) 04/01/2016 0842   LYMPHSABS 1.6 03/24/2016 1553   MONOABS 0.4 03/24/2016 1553   EOSABS 0.0 03/24/2016 1553   BASOSABS 0.0 03/24/2016 1553   HEPATIC Function Panel  Recent Labs  05/25/15 1634 07/03/15 1618 03/24/16 1553  PROT 7.2 7.0 6.9   HEMOGLOBIN A1C No components found for: HGA1C,  MPG CARDIAC ENZYMES Lab Results  Component Value Date   CKTOTAL 59 09/07/2012   CKMB 3.2 09/07/2012   TROPONINI <0.03 07/04/2015   TROPONINI 0.03 07/03/2015   TROPONINI <0.03 07/03/2015   BNP No results for input(s): PROBNP in the last 8760 hours. TSH No results for input(s): TSH in  the last 8760 hours. CHOLESTEROL  Recent Labs  05/28/15 0654  CHOL 157    Scheduled Meds: . allopurinol  100 mg Oral BID  . aspirin  81 mg Oral Daily  . bisacodyl  10 mg Rectal Once  . carvedilol  3.125 mg Oral BID WC  . cholecalciferol  1,000 Units Oral Q Mon  . collagenase   Topical Daily  . cycloSPORINE  1 drop Both Eyes Daily  . digoxin  0.0625 mg Oral Daily  . docusate sodium  100 mg Oral BID  . enoxaparin (LOVENOX) injection  40 mg Subcutaneous Q24H  . furosemide  40 mg Oral BID  . magnesium oxide  400 mg Oral Daily  . metolazone  5 mg Oral Daily  . vancomycin  750 mg Intravenous Q48H   Continuous Infusions:  PRN Meds:.acetaminophen, albuterol  Assessment/Plan: Right lower leg cellulitis with slowly healing wounds. CAD Acute on  chronic biventricular failure. DM, II CKD, III Hypertension  Continue IV antibiotics.   LOS: 12 days    Orpah CobbAjay Lova Urbieta  MD  04/05/2016, 9:32 AM

## 2016-04-06 LAB — BASIC METABOLIC PANEL
Anion gap: 12 (ref 5–15)
BUN: 44 mg/dL — AB (ref 6–20)
CALCIUM: 9.2 mg/dL (ref 8.9–10.3)
CO2: 25 mmol/L (ref 22–32)
Chloride: 98 mmol/L — ABNORMAL LOW (ref 101–111)
Creatinine, Ser: 1.56 mg/dL — ABNORMAL HIGH (ref 0.44–1.00)
GFR calc Af Amer: 39 mL/min — ABNORMAL LOW (ref >60)
GFR, EST NON AFRICAN AMERICAN: 34 mL/min — AB (ref >60)
GLUCOSE: 93 mg/dL (ref 65–99)
Potassium: 3.9 mmol/L (ref 3.5–5.1)
Sodium: 135 mmol/L (ref 135–145)

## 2016-04-06 LAB — VANCOMYCIN, TROUGH: Vancomycin Tr: 14 ug/mL — ABNORMAL LOW (ref 15–20)

## 2016-04-06 MED ORDER — METOLAZONE 5 MG PO TABS: 5.0000 mg | ORAL_TABLET | ORAL | 3 refills | Status: DC

## 2016-04-06 MED ORDER — CARVEDILOL 3.125 MG PO TABS
3.1250 mg | ORAL_TABLET | Freq: Two times a day (BID) | ORAL | 3 refills | Status: DC
Start: 2016-04-06 — End: 2016-10-01

## 2016-04-06 MED ORDER — FUROSEMIDE 80 MG PO TABS: 40.0000 mg | ORAL_TABLET | Freq: Two times a day (BID) | ORAL | Status: DC

## 2016-04-06 MED ORDER — DOCUSATE SODIUM 100 MG PO CAPS: 100.0000 mg | ORAL_CAPSULE | Freq: Every day | ORAL | 3 refills | Status: DC

## 2016-04-06 NOTE — Discharge Summary (Signed)
Physician Discharge Summary  Patient ID: [name] MRN: [id number] DOB/AGE: 10-22-1950 65 y.o.  Admit date: 03/24/2016 Discharge date: 04/06/2016  Admission Diagnoses: Cellulitis of right lower leg with multiple open wounds. Biventricular heart failure, NYHA class 2 Diabetes Mellitus, II Essential hypertension CAD CKD, III  Discharge Diagnoses:  Principal Problem:   Cellulitis of right leg Active Problems:   Acute on chronic Biventricular heart failure, NYHA class 2 (HCC)   Diabetes mellitus, II (HCC)   Essential hypertension    CAD    CKD, III    Gout    GERD    Hypoalbuminemia    Mild protein calorie malnutrition  Discharged Condition: fair  Hospital Course: 65 years old female with known history of hypertension, gout, type II diabetes mellitus, biventricular systolic failure had 2 + leg edema extending up to mid-thigh has multiple open and weeping wounds on right lower legs. She had marginal response to IV Ceftriaxone hence Vacomycin was added. Diuresis with IV lasix was augmented with zaroxolyn and IV dopamine drip use. She lost close to 15 pounds of weight and right leg wounds were decreasing in size with good granulation tissue. She will have home health RN for dressing change/wound care and medications compliance. She will see me in 1 week.  Consults: cardiology  Significant Diagnostic Studies: labs:  BNP was 2,477.7 on admission and 1,578.5 after 3 days. CBC with diff was unremarkable. CMET showed elevated glucose, BUN/CR-59/1.95 and low albumin level with mildly elevated bilirubin. Magnesium was 1.8. Digoxin level was 0.3 ng.  EKG: Sinus rhythm, Left atrial enlargement and left axis deviation.  Chest x-ray: stable cardiomegaly and calcified aortic atherosclerosis.  X-ray right Tib-fib : No acute osseous abnormality.  Treatments: antibiotics: vancomycin and ceftriaxone and cardiac meds: carvedilol, amlodipine, digoxin, spironolactone, furosemide and  potassium  Discharge Exam: Blood pressure (!) 152/79, pulse 70, temperature 98.4 F (36.9 C), temperature source Oral, resp. rate 17, height 5\' 5"  (1.651 m), weight 65.9 kg (145 lb 3.2 oz), SpO2 100 %. General appearance: alert, cooperative, appears stated age and no distress Head: Normocephalic, atraumatic. Eyes: Brown eyes, conjunctivae normal, corneas clear. PERRL, EOM's intact.  Neck: no adenopathy, no carotid bruit, no JVD, supple, symmetrical, trachea midline and thyroid not enlarged. Resp: clear to auscultation bilaterally Cardio: regular rate and rhythm, S1, S2 normal, II/VI systolic murmur. No click, rub or gallop. GI: soft, non-tender; bowel sounds normal; no masses,  no organomegaly Extremities: Trace lower leg edema. Right lower leg 2 '' and few 1/2 '' ulcers with healthy granulation tissue. Skin: Warm and dry. No rashes. Neurologic: Alert and oriented X 3, normal strength and tone. Normal coordination and slow gait  Disposition: 01-Home or Self Care     Medication List    STOP taking these medications   colchicine 0.6 MG tablet   isosorbide-hydrALAZINE 20-37.5 MG tablet Commonly known as:  BIDIL     TAKE these medications   acetaminophen 500 MG tablet Commonly known as:  TYLENOL Take 500 mg by mouth every 6 (six) hours as needed for moderate pain or headache.   albuterol 108 (90 Base) MCG/ACT inhaler Commonly known as:  PROVENTIL HFA;VENTOLIN HFA Inhale 2 puffs into the lungs every 6 (six) hours as needed for wheezing or shortness of breath.   allopurinol 100 MG tablet Commonly known as:  ZYLOPRIM Take 1 tablet (100 mg total) by mouth daily. What changed:  when to take this   amLODipine 5 MG tablet Commonly known as:  NORVASC Take 1  tablet (5 mg total) by mouth daily.   aspirin 81 MG chewable tablet Chew 81 mg by mouth daily.   carvedilol 3.125 MG tablet Commonly known as:  COREG Take 1 tablet (3.125 mg total) by mouth 2 (two) times daily with a  meal.   cycloSPORINE 0.05 % ophthalmic emulsion Commonly known as:  RESTASIS Place 1 drop into both eyes daily.   digoxin 0.125 MG tablet Commonly known as:  LANOXIN Take 0.5 tablets (0.0625 mg total) by mouth daily.   docusate sodium 100 MG capsule Commonly known as:  COLACE Take 1 capsule (100 mg total) by mouth daily.   furosemide 80 MG tablet Commonly known as:  LASIX Take 0.5 tablets (40 mg total) by mouth 2 (two) times daily. What changed:  how much to take   metolazone 5 MG tablet Commonly known as:  ZAROXOLYN Take 1 tablet (5 mg total) by mouth every Monday, Wednesday, and Friday. What changed:  when to take this  reasons to take this   nitroGLYCERIN 0.4 MG SL tablet Commonly known as:  NITROSTAT Place 0.4 mg under the tongue every 5 (five) minutes as needed for chest pain.   omeprazole 20 MG tablet Commonly known as:  PRILOSEC OTC Take 20 mg by mouth daily as needed (for acid reflux).   PAZEO 0.7 % Soln Generic drug:  Olopatadine HCl Apply 1 drop to eye every morning.   potassium chloride 10 MEQ tablet Commonly known as:  K-DUR,KLOR-CON Take 1 tablet (10 mEq total) by mouth every Monday, Wednesday, and Friday. What changed:  when to take this   spironolactone 25 MG tablet Commonly known as:  ALDACTONE Take 25 mg by mouth daily.   SYSTANE ULTRA 0.4-0.3 % Soln Generic drug:  Polyethyl Glycol-Propyl Glycol Place 1 drop into both eyes daily.   Vitamin D3 1000 units Caps Take 1,000 Units by mouth every Monday.      Follow-up Information    [facility] .   Why:  Registered Nurse for Disease Management/ Dressing Changes.  Contact information: [address] [place] [address] [phone]        [name], MD. Schedule an appointment as soon as possible for a visit in 1 week(s).   Specialty:  Cardiology Contact information: [address] [place] [address] [phone]            Signed: [name] 04/06/2016, 2:30 PM

## 2016-04-06 NOTE — Progress Notes (Signed)
Pharmacy Antibiotic Note  [name] is a 65 y.o. female  with wound infection.  Pharmacy has been consulted for vancomycin dosing. SCr= 1.56 -vancomycin level = 14 (at ~ 10:30am). Last dose was 750mg  on 9/25 at ~ 11am.    Plan: -Continue vancomycin to 750mg  IV Q48hr  (goal level is 10-15) -Consider adding a stop date  -Monitor renal function, clinical progress, abx LOT  Height: 5\' 5"  (165.1 cm) Weight: 145 lb 3.2 oz (65.9 kg) IBW/kg (Calculated) : 57  Temp (24hrs), Avg:98.3 F (36.8 C), Min:98.2 F (36.8 C), Max:98.3 F (36.8 C)   Recent Labs Lab 04/01/16 0842 04/02/16 0321 04/03/16 0432 04/03/16 [id number] 04/04/16 0518 04/04/16 0851 04/05/16 0449 04/06/16 0437 04/06/16 1030  WBC 4.2  --   --   --   --   --   --   --   --   CREATININE 1.28* 1.29* 1.43*  --  1.74*  --  1.53* 1.56*  --   VANCOTROUGH  --   --   --  25*  --   --   --   --  14*  VANCORANDOM  --   --   --   --   --  19  --   --   --     Estimated Creatinine Clearance: 32.8 mL/min (by C-G formula based on SCr of 1.56 mg/dL (H)).    Allergies  Allergen Reactions  . Bidil [Isosorb Dinitrate-Hydralazine] Itching and Other (See Comments)    Jittery, feels like something is crawling    Antimicrobials this admission: 9/20 vanc>> 9/14 ceftriaxone>> 9/23  Dose adjustments this admission: 9/24 VT 25 >> hold and decrease dose from 1250mg  q24 to 750mg  q24h  9/25 VR= 19 at ~ 9am. Change to 750mg  IV q48hr 9/27: VT= 14; no change  Microbiology results: 9/14 wound cx: neg  Harland GermanAndrew Vietta Bonifield, Pharm D 04/06/2016 11:25 AM

## 2016-06-13 ENCOUNTER — Inpatient Hospital Stay (HOSPITAL_COMMUNITY)
Admission: AD | Admit: 2016-06-13 | Discharge: 2016-06-21 | DRG: 291 | Disposition: A | Discharge status: Home / Self Care | Payer: Medicaid Other | Source: Ambulatory Visit | Attending: Cardiovascular Disease | Admitting: Cardiovascular Disease

## 2016-06-13 ENCOUNTER — Inpatient Hospital Stay (HOSPITAL_COMMUNITY): Payer: Medicaid Other

## 2016-06-13 ENCOUNTER — Encounter (HOSPITAL_COMMUNITY): Payer: Self-pay | Admitting: Nurse Practitioner

## 2016-06-13 DIAGNOSIS — I130 Hypertensive heart and chronic kidney disease with heart failure and stage 1 through stage 4 chronic kidney disease, or unspecified chronic kidney disease: Secondary | ICD-10-CM | POA: Diagnosis present

## 2016-06-13 DIAGNOSIS — I5023 Acute on chronic systolic (congestive) heart failure: Secondary | ICD-10-CM | POA: Diagnosis present

## 2016-06-13 DIAGNOSIS — I5082 Biventricular heart failure: Secondary | ICD-10-CM | POA: Diagnosis present

## 2016-06-13 DIAGNOSIS — Z955 Presence of coronary angioplasty implant and graft: Secondary | ICD-10-CM | POA: Diagnosis not present

## 2016-06-13 DIAGNOSIS — L03115 Cellulitis of right lower limb: Secondary | ICD-10-CM | POA: Diagnosis present

## 2016-06-13 DIAGNOSIS — I10 Essential (primary) hypertension: Secondary | ICD-10-CM | POA: Diagnosis present

## 2016-06-13 DIAGNOSIS — R238 Other skin changes: Secondary | ICD-10-CM | POA: Diagnosis present

## 2016-06-13 DIAGNOSIS — N183 Chronic kidney disease, stage 3 (moderate): Secondary | ICD-10-CM | POA: Diagnosis present

## 2016-06-13 DIAGNOSIS — I272 Pulmonary hypertension, unspecified: Secondary | ICD-10-CM | POA: Diagnosis present

## 2016-06-13 DIAGNOSIS — E7800 Pure hypercholesterolemia, unspecified: Secondary | ICD-10-CM | POA: Diagnosis present

## 2016-06-13 DIAGNOSIS — M109 Gout, unspecified: Secondary | ICD-10-CM | POA: Diagnosis present

## 2016-06-13 DIAGNOSIS — I255 Ischemic cardiomyopathy: Secondary | ICD-10-CM | POA: Diagnosis present

## 2016-06-13 DIAGNOSIS — E1122 Type 2 diabetes mellitus with diabetic chronic kidney disease: Secondary | ICD-10-CM | POA: Diagnosis present

## 2016-06-13 DIAGNOSIS — I252 Old myocardial infarction: Secondary | ICD-10-CM

## 2016-06-13 DIAGNOSIS — I2510 Atherosclerotic heart disease of native coronary artery without angina pectoris: Secondary | ICD-10-CM | POA: Diagnosis present

## 2016-06-13 DIAGNOSIS — I5041 Acute combined systolic (congestive) and diastolic (congestive) heart failure: Secondary | ICD-10-CM | POA: Diagnosis present

## 2016-06-13 DIAGNOSIS — Z87891 Personal history of nicotine dependence: Secondary | ICD-10-CM | POA: Diagnosis not present

## 2016-06-13 DIAGNOSIS — E1129 Type 2 diabetes mellitus with other diabetic kidney complication: Secondary | ICD-10-CM

## 2016-06-13 DIAGNOSIS — K219 Gastro-esophageal reflux disease without esophagitis: Secondary | ICD-10-CM | POA: Diagnosis present

## 2016-06-13 DIAGNOSIS — I509 Heart failure, unspecified: Secondary | ICD-10-CM

## 2016-06-13 LAB — CBC WITH DIFFERENTIAL/PLATELET
BASOS ABS: 0 10*3/uL (ref 0.0–0.1)
Basophils Relative: 1 %
Eosinophils Absolute: 0 10*3/uL (ref 0.0–0.7)
Eosinophils Relative: 0 %
HEMATOCRIT: 45.2 % (ref 36.0–46.0)
Hemoglobin: 14.8 g/dL (ref 12.0–15.0)
LYMPHS PCT: 30 %
Lymphs Abs: 1.2 10*3/uL (ref 0.7–4.0)
MCH: 27 pg (ref 26.0–34.0)
MCHC: 32.7 g/dL (ref 30.0–36.0)
MCV: 82.3 fL (ref 78.0–100.0)
Monocytes Absolute: 0.4 10*3/uL (ref 0.1–1.0)
Monocytes Relative: 10 %
NEUTROS ABS: 2.3 10*3/uL (ref 1.7–7.7)
Neutrophils Relative %: 59 %
PLATELETS: 216 10*3/uL (ref 150–400)
RBC: 5.49 MIL/uL — AB (ref 3.87–5.11)
RDW: 17.9 % — ABNORMAL HIGH (ref 11.5–15.5)
WBC: 3.9 10*3/uL — ABNORMAL LOW (ref 4.0–10.5)

## 2016-06-13 LAB — COMPREHENSIVE METABOLIC PANEL
ALT: 12 U/L — AB (ref 14–54)
AST: 25 U/L (ref 15–41)
Albumin: 3.2 g/dL — ABNORMAL LOW (ref 3.5–5.0)
Alkaline Phosphatase: 94 U/L (ref 38–126)
Anion gap: 11 (ref 5–15)
BILIRUBIN TOTAL: 2.3 mg/dL — AB (ref 0.3–1.2)
BUN: 56 mg/dL — AB (ref 6–20)
CHLORIDE: 105 mmol/L (ref 101–111)
CO2: 24 mmol/L (ref 22–32)
CREATININE: 2.12 mg/dL — AB (ref 0.44–1.00)
Calcium: 9 mg/dL (ref 8.9–10.3)
GFR calc Af Amer: 27 mL/min — ABNORMAL LOW (ref >60)
GFR, EST NON AFRICAN AMERICAN: 23 mL/min — AB (ref >60)
GLUCOSE: 126 mg/dL — AB (ref 65–99)
Potassium: 4.2 mmol/L (ref 3.5–5.1)
Sodium: 140 mmol/L (ref 135–145)
Total Protein: 6.7 g/dL (ref 6.5–8.1)

## 2016-06-13 LAB — GLUCOSE, CAPILLARY
GLUCOSE-CAPILLARY: 129 mg/dL — AB (ref 65–99)
GLUCOSE-CAPILLARY: 127 mg/dL — AB (ref 65–99)

## 2016-06-13 LAB — ECHOCARDIOGRAM COMPLETE
HEIGHTINCHES: 65 in
Weight: 2524.8 oz

## 2016-06-13 LAB — BRAIN NATRIURETIC PEPTIDE: B NATRIURETIC PEPTIDE 5: 2414.1 pg/mL — AB (ref 0.0–100.0)

## 2016-06-13 LAB — TROPONIN I
Troponin I: 0.03 ng/mL (ref <0.03)
Troponin I: 0.03 ng/mL (ref <0.03)

## 2016-06-13 MED ORDER — CYCLOSPORINE 0.05 % OP EMUL
1.0000 [drp] | Freq: Every day | OPHTHALMIC | Status: DC
  Administered 2016-06-14 – 2016-06-21 (×8): 1 [drp] via OPHTHALMIC
  Filled 2016-06-13 (×9): qty 1

## 2016-06-13 MED ORDER — DIGOXIN 125 MCG PO TABS
0.0625 mg | ORAL_TABLET | Freq: Every day | ORAL | Status: DC
  Administered 2016-06-14 – 2016-06-21 (×7): 0.0625 mg via ORAL
  Filled 2016-06-13 (×7): qty 1

## 2016-06-13 MED ORDER — ACETAMINOPHEN 325 MG PO TABS
650.0000 mg | ORAL_TABLET | ORAL | Status: DC | PRN
  Administered 2016-06-21: 650 mg via ORAL
  Filled 2016-06-13: qty 2

## 2016-06-13 MED ORDER — SODIUM CHLORIDE 0.9% FLUSH
3.0000 mL | Freq: Two times a day (BID) | INTRAVENOUS | Status: DC
Start: 2016-06-13 — End: 2016-06-21
  Administered 2016-06-13 – 2016-06-21 (×15): 3 mL via INTRAVENOUS

## 2016-06-13 MED ORDER — ALLOPURINOL 100 MG PO TABS
100.0000 mg | ORAL_TABLET | Freq: Every day | ORAL | Status: DC
  Administered 2016-06-13 – 2016-06-21 (×9): 100 mg via ORAL
  Filled 2016-06-13 (×9): qty 1

## 2016-06-13 MED ORDER — DOCUSATE SODIUM 100 MG PO CAPS
100.0000 mg | ORAL_CAPSULE | Freq: Every day | ORAL | Status: DC
  Administered 2016-06-19: 100 mg via ORAL
  Filled 2016-06-13 (×5): qty 1

## 2016-06-13 MED ORDER — POTASSIUM CHLORIDE CRYS ER 10 MEQ PO TBCR
10.0000 meq | EXTENDED_RELEASE_TABLET | ORAL | Status: DC
Start: 2016-06-13 — End: 2016-06-21
  Administered 2016-06-13 – 2016-06-20 (×4): 10 meq via ORAL
  Filled 2016-06-13 (×6): qty 1

## 2016-06-13 MED ORDER — AMLODIPINE BESYLATE 5 MG PO TABS
5.0000 mg | ORAL_TABLET | Freq: Every day | ORAL | Status: DC
  Administered 2016-06-13 – 2016-06-18 (×6): 5 mg via ORAL
  Filled 2016-06-13 (×6): qty 1

## 2016-06-13 MED ORDER — VITAMIN D 1000 UNITS PO TABS
1000.0000 [IU] | ORAL_TABLET | ORAL | Status: DC
  Administered 2016-06-13: 1000 [IU] via ORAL
  Filled 2016-06-13: qty 1

## 2016-06-13 MED ORDER — FUROSEMIDE 10 MG/ML IJ SOLN
80.0000 mg | Freq: Two times a day (BID) | INTRAMUSCULAR | Status: DC
Start: 2016-06-14 — End: 2016-06-21
  Administered 2016-06-14 – 2016-06-21 (×16): 80 mg via INTRAVENOUS
  Filled 2016-06-13 (×16): qty 8

## 2016-06-13 MED ORDER — POLYETHYL GLYCOL-PROPYL GLYCOL 0.4-0.3 % OP SOLN: 1.0000 [drp] | Freq: Every day | OPHTHALMIC | Status: DC

## 2016-06-13 MED ORDER — VITAMIN D3 25 MCG (1000 UT) PO CAPS
1000.0000 [IU] | ORAL_CAPSULE | ORAL | Status: DC
Start: 2016-06-13 — End: 2016-06-13

## 2016-06-13 MED ORDER — HYPROMELLOSE (GONIOSCOPIC) 2.5 % OP SOLN
1.0000 [drp] | Freq: Every day | OPHTHALMIC | Status: DC
  Administered 2016-06-14 – 2016-06-21 (×8): 1 [drp] via OPHTHALMIC
  Filled 2016-06-13 (×2): qty 15

## 2016-06-13 MED ORDER — SPIRONOLACTONE 25 MG PO TABS
25.0000 mg | ORAL_TABLET | Freq: Every day | ORAL | Status: DC
  Administered 2016-06-13 – 2016-06-21 (×9): 25 mg via ORAL
  Filled 2016-06-13 (×9): qty 1

## 2016-06-13 MED ORDER — ASPIRIN 81 MG PO CHEW
81.0000 mg | CHEWABLE_TABLET | Freq: Every day | ORAL | Status: DC
  Administered 2016-06-13 – 2016-06-21 (×9): 81 mg via ORAL
  Filled 2016-06-13 (×9): qty 1

## 2016-06-13 MED ORDER — FUROSEMIDE 10 MG/ML IJ SOLN
40.0000 mg | Freq: Two times a day (BID) | INTRAMUSCULAR | Status: DC
  Administered 2016-06-13: 40 mg via INTRAVENOUS
  Filled 2016-06-13: qty 4

## 2016-06-13 MED ORDER — ONDANSETRON HCL 4 MG/2ML IJ SOLN: 4.0000 mg | Freq: Four times a day (QID) | INTRAMUSCULAR | Status: DC | PRN

## 2016-06-13 MED ORDER — HEPARIN SODIUM (PORCINE) 5000 UNIT/ML IJ SOLN
5000.0000 [IU] | Freq: Three times a day (TID) | INTRAMUSCULAR | Status: DC
  Administered 2016-06-13 – 2016-06-21 (×4): 5000 [IU] via SUBCUTANEOUS
  Filled 2016-06-13 (×5): qty 1

## 2016-06-13 MED ORDER — SODIUM CHLORIDE 0.9 % IV SOLN: 250.0000 mL | INTRAVENOUS | Status: DC | PRN

## 2016-06-13 MED ORDER — SODIUM CHLORIDE 0.9% FLUSH: 3.0000 mL | INTRAVENOUS | Status: DC | PRN

## 2016-06-13 MED ORDER — DIGOXIN 125 MCG PO TABS
0.0625 mg | ORAL_TABLET | Freq: Every day | ORAL | Status: DC
  Administered 2016-06-13: 0.0625 mg via ORAL
  Filled 2016-06-13: qty 1

## 2016-06-13 MED ORDER — PANTOPRAZOLE SODIUM 40 MG PO TBEC: 40.0000 mg | DELAYED_RELEASE_TABLET | Freq: Every day | ORAL | Status: DC | PRN

## 2016-06-13 MED ORDER — OMEPRAZOLE MAGNESIUM 20 MG PO TBEC: 20.0000 mg | DELAYED_RELEASE_TABLET | Freq: Every day | ORAL | Status: DC | PRN

## 2016-06-13 MED ORDER — CARVEDILOL 3.125 MG PO TABS
3.1250 mg | ORAL_TABLET | Freq: Two times a day (BID) | ORAL | Status: DC
  Administered 2016-06-13 – 2016-06-14 (×3): 3.125 mg via ORAL
  Filled 2016-06-13 (×3): qty 1

## 2016-06-13 NOTE — Progress Notes (Signed)
CRITICAL VALUE ALERT  Critical value received:  Troponin 0.03  Date of notification:  06/13/16  Time of notification:  1605  Critical value read back:Yes.    Nurse who received alert:  Courtney HeysLauren Mueller, RN  MD notified (1st page):  Dr. Algie CofferKadakia  Time of first page:  1607  MD notified (2nd page): Dr. Algie CofferKadakia  Time of second page: 1635  Responding MD:  Dr. Algie CofferKadakia  Time MD responded:  [phone]

## 2016-06-13 NOTE — Progress Notes (Signed)
Echocardiogram 2D Echocardiogram has been performed.  [name] 06/13/2016, 4:00 PM

## 2016-06-13 NOTE — Progress Notes (Signed)
Patient arrived to the unit via wheelchair.  Patient alert and oriented and educated on the room, call bell, and phone.  Dr. Algie CofferKadakia paged and aware that patient has arrived to the unit.

## 2016-06-13 NOTE — H&P (Signed)
Referring Physician: Orpah CobbAjay Tylesha Gibeault, MD.  Tanya SpikeMargaret A [name] is an 65 y.o. female.                       Chief Complaint: Shortness of breath and leg edema.  HPI: [id number] year old female with known history of Bi-ventricular failure has 25 pounds weight gain, shortness of breath and leg edema without chest pain, fever or cough. She also has blisters on right lower leg.  Past Medical History:  Diagnosis Date  . CHF (congestive heart failure) (HCC)   . Coronary artery disease   . GERD (gastroesophageal reflux disease)   . Gout   . High cholesterol   . Hypertension   . Shortness of breath dyspnea   . Type II diabetes mellitus (HCC)       Past Surgical History:  Procedure Laterality Date  . CARDIAC CATHETERIZATION  2014  . CORONARY ANGIOPLASTY WITH STENT PLACEMENT  2013  . LEFT AND RIGHT HEART CATHETERIZATION WITH CORONARY ANGIOGRAM N/A 11/25/2011   Procedure: LEFT AND RIGHT HEART CATHETERIZATION WITH CORONARY ANGIOGRAM;  Surgeon: Tanya PaneMohan N Harwani, MD;  Location: [facility] CATH [facility];  Service: Cardiovascular;  Laterality: N/A;  . LEFT AND RIGHT HEART CATHETERIZATION WITH CORONARY ANGIOGRAM N/A 09/07/2012   Procedure: LEFT AND RIGHT HEART CATHETERIZATION WITH CORONARY ANGIOGRAM;  Surgeon: Ricki RodriguezAjay S Sadeel Fiddler, MD;  Location: [facility]ATH [facility];  Service: Cardiovascular;  Laterality: N/A;  . PERCUTANEOUS CORONARY STENT INTERVENTION (PCI-S) Right 11/25/2011   Procedure: PERCUTANEOUS CORONARY STENT INTERVENTION (PCI-S);  Surgeon: Tanya PaneMohan N Harwani, MD;  [facility];  Service: Cardiovascular;  Laterality: Right;  . TONSILLECTOMY  1950's    History reviewed. No pertinent family history. Social History:  reports that she has quit smoking. Her smoking use included Cigarettes. She has a 10.00 pack-year smoking history. She has never used smokeless tobacco. She reports that she does not drink alcohol or use drugs.  Allergies:  Allergies  Allergen Reactions  . Bidil [Isosorb Dinitrate-Hydralazine] Itching and Other (See  Comments)    Jittery, feels like something is crawling    Medications Prior to Admission  Medication Sig Dispense Refill  . acetaminophen (TYLENOL) 500 MG tablet Take 500 mg by mouth every 6 (six) hours as needed for moderate pain or headache.    . albuterol (PROVENTIL HFA;VENTOLIN HFA) 108 (90 Base) MCG/ACT inhaler Inhale 2 puffs into the lungs every 6 (six) hours as needed for wheezing or shortness of breath.    . allopurinol (ZYLOPRIM) 100 MG tablet Take 1 tablet (100 mg total) by mouth daily. (Patient taking differently: Take 100 mg by mouth 2 (two) times daily. )    . amLODipine (NORVASC) 5 MG tablet Take 1 tablet (5 mg total) by mouth daily. 30 tablet 1  . aspirin 81 MG chewable tablet Chew 81 mg by mouth daily.    . carvedilol (COREG) 3.125 MG tablet Take 1 tablet (3.125 mg total) by mouth 2 (two) times daily with a meal. 60 tablet 3  . Cholecalciferol (VITAMIN D3) 1000 units CAPS Take 1,000 Units by mouth every Monday.    . cycloSPORINE (RESTASIS) 0.05 % ophthalmic emulsion Place 1 drop into both eyes daily.    . digoxin (LANOXIN) 0.125 MG tablet Take 0.5 tablets (0.0625 mg total) by mouth daily. 15 tablet 1  . docusate sodium (COLACE) 100 MG capsule Take 1 capsule (100 mg total) by mouth daily. 30 capsule 3  . furosemide (LASIX) 80 MG tablet Take 0.5 tablets (40 mg  total) by mouth 2 (two) times daily.    . metolazone (ZAROXOLYN) 5 MG tablet Take 1 tablet (5 mg total) by mouth every Monday, Wednesday, and Friday. 15 tablet 3  . nitroGLYCERIN (NITROSTAT) 0.4 MG SL tablet Place 0.4 mg under the tongue every 5 (five) minutes as needed for chest pain.    Marland Kitchen. Olopatadine HCl (PAZEO) 0.7 % SOLN Apply 1 drop to eye every morning.    Marland Kitchen. omeprazole (PRILOSEC OTC) 20 MG tablet Take 20 mg by mouth daily as needed (for acid reflux).    [name]. Polyethyl Glycol-Propyl Glycol (SYSTANE ULTRA) 0.4-0.3 % SOLN Place 1 drop into both eyes daily.    . potassium chloride (K-DUR,KLOR-CON) 10 MEQ tablet Take 1 tablet  (10 mEq total) by mouth every Monday, Wednesday, and Friday. (Patient taking differently: Take 10 mEq by mouth every Monday. )    . spironolactone (ALDACTONE) 25 MG tablet Take 25 mg by mouth daily.      Results for orders placed or performed during the hospital encounter of 06/13/16 (from the past 48 hour(s))  Glucose, capillary     Status: Abnormal   Collection Time: 06/13/16 12:30 PM  Result Value Ref Range   Glucose-Capillary 129 (H) 65 - 99 mg/dL   Comment 1 Notify RN    No results found.  Review Of Systems Constitutional: No fever, chills , positive weight gain. Eyes: No vision change, Wears glasses. No discharge or pain.. Ears: No hearing loss, No tinnitus. Respiratory: No asthma, COPD, pneumonias. Positive for shortness of breath. No hemoptysis. Cardiovascular: No chest pain, palpitation. Positive for leg edema. Gastrointestinal: No nausea, vomiting or diarrhea. Positive for constipation. No GI bleed. No hepatitis. Genitourinary: No dysuria, hematuria or kidney stone. No incontinance. Neurological: No stroke or seizures.  Psychiatry: No psych facility admission for anxiety, depression or suicide. No detox. Skin: No rash. Musculoskeletal: No joint pain or fibromyalgia. Positive neck pain and back pain. Lymphadenopathy: No lymphadenopathy Hematology: No anemia or easy bruising.   Blood pressure (!) 174/101, pulse 84, resp. rate 18, height 5\' 5"  (1.651 m), weight 71.6 kg (157 lb 12.8 oz), SpO2 97 %. Body mass index is 26.26 kg/m. General appearance: alert, cooperative, appears stated age and mild distress Head: Normocephalic, atraumatic. Eyes: Brown eyes, conjunctivae/corneas clear. PERRL, EOM's intact.  Neck: no adenopathy, no carotid bruit, no JVD, supple, symmetrical, trachea midline and thyroid not enlarged. Resp: clear to auscultation bilaterally Cardio: regular rate and rhythm, S1, S2 normal, II/VI systolic murmur, no click, rub or gallop GI: soft, non-tender; bowel  sounds normal; no masses,  no organomegaly. Extremities: 2 + edema up to thighs bilaterally. Positive pre-sacral edema. Skin: Warm and dry. No rashes or lesions Neurologic: Alert and oriented X 3, normal strength and tone. Normal coordination and slow gait with cane use.  Assessment/Plan Acute on chronic left systolic heart failure Hypertension CAD S/P stent in LAD DM, II  Admit. IV lasix. Echocardiogram Home medications.  [name],Gaelle Adriance S, MD  06/13/2016, 1:12 PM

## 2016-06-13 NOTE — Progress Notes (Signed)
Patient alarmed 7 beats v-tach.  Patient asymptomatic.  Dr. Algie CofferKadakia made aware.  Will continue to monitor.

## 2016-06-14 ENCOUNTER — Inpatient Hospital Stay (HOSPITAL_COMMUNITY): Payer: Medicaid Other

## 2016-06-14 NOTE — Care Management Note (Signed)
Case Management Note  Patient Details  Name: [name] MRN: [id number] Date of Birth: 1951/03/31  Subjective/Objective:      Admitted with CHF              Action/Plan: Patient known to me from previous admissions. Lives at home with spouse; PCP is Dr Andrey CampanileWilson; no medical insurance at this time. Artistinancial Counselor to see pt; pharmacy of choice is [facility]; she has scales at home and knows to weigh herself daily and eats a diet low in sodium. CM will continue to follow for DCP.  Expected Discharge Date:   possibly 06/17/2016            Expected Discharge Plan:  Home/Self Care  Discharge planning Services  CM Consult   Status of Service:  In process, will continue to follow  Reola MosherChandler, Kaulder Zahner L, RN,MHA,BSN [phone] 06/14/2016, 2:01 PM

## 2016-06-14 NOTE — Progress Notes (Signed)
Ref: Tanya Willbanks S, MD   Subjective:  Some nausea and shortness of breath.  Objective:  Vital Signs in the last 24 hours: Temp:  [97.5 F (36.4 C)-97.8 F (36.6 C)] 97.6 F (36.4 C) (12/05 0742) Pulse Rate:  [75-84] 80 (12/05 0742) Cardiac Rhythm: Normal sinus rhythm (12/04 1955) Resp:  [18-19] 19 (12/05 0742) BP: (150-174)/(83-101) 150/89 (12/05 0742) SpO2:  [97 %-100 %] 99 % (12/05 0742) Weight:  [71.6 kg (157 lb 12.8 oz)-72.2 kg (159 lb 1.6 oz)] 72.2 kg (159 lb 1.6 oz) (12/05 16100742)  Physical Exam: BP Readings from Last 1 Encounters:  06/14/16 (!) 150/89    Wt Readings from Last 1 Encounters:  06/14/16 72.2 kg (159 lb 1.6 oz)    Weight change:  Body mass index is 26.48 kg/m. HEENT: [place]/AT, Eyes-Brown, PERL, EOMI, Conjunctiva-Pink, Sclera-Non-icteric Neck: Full JVD at 90 degree angle, No bruit, Trachea midline. Lungs:  Clear, Bilateral. Cardiac:  Regular rhythm, normal S1 and S2, no S3. II/VI systolic murmur. Abdomen:  Soft, non-tender. BS present. Extremities:  2 + lower leg and presacral edema present. No cyanosis. No clubbing. Right lower leg superficial wound covered with dressing. CNS: AxOx3, Cranial nerves grossly intact, moves all 4 extremities.  Skin: Warm and dry.   Intake/Output from previous day: 12/04 0701 - 12/05 0700 In: 603 [P.O.:600; I.V.:3] Out: 300 [Urine:300]    Lab Results: BMET    Component Value Date/Time   NA 140 06/13/2016 1440   NA 135 04/06/2016 0437   NA 134 (L) 04/05/2016 0449   K 4.2 06/13/2016 1440   K 3.9 04/06/2016 0437   K 4.2 04/05/2016 0449   CL 105 06/13/2016 1440   CL 98 (L) 04/06/2016 0437   CL 99 (L) 04/05/2016 0449   CO2 24 06/13/2016 1440   CO2 25 04/06/2016 0437   CO2 23 04/05/2016 0449   GLUCOSE 126 (H) 06/13/2016 1440   GLUCOSE 93 04/06/2016 0437   GLUCOSE 120 (H) 04/05/2016 0449   BUN 56 (H) 06/13/2016 1440   BUN 44 (H) 04/06/2016 0437   BUN 44 (H) 04/05/2016 0449   CREATININE 2.12 (H) 06/13/2016 1440    CREATININE 1.56 (H) 04/06/2016 0437   CREATININE 1.53 (H) 04/05/2016 0449   CALCIUM 9.0 06/13/2016 1440   CALCIUM 9.2 04/06/2016 0437   CALCIUM 9.2 04/05/2016 0449   GFRNONAA 23 (L) 06/13/2016 1440   GFRNONAA 34 (L) 04/06/2016 0437   GFRNONAA 35 (L) 04/05/2016 0449   GFRAA 27 (L) 06/13/2016 1440   GFRAA 39 (L) 04/06/2016 0437   GFRAA 40 (L) 04/05/2016 0449   CBC    Component Value Date/Time   WBC 3.9 (L) 06/13/2016 1440   RBC 5.49 (H) 06/13/2016 1440   HGB 14.8 06/13/2016 1440   HCT 45.2 06/13/2016 1440   PLT 216 06/13/2016 1440   MCV 82.3 06/13/2016 1440   MCH 27.0 06/13/2016 1440   MCHC 32.7 06/13/2016 1440   RDW 17.9 (H) 06/13/2016 1440   LYMPHSABS 1.2 06/13/2016 1440   MONOABS 0.4 06/13/2016 1440   EOSABS 0.0 06/13/2016 1440   BASOSABS 0.0 06/13/2016 1440   HEPATIC Function Panel  Recent Labs  07/03/15 1618 03/24/16 1553 06/13/16 1440  PROT 7.0 6.9 6.7   HEMOGLOBIN A1C No components found for: HGA1C,  MPG CARDIAC ENZYMES Lab Results  Component Value Date   CKTOTAL 59 09/07/2012   CKMB 3.2 09/07/2012   TROPONINI 0.03 (HH) 06/13/2016   TROPONINI 0.03 (HH) 06/13/2016   TROPONINI <0.03 07/04/2015  BNP No results for input([name]): PROBNP in the last 8760 hours. TSH No results for input([name]): TSH in the last 8760 hours. CHOLESTEROL No results for input([name]): CHOL in the last 8760 hours.  Scheduled Meds: . allopurinol  100 mg Oral Daily  . amLODipine  5 mg Oral Daily  . aspirin  81 mg Oral Daily  . carvedilol  3.125 mg Oral BID WC  . cholecalciferol  1,000 Units Oral Q7 weeks  . cycloSPORINE  1 drop Both Eyes Daily  . digoxin  0.0625 mg Oral Daily  . docusate sodium  100 mg Oral Daily  . furosemide  80 mg Intravenous BID  . heparin  5,000 Units Subcutaneous Q8H  . hydroxypropyl methylcellulose / hypromellose  1 drop Both Eyes Daily  . potassium chloride  10 mEq Oral Q M,W,F  . sodium chloride flush  3 mL Intravenous Q12H  . spironolactone  25 mg Oral Daily    Continuous Infusions: PRN Meds:.sodium chloride, acetaminophen, ondansetron (ZOFRAN) IV, pantoprazole, sodium chloride flush  Assessment/Plan: Acute on chronic bi-ventricular failure Hypertension CAD [name] DM, II  Increase lasix to 80 mg. twice daily.   LOS: 1 day    Orpah CobbAjay Rondy Krupinski  MD  06/14/2016, 9:44 AM

## 2016-06-14 NOTE — Progress Notes (Signed)
Nutrition Education Note  RD consulted for nutrition education regarding new onset CHF. Per review of chart, RD has met with patient in the past for CHF/LS diet education.  Pt states that she knows how to follow a low sodium diet and usually does, but she has been eating foods that she knows are high in sodium recently. She states that she never adds salt to food, but she gets tired of eating the same things and ends up eating higher sodium foods. RD provided "Low Sodium Nutrition Therapy" handout from the Academy of [employer]. Reviewed patient's dietary recall.  Encouraged intake of fresh fruits and vegetables, unsalted nuts, milk, yogurt as well as whole grain sources of carbohydrates. RD discussed low sodium meal ideas. .  Teach back method used. Expect fair compliance. RD recommended outpatient nutrition counseling and provided information about [facility].  Body mass index is 26.48 kg/m. Pt meets criteria for Overweight based on current BMI.  Current diet order is Heart Healthy/Carb Modified, patient is consuming approximately 75% of meals at this time. Pt states that usually she has a good appetite and maintains weight at 137 lbs. She reports eating less recently due to edema, but feels this will improve with diuresis and declines and nutrition interventions at this time. Labs and medications reviewed. No further nutrition interventions warranted at this time. RD contact information provided. If additional nutrition issues arise, please re-consult RD.   Scarlette Ar RD, CSP, LDN Inpatient Clinical Dietitian Pager: [phone] After Hours Pager: [phone]

## 2016-06-15 LAB — BASIC METABOLIC PANEL
ANION GAP: 14 (ref 5–15)
BUN: 59 mg/dL — ABNORMAL HIGH (ref 6–20)
CO2: 21 mmol/L — ABNORMAL LOW (ref 22–32)
Calcium: 9 mg/dL (ref 8.9–10.3)
Chloride: 103 mmol/L (ref 101–111)
Creatinine, Ser: 1.92 mg/dL — ABNORMAL HIGH (ref 0.44–1.00)
GFR calc Af Amer: 31 mL/min — ABNORMAL LOW (ref >60)
GFR, EST NON AFRICAN AMERICAN: 26 mL/min — AB (ref >60)
Glucose, Bld: 115 mg/dL — ABNORMAL HIGH (ref 65–99)
POTASSIUM: 4.4 mmol/L (ref 3.5–5.1)
SODIUM: 138 mmol/L (ref 135–145)

## 2016-06-15 MED ORDER — CARVEDILOL 3.125 MG PO TABS
3.1250 mg | ORAL_TABLET | Freq: Two times a day (BID) | ORAL | Status: DC
  Administered 2016-06-15 – 2016-06-21 (×13): 3.125 mg via ORAL
  Filled 2016-06-15 (×13): qty 1

## 2016-06-15 NOTE — Consult Note (Addendum)
WOC Nurse wound consult note Reason for Consult:RLE blisters, intact and ruptured (patient wants cream she used previously, I called pharmacy [facility] [address] and they said the cream was Santyl.  The wound does not need santyl at this point) Wound type:partial thickness Pressure Ulcer POA: No, not related to pressure Measurement:has two intact blisters filled with clear fluid, 1.5cm x 1.5cm on distal pretibial area, 1.5 x 1.0 medial calf Pt has 4 open already ruptured blister areas ranging from 0.25cm x 1cm x 0.1cm proximal pretibial, 1cm x 0.5cm x 0.1cm  Pretibial 0.25 cm x 1cm x 0.1cm pretibial and 1cm x 0.75cm x 0.1cm medial calf, 100% pink, scant drainage, intact skin periwound, skin very tight with edema Wound bed: see above Drainage (amount, consistency, odor) see above Periwound:see above Dressing procedure/placement/frequency:I have provided nurses with orders for cleanse RLE with NS, pat dry, cover blistered and open areas with one Xeroform 5 x 9 ([place] # [id number]) cover with dry gauze and wrap with wide kling wrap, change BID due to dressing gets damp and uncomfortable for pt.  Discussed with pt the benefit of a compression wrap to help with the edema and pt stated she would not keep that on because every compression wrap she has tried has hurt. Have explained to pt why Santyl not being ordered at this time. We will not follow, but will remain available to this patient, to nursing, and the medical and/or surgical teams.  Please re-consult if we need to assist further.    Barnett HatterMelinda Keiley Levey, RN-C, WTA-C Wound Treatment Associate

## 2016-06-15 NOTE — Progress Notes (Signed)
Pharmacist Heart Failure Core Measure Documentation  Assessment: [name] has an EF documented as 25-30% on 06/13/16.  Rationale: Heart failure patients with left ventricular systolic dysfunction (LVSD) and an EF < 40% should be prescribed an angiotensin converting enzyme inhibitor (ACEI) or angiotensin receptor blocker (ARB) at discharge unless a contraindication is documented in the medical record.  This patient is not currently on an ACEI or ARB for HF.  This note is being placed in the record in order to provide documentation that a contraindication to the use of these agents is present for this encounter.  ACE Inhibitor or Angiotensin Receptor Blocker is contraindicated (specify all that apply)  []   ACEI allergy AND ARB allergy []   Angioedema []   Moderate or severe aortic stenosis []   Hyperkalemia []   Hypotension []   Renal artery stenosis [x]   Worsening renal function, preexisting renal disease or dysfunction   Deagen Krass, Drake LeachRachel Lynn 06/15/2016 2:54 PM

## 2016-06-16 LAB — BASIC METABOLIC PANEL
Anion gap: 10 (ref 5–15)
BUN: 59 mg/dL — ABNORMAL HIGH (ref 6–20)
CHLORIDE: 102 mmol/L (ref 101–111)
CO2: 26 mmol/L (ref 22–32)
CREATININE: 1.9 mg/dL — AB (ref 0.44–1.00)
Calcium: 9.2 mg/dL (ref 8.9–10.3)
GFR, EST AFRICAN AMERICAN: 31 mL/min — AB (ref >60)
GFR, EST NON AFRICAN AMERICAN: 27 mL/min — AB (ref >60)
Glucose, Bld: 123 mg/dL — ABNORMAL HIGH (ref 65–99)
POTASSIUM: 4.6 mmol/L (ref 3.5–5.1)
SODIUM: 138 mmol/L (ref 135–145)

## 2016-06-16 NOTE — Progress Notes (Signed)
Ref: [name],Dondre Catalfamo S, MD  Late Entry Subjective:  Feeling better.  Objective:  Vital Signs in the last 24 hours: Temp:  [97.3 F (36.3 C)-97.8 F (36.6 C)] 97.3 F (36.3 C) (12/07 0636) Pulse Rate:  [57-73] 73 (12/07 0636) Cardiac Rhythm: Normal sinus rhythm (12/07 0842) Resp:  [18-20] 18 (12/07 0636) BP: (133-152)/(72-90) 152/90 (12/07 0636) SpO2:  [98 %-100 %] 98 % (12/07 0636) Weight:  [70.1 kg (154 lb 8 oz)] 70.1 kg (154 lb 8 oz) (12/07 0636)  Physical Exam: BP Readings from Last 1 Encounters:  06/16/16 (!) 152/90    Wt Readings from Last 1 Encounters:  06/16/16 70.1 kg (154 lb 8 oz)    Weight change: -1.134 kg (-2 lb 8 oz) Body mass index is 25.71 kg/m. HEENT: [place]/AT, Eyes-Brown, PERL, EOMI, Conjunctiva-Pink, Sclera-Non-icteric Neck: + JVD, No bruit, Trachea midline. Lungs:  Clear, Bilateral. Cardiac:  Regular rhythm, normal S1 and S2, no S3. II/VI systolic murmur. Abdomen:  Soft, non-tender. BS present. Extremities:  2 + edema present. No cyanosis. No clubbing. CNS: AxOx3, Cranial nerves grossly intact, moves all 4 extremities.  Skin: Warm and dry.   Intake/Output from previous day: 12/06 0701 - 12/07 0700 In: 720 [P.O.:720] Out: 2100 [Urine:2100]    Lab Results: BMET    Component Value Date/Time   NA 138 06/16/2016 0258   NA 138 06/15/2016 0356   NA 140 06/13/2016 1440   K 4.6 06/16/2016 0258   K 4.4 06/15/2016 0356   K 4.2 06/13/2016 1440   CL 102 06/16/2016 0258   CL 103 06/15/2016 0356   CL 105 06/13/2016 1440   CO2 26 06/16/2016 0258   CO2 21 (L) 06/15/2016 0356   CO2 24 06/13/2016 1440   GLUCOSE 123 (H) 06/16/2016 0258   GLUCOSE 115 (H) 06/15/2016 0356   GLUCOSE 126 (H) 06/13/2016 1440   BUN 59 (H) 06/16/2016 0258   BUN 59 (H) 06/15/2016 0356   BUN 56 (H) 06/13/2016 1440   CREATININE 1.90 (H) 06/16/2016 0258   CREATININE 1.92 (H) 06/15/2016 0356   CREATININE 2.12 (H) 06/13/2016 1440   CALCIUM 9.2 06/16/2016 0258   CALCIUM 9.0 06/15/2016  0356   CALCIUM 9.0 06/13/2016 1440   GFRNONAA 27 (L) 06/16/2016 0258   GFRNONAA 26 (L) 06/15/2016 0356   GFRNONAA 23 (L) 06/13/2016 1440   GFRAA 31 (L) 06/16/2016 0258   GFRAA 31 (L) 06/15/2016 0356   GFRAA 27 (L) 06/13/2016 1440   CBC    Component Value Date/Time   WBC 3.9 (L) 06/13/2016 1440   RBC 5.49 (H) 06/13/2016 1440   HGB 14.8 06/13/2016 1440   HCT 45.2 06/13/2016 1440   PLT 216 06/13/2016 1440   MCV 82.3 06/13/2016 1440   MCH 27.0 06/13/2016 1440   MCHC 32.7 06/13/2016 1440   RDW 17.9 (H) 06/13/2016 1440   LYMPHSABS 1.2 06/13/2016 1440   MONOABS 0.4 06/13/2016 1440   EOSABS 0.0 06/13/2016 1440   BASOSABS 0.0 06/13/2016 1440   HEPATIC Function Panel  Recent Labs  07/03/15 1618 03/24/16 1553 06/13/16 1440  PROT 7.0 6.9 6.7   HEMOGLOBIN A1C No components found for: HGA1C,  MPG CARDIAC ENZYMES Lab Results  Component Value Date   CKTOTAL 59 09/07/2012   CKMB 3.2 09/07/2012   TROPONINI 0.03 (HH) 06/13/2016   TROPONINI 0.03 (HH) 06/13/2016   TROPONINI <0.03 07/04/2015   BNP No results for input(s): PROBNP in the last 8760 hours. TSH No results for input(s): TSH in the last 8760  hours. CHOLESTEROL No results for input(s): CHOL in the last 8760 hours.  Scheduled Meds: . allopurinol  100 mg Oral Daily  . amLODipine  5 mg Oral Daily  . aspirin  81 mg Oral Daily  . carvedilol  3.125 mg Oral BID WC  . cholecalciferol  1,000 Units Oral Q7 weeks  . cycloSPORINE  1 drop Both Eyes Daily  . digoxin  0.0625 mg Oral Daily  . docusate sodium  100 mg Oral Daily  . furosemide  80 mg Intravenous BID  . heparin  5,000 Units Subcutaneous Q8H  . hydroxypropyl methylcellulose / hypromellose  1 drop Both Eyes Daily  . potassium chloride  10 mEq Oral Q M,W,F  . sodium chloride flush  3 mL Intravenous Q12H  . spironolactone  25 mg Oral Daily   Continuous Infusions: PRN Meds:.sodium chloride, acetaminophen, ondansetron (ZOFRAN) IV, pantoprazole, sodium chloride  flush  Assessment/Plan: Acute on chronic bi-ventricular failure CAD Hypertension S/P LAD stent DM, II  Continue diuresis.   LOS: 2 days    Orpah CobbAjay Parish Dubose  MD  06/16/2016, 9:18 AM

## 2016-06-16 NOTE — Progress Notes (Signed)
Ref: [name],[name], MD   Subjective:  Breathing better  Objective:  Vital Signs in the last 24 hours: Temp:  [97.3 F (36.3 C)-97.8 F (36.6 C)] 97.3 F (36.3 C) (12/07 0636) Pulse Rate:  [57-73] 73 (12/07 0636) Cardiac Rhythm: Normal sinus rhythm (12/07 0842) Resp:  [18-20] 18 (12/07 0636) BP: (133-152)/(72-90) 152/90 (12/07 0636) SpO2:  [98 %-100 %] 98 % (12/07 0636) Weight:  [70.1 kg (154 lb 8 oz)] 70.1 kg (154 lb 8 oz) (12/07 0636)  Physical Exam: BP Readings from Last 1 Encounters:  06/16/16 (!) 152/90    Wt Readings from Last 1 Encounters:  06/16/16 70.1 kg (154 lb 8 oz)    Weight change: -1.134 kg (-2 lb 8 oz) Body mass index is 25.71 kg/m. HEENT: [place]/AT, Eyes-Brown, PERL, EOMI, Conjunctiva-Pink, Sclera-Non-icteric Neck: + JVD, No bruit, Trachea midline. Lungs:  Clear, Bilateral. Cardiac:  Regular rhythm, normal S1 and S2, no S3. II/VI systolic murmur. Abdomen:  Soft, non-tender. BS present. Extremities:  2+ edema and right lower leg dressing present. No cyanosis. No clubbing. CNS: AxOx3, Cranial nerves grossly intact, moves all 4 extremities.  Skin: Warm and dry.   Intake/Output from previous day: 12/06 0701 - 12/07 0700 In: 720 [P.O.:720] Out: 2100 [Urine:2100]    Lab Results: BMET    Component Value Date/Time   NA 138 06/16/2016 0258   NA 138 06/15/2016 0356   NA 140 06/13/2016 1440   K 4.6 06/16/2016 0258   K 4.4 06/15/2016 0356   K 4.2 06/13/2016 1440   CL 102 06/16/2016 0258   CL 103 06/15/2016 0356   CL 105 06/13/2016 1440   CO2 26 06/16/2016 0258   CO2 21 (L) 06/15/2016 0356   CO2 24 06/13/2016 1440   GLUCOSE 123 (H) 06/16/2016 0258   GLUCOSE 115 (H) 06/15/2016 0356   GLUCOSE 126 (H) 06/13/2016 1440   BUN 59 (H) 06/16/2016 0258   BUN 59 (H) 06/15/2016 0356   BUN 56 (H) 06/13/2016 1440   CREATININE 1.90 (H) 06/16/2016 0258   CREATININE 1.92 (H) 06/15/2016 0356   CREATININE 2.12 (H) 06/13/2016 1440   CALCIUM 9.2 06/16/2016 0258   CALCIUM 9.0 06/15/2016 0356   CALCIUM 9.0 06/13/2016 1440   GFRNONAA 27 (L) 06/16/2016 0258   GFRNONAA 26 (L) 06/15/2016 0356   GFRNONAA 23 (L) 06/13/2016 1440   GFRAA 31 (L) 06/16/2016 0258   GFRAA 31 (L) 06/15/2016 0356   GFRAA 27 (L) 06/13/2016 1440   CBC    Component Value Date/Time   WBC 3.9 (L) 06/13/2016 1440   RBC 5.49 (H) 06/13/2016 1440   HGB 14.8 06/13/2016 1440   HCT 45.2 06/13/2016 1440   PLT 216 06/13/2016 1440   MCV 82.3 06/13/2016 1440   MCH 27.0 06/13/2016 1440   MCHC 32.7 06/13/2016 1440   RDW 17.9 (H) 06/13/2016 1440   LYMPHSABS 1.2 06/13/2016 1440   MONOABS 0.4 06/13/2016 1440   EOSABS 0.0 06/13/2016 1440   BASOSABS 0.0 06/13/2016 1440   HEPATIC Function Panel  Recent Labs  07/03/15 1618 03/24/16 1553 06/13/16 1440  PROT 7.0 6.9 6.7   HEMOGLOBIN A1C No components found for: HGA1C,  MPG CARDIAC ENZYMES Lab Results  Component Value Date   CKTOTAL 59 09/07/2012   CKMB 3.2 09/07/2012   TROPONINI 0.03 (HH) 06/13/2016   TROPONINI 0.03 (HH) 06/13/2016   TROPONINI <0.03 07/04/2015   BNP No results for input(s): PROBNP in the last 8760 hours. TSH No results for input(s): TSH in the  last 8760 hours. CHOLESTEROL No results for input(s): CHOL in the last 8760 hours.  Scheduled Meds: . allopurinol  100 mg Oral Daily  . amLODipine  5 mg Oral Daily  . aspirin  81 mg Oral Daily  . carvedilol  3.125 mg Oral BID WC  . cholecalciferol  1,000 Units Oral Q7 weeks  . cycloSPORINE  1 drop Both Eyes Daily  . digoxin  0.0625 mg Oral Daily  . docusate sodium  100 mg Oral Daily  . furosemide  80 mg Intravenous BID  . heparin  5,000 Units Subcutaneous Q8H  . hydroxypropyl methylcellulose / hypromellose  1 drop Both Eyes Daily  . potassium chloride  10 mEq Oral Q M,W,F  . sodium chloride flush  3 mL Intravenous Q12H  . spironolactone  25 mg Oral Daily   Continuous Infusions: PRN Meds:.sodium chloride, acetaminophen, ondansetron (ZOFRAN) IV, pantoprazole,  sodium chloride flush  Assessment/Plan: Acute on chronic bi-ventricular failure CAD Hypertension S/P LAD stent DM, II  Continue medical treatment.   LOS: 3 days    Tanya CobbAjay Jazmene Racz  MD  06/16/2016, 9:21 AM

## 2016-06-16 NOTE — Progress Notes (Signed)
Dressing changed per order. Pt. tolerated well. Will continue to monitor. 

## 2016-06-17 NOTE — Progress Notes (Signed)
Ref: Anitha Kreiser S, MD   Subjective:  Lost 5-6 pounds of fluids so far. Breathing better.   Objective:  Vital Signs in the last 24 hours: Temp:  [97.5 F (36.4 C)-97.6 F (36.4 C)] 97.6 F (36.4 C) (12/08 0535) Pulse Rate:  [64-73] 73 (12/08 0535) Cardiac Rhythm: Normal sinus rhythm (12/07 2008) Resp:  [20] 20 (12/08 0535) BP: (149-159)/(88) 159/88 (12/08 0535) SpO2:  [100 %] 100 % (12/08 0535) Weight:  [68.6 kg (151 lb 4.8 oz)] 68.6 kg (151 lb 4.8 oz) (12/08 0535)  Physical Exam: BP Readings from Last 1 Encounters:  06/17/16 (!) 159/88    Wt Readings from Last 1 Encounters:  06/17/16 68.6 kg (151 lb 4.8 oz)    Weight change: -2.404 kg (-5 lb 4.8 oz) Body mass index is 25.18 kg/m. HEENT: [place]/AT, Eyes-Brown, PERL, EOMI, Conjunctiva-Pink, Sclera-Non-icteric Neck: + JVD, No bruit, Trachea midline. Lungs:  Clear, Bilateral. Cardiac:  Regular rhythm, normal S1 and S2, no S3. II/VI systolic murmur. Abdomen:  Soft, non-tender. BS present. Extremities:  2 + edema up to knees and 1 + pre-sacral present. No cyanosis. No clubbing. Right leg dressing applied. CNS: AxOx3, Cranial nerves grossly intact, moves all 4 extremities.  Skin: Warm and dry.   Intake/Output from previous day: 12/07 0701 - 12/08 0700 In: 840 [P.O.:840] Out: 1600 [Urine:1600]    Lab Results: BMET    Component Value Date/Time   NA 138 06/16/2016 0258   NA 138 06/15/2016 0356   NA 140 06/13/2016 1440   K 4.6 06/16/2016 0258   K 4.4 06/15/2016 0356   K 4.2 06/13/2016 1440   CL 102 06/16/2016 0258   CL 103 06/15/2016 0356   CL 105 06/13/2016 1440   CO2 26 06/16/2016 0258   CO2 21 (L) 06/15/2016 0356   CO2 24 06/13/2016 1440   GLUCOSE 123 (H) 06/16/2016 0258   GLUCOSE 115 (H) 06/15/2016 0356   GLUCOSE 126 (H) 06/13/2016 1440   BUN 59 (H) 06/16/2016 0258   BUN 59 (H) 06/15/2016 0356   BUN 56 (H) 06/13/2016 1440   CREATININE 1.90 (H) 06/16/2016 0258   CREATININE 1.92 (H) 06/15/2016 0356   CREATININE  2.12 (H) 06/13/2016 1440   CALCIUM 9.2 06/16/2016 0258   CALCIUM 9.0 06/15/2016 0356   CALCIUM 9.0 06/13/2016 1440   GFRNONAA 27 (L) 06/16/2016 0258   GFRNONAA 26 (L) 06/15/2016 0356   GFRNONAA 23 (L) 06/13/2016 1440   GFRAA 31 (L) 06/16/2016 0258   GFRAA 31 (L) 06/15/2016 0356   GFRAA 27 (L) 06/13/2016 1440   CBC    Component Value Date/Time   WBC 3.9 (L) 06/13/2016 1440   RBC 5.49 (H) 06/13/2016 1440   HGB 14.8 06/13/2016 1440   HCT 45.2 06/13/2016 1440   PLT 216 06/13/2016 1440   MCV 82.3 06/13/2016 1440   MCH 27.0 06/13/2016 1440   MCHC 32.7 06/13/2016 1440   RDW 17.9 (H) 06/13/2016 1440   LYMPHSABS 1.2 06/13/2016 1440   MONOABS 0.4 06/13/2016 1440   EOSABS 0.0 06/13/2016 1440   BASOSABS 0.0 06/13/2016 1440   HEPATIC Function Panel  Recent Labs  07/03/15 1618 03/24/16 1553 06/13/16 1440  PROT 7.0 6.9 6.7   HEMOGLOBIN A1C No components found for: HGA1C,  MPG CARDIAC ENZYMES Lab Results  Component Value Date   CKTOTAL 59 09/07/2012   CKMB 3.2 09/07/2012   TROPONINI 0.03 (HH) 06/13/2016   TROPONINI 0.03 (HH) 06/13/2016   TROPONINI <0.03 07/04/2015   BNP No results for  input(s): PROBNP in the last 8760 hours. TSH No results for input(s): TSH in the last 8760 hours. CHOLESTEROL No results for input(s): CHOL in the last 8760 hours.  Scheduled Meds: . allopurinol  100 mg Oral Daily  . amLODipine  5 mg Oral Daily  . aspirin  81 mg Oral Daily  . carvedilol  3.125 mg Oral BID WC  . cholecalciferol  1,000 Units Oral Q7 weeks  . cycloSPORINE  1 drop Both Eyes Daily  . digoxin  0.0625 mg Oral Daily  . docusate sodium  100 mg Oral Daily  . furosemide  80 mg Intravenous BID  . heparin  5,000 Units Subcutaneous Q8H  . hydroxypropyl methylcellulose / hypromellose  1 drop Both Eyes Daily  . potassium chloride  10 mEq Oral Q M,W,F  . sodium chloride flush  3 mL Intravenous Q12H  . spironolactone  25 mg Oral Daily   Continuous Infusions: PRN Meds:.sodium  chloride, acetaminophen, ondansetron (ZOFRAN) IV, pantoprazole, sodium chloride flush  Assessment/Plan: Acute on chronic bi-ventricular failure CAD S/P LAD stent Hypertension DM, II  Continue diuresis.   LOS: 4 days    Orpah CobbAjay Jamieson Hetland  MD  06/17/2016, 9:18 AM

## 2016-06-18 LAB — BASIC METABOLIC PANEL
ANION GAP: 12 (ref 5–15)
BUN: 55 mg/dL — ABNORMAL HIGH (ref 6–20)
CALCIUM: 9 mg/dL (ref 8.9–10.3)
CO2: 27 mmol/L (ref 22–32)
Chloride: 100 mmol/L — ABNORMAL LOW (ref 101–111)
Creatinine, Ser: 1.79 mg/dL — ABNORMAL HIGH (ref 0.44–1.00)
GFR, EST AFRICAN AMERICAN: 33 mL/min — AB (ref >60)
GFR, EST NON AFRICAN AMERICAN: 29 mL/min — AB (ref >60)
Glucose, Bld: 103 mg/dL — ABNORMAL HIGH (ref 65–99)
Potassium: 3.7 mmol/L (ref 3.5–5.1)
Sodium: 139 mmol/L (ref 135–145)

## 2016-06-18 LAB — BRAIN NATRIURETIC PEPTIDE: B NATRIURETIC PEPTIDE 5: 2414.4 pg/mL — AB (ref 0.0–100.0)

## 2016-06-18 MED ORDER — SACUBITRIL-VALSARTAN 24-26 MG PO TABS
1.0000 | ORAL_TABLET | Freq: Two times a day (BID) | ORAL | Status: DC
  Administered 2016-06-19 – 2016-06-21 (×5): 1 via ORAL
  Filled 2016-06-18 (×8): qty 1

## 2016-06-18 NOTE — Progress Notes (Signed)
Subjective:  Patient denies any chest pain states breathing and leg swelling slowly improving  Objective:  Vital Signs in the last 24 hours: Temp:  [97.4 F (36.3 C)-97.7 F (36.5 C)] 97.7 F (36.5 C) (12/09 0819) Pulse Rate:  [65-85] 67 (12/09 1101) Resp:  [18] 18 (12/09 0518) BP: (147-159)/(80-93) 147/80 (12/09 1101) SpO2:  [98 %-100 %] 98 % (12/09 0819) Weight:  [150 lb 5.7 oz (68.2 kg)] 150 lb 5.7 oz (68.2 kg) (12/09 0518)  Intake/Output from previous day: 12/08 0701 - 12/09 0700 In: 1075 [P.O.:1075] Out: 1475 [Urine:1475] Intake/Output from this shift: Total I/O In: 600 [P.O.:600] Out: 452 [Urine:450; Stool:2]  Physical Exam: Neck: no adenopathy, no carotid bruit and supple, symmetrical, trachea midline Lungs: Decreased breath sound at bases Heart: regular rate and rhythm, S1, S2 normal and 2/6 systolic murmur and S3 gallop noted Abdomen: soft, non-tender; bowel sounds normal; no masses,  no organomegaly Extremities: No clubbing cyanosis 3+ edema noted right leg blisters dressing noted  Lab Results: No results for input(s): WBC, HGB, PLT in the last 72 hours.  Recent Labs  06/16/16 0258 06/18/16 0336  NA 138 139  K 4.6 3.7  CL 102 100*  CO2 26 27  GLUCOSE 123* 103*  BUN 59* 55*  CREATININE 1.90* 1.79*   No results for input(s): TROPONINI in the last 72 hours.  Invalid input(s): CK, MB Hepatic Function Panel No results for input(s): PROT, ALBUMIN, AST, ALT, ALKPHOS, BILITOT, BILIDIR, IBILI in the last 72 hours. No results for input(s): CHOL in the last 72 hours. No results for input(s): PROTIME in the last 72 hours.  Imaging: Imaging results have been reviewed and No results found.  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

## 2016-06-18 NOTE — Progress Notes (Addendum)
Patient refuse to take Sherryll Burgerntresto despite the education provided/given to the pt. Patient will like to talk to her cardiology before taking the med. Dr. Sharyn LullHarwani notified, MD said he will talk to the patient in am. Also patient have 6 beats of v tach/wide QRS this am MD notified. Will continue to monitor the patient.

## 2016-06-19 LAB — BASIC METABOLIC PANEL
Anion gap: 10 (ref 5–15)
BUN: 52 mg/dL — AB (ref 6–20)
CHLORIDE: 100 mmol/L — AB (ref 101–111)
CO2: 27 mmol/L (ref 22–32)
CREATININE: 1.78 mg/dL — AB (ref 0.44–1.00)
Calcium: 8.7 mg/dL — ABNORMAL LOW (ref 8.9–10.3)
GFR calc Af Amer: 34 mL/min — ABNORMAL LOW (ref >60)
GFR, EST NON AFRICAN AMERICAN: 29 mL/min — AB (ref >60)
Glucose, Bld: 165 mg/dL — ABNORMAL HIGH (ref 65–99)
Potassium: 3.5 mmol/L (ref 3.5–5.1)
SODIUM: 137 mmol/L (ref 135–145)

## 2016-06-19 LAB — BRAIN NATRIURETIC PEPTIDE: B Natriuretic Peptide: 2197.6 pg/mL — ABNORMAL HIGH (ref 0.0–100.0)

## 2016-06-19 NOTE — Progress Notes (Signed)
Subjective:  Patient denies any chest pain or shortness of breath states leg swelling slowly improving  Objective:  Vital Signs in the last 24 hours: Temp:  [97.7 F (36.5 C)-98.7 F (37.1 C)] 97.7 F (36.5 C) (12/10 0430) Pulse Rate:  [66-79] 79 (12/10 0430) Resp:  [20] 20 (12/10 0430) BP: (147-167)/(80-98) 158/98 (12/10 0430) SpO2:  [98 %-100 %] 100 % (12/10 0430) Weight:  [150 lb 8 oz (68.3 kg)] 150 lb 8 oz (68.3 kg) (12/10 0430)  Intake/Output from previous day: 12/09 0701 - 12/10 0700 In: 1080 [P.O.:1080] Out: 1752 [Urine:1750; Stool:2] Intake/Output from this shift: Total I/O In: 240 [P.O.:240] Out: -   Physical Exam: Neck: no adenopathy, no carotid bruit, no JVD and supple, symmetrical, trachea midline Lungs: clear to auscultation bilaterally Heart: regular rate and rhythm, S1, S2 normal and 2/6 systolic murmur and S3 gallop noted Abdomen: soft, non-tender; bowel sounds normal; no masses,  no organomegaly Extremities: Clubbing cyanosis 3+ edema noted right leg dressing noted  Lab Results: No results for input(s): WBC, HGB, PLT in the last 72 hours.  Recent Labs  06/18/16 0336 06/19/16 0144  NA 139 137  K 3.7 3.5  CL 100* 100*  CO2 27 27  GLUCOSE 103* 165*  BUN 55* 52*  CREATININE 1.79* 1.78*   No results for input(s): TROPONINI in the last 72 hours.  Invalid input(s): CK, MB Hepatic Function Panel No results for input(s): PROT, ALBUMIN, AST, ALT, ALKPHOS, BILITOT, BILIDIR, IBILI in the last 72 hours. No results for input(s): CHOL in the last 72 hours. No results for input(s): PROTIME in the last 72 hours.  Imaging: Imaging results have been reviewed and No results found.  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  LOS: 6 days    [name], [name] 06/19/2016, 9:11 AM

## 2016-06-20 LAB — BASIC METABOLIC PANEL
Anion gap: 11 (ref 5–15)
BUN: 55 mg/dL — ABNORMAL HIGH (ref 6–20)
CHLORIDE: 101 mmol/L (ref 101–111)
CO2: 26 mmol/L (ref 22–32)
CREATININE: 1.87 mg/dL — AB (ref 0.44–1.00)
Calcium: 8.8 mg/dL — ABNORMAL LOW (ref 8.9–10.3)
GFR calc non Af Amer: 27 mL/min — ABNORMAL LOW (ref >60)
GFR, EST AFRICAN AMERICAN: 32 mL/min — AB (ref >60)
Glucose, Bld: 116 mg/dL — ABNORMAL HIGH (ref 65–99)
Potassium: 3.6 mmol/L (ref 3.5–5.1)
SODIUM: 138 mmol/L (ref 135–145)

## 2016-06-20 MED ORDER — COLLAGENASE 250 UNIT/GM EX OINT
TOPICAL_OINTMENT | Freq: Every day | CUTANEOUS | Status: DC
  Administered 2016-06-20 – 2016-06-21 (×2): via TOPICAL
  Filled 2016-06-20: qty 30

## 2016-06-20 NOTE — Progress Notes (Signed)
Ref: [name],Kevron Patella S, MD   Subjective:  Feeling better. Lost total 9 pounds of weight in 1 week.   Objective:  Vital Signs in the last 24 hours: Temp:  [97.5 F (36.4 C)-97.9 F (36.6 C)] 97.7 F (36.5 C) (12/11 0611) Pulse Rate:  [64-71] 68 (12/11 0611) Cardiac Rhythm: Normal sinus rhythm (12/11 0733) Resp:  [20] 20 (12/11 0611) BP: (133-156)/(62-88) 156/88 (12/11 0611) SpO2:  [99 %-100 %] 100 % (12/11 0611) Weight:  [67.5 kg (148 lb 12.8 oz)] 67.5 kg (148 lb 12.8 oz) (12/11 [id number])  Physical Exam: BP Readings from Last 1 Encounters:  06/20/16 (!) 156/88    Wt Readings from Last 1 Encounters:  06/20/16 67.5 kg (148 lb 12.8 oz)    Weight change: -0.771 kg (-1 lb 11.2 oz) Body mass index is 24.76 kg/m. HEENT: [place]/AT, Eyes-Brown, PERL, EOMI, Conjunctiva-Pink, Sclera-Non-icteric Neck: No JVD, No bruit, Trachea midline. Lungs:  Clearing, Bilateral. Cardiac:  Regular rhythm, normal S1 and S2, no S3. II/VI systolic murmur. Abdomen:  Soft, non-tender. BS present. Extremities:  2 + edema present. No cyanosis. No clubbing. CNS: AxOx3, Cranial nerves grossly intact, moves all 4 extremities.  Skin: Warm and dry.   Intake/Output from previous day: 12/10 0701 - 12/11 0700 In: 960 [P.O.:960] Out: 1500 [Urine:1500]    Lab Results: BMET    Component Value Date/Time   NA 138 06/20/2016 0556   NA 137 06/19/2016 0144   NA 139 06/18/2016 0336   K 3.6 06/20/2016 0556   K 3.5 06/19/2016 0144   K 3.7 06/18/2016 0336   CL 101 06/20/2016 0556   CL 100 (L) 06/19/2016 0144   CL 100 (L) 06/18/2016 0336   CO2 26 06/20/2016 0556   CO2 27 06/19/2016 0144   CO2 27 06/18/2016 0336   GLUCOSE 116 (H) 06/20/2016 0556   GLUCOSE 165 (H) 06/19/2016 0144   GLUCOSE 103 (H) 06/18/2016 0336   BUN 55 (H) 06/20/2016 0556   BUN 52 (H) 06/19/2016 0144   BUN 55 (H) 06/18/2016 0336   CREATININE 1.87 (H) 06/20/2016 0556   CREATININE 1.78 (H) 06/19/2016 0144   CREATININE 1.79 (H) 06/18/2016 0336    CALCIUM 8.8 (L) 06/20/2016 0556   CALCIUM 8.7 (L) 06/19/2016 0144   CALCIUM 9.0 06/18/2016 0336   GFRNONAA 27 (L) 06/20/2016 0556   GFRNONAA 29 (L) 06/19/2016 0144   GFRNONAA 29 (L) 06/18/2016 0336   GFRAA 32 (L) 06/20/2016 0556   GFRAA 34 (L) 06/19/2016 0144   GFRAA 33 (L) 06/18/2016 0336   CBC    Component Value Date/Time   WBC 3.9 (L) 06/13/2016 1440   RBC 5.49 (H) 06/13/2016 1440   HGB 14.8 06/13/2016 1440   HCT 45.2 06/13/2016 1440   PLT 216 06/13/2016 1440   MCV 82.3 06/13/2016 1440   MCH 27.0 06/13/2016 1440   MCHC 32.7 06/13/2016 1440   RDW 17.9 (H) 06/13/2016 1440   LYMPHSABS 1.2 06/13/2016 1440   MONOABS 0.4 06/13/2016 1440   EOSABS 0.0 06/13/2016 1440   BASOSABS 0.0 06/13/2016 1440   HEPATIC Function Panel  Recent Labs  07/03/15 1618 03/24/16 1553 06/13/16 1440  PROT 7.0 6.9 6.7   HEMOGLOBIN A1C No components found for: HGA1C,  MPG CARDIAC ENZYMES Lab Results  Component Value Date   CKTOTAL 59 09/07/2012   CKMB 3.2 09/07/2012   TROPONINI 0.03 (HH) 06/13/2016   TROPONINI 0.03 (HH) 06/13/2016   TROPONINI <0.03 07/04/2015   BNP No results for input(s): PROBNP in the last  8760 hours. TSH No results for input(s): TSH in the last 8760 hours. CHOLESTEROL No results for input(s): CHOL in the last 8760 hours.  Scheduled Meds: . allopurinol  100 mg Oral Daily  . aspirin  81 mg Oral Daily  . carvedilol  3.125 mg Oral BID WC  . cholecalciferol  1,000 Units Oral Q7 weeks  . collagenase   Topical Daily  . cycloSPORINE  1 drop Both Eyes Daily  . digoxin  0.0625 mg Oral Daily  . docusate sodium  100 mg Oral Daily  . furosemide  80 mg Intravenous BID  . heparin  5,000 Units Subcutaneous Q8H  . hydroxypropyl methylcellulose / hypromellose  1 drop Both Eyes Daily  . potassium chloride  10 mEq Oral Q M,W,F  . sacubitril-valsartan  1 tablet Oral BID  . sodium chloride flush  3 mL Intravenous Q12H  . spironolactone  25 mg Oral Daily   Continuous  Infusions: PRN Meds:.sodium chloride, acetaminophen, ondansetron (ZOFRAN) IV, pantoprazole, sodium chloride flush  Assessment/Plan: Acute on chronic Bi-ventricular failure CAD S/P LAD stent Hypertension DM, II CKD, III Right lower leg wound.  Try Santyl cream for right lower leg wound. Increase activity.    LOS: 7 days    Orpah CobbAjay Avyukth Bontempo  MD  06/20/2016, 9:30 AM

## 2016-06-20 NOTE — Consult Note (Signed)
WOC Nurse wound re-consult note Refer to previous WOC consult note on 12/6.  Pt has requested Santyl and the physician ordered it for these sites today, which she has used in the past. Reason for Consult: Requested to re-assess the RLE wounds. The wounds are beefy red and do not appear to need Santyl, but pt insists that she wants to use this topical treatment. Wound type: All the previously noted blisters have ruptured and evolved into patchy areas of full thickness wounds There are several other scattered partial thickness wounds on the anterior calf, all are red and moist Drainage (amount, consistency, odor) Small amt yellow drainage, no odor Dressing procedure/placement/frequency: Discontinue xeroform and begin daily Santyl that has already been ordered.  Reviewed use of compression stockings or wraps to minimize edema, but patient does not prefer to use them "because every compression wrap she has tried hurts."   Please re-consult if further assistance is needed.  Thank-you,  Cammie Mcgeeawn Kary Colaizzi MSN, RN, CWOCN, [place]CN-AP, CNS [phone]

## 2016-06-21 MED ORDER — SACUBITRIL-VALSARTAN 24-26 MG PO TABS: 1.0000 | ORAL_TABLET | Freq: Two times a day (BID) | ORAL | 2 refills | Status: DC

## 2016-06-21 MED ORDER — ALBUTEROL SULFATE HFA 108 (90 BASE) MCG/ACT IN AERS: 2.0000 | INHALATION_SPRAY | Freq: Four times a day (QID) | RESPIRATORY_TRACT | 3 refills | Status: DC | PRN

## 2016-06-21 MED ORDER — COLLAGENASE 250 UNIT/GM EX OINT: TOPICAL_OINTMENT | Freq: Every day | CUTANEOUS | 0 refills | Status: DC

## 2016-06-21 MED ORDER — FUROSEMIDE 80 MG PO TABS: 80.0000 mg | ORAL_TABLET | Freq: Two times a day (BID) | ORAL | 3 refills | Status: DC

## 2016-06-21 NOTE — Discharge Summary (Signed)
Physician Discharge Summary  Patient ID: [name] MRN: [id number] DOB/AGE: [birth year] 65 y.o.  Admit date: 06/13/2016 Discharge date: 06/21/2016  Admission Diagnoses: Acute on chronic left systolic heart failure CAD Hypertension S/P stent in LAD DM, II  Discharge Diagnoses:  Principal Problem:   Acute Biventricular systolic heart failure (HCC) Active Problems:   CAD   S/P stent in LAD   Diabetes mellitus, II (HCC) - diet controlled   Essential hypertension   Cellulitis of right leg   CKD, stage II   Moderate pulmonary hypertension  Discharged Condition: fair  Hospital Course: 65 year old female with known history of Bi-ventricular heart failure had shortness of breath, leg edema and 20 + pounds of weight gain. No fever or cough. She had slow and steady diuresis with IV lasix without significant deterioration of CKD. She will be followed by me in 1 week.  Consults: cardiology  Significant Diagnostic Studies: labs: BNP 2414. BUN/CR-56/2.12. Glucose 126 mg. Troponin-I 0.03  CXR-Cardiomegaly with pulmonary edema.  Echocardiogram: Mild LVH, Severe LV systolic dysfunction with mild diastolic dysfunction. Moderate MR, Severe TR.  Treatments: cardiac meds: Sacubitril-valsartan, carvedilol, amlodipine, digoxin, potassium and Furosemide.  Discharge Exam: Blood pressure 139/70, pulse 79, temperature 98.1 F (36.7 C), temperature source Oral, resp. rate 18, height 5\' 5"  (1.651 m), weight 67.3 kg (148 lb 4.8 oz), SpO2 97 %. General appearance: alert, cooperative and appears stated age. No distress. Head: Normocephalic, atraumatic. Eyes: Brown eyes, pink conjunctivae/corneas clear. PERRL, EOM's intact.  Neck: no adenopathy, no carotid bruit, no JVD, supple, symmetrical, trachea midline and thyroid not enlarged. Resp: clear to auscultation bilaterally. Cardio: Regular rate and rhythm, S1, S2 normal, no murmur, click, rub or gallop GI: soft, non-tender; bowel sounds normal; no  masses,  no organomegaly Extremities: extremities normal, atraumatic, no cyanosis or edema Skin: Warm and dry. No rashes or lesions Neurologic: Alert and oriented X 3, normal strength and tone. Normal coordination and slow gait.  Disposition: 01-Home or Self Care     Medication List    STOP taking these medications   docusate sodium 100 MG capsule Commonly known as:  COLACE     TAKE these medications   acetaminophen 500 MG tablet Commonly known as:  TYLENOL Take 500 mg by mouth every 6 (six) hours as needed for moderate pain or headache.   albuterol 108 (90 Base) MCG/ACT inhaler Commonly known as:  PROVENTIL HFA;VENTOLIN HFA Inhale 2 puffs into the lungs every 6 (six) hours as needed for wheezing or shortness of breath.   allopurinol 100 MG tablet Commonly known as:  ZYLOPRIM Take 1 tablet (100 mg total) by mouth daily. What changed:  when to take this   amLODipine 5 MG tablet Commonly known as:  NORVASC Take 1 tablet (5 mg total) by mouth daily.   aspirin 81 MG chewable tablet Chew 81 mg by mouth daily.   carvedilol 3.125 MG tablet Commonly known as:  COREG Take 1 tablet (3.125 mg total) by mouth 2 (two) times daily with a meal. What changed:  when to take this   collagenase ointment Commonly known as:  SANTYL Apply topically daily. Start taking on:  06/22/2016   cycloSPORINE 0.05 % ophthalmic emulsion Commonly known as:  RESTASIS Place 1 drop into both eyes daily.   digoxin 0.125 MG tablet Commonly known as:  LANOXIN Take 0.5 tablets (0.0625 mg total) by mouth daily.   furosemide 80 MG tablet Commonly known as:  LASIX Take 1 tablet (80 mg total) by  mouth 2 (two) times daily. What changed:  how much to take   metolazone 5 MG tablet Commonly known as:  ZAROXOLYN Take 1 tablet (5 mg total) by mouth every Monday, Wednesday, and Friday.   nitroGLYCERIN 0.4 MG SL tablet Commonly known as:  NITROSTAT Place 0.4 mg under the tongue every 5 (five) minutes as  needed for chest pain.   omeprazole 20 MG tablet Commonly known as:  PRILOSEC OTC Take 20 mg by mouth daily as needed (for acid reflux).   PAZEO 0.7 % Soln Generic drug:  Olopatadine HCl Place 1 drop into both eyes daily.   potassium chloride 10 MEQ tablet Commonly known as:  K-DUR,KLOR-CON Take 1 tablet (10 mEq total) by mouth every Monday, Wednesday, and Friday. What changed:  when to take this   sacubitril-valsartan 24-26 MG Commonly known as:  ENTRESTO Take 1 tablet by mouth 2 (two) times daily.   spironolactone 25 MG tablet Commonly known as:  ALDACTONE Take 25 mg by mouth daily.   SYSTANE ULTRA 0.4-0.3 % Soln Generic drug:  Polyethyl Glycol-Propyl Glycol Place 1 drop into both eyes daily.   Vitamin D3 1000 units Caps Take 1,000 Units by mouth daily.      Follow-up Information    [facility],[name], MD. Schedule an appointment as soon as possible for a visit in 1 week(s).   Specialty:  Cardiology Contact information: [address] [place] [address] [phone]           Signed: [name],Hunner Garcon S 06/21/2016, 2:24 PM

## 2016-06-21 NOTE — Progress Notes (Signed)
Patient given discharge instructions and all questions answered.  Pt. Given entresto card as is new medication.  Pt. Discharged via wheelchair with all belongings.

## 2016-06-21 NOTE — Progress Notes (Signed)
Patient alarmed 5 beats of v-tach and asymptomatic in chair.  VSS. Will continue to monitor.

## 2016-06-22 LAB — HEMOGLOBIN A1C
Hgb A1c MFr Bld: 7 % — ABNORMAL HIGH (ref 4.8–5.6)
Mean Plasma Glucose: 154 mg/dL

## 2016-07-12 IMAGING — CR DG CHEST 2V
2 series · 2 of 2 positions shown · non-contrast
Comparison: 05/26/2015.

CLINICAL DATA: Fluid gain, worsening shortness of breath,
productive cough for 2 weeks. Bilateral flank pain.

EXAM:
CHEST  2 VIEW

[chest pa]
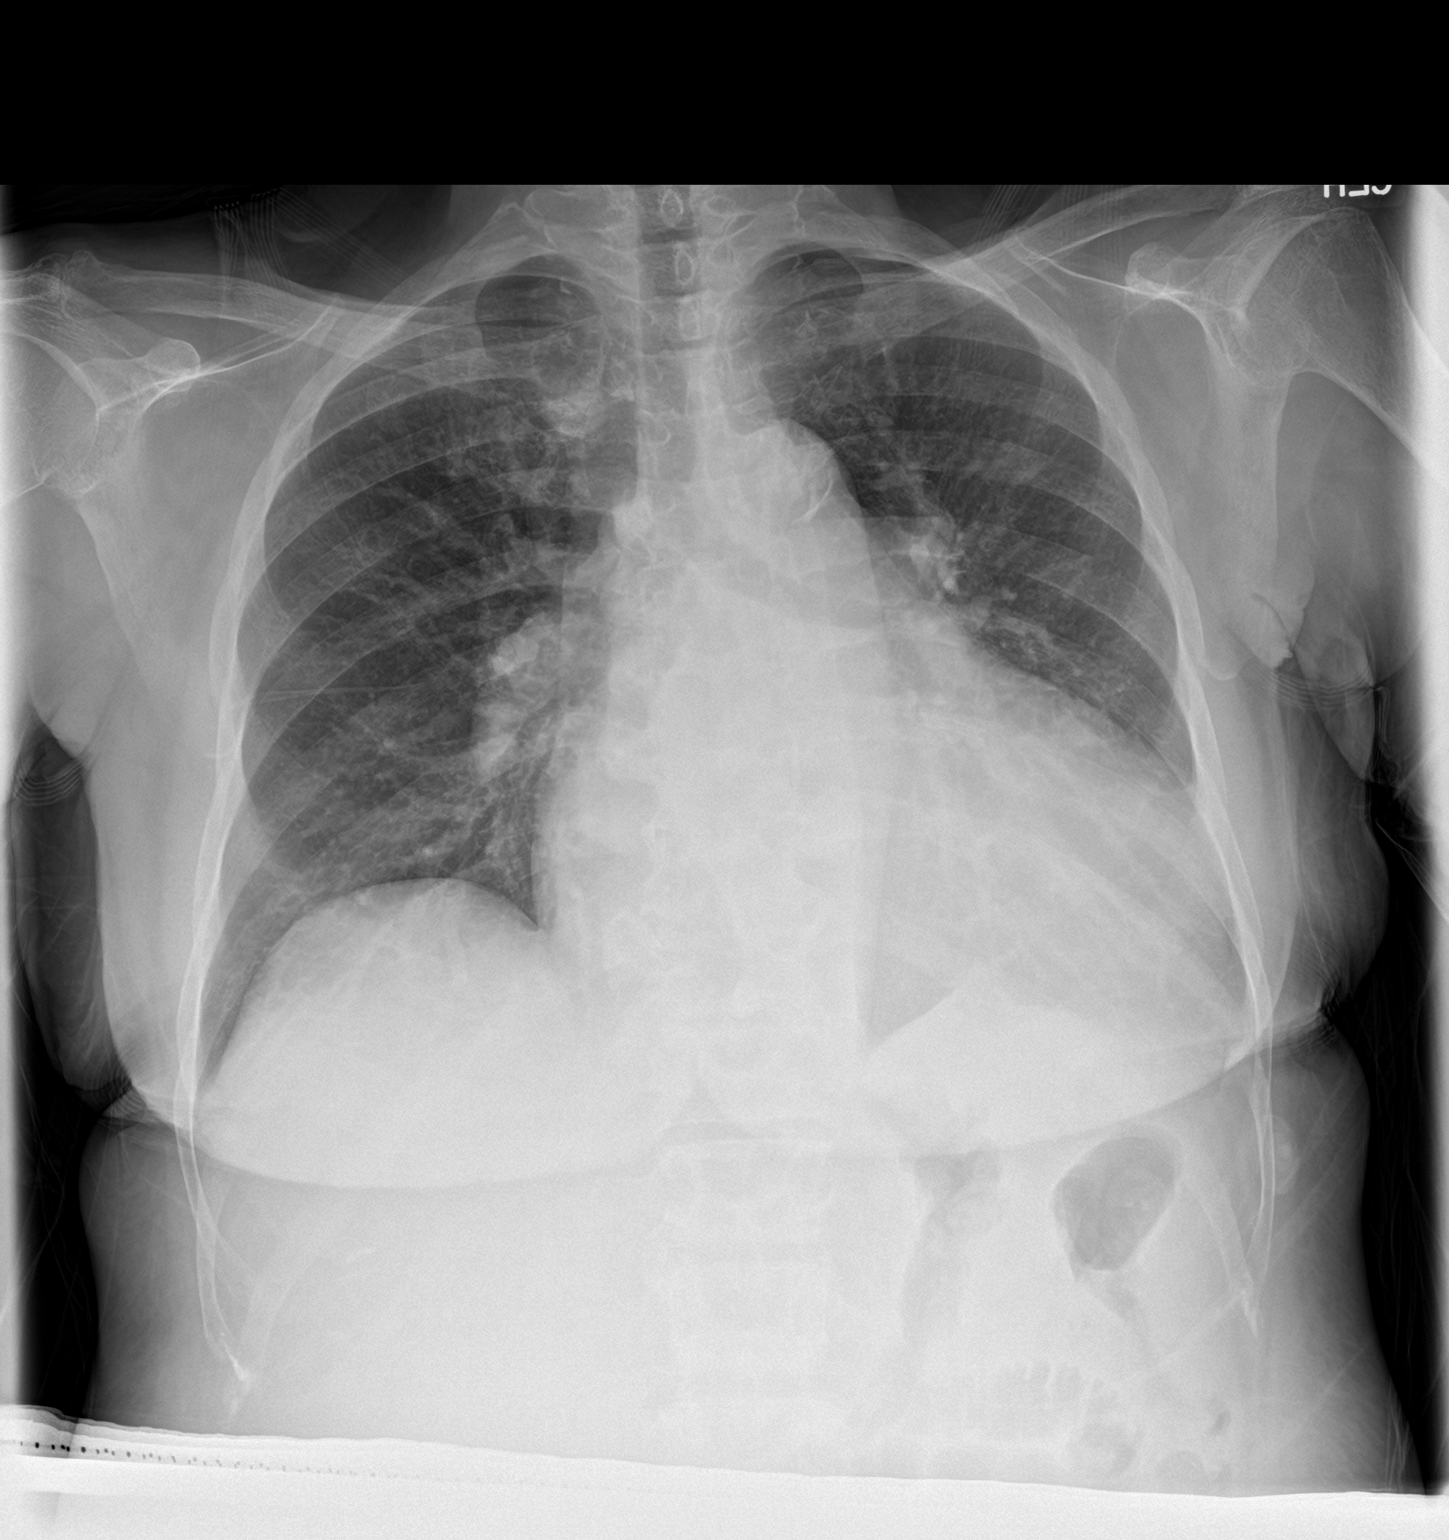

[chest lat]
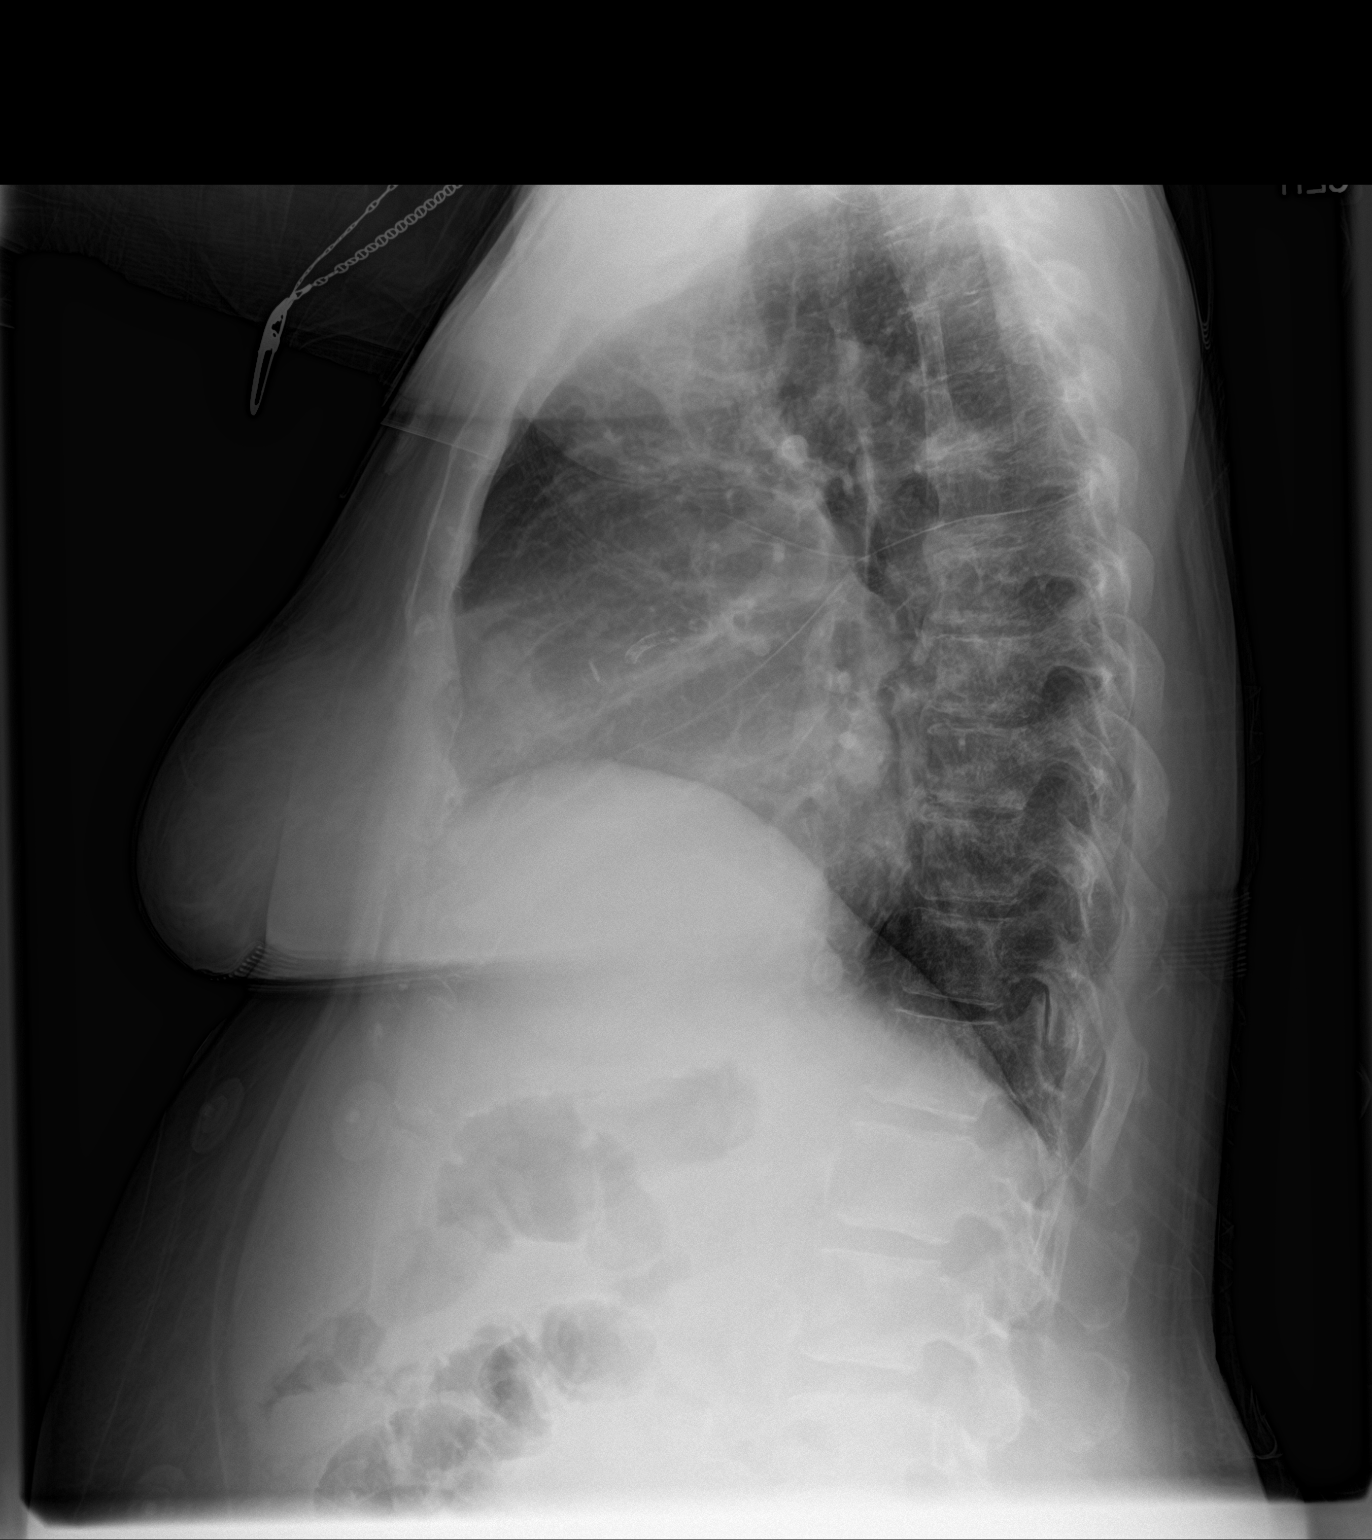

[2 of 2 positions shown; findings below may reference images not displayed]

FINDINGS: Trachea is midline. Cardiac silhouette is enlarged, stable. Thoracic
aorta is calcified. No definite edema. No airspace consolidation or
pleural fluid. Suspect mild pleural parenchymal scarring at the base
of the left hemi thorax.
IMPRESSION: No acute findings.

## 2016-07-25 ENCOUNTER — Inpatient Hospital Stay (HOSPITAL_COMMUNITY)
Admission: AD | Admit: 2016-07-25 | Discharge: 2016-08-03 | DRG: 291 | Disposition: A | Discharge status: Home Health | Payer: Medicaid Other | Source: Ambulatory Visit | Attending: Cardiovascular Disease | Admitting: Cardiovascular Disease

## 2016-07-25 ENCOUNTER — Inpatient Hospital Stay (HOSPITAL_COMMUNITY): Payer: Medicaid Other

## 2016-07-25 ENCOUNTER — Encounter (HOSPITAL_COMMUNITY): Payer: Self-pay | Admitting: General Practice

## 2016-07-25 DIAGNOSIS — Z87891 Personal history of nicotine dependence: Secondary | ICD-10-CM

## 2016-07-25 DIAGNOSIS — L03115 Cellulitis of right lower limb: Secondary | ICD-10-CM | POA: Diagnosis present

## 2016-07-25 DIAGNOSIS — Z955 Presence of coronary angioplasty implant and graft: Secondary | ICD-10-CM | POA: Diagnosis not present

## 2016-07-25 DIAGNOSIS — I5023 Acute on chronic systolic (congestive) heart failure: Secondary | ICD-10-CM | POA: Diagnosis present

## 2016-07-25 DIAGNOSIS — L97209 Non-pressure chronic ulcer of unspecified calf with unspecified severity: Secondary | ICD-10-CM | POA: Diagnosis present

## 2016-07-25 DIAGNOSIS — E7800 Pure hypercholesterolemia, unspecified: Secondary | ICD-10-CM | POA: Diagnosis present

## 2016-07-25 DIAGNOSIS — I130 Hypertensive heart and chronic kidney disease with heart failure and stage 1 through stage 4 chronic kidney disease, or unspecified chronic kidney disease: Secondary | ICD-10-CM | POA: Diagnosis present

## 2016-07-25 DIAGNOSIS — I2510 Atherosclerotic heart disease of native coronary artery without angina pectoris: Secondary | ICD-10-CM | POA: Diagnosis present

## 2016-07-25 DIAGNOSIS — E440 Moderate protein-calorie malnutrition: Secondary | ICD-10-CM | POA: Diagnosis present

## 2016-07-25 DIAGNOSIS — Z6825 Body mass index (BMI) 25.0-25.9, adult: Secondary | ICD-10-CM

## 2016-07-25 DIAGNOSIS — I5082 Biventricular heart failure: Secondary | ICD-10-CM | POA: Diagnosis present

## 2016-07-25 DIAGNOSIS — E11622 Type 2 diabetes mellitus with other skin ulcer: Secondary | ICD-10-CM | POA: Diagnosis present

## 2016-07-25 DIAGNOSIS — N183 Chronic kidney disease, stage 3 (moderate): Secondary | ICD-10-CM | POA: Diagnosis present

## 2016-07-25 DIAGNOSIS — K219 Gastro-esophageal reflux disease without esophagitis: Secondary | ICD-10-CM | POA: Diagnosis present

## 2016-07-25 DIAGNOSIS — E1122 Type 2 diabetes mellitus with diabetic chronic kidney disease: Secondary | ICD-10-CM | POA: Diagnosis present

## 2016-07-25 DIAGNOSIS — Z79899 Other long term (current) drug therapy: Secondary | ICD-10-CM

## 2016-07-25 DIAGNOSIS — M109 Gout, unspecified: Secondary | ICD-10-CM | POA: Diagnosis present

## 2016-07-25 DIAGNOSIS — Z7982 Long term (current) use of aspirin: Secondary | ICD-10-CM

## 2016-07-25 DIAGNOSIS — I10 Essential (primary) hypertension: Secondary | ICD-10-CM | POA: Diagnosis present

## 2016-07-25 DIAGNOSIS — I509 Heart failure, unspecified: Secondary | ICD-10-CM

## 2016-07-25 DIAGNOSIS — E1129 Type 2 diabetes mellitus with other diabetic kidney complication: Secondary | ICD-10-CM

## 2016-07-25 HISTORY — DX: Biventricular heart failure: I50.82

## 2016-07-25 HISTORY — DX: Gastric ulcer, unspecified as acute or chronic, without hemorrhage or perforation: K25.9

## 2016-07-25 LAB — COMPREHENSIVE METABOLIC PANEL
ALT: 12 U/L — ABNORMAL LOW (ref 14–54)
ANION GAP: 11 (ref 5–15)
AST: 19 U/L (ref 15–41)
Albumin: 2.5 g/dL — ABNORMAL LOW (ref 3.5–5.0)
Alkaline Phosphatase: 75 U/L (ref 38–126)
BUN: 52 mg/dL — ABNORMAL HIGH (ref 6–20)
CHLORIDE: 105 mmol/L (ref 101–111)
CO2: 19 mmol/L — ABNORMAL LOW (ref 22–32)
Calcium: 8.5 mg/dL — ABNORMAL LOW (ref 8.9–10.3)
Creatinine, Ser: 1.93 mg/dL — ABNORMAL HIGH (ref 0.44–1.00)
GFR, EST AFRICAN AMERICAN: 30 mL/min — AB (ref >60)
GFR, EST NON AFRICAN AMERICAN: 26 mL/min — AB (ref >60)
Glucose, Bld: 85 mg/dL (ref 65–99)
POTASSIUM: 4.5 mmol/L (ref 3.5–5.1)
Sodium: 135 mmol/L (ref 135–145)
TOTAL PROTEIN: 6.5 g/dL (ref 6.5–8.1)
Total Bilirubin: 2.6 mg/dL — ABNORMAL HIGH (ref 0.3–1.2)

## 2016-07-25 LAB — CBC WITH DIFFERENTIAL/PLATELET
BASOS ABS: 0 10*3/uL (ref 0.0–0.1)
Basophils Relative: 0 %
EOS PCT: 1 %
Eosinophils Absolute: 0 10*3/uL (ref 0.0–0.7)
HCT: 43.9 % (ref 36.0–46.0)
Hemoglobin: 14.7 g/dL (ref 12.0–15.0)
LYMPHS PCT: 29 %
Lymphs Abs: 1.1 10*3/uL (ref 0.7–4.0)
MCH: 26.6 pg (ref 26.0–34.0)
MCHC: 33.5 g/dL (ref 30.0–36.0)
MCV: 79.4 fL (ref 78.0–100.0)
MONO ABS: 0.3 10*3/uL (ref 0.1–1.0)
MONOS PCT: 9 %
Neutro Abs: 2.3 10*3/uL (ref 1.7–7.7)
Neutrophils Relative %: 61 %
PLATELETS: 187 10*3/uL (ref 150–400)
RBC: 5.53 MIL/uL — ABNORMAL HIGH (ref 3.87–5.11)
RDW: 16.8 % — AB (ref 11.5–15.5)
WBC: 3.7 10*3/uL — ABNORMAL LOW (ref 4.0–10.5)

## 2016-07-25 LAB — GLUCOSE, CAPILLARY
GLUCOSE-CAPILLARY: 72 mg/dL (ref 65–99)
Glucose-Capillary: 133 mg/dL — ABNORMAL HIGH (ref 65–99)

## 2016-07-25 LAB — TROPONIN I

## 2016-07-25 LAB — BRAIN NATRIURETIC PEPTIDE: B NATRIURETIC PEPTIDE 5: 2602.9 pg/mL — AB (ref 0.0–100.0)

## 2016-07-25 MED ORDER — SODIUM CHLORIDE 0.9 % IV SOLN: 250.0000 mL | INTRAVENOUS | Status: DC | PRN

## 2016-07-25 MED ORDER — FUROSEMIDE 10 MG/ML IJ SOLN
60.0000 mg | Freq: Two times a day (BID) | INTRAMUSCULAR | Status: DC
  Administered 2016-07-25 – 2016-07-26 (×2): 60 mg via INTRAVENOUS
  Filled 2016-07-25 (×2): qty 6

## 2016-07-25 MED ORDER — POLYVINYL ALCOHOL 1.4 % OP SOLN
1.0000 [drp] | Freq: Every day | OPHTHALMIC | Status: DC
  Administered 2016-07-25 – 2016-08-03 (×7): 1 [drp] via OPHTHALMIC
  Filled 2016-07-25: qty 15

## 2016-07-25 MED ORDER — HEPARIN SODIUM (PORCINE) 5000 UNIT/ML IJ SOLN
5000.0000 [IU] | Freq: Three times a day (TID) | INTRAMUSCULAR | Status: DC
  Filled 2016-07-25 (×3): qty 1

## 2016-07-25 MED ORDER — ONDANSETRON HCL 4 MG/2ML IJ SOLN: 4.0000 mg | Freq: Four times a day (QID) | INTRAMUSCULAR | Status: DC | PRN

## 2016-07-25 MED ORDER — SPIRONOLACTONE 25 MG PO TABS
25.0000 mg | ORAL_TABLET | Freq: Every day | ORAL | Status: DC
  Administered 2016-07-26 – 2016-08-03 (×9): 25 mg via ORAL
  Filled 2016-07-25 (×9): qty 1

## 2016-07-25 MED ORDER — CARVEDILOL 3.125 MG PO TABS
3.1250 mg | ORAL_TABLET | Freq: Two times a day (BID) | ORAL | Status: DC
  Administered 2016-07-25 – 2016-08-03 (×18): 3.125 mg via ORAL
  Filled 2016-07-25 (×18): qty 1

## 2016-07-25 MED ORDER — CYCLOSPORINE 0.05 % OP EMUL
1.0000 [drp] | Freq: Every day | OPHTHALMIC | Status: DC
  Administered 2016-07-26 – 2016-08-03 (×9): 1 [drp] via OPHTHALMIC
  Filled 2016-07-25 (×9): qty 1

## 2016-07-25 MED ORDER — ALLOPURINOL 100 MG PO TABS
100.0000 mg | ORAL_TABLET | Freq: Every day | ORAL | Status: DC
  Administered 2016-07-26 – 2016-08-03 (×9): 100 mg via ORAL
  Filled 2016-07-25 (×9): qty 1

## 2016-07-25 MED ORDER — ACETAMINOPHEN 325 MG PO TABS
650.0000 mg | ORAL_TABLET | ORAL | Status: DC | PRN
  Administered 2016-07-27 – 2016-08-02 (×5): 650 mg via ORAL
  Filled 2016-07-25 (×5): qty 2

## 2016-07-25 MED ORDER — ALBUTEROL SULFATE (2.5 MG/3ML) 0.083% IN NEBU: 2.5000 mg | INHALATION_SOLUTION | Freq: Four times a day (QID) | RESPIRATORY_TRACT | Status: DC | PRN

## 2016-07-25 MED ORDER — OMEPRAZOLE 20 MG PO CPDR
20.0000 mg | DELAYED_RELEASE_CAPSULE | Freq: Every day | ORAL | Status: DC | PRN
  Filled 2016-07-25: qty 1

## 2016-07-25 MED ORDER — SODIUM CHLORIDE 0.9% FLUSH
3.0000 mL | Freq: Two times a day (BID) | INTRAVENOUS | Status: DC
  Administered 2016-07-25 – 2016-08-03 (×15): 3 mL via INTRAVENOUS

## 2016-07-25 MED ORDER — SODIUM CHLORIDE 0.9% FLUSH
3.0000 mL | INTRAVENOUS | Status: DC | PRN
  Administered 2016-07-26: 3 mL via INTRAVENOUS
  Filled 2016-07-25: qty 3

## 2016-07-25 MED ORDER — METOLAZONE 2.5 MG PO TABS
2.5000 mg | ORAL_TABLET | Freq: Every day | ORAL | Status: DC
  Administered 2016-07-25 – 2016-07-28 (×4): 2.5 mg via ORAL
  Filled 2016-07-25 (×4): qty 1

## 2016-07-25 MED ORDER — COLLAGENASE 250 UNIT/GM EX OINT
TOPICAL_OINTMENT | Freq: Every day | CUTANEOUS | Status: DC
  Administered 2016-07-26 – 2016-08-03 (×6): via TOPICAL
  Filled 2016-07-25: qty 30

## 2016-07-25 MED ORDER — VITAMIN D 1000 UNITS PO TABS
1000.0000 [IU] | ORAL_TABLET | Freq: Every day | ORAL | Status: DC
  Administered 2016-07-26 – 2016-08-03 (×9): 1000 [IU] via ORAL
  Filled 2016-07-25 (×9): qty 1

## 2016-07-25 MED ORDER — SACUBITRIL-VALSARTAN 24-26 MG PO TABS
1.0000 | ORAL_TABLET | Freq: Two times a day (BID) | ORAL | Status: DC
  Administered 2016-07-25 – 2016-08-03 (×18): 1 via ORAL
  Filled 2016-07-25 (×18): qty 1

## 2016-07-25 MED ORDER — OLOPATADINE HCL 0.1 % OP SOLN
1.0000 [drp] | Freq: Every day | OPHTHALMIC | Status: DC
  Administered 2016-07-25 – 2016-08-03 (×10): 1 [drp] via OPHTHALMIC
  Filled 2016-07-25 (×2): qty 5

## 2016-07-25 MED ORDER — AMLODIPINE BESYLATE 5 MG PO TABS
5.0000 mg | ORAL_TABLET | Freq: Every day | ORAL | Status: DC
  Administered 2016-07-25 – 2016-07-29 (×5): 5 mg via ORAL
  Filled 2016-07-25 (×5): qty 1

## 2016-07-25 MED ORDER — ENSURE ENLIVE PO LIQD: 237.0000 mL | Freq: Two times a day (BID) | ORAL | Status: DC

## 2016-07-25 MED ORDER — DIGOXIN 125 MCG PO TABS
0.0625 mg | ORAL_TABLET | Freq: Every day | ORAL | Status: DC
  Administered 2016-07-26 – 2016-08-03 (×9): 0.0625 mg via ORAL
  Filled 2016-07-25 (×9): qty 1

## 2016-07-25 NOTE — H&P (Signed)
Referring Physician: [name], MD   [name] is an 66 y.o. female.                       Chief Complaint: Shortness of breath and leg edema.  HPI: 66 yrs old female with known history of Bi-Ventricular systolic heart failure has increasing leg edema and shortness of breath. Patient denies dietary non-compliance. She also has right lower leg healing wounds. No fever or chest pain or cough.  Past Medical History:  Diagnosis Date  . CHF (congestive heart failure) (HCC)   . Coronary artery disease   . GERD (gastroesophageal reflux disease)   . Gout   . High cholesterol   . Hypertension   . Shortness of breath dyspnea   . Type II diabetes mellitus (HCC)       Past Surgical History:  Procedure Laterality Date  . CARDIAC CATHETERIZATION  2014  . CORONARY ANGIOPLASTY WITH STENT PLACEMENT  2013  . LEFT AND RIGHT HEART CATHETERIZATION WITH CORONARY ANGIOGRAM N/A 11/25/2011   Procedure: LEFT AND RIGHT HEART CATHETERIZATION WITH CORONARY ANGIOGRAM;  Surgeon: Robynn Pane, MD;  Location: [facility] CATH LAB;  Service: Cardiovascular;  Laterality: N/A;  . LEFT AND RIGHT HEART CATHETERIZATION WITH CORONARY ANGIOGRAM N/A 09/07/2012   Procedure: LEFT AND RIGHT HEART CATHETERIZATION WITH CORONARY ANGIOGRAM;  Surgeon: [name], MD;  Location: [facility] CATH LAB;  Service: Cardiovascular;  Laterality: N/A;  . PERCUTANEOUS CORONARY STENT INTERVENTION (PCI-S) Right 11/25/2011   Procedure: PERCUTANEOUS CORONARY STENT INTERVENTION (PCI-S);  Surgeon: Robynn Pane, MD;  Location: [facility] CATH LAB;  Service: Cardiovascular;  Laterality: Right;  . TONSILLECTOMY  1950's    No family history on file. Social History:  reports that she has quit smoking. Her smoking use included Cigarettes. She has a 10.00 pack-year smoking history. She has never used smokeless tobacco. She reports that she does not drink alcohol or use drugs.  Allergies:  Allergies  Allergen Reactions  . Bidil [Isosorb Dinitrate-Hydralazine]  Itching and Other (See Comments)    Jittery, feels like something is crawling    Medications Prior to Admission  Medication Sig Dispense Refill  . acetaminophen (TYLENOL) 500 MG tablet Take 500 mg by mouth every 6 (six) hours as needed for moderate pain or headache.    . allopurinol (ZYLOPRIM) 100 MG tablet Take 1 tablet (100 mg total) by mouth daily. (Patient taking differently: Take 100 mg by mouth 2 (two) times daily. )    . amLODipine (NORVASC) 5 MG tablet Take 1 tablet (5 mg total) by mouth daily. 30 tablet 1  . aspirin 81 MG chewable tablet Chew 81 mg by mouth daily.    . carvedilol (COREG) 3.125 MG tablet Take 1 tablet (3.125 mg total) by mouth 2 (two) times daily with a meal. 60 tablet 3  . Cholecalciferol (VITAMIN D3) 2000 units CHEW Chew 2,000 Units by mouth daily.     . colchicine 0.6 MG tablet Take 0.6 mg by mouth daily.    . cycloSPORINE (RESTASIS) 0.05 % ophthalmic emulsion Place 1 drop into both eyes daily.    . furosemide (LASIX) 80 MG tablet Take 1 tablet (80 mg total) by mouth 2 (two) times daily. 60 tablet 3  . metolazone (ZAROXOLYN) 5 MG tablet Take 1 tablet (5 mg total) by mouth every Monday, Wednesday, and Friday. 15 tablet 3  . nitroGLYCERIN (NITROSTAT) 0.4 MG SL tablet Place 0.4 mg under the tongue every 5 (five) minutes as  needed for chest pain.    Marland Kitchen. Olopatadine HCl (PAZEO) 0.7 % SOLN Place 1 drop into both eyes daily.     Marland Kitchen. omeprazole (PRILOSEC OTC) 20 MG tablet Take 20 mg by mouth daily as needed (for acid reflux).    Bertram Gala. Polyethyl Glycol-Propyl Glycol (SYSTANE ULTRA) 0.4-0.3 % SOLN Place 1 drop into both eyes daily.    . potassium chloride (K-DUR,KLOR-CON) 10 MEQ tablet Take 1 tablet (10 mEq total) by mouth every Monday, Wednesday, and Friday. (Patient taking differently: Take 10 mEq by mouth daily. )    . sacubitril-valsartan (ENTRESTO) 24-26 MG Take 1 tablet by mouth 2 (two) times daily. 60 tablet 2  . spironolactone (ALDACTONE) 25 MG tablet Take 25 mg by mouth  daily.    Marland Kitchen. albuterol (PROVENTIL HFA;VENTOLIN HFA) 108 (90 Base) MCG/ACT inhaler Inhale 2 puffs into the lungs every 6 (six) hours as needed for wheezing or shortness of breath. (Patient not taking: Reported on 07/25/2016) 1 Inhaler 3  . collagenase (SANTYL) ointment Apply topically daily. (Patient not taking: Reported on 07/25/2016) 15 g 0  . digoxin (LANOXIN) 0.125 MG tablet Take 0.5 tablets (0.0625 mg total) by mouth daily. (Patient not taking: Reported on 07/25/2016) 15 tablet 1    No results found for this or any previous visit (from the past 48 hour(s)). No results found.  Review Of Systems Constitutional: No fever, chills , Positive weight gain. Eyes: Vision change, Wears glasses. No discharge or pain.. Ears: No hearing loss, No tinnitus. Respiratory: No asthma, Positive COPD, pneumonias, shortness of breath. No hemoptysis. Cardiovascular: No chest pain, palpitation. Positive leg edema. Gastrointestinal: No nausea, vomiting or diarrhea. Positive constipation. No GI bleed. No hepatitis. Genitourinary: No dysuria, hematuria or kidney stone. No incontinance. Neurological: No headache, stroke or seizures.  Psychiatry: No psych facility admission for anxiety, depression or suicide. No detox. Skin: No rash. Positive chronic wound over right lower leg. Musculoskeletal: No joint pain or fibromyalgia. Positive neck pain or back pain. Lymphadenopathy: No lymphadenopathy Hematology: No anemia or easy bruising.   Blood pressure (!) 151/88, pulse 74, resp. rate 18, height 5\' 5"  (1.651 m), weight 70.5 kg (155 lb 8 oz), SpO2 99 %. Body mass index is 25.88 kg/m. General appearance: alert, cooperative, appears stated age and mild distress Head: Normocephalic, atraumatic. Eyes: Brown eyes, conjunctivae/corneas clear. PERRL, EOM's intact. Fundi benign. Neck: no adenopathy, no carotid bruit, ++ JVD, supple, symmetrical, trachea midline and thyroid not enlarged. Resp: Basal crackles to auscultation  bilaterally Cardio: regular rate and rhythm, S1, S2 normal, II/VI systolic murmur, no click, rub or gallop GI: soft, non-tender; bowel sounds normal; no masses,  no organomegaly Extremities:2 + edema of both legs up to thighs.  Skin: Warm and dry. 2 inch wound over right lower leg near ankle and 1/2 inch wound over right lower leg near knee.  Neurologic: Alert and oriented X 3, normal strength and tone. Normal coordination and slow gait with cane use.  Assessment/Plan Acute on chronic biventricular systolic heart failure Hypertension CAD S/P Stent in LAD DM, II  Admit. IV lasix. Consider milrinone or dopamine drip.   [name],Shani Fitch S, MD  07/25/2016, 4:33 PM

## 2016-07-26 LAB — BASIC METABOLIC PANEL
Anion gap: 11 (ref 5–15)
BUN: 53 mg/dL — AB (ref 6–20)
CHLORIDE: 102 mmol/L (ref 101–111)
CO2: 21 mmol/L — AB (ref 22–32)
CREATININE: 2.06 mg/dL — AB (ref 0.44–1.00)
Calcium: 8.5 mg/dL — ABNORMAL LOW (ref 8.9–10.3)
GFR calc Af Amer: 28 mL/min — ABNORMAL LOW (ref >60)
GFR calc non Af Amer: 24 mL/min — ABNORMAL LOW (ref >60)
GLUCOSE: 116 mg/dL — AB (ref 65–99)
Potassium: 4.5 mmol/L (ref 3.5–5.1)
SODIUM: 134 mmol/L — AB (ref 135–145)

## 2016-07-26 LAB — GLUCOSE, CAPILLARY
Glucose-Capillary: 104 mg/dL — ABNORMAL HIGH (ref 65–99)
Glucose-Capillary: 186 mg/dL — ABNORMAL HIGH (ref 65–99)

## 2016-07-26 LAB — TROPONIN I: Troponin I: <0.03 ng/mL (ref <0.03)

## 2016-07-26 MED ORDER — GLUCERNA SHAKE PO LIQD
237.0000 mL | Freq: Two times a day (BID) | ORAL | Status: DC
  Administered 2016-07-28: 237 mL via ORAL

## 2016-07-26 MED ORDER — ADULT MULTIVITAMIN W/MINERALS CH
1.0000 | ORAL_TABLET | Freq: Every day | ORAL | Status: DC
  Administered 2016-07-26 – 2016-08-03 (×9): 1 via ORAL
  Filled 2016-07-26 (×11): qty 1

## 2016-07-26 MED ORDER — FUROSEMIDE 10 MG/ML IJ SOLN
60.0000 mg | Freq: Two times a day (BID) | INTRAMUSCULAR | Status: DC
  Administered 2016-07-26 – 2016-07-28 (×5): 60 mg via INTRAVENOUS
  Filled 2016-07-26 (×5): qty 6

## 2016-07-26 MED ORDER — PRO-STAT SUGAR FREE PO LIQD
30.0000 mL | Freq: Every day | ORAL | Status: DC
  Administered 2016-07-26 – 2016-07-28 (×3): 30 mL via ORAL
  Filled 2016-07-26 (×5): qty 30

## 2016-07-26 MED ORDER — MAGNESIUM SULFATE IN D5W 1-5 GM/100ML-% IV SOLN
1.0000 g | Freq: Once | INTRAVENOUS | Status: AC
  Administered 2016-07-26: 1 g via INTRAVENOUS
  Filled 2016-07-26: qty 100

## 2016-07-26 NOTE — Progress Notes (Signed)
Ref: [name], MD   Subjective:  Feeling little better. Legs are tight.  Objective:  Vital Signs in the last 24 hours: Temp:  [97.7 F (36.5 C)] 97.7 F (36.5 C) (01/16 0521) Pulse Rate:  [62-78] 62 (01/16 0521) Cardiac Rhythm: Normal sinus rhythm (01/15 2000) Resp:  [18] 18 (01/16 0521) BP: (116-151)/(69-88) 116/69 (01/16 0521) SpO2:  [97 %-99 %] 98 % (01/16 0521) Weight:  [70.5 kg (155 lb 8 oz)-74.1 kg (163 lb 4.8 oz)] 74.1 kg (163 lb 4.8 oz) (01/16 0521)  Physical Exam: BP Readings from Last 1 Encounters:  07/26/16 116/69    Wt Readings from Last 1 Encounters:  07/26/16 74.1 kg (163 lb 4.8 oz)    Weight change:  Body mass index is 27.17 kg/m. HEENT: [place]/AT, Eyes-Brown, PERL, EOMI, Conjunctiva-Pink, Sclera-Non-icteric Neck: ++ JVD, No bruit, Trachea midline. Lungs:  Crackles at bases, Bilateral. Cardiac:  Regular rhythm, normal S1 and S2, no S3. II/VI systolic murmur. Abdomen:  Soft, non-tender. BS present. Extremities:  2 + edema up to thighs and presacral edema also present. No cyanosis. No clubbing. Dressing applied over right lower leg wound measuring 2" x 2.5 inch with off white base. CNS: AxOx3, Cranial nerves grossly intact, moves all 4 extremities.  Skin: Warm and dry.   Intake/Output from previous day: 01/15 0701 - 01/16 0700 In: 120 [P.O.:120] Out: 200 [Urine:200]    Lab Results: BMET    Component Value Date/Time   NA 134 (L) 07/26/2016 0145   NA 135 07/25/2016 1701   NA 138 06/20/2016 0556   K 4.5 07/26/2016 0145   K 4.5 07/25/2016 1701   K 3.6 06/20/2016 0556   CL 102 07/26/2016 0145   CL 105 07/25/2016 1701   CL 101 06/20/2016 0556   CO2 21 (L) 07/26/2016 0145   CO2 19 (L) 07/25/2016 1701   CO2 26 06/20/2016 0556   GLUCOSE 116 (H) 07/26/2016 0145   GLUCOSE 85 07/25/2016 1701   GLUCOSE 116 (H) 06/20/2016 0556   BUN 53 (H) 07/26/2016 0145   BUN 52 (H) 07/25/2016 1701   BUN 55 (H) 06/20/2016 0556   CREATININE 2.06 (H) 07/26/2016 0145   CREATININE 1.93 (H) 07/25/2016 1701   CREATININE 1.87 (H) 06/20/2016 0556   CALCIUM 8.5 (L) 07/26/2016 0145   CALCIUM 8.5 (L) 07/25/2016 1701   CALCIUM 8.8 (L) 06/20/2016 0556   GFRNONAA 24 (L) 07/26/2016 0145   GFRNONAA 26 (L) 07/25/2016 1701   GFRNONAA 27 (L) 06/20/2016 0556   GFRAA 28 (L) 07/26/2016 0145   GFRAA 30 (L) 07/25/2016 1701   GFRAA 32 (L) 06/20/2016 0556   CBC    Component Value Date/Time   WBC 3.7 (L) 07/25/2016 1701   RBC 5.53 (H) 07/25/2016 1701   HGB 14.7 07/25/2016 1701   HCT 43.9 07/25/2016 1701   PLT 187 07/25/2016 1701   MCV 79.4 07/25/2016 1701   MCH 26.6 07/25/2016 1701   MCHC 33.5 07/25/2016 1701   RDW 16.8 (H) 07/25/2016 1701   LYMPHSABS 1.1 07/25/2016 1701   MONOABS 0.3 07/25/2016 1701   EOSABS 0.0 07/25/2016 1701   BASOSABS 0.0 07/25/2016 1701   HEPATIC Function Panel  Recent Labs  03/24/16 1553 06/13/16 1440 07/25/16 1701  PROT 6.9 6.7 6.5   HEMOGLOBIN A1C No components found for: HGA1C,  MPG CARDIAC ENZYMES Lab Results  Component Value Date   CKTOTAL 59 09/07/2012   CKMB 3.2 09/07/2012   TROPONINI <0.03 07/26/2016   TROPONINI <0.03 07/25/2016   TROPONINI <  0.03 07/25/2016   BNP No results for input(s): PROBNP in the last 8760 hours. TSH No results for input(s): TSH in the last 8760 hours. CHOLESTEROL No results for input(s): CHOL in the last 8760 hours.  Scheduled Meds: . allopurinol  100 mg Oral Daily  . amLODipine  5 mg Oral Daily  . carvedilol  3.125 mg Oral BID WC  . cholecalciferol  1,000 Units Oral Daily  . collagenase   Topical Daily  . cycloSPORINE  1 drop Both Eyes Daily  . digoxin  0.0625 mg Oral Daily  . feeding supplement (ENSURE ENLIVE)  237 mL Oral BID BM  . furosemide  60 mg Intravenous Q12H  . heparin  5,000 Units Subcutaneous Q8H  . metolazone  2.5 mg Oral Daily  . olopatadine  1 drop Both Eyes Daily  . polyvinyl alcohol  1 drop Both Eyes Daily  . sacubitril-valsartan  1 tablet Oral BID  . sodium  chloride flush  3 mL Intravenous Q12H  . 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   LOS: 1 day    Orpah CobbAjay Tiare Rohlman  MD  07/26/2016, 9:55 AM

## 2016-07-26 NOTE — Care Management Note (Signed)
Case Management Note  Patient Details  Name: [name] MRN: [id number] Date of Birth: [birth year]  Subjective/Objective:    CHF               Action/Plan: Patient known to me from previous admissions. Lives at home with spouse; PCP is Dr Andrey CampanileWilson; she has insurance with Medicaid with prescription drug coverage; pharmacy of choice is [facility]; she has scales at home and knows to weigh herself daily and eats a diet low in sodium. CM will continue to follow for DCP.  Expected Discharge Date:    possibly 07/30/2016              Expected Discharge Plan:  Home Discharge planning Services  CM Consult    Status of Service:  In process, will continue to follow  Reola MosherChandler, Hema Lanza L, RN,MHA,BSN [phone] 07/26/2016, 9:45 AM

## 2016-07-26 NOTE — Progress Notes (Signed)
Initial Nutrition Assessment  DOCUMENTATION CODES:   Non-severe (moderate) malnutrition in context of chronic illness  INTERVENTION:  Provide Glucerna Shake po BID, each supplement provides 220 kcal and 10 grams of protein Provide 30 ml pro-stat once, each dose provides 15 grams of protein and 100 kcal Multivitamin with minerals daily   NUTRITION DIAGNOSIS:   Inadequate oral intake related to chronic illness, acute illness as evidenced by per patient/family report, moderate depletions of muscle mass, moderate depletion of body fat, moderate to severe fluid accumulation.   GOAL:   Patient will meet greater than or equal to 90% of their needs   MONITOR:   PO intake, Supplement acceptance, Labs, Weight trends, Skin, I & O's  REASON FOR ASSESSMENT:   Malnutrition Screening Tool    ASSESSMENT:   66 yrs old female with known history  of T2DM, of Bi-Ventricular systolic heart failure has increasing leg edema and shortness of breath. Patient denies dietary non-compliance. She also has right lower leg healing wounds.   Pt reports having a poor appetite for several months due to swelling. She reports eating very little. Due to fluid/edema there is no known weight loss. Pt reports usual weight of 137 lbs. Per nursing notes, pt ate 50% of lunch. Pt has moderate muscle wasting and moderate fat wasting per nutrition-focused physical exam. RD recommended consuming a nutrition supplement until PO intake improves.   Labs: high BUN, high creatinine, low calcium, low GFR  Diet Order:  Diet Heart Room service appropriate? Yes; Fluid consistency: Thin; Fluid restriction: 1200 mL Fluid  Skin:  Wound (see comment) (diabetic ulcers on right and left lower legs)  Last BM:  1/16  Height:   Ht Readings from Last 1 Encounters:  07/25/16 5\' 5"  (1.651 m)    Weight:   Wt Readings from Last 1 Encounters:  07/26/16 163 lb 4.8 oz (74.1 kg)    Ideal Body Weight:  56.8 kg  BMI:  Body mass  index is 27.17 kg/m.  Estimated Nutritional Needs:   Kcal:  1500-1700  Protein:  75-85 grams  Fluid:  1.7 L/day  EDUCATION NEEDS:   No education needs identified at this time  Dorothea Ogleeanne Naiomy Watters RD, CSP, LDN Inpatient Clinical Dietitian Pager: [phone] After Hours Pager: [phone]

## 2016-07-26 NOTE — Progress Notes (Signed)
Patient alarmed 6 beats v-tach.  Patient asymptomatic.  Dr. Algie CofferKadakia made aware and new orders received.  Will continue to monitor.

## 2016-07-26 NOTE — Consult Note (Addendum)
WOC Nurse wound consult note [facility] is [facility] from [facility], refer to progress notes on 12/6.  Wounds have improved since last assessment. Reason for Consult:Right lower leg with chronic stasis ulcers; .4X.4X.2cm; .3X.3X.1cm. .2X.2X.2cm.  All wounds 90% red, 10% yellow, small amt yellow drainage, no odor. Left lower calf with partial thickness stasis ulcer; .1X.1X.1cm, pink and moist, small amt yellow drainage, no odor. Plan: Pt has been using Santyl ointment prior to admission for the right leg and has a new tube at the bedside.  Continue present plan of care with Santyl and nonadherent dressing and kerlex to right leg.  She has been using antibiotic ointment to left leg and a dressing and has a tube at the bedside. Foam dressing applied to absorb drainage and protect wound.  Discussed plan of care and she verbalized understanding. Please re-consult if further assistance is needed.  Thank-you,  Cammie Mcgeeawn Mekaela Azizi MSN, RN, CWOCN, [place]AP, CNS [phone]

## 2016-07-27 DIAGNOSIS — E440 Moderate protein-calorie malnutrition: Secondary | ICD-10-CM | POA: Insufficient documentation

## 2016-07-27 LAB — BASIC METABOLIC PANEL
ANION GAP: 11 (ref 5–15)
BUN: 58 mg/dL — ABNORMAL HIGH (ref 6–20)
CALCIUM: 8.7 mg/dL — AB (ref 8.9–10.3)
CO2: 24 mmol/L (ref 22–32)
Chloride: 103 mmol/L (ref 101–111)
Creatinine, Ser: 2.2 mg/dL — ABNORMAL HIGH (ref 0.44–1.00)
GFR calc Af Amer: 26 mL/min — ABNORMAL LOW (ref >60)
GFR, EST NON AFRICAN AMERICAN: 22 mL/min — AB (ref >60)
Glucose, Bld: 101 mg/dL — ABNORMAL HIGH (ref 65–99)
Potassium: 4.4 mmol/L (ref 3.5–5.1)
Sodium: 138 mmol/L (ref 135–145)

## 2016-07-27 LAB — MAGNESIUM: MAGNESIUM: 2.4 mg/dL (ref 1.7–2.4)

## 2016-07-27 MED ORDER — DOPAMINE-DEXTROSE 3.2-5 MG/ML-% IV SOLN
5.0000 ug/kg/min | INTRAVENOUS | Status: DC
  Administered 2016-07-27 – 2016-07-30 (×3): 5 ug/kg/min via INTRAVENOUS
  Filled 2016-07-27 (×4): qty 250

## 2016-07-27 NOTE — Progress Notes (Signed)
Ref: Tanya Rodriguez, MD  Subjective:  Feeling better with increased diuresis.   Objective:  Vital Signs in the last 24 hours: Temp:  [97.4 F (36.3 C)-97.7 F (36.5 C)] 97.7 F (36.5 C) (01/17 1958) Pulse Rate:  [53-82] 56 (01/17 1958) Cardiac Rhythm: Normal sinus rhythm (01/17 1507) Resp:  [16-18] 18 (01/17 1958) BP: (104-143)/(50-77) 131/77 (01/17 1958) SpO2:  [97 %-100 %] 99 % (01/17 1958) Weight:  [73.4 kg (161 lb 14.4 oz)] 73.4 kg (161 lb 14.4 oz) (01/17 0511)  Physical Exam: BP Readings from Last 1 Encounters:  07/27/16 131/77    Wt Readings from Last 1 Encounters:  07/27/16 73.4 kg (161 lb 14.4 oz)    Weight change: 2.903 kg (6 lb 6.4 oz) Body mass index is 26.94 kg/m. HEENT: [place]/AT, Eyes-Brown, PERL, EOMI, Conjunctiva-Pink, Sclera-Non-icteric Neck: ++ JVD, No bruit, Trachea midline. Lungs:  Crackles, Bilateral. Cardiac:  Regular rhythm, normal S1 and S2, no S3. II/VI systolic murmur. Abdomen:  Soft, non-tender. BS present. Extremities:  2 + edema as before present. No cyanosis. No clubbing. CNS: AxOx3, Cranial nerves grossly intact, moves all 4 extremities.  Skin: Warm and dry.   Intake/Output from previous day: 01/16 0701 - 01/17 0700 In: 1178 [P.O.:1075; I.V.:3] Out: 1000 [Urine:1000]    Lab Results: BMET    Component Value Date/Time   NA 138 07/27/2016 0454   NA 134 (L) 07/26/2016 0145   NA 135 07/25/2016 1701   K 4.4 07/27/2016 0454   K 4.5 07/26/2016 0145   K 4.5 07/25/2016 1701   CL 103 07/27/2016 0454   CL 102 07/26/2016 0145   CL 105 07/25/2016 1701   CO2 24 07/27/2016 0454   CO2 21 (L) 07/26/2016 0145   CO2 19 (L) 07/25/2016 1701   GLUCOSE 101 (H) 07/27/2016 0454   GLUCOSE 116 (H) 07/26/2016 0145   GLUCOSE 85 07/25/2016 1701   BUN 58 (H) 07/27/2016 0454   BUN 53 (H) 07/26/2016 0145   BUN 52 (H) 07/25/2016 1701   CREATININE 2.20 (H) 07/27/2016 0454   CREATININE 2.06 (H) 07/26/2016 0145   CREATININE 1.93 (H) 07/25/2016 1701   CALCIUM  8.7 (L) 07/27/2016 0454   CALCIUM 8.5 (L) 07/26/2016 0145   CALCIUM 8.5 (L) 07/25/2016 1701   GFRNONAA 22 (L) 07/27/2016 0454   GFRNONAA 24 (L) 07/26/2016 0145   GFRNONAA 26 (L) 07/25/2016 1701   GFRAA 26 (L) 07/27/2016 0454   GFRAA 28 (L) 07/26/2016 0145   GFRAA 30 (L) 07/25/2016 1701   CBC    Component Value Date/Time   WBC 3.7 (L) 07/25/2016 1701   RBC 5.53 (H) 07/25/2016 1701   HGB 14.7 07/25/2016 1701   HCT 43.9 07/25/2016 1701   PLT 187 07/25/2016 1701   MCV 79.4 07/25/2016 1701   MCH 26.6 07/25/2016 1701   MCHC 33.5 07/25/2016 1701   RDW 16.8 (H) 07/25/2016 1701   LYMPHSABS 1.1 07/25/2016 1701   MONOABS 0.3 07/25/2016 1701   EOSABS 0.0 07/25/2016 1701   BASOSABS 0.0 07/25/2016 1701   HEPATIC Function Panel  Recent Labs  03/24/16 1553 06/13/16 1440 07/25/16 1701  PROT 6.9 6.7 6.5   HEMOGLOBIN A1C No components found for: HGA1C,  MPG CARDIAC ENZYMES Lab Results  Component Value Date   CKTOTAL 59 09/07/2012   CKMB 3.2 09/07/2012   TROPONINI <0.03 07/26/2016   TROPONINI <0.03 07/25/2016   TROPONINI <0.03 07/25/2016   BNP No results for input(s): PROBNP in the last 8760 hours. TSH No results for  input(s): TSH in the last 8760 hours. CHOLESTEROL No results for input(s): CHOL in the last 8760 hours.  Scheduled Meds: . allopurinol  100 mg Oral Daily  . amLODipine  5 mg Oral Daily  . carvedilol  3.125 mg Oral BID WC  . cholecalciferol  1,000 Units Oral Daily  . collagenase   Topical Daily  . cycloSPORINE  1 drop Both Eyes Daily  . digoxin  0.0625 mg Oral Daily  . feeding supplement (GLUCERNA SHAKE)  237 mL Oral BID BM  . feeding supplement (PRO-STAT SUGAR FREE 64)  30 mL Oral Daily  . furosemide  60 mg Intravenous Q12H  . heparin  5,000 Units Subcutaneous Q8H  . metolazone  2.5 mg Oral Daily  . multivitamin with minerals  1 tablet Oral Daily  . olopatadine  1 drop Both Eyes Daily  . polyvinyl alcohol  1 drop Both Eyes Daily  . sacubitril-valsartan   1 tablet Oral BID  . sodium chloride flush  3 mL Intravenous Q12H  . spironolactone  25 mg Oral Daily   Continuous Infusions: . 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   LOS: 2 days    Orpah CobbAjay Merdith Boyd  MD  07/27/2016, 8:38 PM

## 2016-07-28 LAB — BASIC METABOLIC PANEL
ANION GAP: 13 (ref 5–15)
BUN: 56 mg/dL — ABNORMAL HIGH (ref 6–20)
CHLORIDE: 100 mmol/L — AB (ref 101–111)
CO2: 25 mmol/L (ref 22–32)
Calcium: 9.4 mg/dL (ref 8.9–10.3)
Creatinine, Ser: 1.8 mg/dL — ABNORMAL HIGH (ref 0.44–1.00)
GFR calc Af Amer: 33 mL/min — ABNORMAL LOW (ref >60)
GFR, EST NON AFRICAN AMERICAN: 29 mL/min — AB (ref >60)
GLUCOSE: 142 mg/dL — AB (ref 65–99)
POTASSIUM: 3.9 mmol/L (ref 3.5–5.1)
Sodium: 138 mmol/L (ref 135–145)

## 2016-07-28 IMAGING — DX DG CHEST 2V
2 series · 2 of 2 positions shown · non-contrast
Comparison: 06/18/2015

CLINICAL DATA: Short of breath, productive cough

EXAM:
CHEST  2 VIEW

[w chest pa]
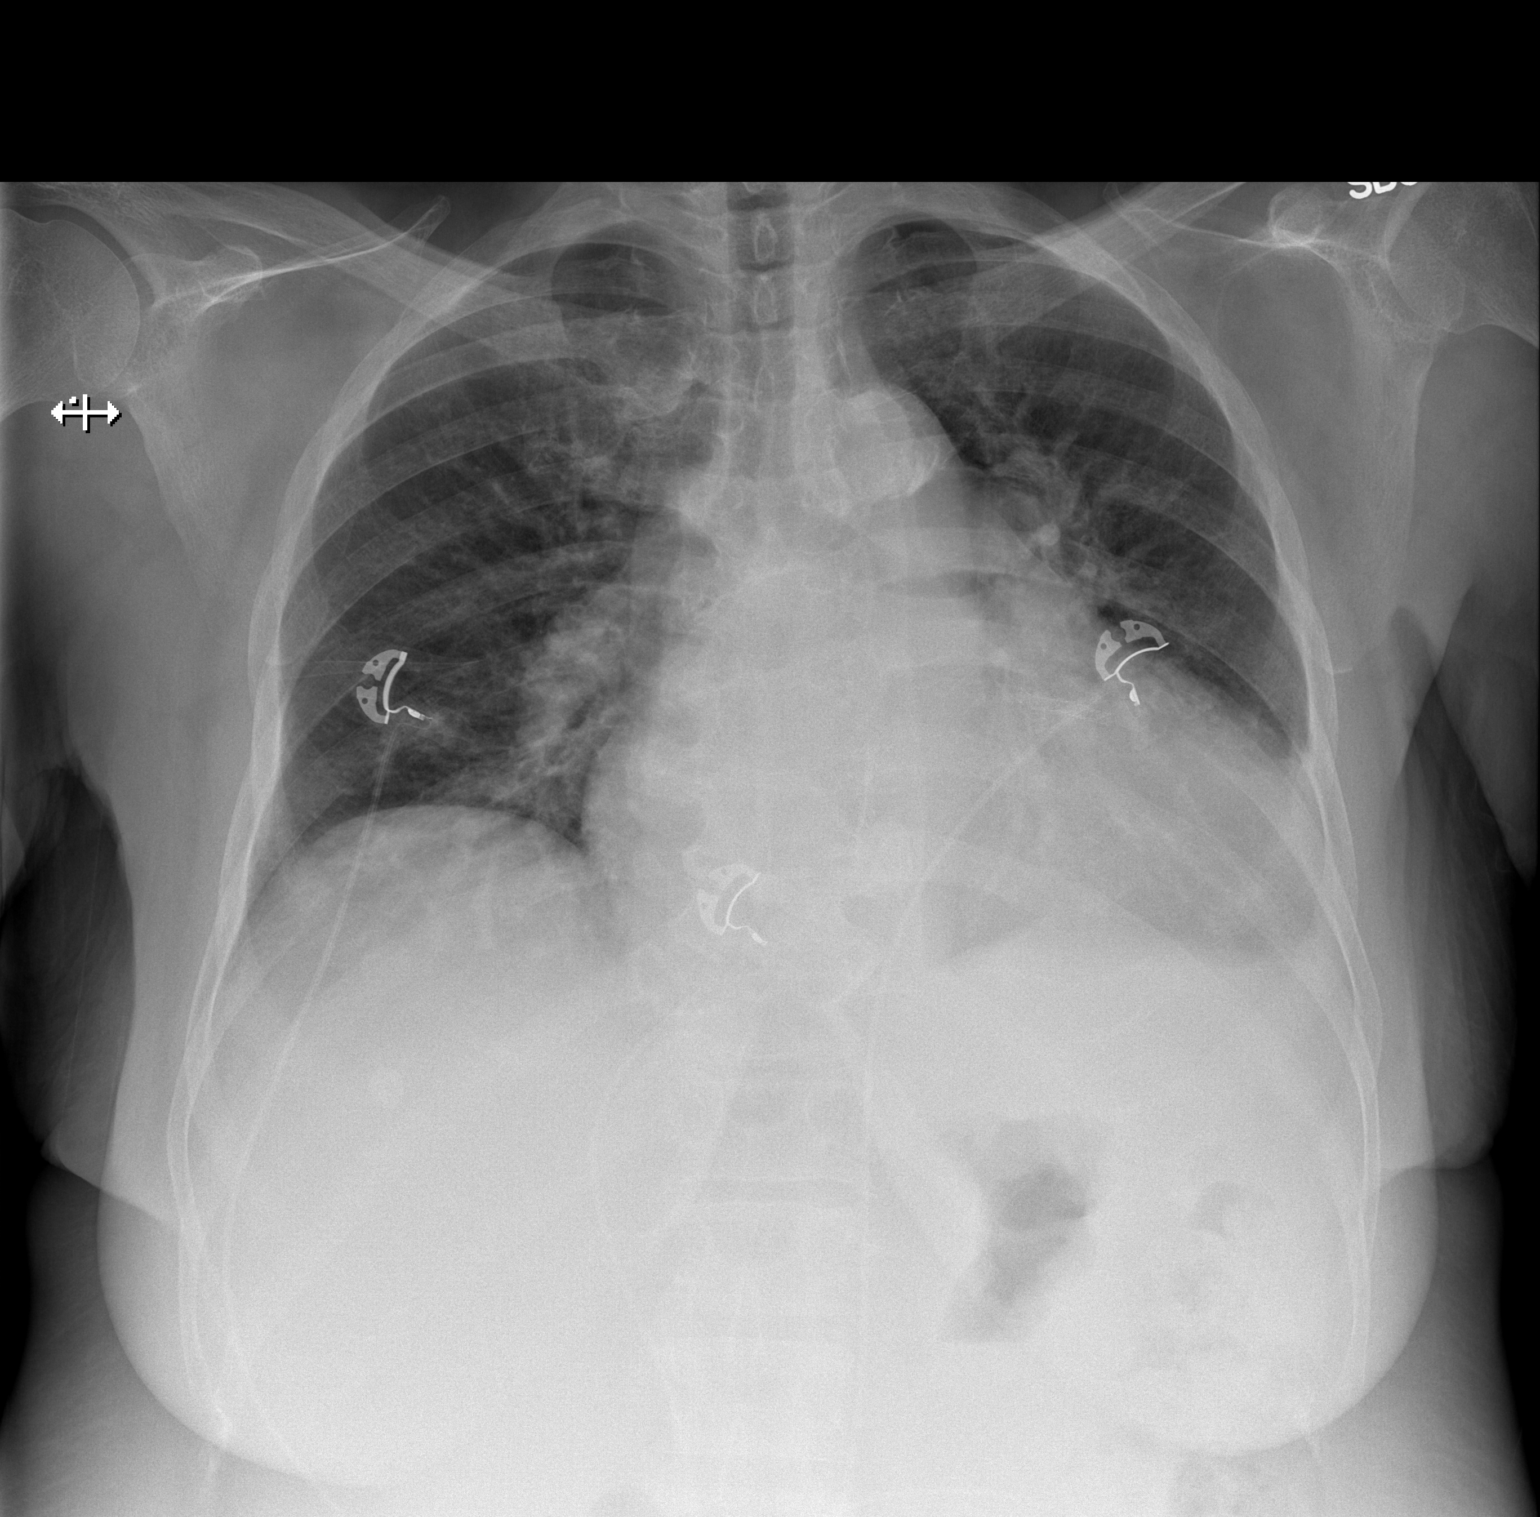

[w chest lat]
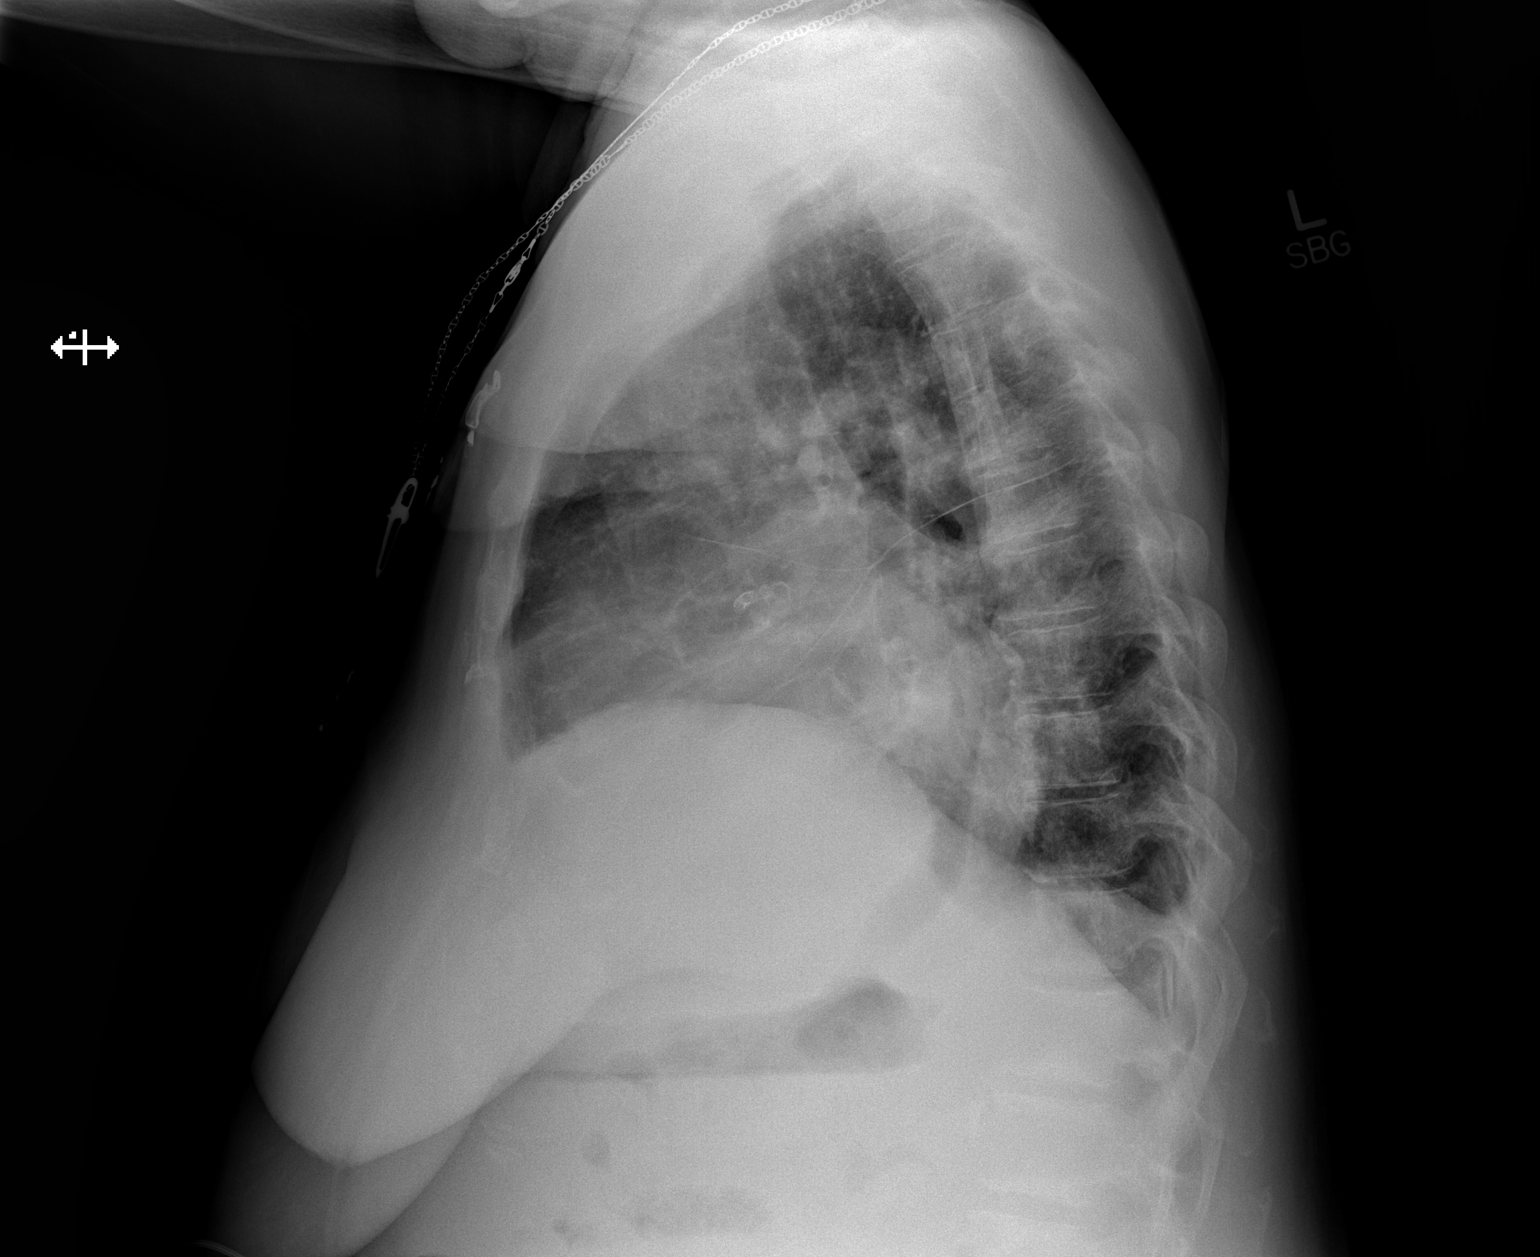

[2 of 2 positions shown; findings below may reference images not displayed]

FINDINGS: Stable enlarged cardiac silhouette. There is prominence of central
pulmonary vascular markings. No overt pulmonary edema. No focal
infiltrate. Rounded 8 mm calcific density projecting over the
inferior RIGHT lower lobe is likely a benign calcification.
IMPRESSION: Cardiomegaly and central venous congestion.

## 2016-07-28 MED ORDER — GUAIFENESIN-DM 100-10 MG/5ML PO SYRP
5.0000 mL | ORAL_SOLUTION | ORAL | Status: DC | PRN
  Administered 2016-07-29 – 2016-07-31 (×3): 5 mL via ORAL
  Filled 2016-07-28 (×3): qty 5

## 2016-07-28 MED ORDER — FUROSEMIDE 10 MG/ML IJ SOLN
40.0000 mg | Freq: Two times a day (BID) | INTRAMUSCULAR | Status: DC
  Administered 2016-07-28 – 2016-08-01 (×9): 40 mg via INTRAVENOUS
  Filled 2016-07-28 (×9): qty 4

## 2016-07-28 MED ORDER — POTASSIUM CHLORIDE CRYS ER 10 MEQ PO TBCR
10.0000 meq | EXTENDED_RELEASE_TABLET | Freq: Two times a day (BID) | ORAL | Status: DC
  Administered 2016-07-28 (×2): 10 meq via ORAL
  Filled 2016-07-28 (×2): qty 1

## 2016-07-28 NOTE — Progress Notes (Signed)
Pt. HR elevated when ambulatory. Pt. Denies pain or discomfort. Will continue to monitor. Tulip Meharg, Cheryll DessertKaren Cherrell

## 2016-07-28 NOTE — Progress Notes (Signed)
Ref: [name], MD   Subjective:  Feeling little better. Do not like protein supplements. Good diuresis.  Objective:  Vital Signs in the last 24 hours: Temp:  [97.4 F (36.3 C)-98.1 F (36.7 C)] 98.1 F (36.7 C) (01/18 1012) Pulse Rate:  [56-76] 75 (01/18 1012) Cardiac Rhythm: Normal sinus rhythm (01/18 0705) Resp:  [16-18] 16 (01/18 1012) BP: (131-158)/(62-77) 146/72 (01/18 1012) SpO2:  [95 %-100 %] 95 % (01/18 1012) Weight:  [68.4 kg (150 lb 11.2 oz)] 68.4 kg (150 lb 11.2 oz) (01/18 0456)  Physical Exam: BP Readings from Last 1 Encounters:  07/28/16 (!) 146/72    Wt Readings from Last 1 Encounters:  07/28/16 68.4 kg (150 lb 11.2 oz)    Weight change: -5.08 kg (-11 lb 3.2 oz) Body mass index is 25.08 kg/m. HEENT: [place]/AT, Eyes-Brown, PERL, EOMI, Conjunctiva-Pink, Sclera-Non-icteric Neck: + JVD, No bruit, Trachea midline. Lungs:  Clearing, Bilateral. Cardiac:  Regular rhythm, normal S1 and S2, no S3. II/VI systolic murmur. Abdomen:  Soft, non-tender. BS present. Extremities:  2 + edema upto thigh persist and presacral edema is present. No cyanosis. No clubbing. CNS: AxOx3, Cranial nerves grossly intact, moves all 4 extremities.  Skin: Warm and dry.   Intake/Output from previous day: 01/17 0701 - 01/18 0700 In: 1098.5 [P.O.:990; I.V.:108.5] Out: 6950 [Urine:6950]    Lab Results: BMET    Component Value Date/Time   NA 138 07/28/2016 0432   NA 138 07/27/2016 0454   NA 134 (L) 07/26/2016 0145   K 3.9 07/28/2016 0432   K 4.4 07/27/2016 0454   K 4.5 07/26/2016 0145   CL 100 (L) 07/28/2016 0432   CL 103 07/27/2016 0454   CL 102 07/26/2016 0145   CO2 25 07/28/2016 0432   CO2 24 07/27/2016 0454   CO2 21 (L) 07/26/2016 0145   GLUCOSE 142 (H) 07/28/2016 0432   GLUCOSE 101 (H) 07/27/2016 0454   GLUCOSE 116 (H) 07/26/2016 0145   BUN 56 (H) 07/28/2016 0432   BUN 58 (H) 07/27/2016 0454   BUN 53 (H) 07/26/2016 0145   CREATININE 1.80 (H) 07/28/2016 0432   CREATININE 2.20 (H) 07/27/2016 0454   CREATININE 2.06 (H) 07/26/2016 0145   CALCIUM 9.4 07/28/2016 0432   CALCIUM 8.7 (L) 07/27/2016 0454   CALCIUM 8.5 (L) 07/26/2016 0145   GFRNONAA 29 (L) 07/28/2016 0432   GFRNONAA 22 (L) 07/27/2016 0454   GFRNONAA 24 (L) 07/26/2016 0145   GFRAA 33 (L) 07/28/2016 0432   GFRAA 26 (L) 07/27/2016 0454   GFRAA 28 (L) 07/26/2016 0145   CBC    Component Value Date/Time   WBC 3.7 (L) 07/25/2016 1701   RBC 5.53 (H) 07/25/2016 1701   HGB 14.7 07/25/2016 1701   HCT 43.9 07/25/2016 1701   PLT 187 07/25/2016 1701   MCV 79.4 07/25/2016 1701   MCH 26.6 07/25/2016 1701   MCHC 33.5 07/25/2016 1701   RDW 16.8 (H) 07/25/2016 1701   LYMPHSABS 1.1 07/25/2016 1701   MONOABS 0.3 07/25/2016 1701   EOSABS 0.0 07/25/2016 1701   BASOSABS 0.0 07/25/2016 1701   HEPATIC Function Panel  Recent Labs  03/24/16 1553 06/13/16 1440 07/25/16 1701  PROT 6.9 6.7 6.5   HEMOGLOBIN A1C No components found for: HGA1C,  MPG CARDIAC ENZYMES Lab Results  Component Value Date   CKTOTAL 59 09/07/2012   CKMB 3.2 09/07/2012   TROPONINI <0.03 07/26/2016   TROPONINI <0.03 07/25/2016   TROPONINI <0.03 07/25/2016   BNP No results for input(s):  PROBNP in the last 8760 hours. TSH No results for input(s): TSH in the last 8760 hours. CHOLESTEROL No results for input(s): CHOL in the last 8760 hours.  Scheduled Meds: . allopurinol  100 mg Oral Daily  . amLODipine  5 mg Oral Daily  . carvedilol  3.125 mg Oral BID WC  . cholecalciferol  1,000 Units Oral Daily  . collagenase   Topical Daily  . cycloSPORINE  1 drop Both Eyes Daily  . digoxin  0.0625 mg Oral Daily  . feeding supplement (GLUCERNA SHAKE)  237 mL Oral BID BM  . feeding supplement (PRO-STAT SUGAR FREE 64)  30 mL Oral Daily  . furosemide  40 mg Intravenous Q12H  . heparin  5,000 Units Subcutaneous Q8H  . multivitamin with minerals  1 tablet Oral Daily  . olopatadine  1 drop Both Eyes Daily  . polyvinyl alcohol  1  drop Both Eyes Daily  . potassium chloride  10 mEq Oral BID  . sacubitril-valsartan  1 tablet Oral BID  . sodium chloride flush  3 mL Intravenous Q12H  . spironolactone  25 mg Oral Daily   Continuous Infusions: . 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  Decrease lasix use. Resume potassium supplement.   LOS: 3 days    Orpah CobbAjay Akima Slaugh  MD  07/28/2016, 11:19 AM

## 2016-07-29 LAB — BASIC METABOLIC PANEL
ANION GAP: 10 (ref 5–15)
BUN: 50 mg/dL — ABNORMAL HIGH (ref 6–20)
CALCIUM: 9.2 mg/dL (ref 8.9–10.3)
CO2: 28 mmol/L (ref 22–32)
CREATININE: 1.54 mg/dL — AB (ref 0.44–1.00)
Chloride: 98 mmol/L — ABNORMAL LOW (ref 101–111)
GFR, EST AFRICAN AMERICAN: 40 mL/min — AB (ref >60)
GFR, EST NON AFRICAN AMERICAN: 35 mL/min — AB (ref >60)
Glucose, Bld: 123 mg/dL — ABNORMAL HIGH (ref 65–99)
Potassium: 3.6 mmol/L (ref 3.5–5.1)
SODIUM: 136 mmol/L (ref 135–145)

## 2016-07-29 MED ORDER — POTASSIUM CHLORIDE CRYS ER 10 MEQ PO TBCR
10.0000 meq | EXTENDED_RELEASE_TABLET | Freq: Three times a day (TID) | ORAL | Status: DC
  Administered 2016-07-29 – 2016-08-02 (×14): 10 meq via ORAL
  Filled 2016-07-29 (×14): qty 1

## 2016-07-29 MED ORDER — AMLODIPINE BESYLATE 2.5 MG PO TABS
2.5000 mg | ORAL_TABLET | Freq: Every day | ORAL | Status: DC
  Administered 2016-07-30 – 2016-08-03 (×5): 2.5 mg via ORAL
  Filled 2016-07-29 (×5): qty 1

## 2016-07-29 NOTE — Progress Notes (Signed)
Ref: [name], MD   Subjective:  Feeling better breathing wise but exhausted today. No chest pain. Mild improvement in BUN/Cr.  Objective:  Vital Signs in the last 24 hours: Temp:  [98 F (36.7 C)-99.3 F (37.4 C)] 98.1 F (36.7 C) (01/19 0800) Pulse Rate:  [64-81] 64 (01/19 0800) Cardiac Rhythm: Normal sinus rhythm (01/19 0700) Resp:  [16-18] 18 (01/19 0800) BP: (111-152)/(56-79) 122/64 (01/19 0800) SpO2:  [95 %-100 %] 100 % (01/19 0800) Weight:  [63.2 kg (139 lb 6.4 oz)] 63.2 kg (139 lb 6.4 oz) (01/19 0657)  Physical Exam: BP Readings from Last 1 Encounters:  07/29/16 122/64    Wt Readings from Last 1 Encounters:  07/29/16 63.2 kg (139 lb 6.4 oz)    Weight change: -5.126 kg (-11 lb 4.8 oz) Body mass index is 23.2 kg/m. HEENT: [place]/AT, Eyes-Brown, PERL, EOMI, Conjunctiva-Pink, Sclera-Non-icteric Neck: + JVD, No bruit, Trachea midline. Lungs:  Clearing, Bilateral. Cardiac:  Regular rhythm, normal S1 and S2, no S3. II/VI systolic murmur. Abdomen:  Soft, non-tender. BS present. Extremities:  2 + edema of lower leg, 1 + thigh and presacral edema present. No cyanosis. No clubbing. CNS: AxOx3, Cranial nerves grossly intact, moves all 4 extremities.  Skin: Warm and dry.   Intake/Output from previous day: 01/18 0701 - 01/19 0700 In: 1718 [P.O.:1580; I.V.:138] Out: 6400 [Urine:6400]    Lab Results: BMET    Component Value Date/Time   NA 136 07/29/2016 0656   NA 138 07/28/2016 0432   NA 138 07/27/2016 0454   K 3.6 07/29/2016 0656   K 3.9 07/28/2016 0432   K 4.4 07/27/2016 0454   CL 98 (L) 07/29/2016 0656   CL 100 (L) 07/28/2016 0432   CL 103 07/27/2016 0454   CO2 28 07/29/2016 0656   CO2 25 07/28/2016 0432   CO2 24 07/27/2016 0454   GLUCOSE 123 (H) 07/29/2016 0656   GLUCOSE 142 (H) 07/28/2016 0432   GLUCOSE 101 (H) 07/27/2016 0454   BUN 50 (H) 07/29/2016 0656   BUN 56 (H) 07/28/2016 0432   BUN 58 (H) 07/27/2016 0454   CREATININE 1.54 (H) 07/29/2016 0656   CREATININE 1.80 (H) 07/28/2016 0432   CREATININE 2.20 (H) 07/27/2016 0454   CALCIUM 9.2 07/29/2016 0656   CALCIUM 9.4 07/28/2016 0432   CALCIUM 8.7 (L) 07/27/2016 0454   GFRNONAA 35 (L) 07/29/2016 0656   GFRNONAA 29 (L) 07/28/2016 0432   GFRNONAA 22 (L) 07/27/2016 0454   GFRAA 40 (L) 07/29/2016 0656   GFRAA 33 (L) 07/28/2016 0432   GFRAA 26 (L) 07/27/2016 0454   CBC    Component Value Date/Time   WBC 3.7 (L) 07/25/2016 1701   RBC 5.53 (H) 07/25/2016 1701   HGB 14.7 07/25/2016 1701   HCT 43.9 07/25/2016 1701   PLT 187 07/25/2016 1701   MCV 79.4 07/25/2016 1701   MCH 26.6 07/25/2016 1701   MCHC 33.5 07/25/2016 1701   RDW 16.8 (H) 07/25/2016 1701   LYMPHSABS 1.1 07/25/2016 1701   MONOABS 0.3 07/25/2016 1701   EOSABS 0.0 07/25/2016 1701   BASOSABS 0.0 07/25/2016 1701   HEPATIC Function Panel  Recent Labs  03/24/16 1553 06/13/16 1440 07/25/16 1701  PROT 6.9 6.7 6.5   HEMOGLOBIN A1C No components found for: HGA1C,  MPG CARDIAC ENZYMES Lab Results  Component Value Date   CKTOTAL 59 09/07/2012   CKMB 3.2 09/07/2012   TROPONINI <0.03 07/26/2016   TROPONINI <0.03 07/25/2016   TROPONINI <0.03 07/25/2016   BNP No  results for input(s): PROBNP in the last 8760 hours. TSH No results for input(s): TSH in the last 8760 hours. CHOLESTEROL No results for input(s): CHOL in the last 8760 hours.  Scheduled Meds: . allopurinol  100 mg Oral Daily  . amLODipine  5 mg Oral Daily  . carvedilol  3.125 mg Oral BID WC  . cholecalciferol  1,000 Units Oral Daily  . collagenase   Topical Daily  . cycloSPORINE  1 drop Both Eyes Daily  . digoxin  0.0625 mg Oral Daily  . feeding supplement (GLUCERNA SHAKE)  237 mL Oral BID BM  . feeding supplement (PRO-STAT SUGAR FREE 64)  30 mL Oral Daily  . furosemide  40 mg Intravenous Q12H  . heparin  5,000 Units Subcutaneous Q8H  . multivitamin with minerals  1 tablet Oral Daily  . olopatadine  1 drop Both Eyes Daily  . polyvinyl alcohol  1  drop Both Eyes Daily  . potassium chloride  10 mEq Oral TID  . sacubitril-valsartan  1 tablet Oral BID  . sodium chloride flush  3 mL Intravenous Q12H  . spironolactone  25 mg Oral Daily   Continuous Infusions: . 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 Increase potassium supplement.   LOS: 4 days    Orpah CobbAjay Lasonya Hubner  MD  07/29/2016, 10:01 AM

## 2016-07-29 NOTE — Progress Notes (Signed)
RN in the room to clean and change pts. Dressing to BLE. Pt. Stated she that she did them herself this am.

## 2016-07-30 LAB — BASIC METABOLIC PANEL
ANION GAP: 13 (ref 5–15)
BUN: 44 mg/dL — ABNORMAL HIGH (ref 6–20)
CHLORIDE: 94 mmol/L — AB (ref 101–111)
CO2: 28 mmol/L (ref 22–32)
CREATININE: 1.5 mg/dL — AB (ref 0.44–1.00)
Calcium: 9.5 mg/dL (ref 8.9–10.3)
GFR calc non Af Amer: 36 mL/min — ABNORMAL LOW (ref >60)
GFR, EST AFRICAN AMERICAN: 41 mL/min — AB (ref >60)
Glucose, Bld: 120 mg/dL — ABNORMAL HIGH (ref 65–99)
POTASSIUM: 4.1 mmol/L (ref 3.5–5.1)
Sodium: 135 mmol/L (ref 135–145)

## 2016-07-30 NOTE — Progress Notes (Signed)
Subjective:  Denies any chest pain states breathing and leg swelling is slowly improving  Objective:  Vital Signs in the last 24 hours: Temp:  [98.3 F (36.8 C)-99.4 F (37.4 C)] 98.7 F (37.1 C) (01/20 1008) Pulse Rate:  [68-96] 68 (01/20 1008) Resp:  [18] 18 (01/20 1008) BP: (117-165)/(52-95) 117/52 (01/20 1008) SpO2:  [94 %-100 %] 94 % (01/20 1008) Weight:  [134 lb 8 oz (61 kg)] 134 lb 8 oz (61 kg) (01/20 0439)  Intake/Output from previous day: 01/19 0701 - 01/20 0700 In: 896.2 [P.O.:700; I.V.:196.2] Out: 3300 [Urine:3300] Intake/Output from this shift: Total I/O In: 123 [P.O.:120; I.V.:3] Out: 250 [Urine:250]  Physical Exam: Neck: JVD - 8 cm above sternal notch, no adenopathy, no carotid bruit and supple, symmetrical, trachea midline Lungs: Decreased breath sound at bases Heart: regular rate and rhythm, S1, S2 normal and 2/6 systolic murmur noted Abdomen: soft, non-tender; bowel sounds normal; no masses,  no organomegaly Extremities: No clubbing cyanosis 2+ edema noted right leg chronic ulcer dressing noted  Lab Results: No results for input(s): WBC, HGB, PLT in the last 72 hours.  Recent Labs  07/29/16 0656 07/30/16 0539  NA 136 135  K 3.6 4.1  CL 98* 94*  CO2 28 28  GLUCOSE 123* 120*  BUN 50* 44*  CREATININE 1.54* 1.50*   No results for input(s): TROPONINI in the last 72 hours.  Invalid input(s): CK, MB Hepatic Function Panel No results for input(s): PROT, ALBUMIN, AST, ALT, ALKPHOS, BILITOT, BILIDIR, IBILI in the last 72 hours. No results for input(s): CHOL in the last 72 hours. No results for input(s): PROTIME in the last 72 hours.  Imaging: Imaging results have been reviewed and No results found.  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

## 2016-07-31 MED ORDER — DOPAMINE-DEXTROSE 3.2-5 MG/ML-% IV SOLN
2.5000 ug/kg/min | INTRAVENOUS | Status: DC
  Administered 2016-07-31: 2.5 ug/kg/min via INTRAVENOUS
  Filled 2016-07-31: qty 250

## 2016-07-31 NOTE — Progress Notes (Signed)
Patient alert and oriented, denies pain, no shortness of breath. VSS. Will continue to monitor the patient.

## 2016-07-31 NOTE — Progress Notes (Signed)
Subjective:  Doing well denies any chest pain states breathing and leg swelling is markedly improved. Remains on dopamine diuresing well.  Objective:  Vital Signs in the last 24 hours: Temp:  [98.4 F (36.9 C)-98.7 F (37.1 C)] 98.4 F (36.9 C) (01/20 2113) Pulse Rate:  [68-88] 88 (01/21 0640) Resp:  [15-18] 15 (01/21 0640) BP: (117-149)/(52-83) 149/80 (01/21 0640) SpO2:  [94 %-100 %] 100 % (01/21 0640) Weight:  [131 lb 11.2 oz (59.7 kg)] 131 lb 11.2 oz (59.7 kg) (01/21 0640)  Intake/Output from previous day: 01/20 0701 - 01/21 0700 In: 1269.3 [P.O.:1080; I.V.:189.3] Out: 3100 [Urine:3100] Intake/Output from this shift: No intake/output data recorded.  Physical Exam: Neck: no adenopathy, no carotid bruit, no JVD and supple, symmetrical, trachea midline Lungs: clear to auscultation bilaterally Heart: regular rate and rhythm, S1, S2 normal and 2/6 systolic murmur noted Abdomen: soft, non-tender; bowel sounds normal; no masses,  no organomegaly Extremities: No clubbing cyanosis 2+ edema noted  Lab Results: No results for input(s): WBC, HGB, PLT in the last 72 hours.  Recent Labs  07/29/16 0656 07/30/16 0539  NA 136 135  K 3.6 4.1  CL 98* 94*  CO2 28 28  GLUCOSE 123* 120*  BUN 50* 44*  CREATININE 1.54* 1.50*   No results for input(s): TROPONINI in the last 72 hours.  Invalid input(s): CK, MB Hepatic Function Panel No results for input(s): PROT, ALBUMIN, AST, ALT, ALKPHOS, BILITOT, BILIDIR, IBILI in the last 72 hours. No results for input(s): CHOL in the last 72 hours. No results for input(s): PROTIME in the last 72 hours.  Imaging: Imaging results have been reviewed and No results found.  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 Continue rest of the medications  LOS: 6 days    Rinaldo CloudHarwani, Damein Gaunce 07/31/2016, 9:35 AM

## 2016-08-01 NOTE — Progress Notes (Signed)
Ref: [name], MD   Subjective:  Feeling better. On lower dose of dopamine drip with decreased diuresis. Patient has lost approx. 24 pounds of weight in 1 week.   Objective:  Vital Signs in the last 24 hours: Temp:  [97.7 F (36.5 C)-97.9 F (36.6 C)] 97.7 F (36.5 C) (01/22 0549) Pulse Rate:  [60-77] 60 (01/22 1038) Cardiac Rhythm: Normal sinus rhythm (01/22 0714) Resp:  [16] 16 (01/22 0549) BP: (105-142)/(55-80) 105/55 (01/22 1038) SpO2:  [100 %] 100 % (01/22 0549) Weight:  [59.6 kg (131 lb 4.8 oz)] 59.6 kg (131 lb 4.8 oz) (01/22 0549)  Physical Exam: BP Readings from Last 1 Encounters:  08/01/16 (!) 105/55    Wt Readings from Last 1 Encounters:  08/01/16 59.6 kg (131 lb 4.8 oz)    Weight change: -0.181 kg (-6.4 oz) Body mass index is 21.85 kg/m. HEENT: [place]/AT, Eyes-Brown, PERL, EOMI, Conjunctiva-Pink, Sclera-Non-icteric Neck: No JVD, No bruit, Trachea midline. Lungs:  Clear, Bilateral. Cardiac:  Regular rhythm, normal S1 and S2, no S3. II/VI systolic murmur. Abdomen:  Soft, non-tender. BS present. Extremities:  1 + lower leg and trace presacral edema is present. No cyanosis. No clubbing. CNS: AxOx3, Cranial nerves grossly intact, moves all 4 extremities.  Skin: Warm and dry.   Intake/Output from previous day: 01/21 0701 - 01/22 0700 In: 367.6 [P.O.:360; I.V.:7.6] Out: 1300 [Urine:1300]    Lab Results: BMET    Component Value Date/Time   NA 135 07/30/2016 0539   NA 136 07/29/2016 0656   NA 138 07/28/2016 0432   K 4.1 07/30/2016 0539   K 3.6 07/29/2016 0656   K 3.9 07/28/2016 0432   CL 94 (L) 07/30/2016 0539   CL 98 (L) 07/29/2016 0656   CL 100 (L) 07/28/2016 0432   CO2 28 07/30/2016 0539   CO2 28 07/29/2016 0656   CO2 25 07/28/2016 0432   GLUCOSE 120 (H) 07/30/2016 0539   GLUCOSE 123 (H) 07/29/2016 0656   GLUCOSE 142 (H) 07/28/2016 0432   BUN 44 (H) 07/30/2016 0539   BUN 50 (H) 07/29/2016 0656   BUN 56 (H) 07/28/2016 0432   CREATININE 1.50 (H)  07/30/2016 0539   CREATININE 1.54 (H) 07/29/2016 0656   CREATININE 1.80 (H) 07/28/2016 0432   CALCIUM 9.5 07/30/2016 0539   CALCIUM 9.2 07/29/2016 0656   CALCIUM 9.4 07/28/2016 0432   GFRNONAA 36 (L) 07/30/2016 0539   GFRNONAA 35 (L) 07/29/2016 0656   GFRNONAA 29 (L) 07/28/2016 0432   GFRAA 41 (L) 07/30/2016 0539   GFRAA 40 (L) 07/29/2016 0656   GFRAA 33 (L) 07/28/2016 0432   CBC    Component Value Date/Time   WBC 3.7 (L) 07/25/2016 1701   RBC 5.53 (H) 07/25/2016 1701   HGB 14.7 07/25/2016 1701   HCT 43.9 07/25/2016 1701   PLT 187 07/25/2016 1701   MCV 79.4 07/25/2016 1701   MCH 26.6 07/25/2016 1701   MCHC 33.5 07/25/2016 1701   RDW 16.8 (H) 07/25/2016 1701   LYMPHSABS 1.1 07/25/2016 1701   MONOABS 0.3 07/25/2016 1701   EOSABS 0.0 07/25/2016 1701   BASOSABS 0.0 07/25/2016 1701   HEPATIC Function Panel  Recent Labs  03/24/16 1553 06/13/16 1440 07/25/16 1701  PROT 6.9 6.7 6.5   HEMOGLOBIN A1C No components found for: HGA1C,  MPG CARDIAC ENZYMES Lab Results  Component Value Date   CKTOTAL 59 09/07/2012   CKMB 3.2 09/07/2012   TROPONINI <0.03 07/26/2016   TROPONINI <0.03 07/25/2016   TROPONINI <0.03  07/25/2016   BNP No results for input(s): PROBNP in the last 8760 hours. TSH No results for input(s): TSH in the last 8760 hours. CHOLESTEROL No results for input(s): CHOL in the last 8760 hours.  Scheduled Meds: . allopurinol  100 mg Oral Daily  . amLODipine  2.5 mg Oral Daily  . carvedilol  3.125 mg Oral BID WC  . cholecalciferol  1,000 Units Oral Daily  . collagenase   Topical Daily  . cycloSPORINE  1 drop Both Eyes Daily  . digoxin  0.0625 mg Oral Daily  . feeding supplement (GLUCERNA SHAKE)  237 mL Oral BID BM  . feeding supplement (PRO-STAT SUGAR FREE 64)  30 mL Oral Daily  . furosemide  40 mg Intravenous Q12H  . heparin  5,000 Units Subcutaneous Q8H  . multivitamin with minerals  1 tablet Oral Daily  . olopatadine  1 drop Both Eyes Daily  .  polyvinyl alcohol  1 drop Both Eyes Daily  . potassium chloride  10 mEq Oral TID  . sacubitril-valsartan  1 tablet Oral BID  . sodium chloride flush  3 mL Intravenous Q12H  . spironolactone  25 mg Oral Daily   Continuous Infusions: PRN Meds:.sodium chloride, acetaminophen, albuterol, guaiFENesin-dextromethorphan, omeprazole, ondansetron (ZOFRAN) IV, sodium chloride flush  Assessment/Plan: Acute on chronic bi-ventricular systolic failure CAD S/P stent in LAD Hypertension Type II DM CKD, III Mild protein calorie malnutrition Chronic right lower leg wound  Continue current medication. Increase activity for possible discharge in AM.    LOS: 7 days    Orpah CobbAjay Yulian Gosney  MD  08/01/2016, 6:22 PM

## 2016-08-01 NOTE — Progress Notes (Signed)
Patient stable overnight. No complaints of pain overnight. Dressing changed per order. Patient tolerated well. Patient up in recliner asleep.

## 2016-08-02 LAB — BASIC METABOLIC PANEL
Anion gap: 8 (ref 5–15)
BUN: 55 mg/dL — AB (ref 6–20)
CO2: 27 mmol/L (ref 22–32)
CREATININE: 1.58 mg/dL — AB (ref 0.44–1.00)
Calcium: 8.3 mg/dL — ABNORMAL LOW (ref 8.9–10.3)
Chloride: 99 mmol/L — ABNORMAL LOW (ref 101–111)
GFR calc Af Amer: 39 mL/min — ABNORMAL LOW (ref >60)
GFR, EST NON AFRICAN AMERICAN: 34 mL/min — AB (ref >60)
GLUCOSE: 105 mg/dL — AB (ref 65–99)
Potassium: 4.5 mmol/L (ref 3.5–5.1)
SODIUM: 134 mmol/L — AB (ref 135–145)

## 2016-08-02 LAB — CBC
HEMATOCRIT: 47.7 % — AB (ref 36.0–46.0)
HEMOGLOBIN: 15.9 g/dL — AB (ref 12.0–15.0)
MCH: 26.2 pg (ref 26.0–34.0)
MCHC: 33.3 g/dL (ref 30.0–36.0)
MCV: 78.6 fL (ref 78.0–100.0)
Platelets: 195 10*3/uL (ref 150–400)
RBC: 6.07 MIL/uL — ABNORMAL HIGH (ref 3.87–5.11)
RDW: 16.4 % — ABNORMAL HIGH (ref 11.5–15.5)
WBC: 4 10*3/uL (ref 4.0–10.5)

## 2016-08-02 LAB — BRAIN NATRIURETIC PEPTIDE: B NATRIURETIC PEPTIDE 5: 1233.3 pg/mL — AB (ref 0.0–100.0)

## 2016-08-02 MED ORDER — OXYCODONE HCL ER 10 MG PO T12A
10.0000 mg | EXTENDED_RELEASE_TABLET | Freq: Two times a day (BID) | ORAL | Status: DC
Start: 2016-08-02 — End: 2016-08-03
  Administered 2016-08-02 – 2016-08-03 (×3): 10 mg via ORAL
  Filled 2016-08-02 (×3): qty 1

## 2016-08-02 MED ORDER — POTASSIUM CHLORIDE CRYS ER 10 MEQ PO TBCR
10.0000 meq | EXTENDED_RELEASE_TABLET | Freq: Every day | ORAL | Status: DC
  Administered 2016-08-03: 10 meq via ORAL
  Filled 2016-08-02: qty 1

## 2016-08-02 MED ORDER — FUROSEMIDE 40 MG PO TABS
40.0000 mg | ORAL_TABLET | Freq: Two times a day (BID) | ORAL | Status: DC
  Administered 2016-08-02 – 2016-08-03 (×3): 40 mg via ORAL
  Filled 2016-08-02 (×3): qty 1

## 2016-08-02 NOTE — Progress Notes (Signed)
Ref: [name], MD   Subjective:  Feeling better. Ambulating. Increased pain in right leg.  Objective:  Vital Signs in the last 24 hours: Temp:  [97.6 F (36.4 C)-98.1 F (36.7 C)] 97.6 F (36.4 C) (01/23 1420) Pulse Rate:  [57-64] 57 (01/23 1420) Cardiac Rhythm: Normal sinus rhythm (01/23 0803) Resp:  [16-18] 18 (01/23 1420) BP: (122-135)/(61-67) 122/67 (01/23 1420) SpO2:  [99 %-100 %] 99 % (01/23 1420) Weight:  [59.2 kg (130 lb 8 oz)] 59.2 kg (130 lb 8 oz) (01/23 0554)  Physical Exam: BP Readings from Last 1 Encounters:  08/02/16 122/67    Wt Readings from Last 1 Encounters:  08/02/16 59.2 kg (130 lb 8 oz)    Weight change: -0.363 kg (-12.8 oz) Body mass index is 21.72 kg/m. HEENT: [place]/AT, Eyes-Brown, PERL, EOMI, Conjunctiva-Pink, Sclera-Non-icteric Neck: No JVD, No bruit, Trachea midline. Lungs:  Clear, Bilateral. Cardiac:  Regular rhythm, normal S1 and S2, no S3. II/VI systolic murmur. Abdomen:  Soft, non-tender. BS present. Extremities:  Trace lower leg and presacral edema is present. No cyanosis. No clubbing. Dry right lower leg wound. CNS: AxOx3, Cranial nerves grossly intact, moves all 4 extremities.  Skin: Warm and dry.   Intake/Output from previous day: 01/22 0701 - 01/23 0700 In: 862.4 [P.O.:840; I.V.:22.4] Out: 825 [Urine:825]    Lab Results: BMET    Component Value Date/Time   NA 134 (L) 08/02/2016 0433   NA 135 07/30/2016 0539   NA 136 07/29/2016 0656   K 4.5 08/02/2016 0433   K 4.1 07/30/2016 0539   K 3.6 07/29/2016 0656   CL 99 (L) 08/02/2016 0433   CL 94 (L) 07/30/2016 0539   CL 98 (L) 07/29/2016 0656   CO2 27 08/02/2016 0433   CO2 28 07/30/2016 0539   CO2 28 07/29/2016 0656   GLUCOSE 105 (H) 08/02/2016 0433   GLUCOSE 120 (H) 07/30/2016 0539   GLUCOSE 123 (H) 07/29/2016 0656   BUN 55 (H) 08/02/2016 0433   BUN 44 (H) 07/30/2016 0539   BUN 50 (H) 07/29/2016 0656   CREATININE 1.58 (H) 08/02/2016 0433   CREATININE 1.50 (H) 07/30/2016  0539   CREATININE 1.54 (H) 07/29/2016 0656   CALCIUM 8.3 (L) 08/02/2016 0433   CALCIUM 9.5 07/30/2016 0539   CALCIUM 9.2 07/29/2016 0656   GFRNONAA 34 (L) 08/02/2016 0433   GFRNONAA 36 (L) 07/30/2016 0539   GFRNONAA 35 (L) 07/29/2016 0656   GFRAA 39 (L) 08/02/2016 0433   GFRAA 41 (L) 07/30/2016 0539   GFRAA 40 (L) 07/29/2016 0656   CBC    Component Value Date/Time   WBC 4.0 08/02/2016 0433   RBC 6.07 (H) 08/02/2016 0433   HGB 15.9 (H) 08/02/2016 0433   HCT 47.7 (H) 08/02/2016 0433   PLT 195 08/02/2016 0433   MCV 78.6 08/02/2016 0433   MCH 26.2 08/02/2016 0433   MCHC 33.3 08/02/2016 0433   RDW 16.4 (H) 08/02/2016 0433   LYMPHSABS 1.1 07/25/2016 1701   MONOABS 0.3 07/25/2016 1701   EOSABS 0.0 07/25/2016 1701   BASOSABS 0.0 07/25/2016 1701   HEPATIC Function Panel  Recent Labs  03/24/16 1553 06/13/16 1440 07/25/16 1701  PROT 6.9 6.7 6.5   HEMOGLOBIN A1C No components found for: HGA1C,  MPG CARDIAC ENZYMES Lab Results  Component Value Date   CKTOTAL 59 09/07/2012   CKMB 3.2 09/07/2012   TROPONINI <0.03 07/26/2016   TROPONINI <0.03 07/25/2016   TROPONINI <0.03 07/25/2016   BNP No results for input(s): PROBNP  in the last 8760 hours. TSH No results for input(s): TSH in the last 8760 hours. CHOLESTEROL No results for input(s): CHOL in the last 8760 hours.  Scheduled Meds: . allopurinol  100 mg Oral Daily  . amLODipine  2.5 mg Oral Daily  . carvedilol  3.125 mg Oral BID WC  . cholecalciferol  1,000 Units Oral Daily  . collagenase   Topical Daily  . cycloSPORINE  1 drop Both Eyes Daily  . digoxin  0.0625 mg Oral Daily  . furosemide  40 mg Oral BID  . heparin  5,000 Units Subcutaneous Q8H  . multivitamin with minerals  1 tablet Oral Daily  . olopatadine  1 drop Both Eyes Daily  . oxyCODONE  10 mg Oral Q12H  . polyvinyl alcohol  1 drop Both Eyes Daily  . potassium chloride  10 mEq Oral TID  . sacubitril-valsartan  1 tablet Oral BID  . sodium chloride flush   3 mL Intravenous Q12H  . spironolactone  25 mg Oral Daily   Continuous Infusions: PRN Meds:.sodium chloride, acetaminophen, albuterol, guaiFENesin-dextromethorphan, omeprazole, ondansetron (ZOFRAN) IV, sodium chloride flush  Assessment/Plan: Acute on chronic bi-ventricular failure CAD S/P stent in LAD Hypertension Type II DM CKD, III Moderate protein calorie malnutrition Chronic right lower leg wound  Add Oxycodone for pain control. Home in AM if stable.   LOS: 8 days    Orpah CobbAjay Hutson Luft  MD  08/02/2016, 7:12 PM

## 2016-08-02 NOTE — Progress Notes (Signed)
Case discussed in Length of stay meeting, pt is at high risk for readmission and [facility]; [facility]; pt is aware and is agreeable to the plan; Alexis GoodellB Sedrick Tober RN,MHA,BSN [phone]

## 2016-08-02 NOTE — Progress Notes (Signed)
Nutrition Follow-up  DOCUMENTATION CODES:   Non-severe (moderate) malnutrition in context of chronic illness  INTERVENTION:  D/C nutritional supplements Encourage adequate healthful PO intake   NUTRITION DIAGNOSIS:   Inadequate oral intake related to chronic illness, acute illness as evidenced by per patient/family report, moderate depletions of muscle mass, moderate depletion of body fat, moderate to severe fluid accumulation.  Ongoing/improving  GOAL:   Patient will meet greater than or equal to 90% of their needs  Being met  MONITOR:   PO intake, Supplement acceptance, Labs, Weight trends, Skin, I & O's  REASON FOR ASSESSMENT:   Malnutrition Screening Tool    ASSESSMENT:   66 yrs old female with known history  of T2DM, of Bi-Ventricular systolic heart failure has increasing leg edema and shortness of breath. Patient denies dietary non-compliance. She also has right lower leg healing wounds.   Pt states that her appetite is better and she has been eating well. Pt states that she does not need any nutritional supplements. Per order history/nursing notes, pt has been refusing Glucerna Shakes and Pro-stat protein supplement. Pt's weight is now 8 lbs below her reported usual body weight. Per nursing notes, pt has been eating 75 to 100% of meals. RD discussed the importance of adequate healthful PO intake and sodium restriction. Pt states that she thinks she will do fine and discharge and she denies any nutrition questions or education needs at this time.   Labs: low sodium, low chloride, low calcium, high BUN  Diet Order:  Diet Heart Room service appropriate? 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  Estimated Nutritional Needs:   Kcal:  1600-1800  Protein:  75-85 grams  Fluid:  1.7 L/day  EDUCATION NEEDS:   No education needs identified at this time  [place], CSP, LDN Inpatient Clinical Dietitian Pager: [phone] After Hours Pager: [phone]

## 2016-08-02 NOTE — Evaluation (Signed)
Physical Therapy Evaluation Patient Details Name: [name] MRN: [id number] DOB: 1951-06-25 Today's Date: 08/02/2016   History of Present Illness  Pt adm with adm with acute on chronic biventricular heart failure. PMH - chf, mi, cad  Clinical Impression  Pt doing well with mobility and no further PT needed.  Ready for dc from PT standpoint.      Follow Up Recommendations No PT follow up    Equipment Recommendations  None recommended by PT    Recommendations for Other Services       Precautions / Restrictions Precautions Precautions: None Restrictions Weight Bearing Restrictions: No      Mobility  Bed Mobility               General bed mobility comments: Pt up in chair  Transfers Overall transfer level: Modified independent                  Ambulation/Gait Ambulation/Gait assistance: Modified independent (Device/Increase time) Ambulation Distance (Feet): 170 Feet Assistive device: None Gait Pattern/deviations: Step-through pattern;Antalgic Gait velocity: decr Gait velocity interpretation: Below normal speed for age/gender General Gait Details: Slow, steady gait with occasional use of rail. Speed and distance limited by pain.  Stairs            Wheelchair Mobility    Modified Rankin (Stroke Patients Only)       Balance Overall balance assessment: No apparent balance deficits (not formally assessed)                                           Pertinent Vitals/Pain Pain Assessment: 0-10 Pain Score: 7  Pain Location: bil legs Pain Descriptors / Indicators: Sharp Pain Intervention(s): Limited activity within patient's tolerance    Home Living Family/patient expects to be discharged to:: Private residence Living Arrangements: Spouse/significant other Available Help at Discharge: Family;Available 24 hours/day Type of Home: House Home Access: Stairs to enter Entrance Stairs-Rails: Right Entrance Stairs-Number of  Steps: 3 Home Layout: One level Home Equipment: None      Prior Function Level of Independence: Independent               Hand Dominance   Dominant Hand: Right    Extremity/Trunk Assessment   Upper Extremity Assessment Upper Extremity Assessment: Overall WFL for tasks assessed    Lower Extremity Assessment Lower Extremity Assessment: Overall WFL for tasks assessed       Communication   Communication: No difficulties  Cognition Arousal/Alertness: Awake/alert Behavior During Therapy: WFL for tasks assessed/performed Overall Cognitive Status: Within Functional Limits for tasks assessed                      General Comments      Exercises     Assessment/Plan    PT Assessment Patent does not need any further PT services  PT Problem List            PT Treatment Interventions      PT Goals (Current goals can be found in the Care Plan section)  Acute Rehab PT Goals PT Goal Formulation: All assessment and education complete, DC therapy    Frequency     Barriers to discharge        Co-evaluation               End of Session   Activity Tolerance: Patient tolerated treatment  well Patient left: in chair;with call bell/phone within reach Nurse Communication: Mobility status         Time: [id number] PT Time Calculation (min) (ACUTE ONLY): 10 min   Charges:   PT Evaluation $PT Eval Low Complexity: 1 Procedure     PT G CodesAngelina Ok:        Rolande Moe W [facility] 08/02/2016, 9:20 AM [facility] Jessi Jessop PT [phone]

## 2016-08-03 MED ORDER — ALLOPURINOL 100 MG PO TABS: 100.0000 mg | ORAL_TABLET | Freq: Every day | ORAL | Status: AC

## 2016-08-03 MED ORDER — OXYCODONE HCL ER 10 MG PO T12A: 10.0000 mg | EXTENDED_RELEASE_TABLET | Freq: Two times a day (BID) | ORAL | 0 refills | Status: DC | PRN

## 2016-08-03 MED ORDER — AMLODIPINE BESYLATE 5 MG PO TABS: 2.5000 mg | ORAL_TABLET | Freq: Every day | ORAL | Status: DC

## 2016-08-03 MED ORDER — POTASSIUM CHLORIDE CRYS ER 10 MEQ PO TBCR: 10.0000 meq | EXTENDED_RELEASE_TABLET | Freq: Every day | ORAL | Status: AC

## 2016-08-03 NOTE — Discharge Summary (Signed)
Physician Discharge Summary  Patient ID: [name] MRN: [id number] DOB/AGE: 1950/08/27 66 y.o.  Admit date: 07/25/2016 Discharge date: 08/03/2016  Admission Diagnoses: Acute on chronic biventricular systolic failure CAD S/P LAD stent Hypertension Type II DM CKD, III Moderate protein calorie malnutrition Chronic right lower leg swelling and wound  Discharge Diagnoses:  Principal Problem:   Acute on chronic biventricular systolic heart failure, NYHA class 2 (HCC) Active Problems:   CAD   S/P LAD stent   Diabetes mellitus (HCC) type II   Essential hypertension   Chronic right lower leg swelling and wound   Malnutrition of moderate degree   CKD III  Discharged Condition: fair  Hospital Course: 66 year old female with chronic bi-ventricular systolic heart failure had increasing leg edema and shortness of breath. She responded to IV lasix and IV dopamine drip with good diuresis. Patient acknowledged watching diet and activity. Home health nurse will reinforce her care at home. She will see me in 1 week.  Consults: cardiology  Significant Diagnostic Studies: labs: BNP 2602.9 on admission and 1233.3 near day of discharge. Electrolytes were normal, Creatinine was 1.93 on admission and 1.58 on day of discharge. Troponin-I was normal x 3.  EKG: NSR, left atrial enlargement and low voltage.  Chest x-ray: Pulmonary vascular congestion, cardiomegaly and pulmonary venous hypertension.  Treatments: cardiac meds: carvedilol, amlodipine, digoxin, furosemide, potassium, Entresto and metolazone  Discharge Exam: Blood pressure 129/75, pulse 79, temperature 98.7 F (37.1 C), temperature source Oral, resp. rate 16, height 5\' 5"  (1.651 m), weight 60.8 kg (134 lb), SpO2 99 %. General appearance: alert, cooperative and appears stated age Head: Normocephalic, atraumatic. Eyes: Brown eyes, pink conjunctivae/corneas clear. PERRL, EOM's intact.  Neck: no adenopathy, no carotid bruit, no JVD,  supple, symmetrical, trachea midline and thyroid not enlarged. Resp: clear to auscultation bilaterally. Cardio: regular rate and rhythm, S1, S2 normal, II/VI systolic murmur, no click, rub or gallop GI: soft, non-tender; bowel sounds normal; no masses,  no organomegaly Extremities: Trace lower leg edema with right lower leg dressing over healing small wound. Skin: Warm and dry.  Neurologic: Alert and oriented X 3, normal strength and tone. Normal coordination and slow gait.  Disposition: 01-Home or Self Care   Allergies as of 08/03/2016      Reactions   Bidil [isosorb Dinitrate-hydralazine] Itching, Other (See Comments)   Jittery, feels like something is crawling      Medication List    STOP taking these medications   aspirin 81 MG chewable tablet   colchicine 0.6 MG tablet     TAKE these medications   acetaminophen 500 MG tablet Commonly known as:  TYLENOL Take 500 mg by mouth every 6 (six) hours as needed for moderate pain or headache.   albuterol 108 (90 Base) MCG/ACT inhaler Commonly known as:  PROVENTIL HFA;VENTOLIN HFA Inhale 2 puffs into the lungs every 6 (six) hours as needed for wheezing or shortness of breath.   allopurinol 100 MG tablet Commonly known as:  ZYLOPRIM Take 1 tablet (100 mg total) by mouth daily. What changed:  when to take this   amLODipine 5 MG tablet Commonly known as:  NORVASC Take 0.5 tablets (2.5 mg total) by mouth daily. What changed:  how much to take   carvedilol 3.125 MG tablet Commonly known as:  COREG Take 1 tablet (3.125 mg total) by mouth 2 (two) times daily with a meal.   collagenase ointment Commonly known as:  SANTYL Apply topically daily.   cycloSPORINE 0.05 %  ophthalmic emulsion Commonly known as:  RESTASIS Place 1 drop into both eyes daily.   digoxin 0.125 MG tablet Commonly known as:  LANOXIN Take 0.5 tablets (0.0625 mg total) by mouth daily.   furosemide 80 MG tablet Commonly known as:  LASIX Take 1 tablet (80  mg total) by mouth 2 (two) times daily.   metolazone 5 MG tablet Commonly known as:  ZAROXOLYN Take 1 tablet (5 mg total) by mouth every Monday, Wednesday, and Friday.   nitroGLYCERIN 0.4 MG SL tablet Commonly known as:  NITROSTAT Place 0.4 mg under the tongue every 5 (five) minutes as needed for chest pain.   omeprazole 20 MG tablet Commonly known as:  PRILOSEC OTC Take 20 mg by mouth daily as needed (for acid reflux).   oxyCODONE 10 mg 12 hr tablet Commonly known as:  OXYCONTIN Take 1 tablet (10 mg total) by mouth every 12 (twelve) hours as needed.   PAZEO 0.7 % Soln Generic drug:  Olopatadine HCl Place 1 drop into both eyes daily.   potassium chloride 10 MEQ tablet Commonly known as:  K-DUR,KLOR-CON Take 1 tablet (10 mEq total) by mouth daily.   sacubitril-valsartan 24-26 MG Commonly known as:  ENTRESTO Take 1 tablet by mouth 2 (two) times daily.   spironolactone 25 MG tablet Commonly known as:  ALDACTONE Take 25 mg by mouth daily.   SYSTANE ULTRA 0.4-0.3 % Soln Generic drug:  Polyethyl Glycol-Propyl Glycol Place 1 drop into both eyes daily.   Vitamin D3 2000 units Chew Chew 2,000 Units by mouth daily.      Follow-up Information    [facility] Follow up.   Why:  A home health care nurse wii go to your home Contact information: [address] [place] [address] [phone]        [name], MD. Go on 08/10/2016.   Specialty:  Cardiology Why:  @11 :Azzie Roup information: [address] [place] [address] [phone]           Signed: [name] 08/03/2016, 10:07 AM

## 2016-08-03 NOTE — Progress Notes (Signed)
Patient rested well overnight. No complaints of pain. Vitals remained stable. Patient in bed resting.

## 2016-08-03 NOTE — Progress Notes (Signed)
Pt has orders to be discharged. Discharge instructions given and pt has no additional questions at this time. Medication regimen reviewed and pt educated. Pt verbalized understanding and has no additional questions. Telemetry box removed. IV removed and site in good condition. Pt stable and waiting for transportation.   Wilmoth Rasnic RN 

## 2016-09-22 ENCOUNTER — Inpatient Hospital Stay (HOSPITAL_COMMUNITY): Payer: Self-pay

## 2016-09-22 ENCOUNTER — Inpatient Hospital Stay (HOSPITAL_COMMUNITY)
Admission: AD | Admit: 2016-09-22 | Discharge: 2016-10-01 | DRG: 287 | Disposition: A | Discharge status: Home Health | Payer: Self-pay | Source: Ambulatory Visit | Attending: Cardiovascular Disease | Admitting: Cardiovascular Disease

## 2016-09-22 ENCOUNTER — Encounter (HOSPITAL_COMMUNITY): Payer: Self-pay | Admitting: General Practice

## 2016-09-22 DIAGNOSIS — Z6822 Body mass index (BMI) 22.0-22.9, adult: Secondary | ICD-10-CM

## 2016-09-22 DIAGNOSIS — Z87891 Personal history of nicotine dependence: Secondary | ICD-10-CM

## 2016-09-22 DIAGNOSIS — I272 Pulmonary hypertension, unspecified: Secondary | ICD-10-CM | POA: Diagnosis present

## 2016-09-22 DIAGNOSIS — Z955 Presence of coronary angioplasty implant and graft: Secondary | ICD-10-CM

## 2016-09-22 DIAGNOSIS — N183 Chronic kidney disease, stage 3 (moderate): Secondary | ICD-10-CM | POA: Diagnosis present

## 2016-09-22 DIAGNOSIS — E1122 Type 2 diabetes mellitus with diabetic chronic kidney disease: Secondary | ICD-10-CM | POA: Diagnosis present

## 2016-09-22 DIAGNOSIS — I130 Hypertensive heart and chronic kidney disease with heart failure and stage 1 through stage 4 chronic kidney disease, or unspecified chronic kidney disease: Secondary | ICD-10-CM | POA: Diagnosis present

## 2016-09-22 DIAGNOSIS — I5082 Biventricular heart failure: Principal | ICD-10-CM | POA: Diagnosis present

## 2016-09-22 DIAGNOSIS — I5023 Acute on chronic systolic (congestive) heart failure: Secondary | ICD-10-CM | POA: Diagnosis present

## 2016-09-22 DIAGNOSIS — K219 Gastro-esophageal reflux disease without esophagitis: Secondary | ICD-10-CM | POA: Diagnosis present

## 2016-09-22 DIAGNOSIS — I2510 Atherosclerotic heart disease of native coronary artery without angina pectoris: Secondary | ICD-10-CM | POA: Diagnosis present

## 2016-09-22 DIAGNOSIS — E785 Hyperlipidemia, unspecified: Secondary | ICD-10-CM | POA: Diagnosis present

## 2016-09-22 DIAGNOSIS — Z888 Allergy status to other drugs, medicaments and biological substances status: Secondary | ICD-10-CM

## 2016-09-22 DIAGNOSIS — I509 Heart failure, unspecified: Secondary | ICD-10-CM

## 2016-09-22 DIAGNOSIS — E1129 Type 2 diabetes mellitus with other diabetic kidney complication: Secondary | ICD-10-CM

## 2016-09-22 DIAGNOSIS — E7800 Pure hypercholesterolemia, unspecified: Secondary | ICD-10-CM | POA: Diagnosis present

## 2016-09-22 DIAGNOSIS — I10 Essential (primary) hypertension: Secondary | ICD-10-CM | POA: Diagnosis present

## 2016-09-22 DIAGNOSIS — Z79899 Other long term (current) drug therapy: Secondary | ICD-10-CM

## 2016-09-22 DIAGNOSIS — E440 Moderate protein-calorie malnutrition: Secondary | ICD-10-CM | POA: Diagnosis present

## 2016-09-22 DIAGNOSIS — I420 Dilated cardiomyopathy: Secondary | ICD-10-CM | POA: Diagnosis present

## 2016-09-22 LAB — COMPREHENSIVE METABOLIC PANEL
ALT: 13 U/L — ABNORMAL LOW (ref 14–54)
ANION GAP: 17 — AB (ref 5–15)
AST: 24 U/L (ref 15–41)
Albumin: 3.6 g/dL (ref 3.5–5.0)
Alkaline Phosphatase: 71 U/L (ref 38–126)
BUN: 64 mg/dL — ABNORMAL HIGH (ref 6–20)
CHLORIDE: 102 mmol/L (ref 101–111)
CO2: 21 mmol/L — AB (ref 22–32)
Calcium: 9.4 mg/dL (ref 8.9–10.3)
Creatinine, Ser: 2.48 mg/dL — ABNORMAL HIGH (ref 0.44–1.00)
GFR calc Af Amer: 23 mL/min — ABNORMAL LOW (ref >60)
GFR, EST NON AFRICAN AMERICAN: 19 mL/min — AB (ref >60)
Glucose, Bld: 97 mg/dL (ref 65–99)
Potassium: 4.6 mmol/L (ref 3.5–5.1)
SODIUM: 140 mmol/L (ref 135–145)
Total Bilirubin: 3.2 mg/dL — ABNORMAL HIGH (ref 0.3–1.2)
Total Protein: 7.6 g/dL (ref 6.5–8.1)

## 2016-09-22 LAB — CBC WITH DIFFERENTIAL/PLATELET
Basophils Absolute: 0 10*3/uL (ref 0.0–0.1)
Basophils Relative: 0 %
EOS ABS: 0 10*3/uL (ref 0.0–0.7)
EOS PCT: 0 %
HCT: 46.5 % — ABNORMAL HIGH (ref 36.0–46.0)
Hemoglobin: 15.2 g/dL — ABNORMAL HIGH (ref 12.0–15.0)
Lymphocytes Relative: 26 %
Lymphs Abs: 1.3 10*3/uL (ref 0.7–4.0)
MCH: 26.5 pg (ref 26.0–34.0)
MCHC: 32.7 g/dL (ref 30.0–36.0)
MCV: 81 fL (ref 78.0–100.0)
MONOS PCT: 11 %
Monocytes Absolute: 0.5 10*3/uL (ref 0.1–1.0)
Neutro Abs: 3.1 10*3/uL (ref 1.7–7.7)
Neutrophils Relative %: 63 %
PLATELETS: 197 10*3/uL (ref 150–400)
RBC: 5.74 MIL/uL — ABNORMAL HIGH (ref 3.87–5.11)
RDW: 19 % — AB (ref 11.5–15.5)
WBC: 4.9 10*3/uL (ref 4.0–10.5)

## 2016-09-22 LAB — GLUCOSE, CAPILLARY
GLUCOSE-CAPILLARY: 117 mg/dL — AB (ref 65–99)
Glucose-Capillary: 130 mg/dL — ABNORMAL HIGH (ref 65–99)

## 2016-09-22 LAB — TROPONIN I
TROPONIN I: 0.03 ng/mL — AB (ref <0.03)
TROPONIN I: 0.03 ng/mL — AB (ref <0.03)
TROPONIN I: 0.04 ng/mL — AB (ref <0.03)

## 2016-09-22 LAB — BRAIN NATRIURETIC PEPTIDE: B Natriuretic Peptide: 2083.9 pg/mL — ABNORMAL HIGH (ref 0.0–100.0)

## 2016-09-22 MED ORDER — ACETAMINOPHEN 325 MG PO TABS: 650.0000 mg | ORAL_TABLET | ORAL | Status: DC | PRN

## 2016-09-22 MED ORDER — FUROSEMIDE 10 MG/ML IJ SOLN
40.0000 mg | Freq: Two times a day (BID) | INTRAMUSCULAR | Status: DC
  Administered 2016-09-22 – 2016-09-24 (×6): 40 mg via INTRAVENOUS
  Filled 2016-09-22 (×6): qty 4

## 2016-09-22 MED ORDER — DOPAMINE-DEXTROSE 3.2-5 MG/ML-% IV SOLN
2.0000 ug/kg/min | INTRAVENOUS | Status: DC
  Administered 2016-09-22: 5 ug/kg/min via INTRAVENOUS
  Administered 2016-09-25 – 2016-09-29 (×2): 2 ug/kg/min via INTRAVENOUS
  Filled 2016-09-22 (×3): qty 250

## 2016-09-22 MED ORDER — CARVEDILOL 6.25 MG PO TABS
6.2500 mg | ORAL_TABLET | Freq: Two times a day (BID) | ORAL | Status: DC
  Administered 2016-09-23 – 2016-10-01 (×17): 6.25 mg via ORAL
  Filled 2016-09-22 (×11): qty 1
  Filled 2016-09-22: qty 2
  Filled 2016-09-22 (×6): qty 1

## 2016-09-22 MED ORDER — SACUBITRIL-VALSARTAN 24-26 MG PO TABS
1.0000 | ORAL_TABLET | Freq: Two times a day (BID) | ORAL | Status: DC
  Administered 2016-09-22 – 2016-09-24 (×4): 1 via ORAL
  Filled 2016-09-22 (×4): qty 1

## 2016-09-22 MED ORDER — SODIUM CHLORIDE 0.9% FLUSH
3.0000 mL | INTRAVENOUS | Status: DC | PRN
  Administered 2016-09-22: 3 mL via INTRAVENOUS
  Filled 2016-09-22: qty 3

## 2016-09-22 MED ORDER — HEPARIN SODIUM (PORCINE) 5000 UNIT/ML IJ SOLN
5000.0000 [IU] | Freq: Three times a day (TID) | INTRAMUSCULAR | Status: DC
  Administered 2016-09-22 – 2016-09-26 (×7): 5000 [IU] via SUBCUTANEOUS
  Filled 2016-09-22 (×8): qty 1

## 2016-09-22 MED ORDER — ONDANSETRON HCL 4 MG/2ML IJ SOLN: 4.0000 mg | Freq: Four times a day (QID) | INTRAMUSCULAR | Status: DC | PRN

## 2016-09-22 MED ORDER — SPIRONOLACTONE 25 MG PO TABS
25.0000 mg | ORAL_TABLET | Freq: Every day | ORAL | Status: DC
  Administered 2016-09-23 – 2016-10-01 (×8): 25 mg via ORAL
  Filled 2016-09-22 (×9): qty 1

## 2016-09-22 MED ORDER — POTASSIUM CHLORIDE CRYS ER 10 MEQ PO TBCR
10.0000 meq | EXTENDED_RELEASE_TABLET | Freq: Every day | ORAL | Status: DC
  Administered 2016-09-23 – 2016-09-25 (×3): 10 meq via ORAL
  Filled 2016-09-22 (×3): qty 1

## 2016-09-22 MED ORDER — SODIUM CHLORIDE 0.9% FLUSH
3.0000 mL | Freq: Two times a day (BID) | INTRAVENOUS | Status: DC
  Administered 2016-09-22 – 2016-10-01 (×11): 3 mL via INTRAVENOUS

## 2016-09-22 MED ORDER — DIGOXIN 125 MCG PO TABS
0.0625 mg | ORAL_TABLET | Freq: Every day | ORAL | Status: DC
  Administered 2016-09-24 – 2016-10-01 (×8): 0.0625 mg via ORAL
  Filled 2016-09-22 (×9): qty 1

## 2016-09-22 MED ORDER — AMLODIPINE BESYLATE 2.5 MG PO TABS
2.5000 mg | ORAL_TABLET | Freq: Every day | ORAL | Status: DC
  Administered 2016-09-22 – 2016-09-24 (×3): 2.5 mg via ORAL
  Filled 2016-09-22 (×3): qty 1

## 2016-09-22 MED ORDER — ALLOPURINOL 100 MG PO TABS
100.0000 mg | ORAL_TABLET | Freq: Every day | ORAL | Status: DC
  Administered 2016-09-22 – 2016-10-01 (×9): 100 mg via ORAL
  Filled 2016-09-22 (×10): qty 1

## 2016-09-22 MED ORDER — VITAMIN D 1000 UNITS PO TABS
2000.0000 [IU] | ORAL_TABLET | Freq: Every day | ORAL | Status: DC
  Administered 2016-09-23 – 2016-10-01 (×8): 2000 [IU] via ORAL
  Filled 2016-09-22 (×9): qty 2

## 2016-09-22 MED ORDER — SODIUM CHLORIDE 0.9 % IV SOLN
250.0000 mL | INTRAVENOUS | Status: DC | PRN
Start: 2016-09-22 — End: 2016-09-30

## 2016-09-22 MED ORDER — CARVEDILOL 3.125 MG PO TABS
3.1250 mg | ORAL_TABLET | Freq: Two times a day (BID) | ORAL | Status: DC
  Administered 2016-09-22: 3.125 mg via ORAL
  Filled 2016-09-22: qty 1

## 2016-09-22 MED ORDER — OMEPRAZOLE MAGNESIUM 20 MG PO TBEC: 20.0000 mg | DELAYED_RELEASE_TABLET | Freq: Every day | ORAL | Status: DC | PRN

## 2016-09-22 NOTE — Progress Notes (Signed)
Pt is up in the chair eating with no distress.

## 2016-09-22 NOTE — Progress Notes (Signed)
Called Dr. Sharyn LullHarwani about patients troponin from last shift, insulin and CBG orders (patient is diabetic), and increased blood pressures. Verbal orders received from Dr. Sharyn LullHarwani to increase patients coreg, decrease dopamine drip, do not add any hypoglycemics, monitor patient/report morning troponin levels. Called pharmacy and followed orders.

## 2016-09-22 NOTE — Care Management Note (Addendum)
Case Management Note  Patient Details  Name: [name] MRN: [id number] Date of Birth: 1950-11-09  Subjective/Objective:     Admitted with CHF               Action/Plan: Patient known to me from previous admission; PCP is Dr Algie CofferKadakia; has private insurance with Medicaid with prescription drug coverage; lives at home with spouse; CM following for DCP  09/23/2016- CM talked to patient about HHC services, patient was active with [facility] and would like to have them again; [facility]. Abelino DerrickB Catherina Pates RN  Expected Discharge Date:    possibly 09/26/2016              [facility]  CM Consult   Status of Service:  In process, will continue to follow  Reola MosherChandler, Tayvin Preslar L, RN,MHA,BSN [phone] 09/22/2016, 11:22 AM

## 2016-09-22 NOTE — Progress Notes (Signed)
Pt is a direct Admit from Cardiologist, Transported by Family/Friend, SOB on exertion withFluid overload of Leg and lower abdomen, Labs, CRX and IV Lasix ordered.

## 2016-09-22 NOTE — H&P (Signed)
Referring Physician: Orpah CobbAjay Curley Hogen, MD  [name] is an 66 y.o. female.                       Chief Complaint: Shortness of breath and leg edema  HPI: 66 year old female with history of biventricular failure has increasing shortness of breath and bilateral leg edema. Patient claims to follow diet except fluid intake. No fever or cough.  Past Medical History:  Diagnosis Date  . Biventricular heart failure    systolic/notes 07/25/2016  . CHF (congestive heart failure) (HCC)   . Coronary artery disease   . GERD (gastroesophageal reflux disease)   . Gout   . High cholesterol   . Hypertension   . Shortness of breath dyspnea   . Stomach ulcer   . Type II diabetes mellitus (HCC)       Past Surgical History:  Procedure Laterality Date  . CARDIAC CATHETERIZATION  2014  . CORONARY ANGIOPLASTY WITH STENT PLACEMENT  2013  . LEFT AND RIGHT HEART CATHETERIZATION WITH CORONARY ANGIOGRAM N/A 11/25/2011   Procedure: LEFT AND RIGHT HEART CATHETERIZATION WITH CORONARY ANGIOGRAM;  Surgeon: Robynn PaneMohan N Harwani, MD;  Location: [facility] CATH LAB;  Service: Cardiovascular;  Laterality: N/A;  . LEFT AND RIGHT HEART CATHETERIZATION WITH CORONARY ANGIOGRAM N/A 09/07/2012   Procedure: LEFT AND RIGHT HEART CATHETERIZATION WITH CORONARY ANGIOGRAM;  Surgeon: Ricki RodriguezAjay S Alexi Dorminey, MD;  Location: [facility] CATH LAB;  Service: Cardiovascular;  Laterality: N/A;  . PERCUTANEOUS CORONARY STENT INTERVENTION (PCI-S) Right 11/25/2011   Procedure: PERCUTANEOUS CORONARY STENT INTERVENTION (PCI-S);  Surgeon: Robynn PaneMohan N Harwani, MD;  [facility];  Service: Cardiovascular;  Laterality: Right;  . TONSILLECTOMY  1950's    No family history on file. Social History:  reports that she has quit smoking. Her smoking use included Cigarettes. She has a 10.00 pack-year smoking history. She has never used smokeless tobacco. She reports that she does not drink alcohol or use drugs.  Allergies:  Allergies  Allergen Reactions  . Bidil [Isosorb  Dinitrate-Hydralazine] Itching and Other (See Comments)    Jittery, feels like something is crawling    Medications Prior to Admission  Medication Sig Dispense Refill  . acetaminophen (TYLENOL) 500 MG tablet Take 500 mg by mouth every 6 (six) hours as needed for moderate pain or headache.    . allopurinol (ZYLOPRIM) 100 MG tablet Take 1 tablet (100 mg total) by mouth daily.    Marland Kitchen. amLODipine (NORVASC) 5 MG tablet Take 0.5 tablets (2.5 mg total) by mouth daily.    . carvedilol (COREG) 3.125 MG tablet Take 1 tablet (3.125 mg total) by mouth 2 (two) times daily with a meal. 60 tablet 3  . Cholecalciferol (VITAMIN D3) 2000 units CHEW Chew 2,000 Units by mouth daily.     . collagenase (SANTYL) ointment Apply topically daily. (Patient not taking: Reported on 07/25/2016) 15 g 0  . cycloSPORINE (RESTASIS) 0.05 % ophthalmic emulsion Place 1 drop into both eyes daily.    . digoxin (LANOXIN) 0.125 MG tablet Take 0.5 tablets (0.0625 mg total) by mouth daily. (Patient not taking: Reported on 07/25/2016) 15 tablet 1  . furosemide (LASIX) 80 MG tablet Take 1 tablet (80 mg total) by mouth 2 (two) times daily. 60 tablet 3  . metolazone (ZAROXOLYN) 5 MG tablet Take 1 tablet (5 mg total) by mouth every Monday, Wednesday, and Friday. 15 tablet 3  . nitroGLYCERIN (NITROSTAT) 0.4 MG SL tablet Place 0.4 mg under the tongue every  5 (five) minutes as needed for chest pain.    Marland Kitchen Olopatadine HCl (PAZEO) 0.7 % SOLN Place 1 drop into both eyes daily.     Marland Kitchen omeprazole (PRILOSEC OTC) 20 MG tablet Take 20 mg by mouth daily as needed (for acid reflux).    Marland Kitchen oxyCODONE (OXYCONTIN) 10 mg 12 hr tablet Take 1 tablet (10 mg total) by mouth every 12 (twelve) hours as needed. 14 tablet 0  . Polyethyl Glycol-Propyl Glycol (SYSTANE ULTRA) 0.4-0.3 % SOLN Place 1 drop into both eyes daily.    . potassium chloride (K-DUR,KLOR-CON) 10 MEQ tablet Take 1 tablet (10 mEq total) by mouth daily.    . sacubitril-valsartan (ENTRESTO) 24-26 MG Take 1  tablet by mouth 2 (two) times daily. 60 tablet 2  . spironolactone (ALDACTONE) 25 MG tablet Take 25 mg by mouth daily.    . [DISCONTINUED] albuterol (PROVENTIL HFA;VENTOLIN HFA) 108 (90 Base) MCG/ACT inhaler Inhale 2 puffs into the lungs every 6 (six) hours as needed for wheezing or shortness of breath. (Patient not taking: Reported on 07/25/2016) 1 Inhaler 3    No results found for this or any previous visit (from the past 48 hour(s)). No results found.  Review Of Systems Constitutional: No fever, chills , Positive weight gain. Eyes: Vision change, Wears glasses. No discharge or pain.. Ears: No hearing loss, No tinnitus. Respiratory: No asthma, Positive COPD, pneumonias, shortness of breath. No hemoptysis. Cardiovascular: No chest pain, palpitation. Positive leg edema. Gastrointestinal: No nausea, vomiting or diarrhea or constipation. No GI bleed. No hepatitis. Genitourinary: No dysuria, hematuria or kidney stone. No incontinance. Neurological: No headache, stroke or seizures.  Psychiatry: No psych facility admission for anxiety, depression or suicide. No detox. Skin: No rash. Musculoskeletal: Positive joint pain and fibromyalgia. Positive neck pain and back pain. Lymphadenopathy: No lymphadenopathy Hematology: No anemia or easy bruising.   There were no vitals taken for this visit. There is no height or weight on file to calculate BMI. General appearance: alert, cooperative, appears stated age and mild distress Head: Normocephalic, atraumatic. Eyes: Brown eyes, pink conjunctivae/corneas clear. PERRL, EOM's intact.  Neck: no adenopathy, no carotid bruit, + JVD, supple, symmetrical, trachea midline and thyroid not enlarged. Resp: Basal crackles to auscultation bilaterally Cardio: regular rate and rhythm, S1, S2 normal, II/VI systolic murmur, no click, rub or gallop GI: soft, non-tender; bowel sounds normal; no masses,  no organomegaly Extremities:  2 + edema of lower extremities,  atraumatic. No cyanosis. Skin: Warm and dry.  Neurologic: Alert and oriented X 3, normal strength and tone. Normal coordination and slow gait with cane use.   Assessment/Plan Acute on chronic biventricular systolic heart failure Hypertension CAD S/P stent in LAD DM, II  Admit IV lasix. IV dopamine drip.  [name], MD  09/22/2016, 9:59 AM

## 2016-09-23 LAB — BASIC METABOLIC PANEL
Anion gap: 11 (ref 5–15)
BUN: 57 mg/dL — AB (ref 6–20)
CALCIUM: 8.9 mg/dL (ref 8.9–10.3)
CO2: 22 mmol/L (ref 22–32)
Chloride: 105 mmol/L (ref 101–111)
Creatinine, Ser: 1.94 mg/dL — ABNORMAL HIGH (ref 0.44–1.00)
GFR calc Af Amer: 30 mL/min — ABNORMAL LOW (ref >60)
GFR, EST NON AFRICAN AMERICAN: 26 mL/min — AB (ref >60)
GLUCOSE: 127 mg/dL — AB (ref 65–99)
POTASSIUM: 4.1 mmol/L (ref 3.5–5.1)
SODIUM: 138 mmol/L (ref 135–145)

## 2016-09-23 LAB — HIV ANTIBODY (ROUTINE TESTING W REFLEX): HIV Screen 4th Generation wRfx: NONREACTIVE

## 2016-09-23 MED ORDER — HYDROCERIN EX CREA
TOPICAL_CREAM | Freq: Two times a day (BID) | CUTANEOUS | Status: DC
  Administered 2016-09-23 – 2016-10-01 (×16): via TOPICAL
  Filled 2016-09-23 (×2): qty 113

## 2016-09-23 MED ORDER — BACITRACIN ZINC 500 UNIT/GM EX OINT
TOPICAL_OINTMENT | Freq: Every day | CUTANEOUS | Status: DC
  Administered 2016-09-23 – 2016-10-01 (×8): via TOPICAL
  Filled 2016-09-23 (×2): qty 28.35

## 2016-09-23 NOTE — Consult Note (Signed)
WOC Nurse wound consult note Reason for Consult: RLL wound Wound type: partial thickness Pressure Injury POA: N/A Measurement: 0.5cm x 0.75cm x 0.1cm 100% pink bed, no drainage or odor noted. Wound bed:see above Drainage (amount, consistency, odor) see above Periwound: tight with edema nut intact, multiple scars from healed ulcers Dressing procedure/placement/frequency: I have provided nurses with orders for cleansing feet and legs, Eucerin cream to feet, bacitracin, foam, to open area kerlix and ace wrap to RLL. We will not follow, but will remain available to this patient, to nursing, and the medical and/or surgical teams.  Please re-consult if we need to assist further.    Barnett HatterMelinda Amil Bouwman, RN-C, WTA-C Wound Treatment Associate

## 2016-09-23 NOTE — Progress Notes (Signed)
Subjective:  Patient denies any chest pain state breathing has improved leg swelling still persist  Objective:  Vital Signs in the last 24 hours: Temp:  [97.3 F (36.3 C)-98.1 F (36.7 C)] 98.1 F (36.7 C) (03/16 0800) Pulse Rate:  [69-117] 69 (03/16 0956) Resp:  [18] 18 (03/16 0800) BP: (132-175)/(75-108) 138/76 (03/16 0800) SpO2:  [93 %-99 %] 96 % (03/16 0800) Weight:  [152 lb 1.6 oz (69 kg)] 152 lb 1.6 oz (69 kg) (03/16 0531)  Intake/Output from previous day: 03/15 0701 - 03/16 0700 In: 1221.6 [P.O.:1160; I.V.:61.6] Out: 1400 [Urine:1400] Intake/Output from this shift: Total I/O In: 220 [P.O.:220] Out: 100 [Urine:100]  Physical Exam: Neck: no adenopathy, no carotid bruit and supple, symmetrical, trachea midline Lungs: Bibasilar decreased breath sounds with rales noted Heart: regular rate and rhythm, S1, S2 normal and 2/6 systolic murmur and S3 gallop noted Abdomen: soft, non-tender; bowel sounds normal; no masses,  no organomegaly Extremities: No clubbing cyanosis 3+ edema noted  Lab Results:  Recent Labs  09/22/16 1038  WBC 4.9  HGB 15.2*  PLT 197    Recent Labs  09/22/16 1038  NA 140  K 4.6  CL 102  CO2 21*  GLUCOSE 97  BUN 64*  CREATININE 2.48*    Recent Labs  09/22/16 1615 09/22/16 2058  TROPONINI 0.03* 0.04*   Hepatic Function Panel  Recent Labs  09/22/16 1038  PROT 7.6  ALBUMIN 3.6  AST 24  ALT 13*  ALKPHOS 71  BILITOT 3.2*   No results for input(s): CHOL in the last 72 hours. No results for input(s): PROTIME in the last 72 hours.  Imaging: Imaging results have been reviewed and Dg Chest 2 View  Result Date: 09/22/2016 CLINICAL DATA:  Shortness of Breath EXAM: CHEST  2 VIEW COMPARISON:  07/25/2016 FINDINGS: Cardiac shadow remains enlarged. Left anterior descending stent is noted. Aortic calcifications are again noted. No focal infiltrate is seen. Chronic blunting of the left costophrenic angle is seen. No bony abnormality is noted.  IMPRESSION: Chronic blunting of left costophrenic angle. No acute abnormality is noted. Electronically Signed   By: Alcide CleverMark  Lukens M.D.   On: 09/22/2016 11:58    Cardiac Studies:  Assessment/Plan:  Acute on chronic decompensated systolic congestive heart failure Coronary artery disease status post PCI to LAD in the past Hypertension Diabetes mellitus Acute on chronic kidney disease stage III Protein calorie malnutrition GERD Hyperlipidemia Plan As per orders Check labs Restrict fluid to 1 L per 24 hours Strict INO's Daily weights  LOS: 1 day    Rinaldo CloudHarwani, Aisa Schoeppner 09/23/2016, 11:39 AM

## 2016-09-24 LAB — BASIC METABOLIC PANEL
ANION GAP: 11 (ref 5–15)
BUN: 52 mg/dL — ABNORMAL HIGH (ref 6–20)
CALCIUM: 8.7 mg/dL — AB (ref 8.9–10.3)
CO2: 23 mmol/L (ref 22–32)
CREATININE: 1.76 mg/dL — AB (ref 0.44–1.00)
Chloride: 104 mmol/L (ref 101–111)
GFR, EST AFRICAN AMERICAN: 34 mL/min — AB (ref >60)
GFR, EST NON AFRICAN AMERICAN: 29 mL/min — AB (ref >60)
Glucose, Bld: 139 mg/dL — ABNORMAL HIGH (ref 65–99)
Potassium: 3.7 mmol/L (ref 3.5–5.1)
SODIUM: 138 mmol/L (ref 135–145)

## 2016-09-24 MED ORDER — SACUBITRIL-VALSARTAN 49-51 MG PO TABS
1.0000 | ORAL_TABLET | Freq: Two times a day (BID) | ORAL | Status: DC
  Administered 2016-09-24 – 2016-10-01 (×13): 1 via ORAL
  Filled 2016-09-24 (×14): qty 1

## 2016-09-24 NOTE — Progress Notes (Signed)
Subjective:  Patient denies any chest pain states breathing as gradually improving leg swelling also improved. Objective:  Vital Signs in the last 24 hours: Temp:  [97.8 F (36.6 C)-98.2 F (36.8 C)] 98 F (36.7 C) (03/17 0500) Pulse Rate:  [62-73] 64 (03/17 1014) Resp:  [18] 18 (03/17 0500) BP: (136-178)/(68-92) 141/68 (03/17 1014) SpO2:  [94 %-96 %] 94 % (03/17 0500) Weight:  [150 lb 11.2 oz (68.4 kg)] 150 lb 11.2 oz (68.4 kg) (03/17 0500)  Intake/Output from previous day: 03/16 0701 - 03/17 0700 In: 440 [P.O.:440] Out: 3250 [Urine:3250] Intake/Output from this shift: Total I/O In: 240 [P.O.:240] Out: 1040 [Urine:1040]  Physical Exam: Neck: JVD - 8 cm above sternal notch, no adenopathy, no carotid bruit and supple, symmetrical, trachea midline Lungs: Decreased breath sound at bases with faint rales Heart: regular rate and rhythm, S1, S2 normal and 2/6 systolic murmur and S3 gallop noted Abdomen: soft, non-tender; bowel sounds normal; no masses,  no organomegaly Extremities: No clubbing cyanosis 3+ edema noted  Lab Results:  Recent Labs  09/22/16 1038  WBC 4.9  HGB 15.2*  PLT 197    Recent Labs  09/23/16 1124 09/24/16 0418  NA 138 138  K 4.1 3.7  CL 105 104  CO2 22 23  GLUCOSE 127* 139*  BUN 57* 52*  CREATININE 1.94* 1.76*    Recent Labs  09/22/16 1615 09/22/16 2058  TROPONINI 0.03* 0.04*   Hepatic Function Panel  Recent Labs  09/22/16 1038  PROT 7.6  ALBUMIN 3.6  AST 24  ALT 13*  ALKPHOS 71  BILITOT 3.2*   No results for input(s): CHOL in the last 72 hours. No results for input(s): PROTIME in the last 72 hours.  Imaging: Imaging results have been reviewed and Dg Chest 2 View  Result Date: 09/22/2016 CLINICAL DATA:  Shortness of Breath EXAM: CHEST  2 VIEW COMPARISON:  07/25/2016 FINDINGS: Cardiac shadow remains enlarged. Left anterior descending stent is noted. Aortic calcifications are again noted. No focal infiltrate is seen. Chronic  blunting of the left costophrenic angle is seen. No bony abnormality is noted. IMPRESSION: Chronic blunting of left costophrenic angle. No acute abnormality is noted. Electronically Signed   By: Alcide CleverMark  Lukens M.D.   On: 09/22/2016 11:58    Cardiac Studies:  Assessment/Plan:  Resolving Acute on chronic decompensated systolic congestive heart failure Coronary artery disease status post PCI to LAD in the past Hypertension Diabetes mellitus Acute on chronic kidney disease stage III improved Protein calorie malnutrition GERD Hyperlipidemia Plan As per orders  LOS: 2 days    [name], [name] 09/24/2016, 11:27 AM

## 2016-09-24 NOTE — Progress Notes (Signed)
Pt slept on and off overnight in a recliner chair (that is comfortable to the patient according to the patient than the bed), Dopamin drip is continue, there is no any sign of SOB and distress noted, pt refused pm heparin, will continue to monitor the patient.

## 2016-09-25 LAB — BASIC METABOLIC PANEL
Anion gap: 9 (ref 5–15)
BUN: 48 mg/dL — AB (ref 6–20)
CALCIUM: 8.3 mg/dL — AB (ref 8.9–10.3)
CO2: 26 mmol/L (ref 22–32)
Chloride: 102 mmol/L (ref 101–111)
Creatinine, Ser: 1.74 mg/dL — ABNORMAL HIGH (ref 0.44–1.00)
GFR calc Af Amer: 35 mL/min — ABNORMAL LOW (ref >60)
GFR, EST NON AFRICAN AMERICAN: 30 mL/min — AB (ref >60)
GLUCOSE: 107 mg/dL — AB (ref 65–99)
POTASSIUM: 3.7 mmol/L (ref 3.5–5.1)
SODIUM: 137 mmol/L (ref 135–145)

## 2016-09-25 LAB — MAGNESIUM: Magnesium: 1.9 mg/dL (ref 1.7–2.4)

## 2016-09-25 LAB — BRAIN NATRIURETIC PEPTIDE: B Natriuretic Peptide: 1713.1 pg/mL — ABNORMAL HIGH (ref 0.0–100.0)

## 2016-09-25 MED ORDER — FUROSEMIDE 10 MG/ML IJ SOLN: 60.0000 mg | Freq: Two times a day (BID) | INTRAMUSCULAR | Status: DC

## 2016-09-25 MED ORDER — FUROSEMIDE 10 MG/ML IJ SOLN
60.0000 mg | Freq: Two times a day (BID) | INTRAMUSCULAR | Status: DC
  Administered 2016-09-25 – 2016-09-29 (×9): 60 mg via INTRAVENOUS
  Filled 2016-09-25 (×10): qty 6

## 2016-09-25 NOTE — Progress Notes (Signed)
Subjective:  Diuresing well denies any chest pain states breathing has improved and leg swelling also gradually going down.  Objective:  Vital Signs in the last 24 hours: Temp:  [97.4 F (36.3 C)-97.7 F (36.5 C)] 97.4 F (36.3 C) (03/18 0712) Pulse Rate:  [56-60] 60 (03/18 0712) Resp:  [17-18] 18 (03/18 0712) BP: (138-151)/(70-79) 138/73 (03/18 0712) SpO2:  [96 %-100 %] 99 % (03/18 0712) Weight:  [148 lb 6.4 oz (67.3 kg)] 148 lb 6.4 oz (67.3 kg) (03/18 0712)  Intake/Output from previous day: 03/17 0701 - 03/18 0700 In: 600 [P.O.:600] Out: 2760 [Urine:2760] Intake/Output from this shift: No intake/output data recorded.  Physical Exam: Neck: no adenopathy, no carotid bruit and supple, symmetrical, trachea midline Lungs: Decreased breath sound at bases Heart: regular rate and rhythm, S1, S2 normal and 2/6 systolic murmur and S3 gallop noted Abdomen: soft, non-tender; bowel sounds normal; no masses,  no organomegaly Extremities: extremities normal, atraumatic, no cyanosis or edema and 2+ edema noted  Lab Results: No results for input(s): WBC, HGB, PLT in the last 72 hours.  Recent Labs  09/24/16 0418 09/25/16 0428  NA 138 137  K 3.7 3.7  CL 104 102  CO2 23 26  GLUCOSE 139* 107*  BUN 52* 48*  CREATININE 1.76* 1.74*    Recent Labs  09/22/16 1615 09/22/16 2058  TROPONINI 0.03* 0.04*   Hepatic Function Panel No results for input(s): PROT, ALBUMIN, AST, ALT, ALKPHOS, BILITOT, BILIDIR, IBILI in the last 72 hours. No results for input(s): CHOL in the last 72 hours. No results for input(s): PROTIME in the last 72 hours.  Imaging: Imaging results have been reviewed and No results found.  Cardiac Studies:  Assessment/Plan:  Resolving Acute on chronic decompensated systolic congestive heart failure Coronary artery disease status post PCI to LAD in the past Hypertension Diabetes mellitus Acute on chronic kidney disease stage III improved Protein calorie  malnutrition GERD Hyperlipidemia Plan Increase Lasix as per orders   LOS: 3 days    [name], [name] 09/25/2016, 11:02 AM

## 2016-09-25 NOTE — Progress Notes (Signed)
Pt was concerned about her IV Dopamin about the time period, IV got changed, pt slept on and off overnight, no any specific complain of pain and SOB noted, will continue to monitor the patient  [facility] HospitalRekha

## 2016-09-26 LAB — BASIC METABOLIC PANEL
ANION GAP: 10 (ref 5–15)
BUN: 45 mg/dL — ABNORMAL HIGH (ref 6–20)
CO2: 25 mmol/L (ref 22–32)
Calcium: 8 mg/dL — ABNORMAL LOW (ref 8.9–10.3)
Chloride: 102 mmol/L (ref 101–111)
Creatinine, Ser: 1.72 mg/dL — ABNORMAL HIGH (ref 0.44–1.00)
GFR, EST AFRICAN AMERICAN: 35 mL/min — AB (ref >60)
GFR, EST NON AFRICAN AMERICAN: 30 mL/min — AB (ref >60)
GLUCOSE: 154 mg/dL — AB (ref 65–99)
POTASSIUM: 3.5 mmol/L (ref 3.5–5.1)
Sodium: 137 mmol/L (ref 135–145)

## 2016-09-26 MED ORDER — POTASSIUM CHLORIDE CRYS ER 10 MEQ PO TBCR
10.0000 meq | EXTENDED_RELEASE_TABLET | Freq: Three times a day (TID) | ORAL | Status: DC
  Administered 2016-09-26 – 2016-09-28 (×8): 10 meq via ORAL
  Filled 2016-09-26 (×7): qty 1

## 2016-09-26 NOTE — Progress Notes (Signed)
Pt is sitting in chair sleep, Iv drips infusing, wound care done, with no  distress or complaints.

## 2016-09-26 NOTE — Progress Notes (Signed)
Ref: [name], MD   Subjective:  Feeling better. Pre-sacral edema persist as well as lower leg edema.  Objective:  Vital Signs in the last 24 hours: Temp:  [97.5 F (36.4 C)-98.1 F (36.7 C)] 98.1 F (36.7 C) (03/19 0551) Pulse Rate:  [52-61] 61 (03/19 0640) Cardiac Rhythm: Sinus bradycardia (03/19 0700) Resp:  [18] 18 (03/19 0551) BP: (119-142)/(53-71) 142/71 (03/19 0551) SpO2:  [100 %] 100 % (03/19 0551) Weight:  [67.6 kg (149 lb)] 67.6 kg (149 lb) (03/19 0551)  Physical Exam: BP Readings from Last 1 Encounters:  09/26/16 (!) 142/71    Wt Readings from Last 1 Encounters:  09/26/16 67.6 kg (149 lb)    Weight change:  Body mass index is 24.79 kg/m. HEENT: [place]/AT, Eyes-Brown, PERL, EOMI, Conjunctiva-Pink, Sclera-Non-icteric Neck: No JVD, No bruit, Trachea midline. Lungs:  Clearing, Bilateral. Cardiac:  Regular rhythm, normal S1 and S2, no S3. II/VI systolic murmur. Abdomen:  Soft, non-tender. BS present. Extremities:  2 + edema present, both lower leg and pre-sacral area. No cyanosis. No clubbing. CNS: AxOx3, Cranial nerves grossly intact, moves all 4 extremities.  Skin: Warm and dry.   Intake/Output from previous day: 03/18 0701 - 03/19 0700 In: 437.3 [P.O.:420; I.V.:17.3] Out: 1800 [Urine:1800]    Lab Results: BMET    Component Value Date/Time   NA 137 09/26/2016 0408   NA 137 09/25/2016 0428   NA 138 09/24/2016 0418   K 3.5 09/26/2016 0408   K 3.7 09/25/2016 0428   K 3.7 09/24/2016 0418   CL 102 09/26/2016 0408   CL 102 09/25/2016 0428   CL 104 09/24/2016 0418   CO2 25 09/26/2016 0408   CO2 26 09/25/2016 0428   CO2 23 09/24/2016 0418   GLUCOSE 154 (H) 09/26/2016 0408   GLUCOSE 107 (H) 09/25/2016 0428   GLUCOSE 139 (H) 09/24/2016 0418   BUN 45 (H) 09/26/2016 0408   BUN 48 (H) 09/25/2016 0428   BUN 52 (H) 09/24/2016 0418   CREATININE 1.72 (H) 09/26/2016 0408   CREATININE 1.74 (H) 09/25/2016 0428   CREATININE 1.76 (H) 09/24/2016 0418   CALCIUM  8.0 (L) 09/26/2016 0408   CALCIUM 8.3 (L) 09/25/2016 0428   CALCIUM 8.7 (L) 09/24/2016 0418   GFRNONAA 30 (L) 09/26/2016 0408   GFRNONAA 30 (L) 09/25/2016 0428   GFRNONAA 29 (L) 09/24/2016 0418   GFRAA 35 (L) 09/26/2016 0408   GFRAA 35 (L) 09/25/2016 0428   GFRAA 34 (L) 09/24/2016 0418   CBC    Component Value Date/Time   WBC 4.9 09/22/2016 1038   RBC 5.74 (H) 09/22/2016 1038   HGB 15.2 (H) 09/22/2016 1038   HCT 46.5 (H) 09/22/2016 1038   PLT 197 09/22/2016 1038   MCV 81.0 09/22/2016 1038   MCH 26.5 09/22/2016 1038   MCHC 32.7 09/22/2016 1038   RDW 19.0 (H) 09/22/2016 1038   LYMPHSABS 1.3 09/22/2016 1038   MONOABS 0.5 09/22/2016 1038   EOSABS 0.0 09/22/2016 1038   BASOSABS 0.0 09/22/2016 1038   HEPATIC Function Panel  Recent Labs  06/13/16 1440 07/25/16 1701 09/22/16 1038  PROT 6.7 6.5 7.6   HEMOGLOBIN A1C No components found for: HGA1C,  MPG CARDIAC ENZYMES Lab Results  Component Value Date   CKTOTAL 59 09/07/2012   CKMB 3.2 09/07/2012   TROPONINI 0.04 (HH) 09/22/2016   TROPONINI 0.03 (HH) 09/22/2016   TROPONINI 0.03 (HH) 09/22/2016   BNP No results for input(s): PROBNP in the last 8760 hours. TSH No results for  input(s): TSH in the last 8760 hours. CHOLESTEROL No results for input(s): CHOL in the last 8760 hours.  Scheduled Meds: . allopurinol  100 mg Oral Daily  . bacitracin   Topical Daily  . carvedilol  6.25 mg Oral BID WC  . cholecalciferol  2,000 Units Oral Daily  . digoxin  0.0625 mg Oral Daily  . furosemide  60 mg Intravenous BID  . heparin  5,000 Units Subcutaneous Q8H  . hydrocerin   Topical BID  . potassium chloride  10 mEq Oral TID  . sacubitril-valsartan  1 tablet Oral BID  . sodium chloride flush  3 mL Intravenous Q12H  . spironolactone  25 mg Oral Daily   Continuous Infusions: . 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   LOS: 4 days    Orpah CobbAjay Daryle Boyington  MD  09/26/2016, 9:55 AM

## 2016-09-26 NOTE — Progress Notes (Signed)
Pt educated about safety and importance of bed alarm during the night however pt refuses to be on bed alarm. Will continue to round on patient.   Siani Utke, RN    

## 2016-09-27 MED ORDER — METOLAZONE 2.5 MG PO TABS
2.5000 mg | ORAL_TABLET | Freq: Every day | ORAL | Status: DC
  Administered 2016-09-27 – 2016-09-28 (×2): 2.5 mg via ORAL
  Filled 2016-09-27 (×2): qty 1

## 2016-09-27 NOTE — Progress Notes (Signed)
Patient with no complaints or concerns during 7pm - 7am shift.  Gregorey Nabor, RN 

## 2016-09-27 NOTE — Progress Notes (Signed)
Ref: Aaima Gaddie S, MD   Subjective:  Slow and steady diuresis. No significant weight change.  Objective:  Vital Signs in the last 24 hours: Temp:  [97.2 F (36.2 C)-98 F (36.7 C)] 98 F (36.7 C) (03/20 0350) Pulse Rate:  [59-61] 61 (03/20 0350) Cardiac Rhythm: Sinus bradycardia (03/20 0742) Resp:  [18] 18 (03/20 0350) BP: (128-153)/(62-78) 139/67 (03/20 0350) SpO2:  [99 %-100 %] 100 % (03/20 0350) Weight:  [67.7 kg (149 lb 3.2 oz)] 67.7 kg (149 lb 3.2 oz) (03/20 0350)  Physical Exam: BP Readings from Last 1 Encounters:  09/27/16 139/67    Wt Readings from Last 1 Encounters:  09/27/16 67.7 kg (149 lb 3.2 oz)    Weight change: 0.363 kg (12.8 oz) Body mass index is 24.83 kg/m. HEENT: [place]/AT, Eyes-Brown, PERL, EOMI, Conjunctiva-Pink, Sclera-Non-icteric Neck: No JVD, No bruit, Trachea midline. Lungs:  Clearing, Bilateral. Cardiac:  Regular rhythm, normal S1 and S2, no S3. II/VI systolic murmur. Abdomen:  Soft, non-tender. BS present. Extremities:  2 + edema present. No cyanosis. No clubbing. CNS: AxOx3, Cranial nerves grossly intact, moves all 4 extremities.  Skin: Warm and dry.   Intake/Output from previous day: 03/19 0701 - 03/20 0700 In: 510.1 [P.O.:480; I.V.:30.1] Out: 2100 [Urine:2100]    Lab Results: BMET    Component Value Date/Time   NA 137 09/26/2016 0408   NA 137 09/25/2016 0428   NA 138 09/24/2016 0418   K 3.5 09/26/2016 0408   K 3.7 09/25/2016 0428   K 3.7 09/24/2016 0418   CL 102 09/26/2016 0408   CL 102 09/25/2016 0428   CL 104 09/24/2016 0418   CO2 25 09/26/2016 0408   CO2 26 09/25/2016 0428   CO2 23 09/24/2016 0418   GLUCOSE 154 (H) 09/26/2016 0408   GLUCOSE 107 (H) 09/25/2016 0428   GLUCOSE 139 (H) 09/24/2016 0418   BUN 45 (H) 09/26/2016 0408   BUN 48 (H) 09/25/2016 0428   BUN 52 (H) 09/24/2016 0418   CREATININE 1.72 (H) 09/26/2016 0408   CREATININE 1.74 (H) 09/25/2016 0428   CREATININE 1.76 (H) 09/24/2016 0418   CALCIUM 8.0 (L)  09/26/2016 0408   CALCIUM 8.3 (L) 09/25/2016 0428   CALCIUM 8.7 (L) 09/24/2016 0418   GFRNONAA 30 (L) 09/26/2016 0408   GFRNONAA 30 (L) 09/25/2016 0428   GFRNONAA 29 (L) 09/24/2016 0418   GFRAA 35 (L) 09/26/2016 0408   GFRAA 35 (L) 09/25/2016 0428   GFRAA 34 (L) 09/24/2016 0418   CBC    Component Value Date/Time   WBC 4.9 09/22/2016 1038   RBC 5.74 (H) 09/22/2016 1038   HGB 15.2 (H) 09/22/2016 1038   HCT 46.5 (H) 09/22/2016 1038   PLT 197 09/22/2016 1038   MCV 81.0 09/22/2016 1038   MCH 26.5 09/22/2016 1038   MCHC 32.7 09/22/2016 1038   RDW 19.0 (H) 09/22/2016 1038   LYMPHSABS 1.3 09/22/2016 1038   MONOABS 0.5 09/22/2016 1038   EOSABS 0.0 09/22/2016 1038   BASOSABS 0.0 09/22/2016 1038   HEPATIC Function Panel  Recent Labs  06/13/16 1440 07/25/16 1701 09/22/16 1038  PROT 6.7 6.5 7.6   HEMOGLOBIN A1C No components found for: HGA1C,  MPG CARDIAC ENZYMES Lab Results  Component Value Date   CKTOTAL 59 09/07/2012   CKMB 3.2 09/07/2012   TROPONINI 0.04 (HH) 09/22/2016   TROPONINI 0.03 (HH) 09/22/2016   TROPONINI 0.03 (HH) 09/22/2016   BNP No results for input(s): PROBNP in the last 8760 hours. TSH No results for  input(s): TSH in the last 8760 hours. CHOLESTEROL No results for input(s): CHOL in the last 8760 hours.  Scheduled Meds: . allopurinol  100 mg Oral Daily  . bacitracin   Topical Daily  . carvedilol  6.25 mg Oral BID WC  . cholecalciferol  2,000 Units Oral Daily  . digoxin  0.0625 mg Oral Daily  . furosemide  60 mg Intravenous BID  . heparin  5,000 Units Subcutaneous Q8H  . hydrocerin   Topical BID  . potassium chloride  10 mEq Oral TID  . sacubitril-valsartan  1 tablet Oral BID  . sodium chloride flush  3 mL Intravenous Q12H  . spironolactone  25 mg Oral Daily   Continuous Infusions: . 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   LOS: 5 days    Orpah CobbAjay Blaize Epple  MD  09/27/2016, 9:56 AM

## 2016-09-28 LAB — BASIC METABOLIC PANEL
Anion gap: 8 (ref 5–15)
BUN: 47 mg/dL — ABNORMAL HIGH (ref 6–20)
CALCIUM: 8.3 mg/dL — AB (ref 8.9–10.3)
CHLORIDE: 101 mmol/L (ref 101–111)
CO2: 27 mmol/L (ref 22–32)
CREATININE: 1.46 mg/dL — AB (ref 0.44–1.00)
GFR calc non Af Amer: 37 mL/min — ABNORMAL LOW (ref >60)
GFR, EST AFRICAN AMERICAN: 43 mL/min — AB (ref >60)
Glucose, Bld: 109 mg/dL — ABNORMAL HIGH (ref 65–99)
Potassium: 4 mmol/L (ref 3.5–5.1)
SODIUM: 136 mmol/L (ref 135–145)

## 2016-09-28 LAB — CBC
HCT: 44.8 % (ref 36.0–46.0)
HEMOGLOBIN: 14.6 g/dL (ref 12.0–15.0)
MCH: 26.2 pg (ref 26.0–34.0)
MCHC: 32.6 g/dL (ref 30.0–36.0)
MCV: 80.3 fL (ref 78.0–100.0)
Platelets: 168 10*3/uL (ref 150–400)
RBC: 5.58 MIL/uL — ABNORMAL HIGH (ref 3.87–5.11)
RDW: 18.4 % — ABNORMAL HIGH (ref 11.5–15.5)
WBC: 3.9 10*3/uL — ABNORMAL LOW (ref 4.0–10.5)

## 2016-09-28 MED ORDER — METOLAZONE 2.5 MG PO TABS: 2.5000 mg | ORAL_TABLET | Freq: Every day | ORAL | Status: DC

## 2016-09-28 MED ORDER — POTASSIUM CHLORIDE CRYS ER 10 MEQ PO TBCR
10.0000 meq | EXTENDED_RELEASE_TABLET | Freq: Two times a day (BID) | ORAL | Status: DC
  Administered 2016-09-29 – 2016-10-01 (×4): 10 meq via ORAL
  Filled 2016-09-28 (×5): qty 1

## 2016-09-28 MED ORDER — AMLODIPINE BESYLATE 5 MG PO TABS
5.0000 mg | ORAL_TABLET | Freq: Every day | ORAL | Status: DC
  Administered 2016-09-28 – 2016-10-01 (×3): 5 mg via ORAL
  Filled 2016-09-28 (×4): qty 1

## 2016-09-28 MED ORDER — METOLAZONE 2.5 MG PO TABS
2.5000 mg | ORAL_TABLET | Freq: Every day | ORAL | Status: DC
  Administered 2016-09-29 – 2016-09-30 (×2): 2.5 mg via ORAL
  Filled 2016-09-28 (×3): qty 1

## 2016-09-28 NOTE — Progress Notes (Signed)
Patient slept most of the night. No complaints of pain or other concerns. Will continue to monitor.  Nafeesa Dils, RN

## 2016-09-28 NOTE — Progress Notes (Signed)
Ref: Tanya Mckinnon S, MD   Subjective:  Slight improvement in diuresis with metolazone use. About 7 pounds of weight loss so far.  Objective:  Vital Signs in the last 24 hours: Temp:  [97.4 F (36.3 C)-98.6 F (37 C)] 97.4 F (36.3 C) (03/21 1307) Pulse Rate:  [56-83] 83 (03/21 1307) Cardiac Rhythm: Normal sinus rhythm (03/21 0700) Resp:  [18-20] 18 (03/21 1307) BP: (126-157)/(56-77) 157/77 (03/21 1307) SpO2:  [98 %-99 %] 99 % (03/21 1307) Weight:  [65.9 kg (145 lb 4.8 oz)] 65.9 kg (145 lb 4.8 oz) (03/21 [id number])  Physical Exam: BP Readings from Last 1 Encounters:  09/28/16 (!) 157/77    Wt Readings from Last 1 Encounters:  09/28/16 65.9 kg (145 lb 4.8 oz)    Weight change: -1.769 kg (-3 lb 14.4 oz) Body mass index is 24.18 kg/m. HEENT: [place]/AT, Eyes-Brown, PERL, EOMI, Conjunctiva-Pink, Sclera-Non-icteric Neck: No JVD, No bruit, Trachea midline. Lungs:  Clear, Bilateral. Cardiac:  Regular rhythm, normal S1 and S2, no S3. II/VI systolic murmur. Abdomen:  Soft, non-tender. BS present. Extremities:  2 + edema present. No cyanosis. No clubbing. CNS: AxOx3, Cranial nerves grossly intact, moves all 4 extremities.  Skin: Warm and dry.   Intake/Output from previous day: 03/20 0701 - 03/21 0700 In: 881.7 [P.O.:820; I.V.:61.7] Out: 2820 [Urine:2820]    Lab Results: BMET    Component Value Date/Time   NA 136 09/28/2016 0532   NA 137 09/26/2016 0408   NA 137 09/25/2016 0428   K 4.0 09/28/2016 0532   K 3.5 09/26/2016 0408   K 3.7 09/25/2016 0428   CL 101 09/28/2016 0532   CL 102 09/26/2016 0408   CL 102 09/25/2016 0428   CO2 27 09/28/2016 0532   CO2 25 09/26/2016 0408   CO2 26 09/25/2016 0428   GLUCOSE 109 (H) 09/28/2016 0532   GLUCOSE 154 (H) 09/26/2016 0408   GLUCOSE 107 (H) 09/25/2016 0428   BUN 47 (H) 09/28/2016 0532   BUN 45 (H) 09/26/2016 0408   BUN 48 (H) 09/25/2016 0428   CREATININE 1.46 (H) 09/28/2016 0532   CREATININE 1.72 (H) 09/26/2016 0408   CREATININE  1.74 (H) 09/25/2016 0428   CALCIUM 8.3 (L) 09/28/2016 0532   CALCIUM 8.0 (L) 09/26/2016 0408   CALCIUM 8.3 (L) 09/25/2016 0428   GFRNONAA 37 (L) 09/28/2016 0532   GFRNONAA 30 (L) 09/26/2016 0408   GFRNONAA 30 (L) 09/25/2016 0428   GFRAA 43 (L) 09/28/2016 0532   GFRAA 35 (L) 09/26/2016 0408   GFRAA 35 (L) 09/25/2016 0428   CBC    Component Value Date/Time   WBC 3.9 (L) 09/28/2016 0532   RBC 5.58 (H) 09/28/2016 0532   HGB 14.6 09/28/2016 0532   HCT 44.8 09/28/2016 0532   PLT 168 09/28/2016 0532   MCV 80.3 09/28/2016 0532   MCH 26.2 09/28/2016 0532   MCHC 32.6 09/28/2016 0532   RDW 18.4 (H) 09/28/2016 0532   LYMPHSABS 1.3 09/22/2016 1038   MONOABS 0.5 09/22/2016 1038   EOSABS 0.0 09/22/2016 1038   BASOSABS 0.0 09/22/2016 1038   HEPATIC Function Panel  Recent Labs  06/13/16 1440 07/25/16 1701 09/22/16 1038  PROT 6.7 6.5 7.6   HEMOGLOBIN A1C No components found for: HGA1C,  MPG CARDIAC ENZYMES Lab Results  Component Value Date   CKTOTAL 59 09/07/2012   CKMB 3.2 09/07/2012   TROPONINI 0.04 (HH) 09/22/2016   TROPONINI 0.03 (HH) 09/22/2016   TROPONINI 0.03 (HH) 09/22/2016   BNP No results for input([name]):  PROBNP in the last 8760 hours. TSH No results for input([name]): TSH in the last 8760 hours. CHOLESTEROL No results for input([name]): CHOL in the last 8760 hours.  Scheduled Meds: . allopurinol  100 mg Oral Daily  . amLODipine  5 mg Oral Daily  . bacitracin   Topical Daily  . carvedilol  6.25 mg Oral BID WC  . cholecalciferol  2,000 Units Oral Daily  . digoxin  0.0625 mg Oral Daily  . furosemide  60 mg Intravenous BID  . heparin  5,000 Units Subcutaneous Q8H  . hydrocerin   Topical BID  . [START ON 09/29/2016] metolazone  2.5 mg Oral Daily  . [START ON 09/29/2016] potassium chloride  10 mEq Oral BID  . sacubitril-valsartan  1 tablet Oral BID  . sodium chloride flush  3 mL Intravenous Q12H  . spironolactone  25 mg Oral Daily   Continuous Infusions: . 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  Consider R + L heart cath in near future.   LOS: 6 days    Tanya Cobb  MD  09/28/2016, 6:22 PM

## 2016-09-29 NOTE — Progress Notes (Signed)
Ref: [name], MD   Subjective:  Feeling better. 10 + pounds of wight loss so far.   Objective:  Vital Signs in the last 24 hours: Temp:  [97.4 F (36.3 C)-98.2 F (36.8 C)] 98.1 F (36.7 C) (03/22 0900) Pulse Rate:  [64-83] 72 (03/22 0900) Cardiac Rhythm: Sinus bradycardia (03/22 0732) Resp:  [18] 18 (03/22 0526) BP: (126-157)/(55-77) 126/55 (03/22 0900) SpO2:  [99 %-100 %] 100 % (03/22 0900) Weight:  [64 kg (141 lb)] 64 kg (141 lb) (03/22 0526)  Physical Exam: BP Readings from Last 1 Encounters:  09/29/16 (!) 126/55    Wt Readings from Last 1 Encounters:  09/29/16 64 kg (141 lb)    Weight change: -1.95 kg (-4 lb 4.8 oz) Body mass index is 23.46 kg/m. HEENT: [place]/AT, Eyes-Brown, PERL, EOMI, Conjunctiva-Pink, Sclera-Non-icteric Neck: No JVD, No bruit, Trachea midline. Lungs:  Clear, Bilateral. Cardiac:  Regular rhythm, normal S1 and S2, no S3. II/VI systolic murmur. Abdomen:  Soft, non-tender. BS present. Extremities:  2 + pre-sacral and lower leg edema present. No cyanosis. No clubbing. CNS: AxOx3, Cranial nerves grossly intact, moves all 4 extremities.  Skin: Warm and dry.   Intake/Output from previous day: 03/21 0701 - 03/22 0700 In: 1321.8 [P.O.:1260; I.V.:61.8] Out: 5920 [Urine:5920]    Lab Results: BMET    Component Value Date/Time   NA 136 09/28/2016 0532   NA 137 09/26/2016 0408   NA 137 09/25/2016 0428   K 4.0 09/28/2016 0532   K 3.5 09/26/2016 0408   K 3.7 09/25/2016 0428   CL 101 09/28/2016 0532   CL 102 09/26/2016 0408   CL 102 09/25/2016 0428   CO2 27 09/28/2016 0532   CO2 25 09/26/2016 0408   CO2 26 09/25/2016 0428   GLUCOSE 109 (H) 09/28/2016 0532   GLUCOSE 154 (H) 09/26/2016 0408   GLUCOSE 107 (H) 09/25/2016 0428   BUN 47 (H) 09/28/2016 0532   BUN 45 (H) 09/26/2016 0408   BUN 48 (H) 09/25/2016 0428   CREATININE 1.46 (H) 09/28/2016 0532   CREATININE 1.72 (H) 09/26/2016 0408   CREATININE 1.74 (H) 09/25/2016 0428   CALCIUM 8.3 (L)  09/28/2016 0532   CALCIUM 8.0 (L) 09/26/2016 0408   CALCIUM 8.3 (L) 09/25/2016 0428   GFRNONAA 37 (L) 09/28/2016 0532   GFRNONAA 30 (L) 09/26/2016 0408   GFRNONAA 30 (L) 09/25/2016 0428   GFRAA 43 (L) 09/28/2016 0532   GFRAA 35 (L) 09/26/2016 0408   GFRAA 35 (L) 09/25/2016 0428   CBC    Component Value Date/Time   WBC 3.9 (L) 09/28/2016 0532   RBC 5.58 (H) 09/28/2016 0532   HGB 14.6 09/28/2016 0532   HCT 44.8 09/28/2016 0532   PLT 168 09/28/2016 0532   MCV 80.3 09/28/2016 0532   MCH 26.2 09/28/2016 0532   MCHC 32.6 09/28/2016 0532   RDW 18.4 (H) 09/28/2016 0532   LYMPHSABS 1.3 09/22/2016 1038   MONOABS 0.5 09/22/2016 1038   EOSABS 0.0 09/22/2016 1038   BASOSABS 0.0 09/22/2016 1038   HEPATIC Function Panel  Recent Labs  06/13/16 1440 07/25/16 1701 09/22/16 1038  PROT 6.7 6.5 7.6   HEMOGLOBIN A1C No components found for: HGA1C,  MPG CARDIAC ENZYMES Lab Results  Component Value Date   CKTOTAL 59 09/07/2012   CKMB 3.2 09/07/2012   TROPONINI 0.04 (HH) 09/22/2016   TROPONINI 0.03 (HH) 09/22/2016   TROPONINI 0.03 (HH) 09/22/2016   BNP No results for input(s): PROBNP in the last 8760 hours. TSH  No results for input(s): TSH in the last 8760 hours. CHOLESTEROL No results for input(s): CHOL in the last 8760 hours.  Scheduled Meds: . allopurinol  100 mg Oral Daily  . amLODipine  5 mg Oral Daily  . bacitracin   Topical Daily  . carvedilol  6.25 mg Oral BID WC  . cholecalciferol  2,000 Units Oral Daily  . digoxin  0.0625 mg Oral Daily  . furosemide  60 mg Intravenous BID  . heparin  5,000 Units Subcutaneous Q8H  . hydrocerin   Topical BID  . metolazone  2.5 mg Oral Daily  . potassium chloride  10 mEq Oral BID  . sacubitril-valsartan  1 tablet Oral BID  . sodium chloride flush  3 mL Intravenous Q12H  . spironolactone  25 mg Oral Daily   Continuous Infusions: . 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   LOS: 7 days    Orpah CobbAjay Karman Veney  MD  09/29/2016, 10:08 AM

## 2016-09-29 NOTE — Progress Notes (Signed)
Patient refusing heparin subq injections.

## 2016-09-29 NOTE — Progress Notes (Signed)
Patient stable throughout the night, no complaints or concerns. Patient has been ambulating and breathing better.

## 2016-09-30 ENCOUNTER — Encounter (HOSPITAL_COMMUNITY)
Admission: AD | Disposition: A | Discharge status: Home Health | Payer: Self-pay | Source: Ambulatory Visit | Attending: Cardiovascular Disease

## 2016-09-30 ENCOUNTER — Encounter (HOSPITAL_COMMUNITY): Payer: Self-pay | Admitting: Cardiovascular Disease

## 2016-09-30 HISTORY — PX: RIGHT/LEFT HEART CATH AND CORONARY ANGIOGRAPHY: CATH118266

## 2016-09-30 LAB — COMPREHENSIVE METABOLIC PANEL
ALBUMIN: 2.6 g/dL — AB (ref 3.5–5.0)
ALT: 12 U/L — ABNORMAL LOW (ref 14–54)
AST: 22 U/L (ref 15–41)
Alkaline Phosphatase: 87 U/L (ref 38–126)
Anion gap: 11 (ref 5–15)
BUN: 47 mg/dL — AB (ref 6–20)
CALCIUM: 8.8 mg/dL — AB (ref 8.9–10.3)
CHLORIDE: 98 mmol/L — AB (ref 101–111)
CO2: 28 mmol/L (ref 22–32)
CREATININE: 1.35 mg/dL — AB (ref 0.44–1.00)
GFR calc Af Amer: 47 mL/min — ABNORMAL LOW (ref >60)
GFR calc non Af Amer: 41 mL/min — ABNORMAL LOW (ref >60)
GLUCOSE: 97 mg/dL (ref 65–99)
Potassium: 3.8 mmol/L (ref 3.5–5.1)
SODIUM: 137 mmol/L (ref 135–145)
Total Bilirubin: 1.8 mg/dL — ABNORMAL HIGH (ref 0.3–1.2)
Total Protein: 6.7 g/dL (ref 6.5–8.1)

## 2016-09-30 LAB — CBC
HCT: 44.8 % (ref 36.0–46.0)
Hemoglobin: 14.8 g/dL (ref 12.0–15.0)
MCH: 26.3 pg (ref 26.0–34.0)
MCHC: 33 g/dL (ref 30.0–36.0)
MCV: 79.6 fL (ref 78.0–100.0)
PLATELETS: 180 10*3/uL (ref 150–400)
RBC: 5.63 MIL/uL — ABNORMAL HIGH (ref 3.87–5.11)
RDW: 18.1 % — AB (ref 11.5–15.5)
WBC: 4 10*3/uL (ref 4.0–10.5)

## 2016-09-30 LAB — POCT I-STAT 3, VENOUS BLOOD GAS (G3P V)
Acid-Base Excess: 3 mmol/L — ABNORMAL HIGH (ref 0.0–2.0)
Bicarbonate: 27.8 mmol/L (ref 20.0–28.0)
O2 Saturation: 72 %
PCO2 VEN: 43.2 mmHg — AB (ref 44.0–60.0)
PO2 VEN: 37 mmHg (ref 32.0–45.0)
TCO2: 29 mmol/L (ref 0–100)
pH, Ven: 7.416 (ref 7.250–7.430)

## 2016-09-30 LAB — PROTIME-INR
INR: 1.14
Prothrombin Time: 14.6 seconds (ref 11.4–15.2)

## 2016-09-30 LAB — GLUCOSE, CAPILLARY: Glucose-Capillary: 91 mg/dL (ref 65–99)

## 2016-09-30 SURGERY — RIGHT/LEFT HEART CATH AND CORONARY ANGIOGRAPHY
Anesthesia: LOCAL

## 2016-09-30 MED ORDER — MIDAZOLAM HCL 2 MG/2ML IJ SOLN
INTRAMUSCULAR | Status: AC
  Filled 2016-09-30: qty 2

## 2016-09-30 MED ORDER — SODIUM CHLORIDE 0.9% FLUSH
3.0000 mL | Freq: Two times a day (BID) | INTRAVENOUS | Status: DC
  Administered 2016-09-30 – 2016-10-01 (×2): 3 mL via INTRAVENOUS

## 2016-09-30 MED ORDER — SODIUM CHLORIDE 0.9 % IV SOLN: 250.0000 mL | INTRAVENOUS | Status: DC | PRN

## 2016-09-30 MED ORDER — SODIUM CHLORIDE 0.9 % WEIGHT BASED INFUSION
3.0000 mL/kg/h | INTRAVENOUS | Status: DC
  Administered 2016-09-30: 30 mL/h via INTRAVENOUS
  Administered 2016-09-30: 3 mL/kg/h via INTRAVENOUS

## 2016-09-30 MED ORDER — MIDAZOLAM HCL 2 MG/2ML IJ SOLN
INTRAMUSCULAR | Status: DC | PRN
  Administered 2016-09-30: 1 mg via INTRAVENOUS

## 2016-09-30 MED ORDER — LIDOCAINE HCL (PF) 1 % IJ SOLN
INTRAMUSCULAR | Status: DC | PRN
  Administered 2016-09-30: 20 mL

## 2016-09-30 MED ORDER — SODIUM CHLORIDE 0.9 % IV SOLN: INTRAVENOUS | Status: AC

## 2016-09-30 MED ORDER — SODIUM CHLORIDE 0.9 % WEIGHT BASED INFUSION: 1.0000 mL/kg/h | INTRAVENOUS | Status: DC

## 2016-09-30 MED ORDER — HEPARIN (PORCINE) IN NACL 2-0.9 UNIT/ML-% IJ SOLN
INTRAMUSCULAR | Status: AC
  Filled 2016-09-30: qty 1000

## 2016-09-30 MED ORDER — HEPARIN (PORCINE) IN NACL 2-0.9 UNIT/ML-% IJ SOLN
INTRAMUSCULAR | Status: DC | PRN
  Administered 2016-09-30: 1000 mL

## 2016-09-30 MED ORDER — IOPAMIDOL (ISOVUE-370) INJECTION 76%
INTRAVENOUS | Status: DC | PRN
  Administered 2016-09-30: 40 mL via INTRA_ARTERIAL

## 2016-09-30 MED ORDER — SODIUM CHLORIDE 0.9% FLUSH: 3.0000 mL | INTRAVENOUS | Status: DC | PRN

## 2016-09-30 MED ORDER — ASPIRIN 81 MG PO CHEW
81.0000 mg | CHEWABLE_TABLET | ORAL | Status: AC
  Administered 2016-09-30: 81 mg via ORAL
  Filled 2016-09-30: qty 1

## 2016-09-30 MED ORDER — SODIUM CHLORIDE 0.9% FLUSH: 3.0000 mL | Freq: Two times a day (BID) | INTRAVENOUS | Status: DC

## 2016-09-30 MED ORDER — LIDOCAINE HCL (PF) 1 % IJ SOLN
INTRAMUSCULAR | Status: AC
  Filled 2016-09-30: qty 30

## 2016-09-30 SURGICAL SUPPLY — 11 items
CATH INFINITI 5FR MULTPACK ANG (CATHETERS) ×2 IMPLANT
CATH SWAN GANZ 7F STRAIGHT (CATHETERS) ×2 IMPLANT
GUIDEWIRE .025 260CM (WIRE) ×2 IMPLANT
KIT HEART LEFT (KITS) ×2 IMPLANT
KIT HEART RIGHT NAMIC (KITS) ×2 IMPLANT
PACK CARDIAC CATHETERIZATION (CUSTOM PROCEDURE TRAY) ×2 IMPLANT
SHEATH PINNACLE 5F 10CM (SHEATH) ×2 IMPLANT
SHEATH PINNACLE 7F 10CM (SHEATH) ×2 IMPLANT
TRANSDUCER W/STOPCOCK (MISCELLANEOUS) ×4 IMPLANT
WIRE EMERALD 3MM-J .025X260CM (WIRE) ×2 IMPLANT
WIRE EMERALD 3MM-J .035X150CM (WIRE) ×2 IMPLANT

## 2016-09-30 NOTE — H&P (View-Only) (Signed)
Ref: [name], MD   Subjective:  Feeling better. 10 + pounds of wight loss so far.   Objective:  Vital Signs in the last 24 hours: Temp:  [97.4 F (36.3 C)-98.2 F (36.8 C)] 98.1 F (36.7 C) (03/22 0900) Pulse Rate:  [64-83] 72 (03/22 0900) Cardiac Rhythm: Sinus bradycardia (03/22 0732) Resp:  [18] 18 (03/22 0526) BP: (126-157)/(55-77) 126/55 (03/22 0900) SpO2:  [99 %-100 %] 100 % (03/22 0900) Weight:  [64 kg (141 lb)] 64 kg (141 lb) (03/22 0526)  Physical Exam: BP Readings from Last 1 Encounters:  09/29/16 (!) 126/55    Wt Readings from Last 1 Encounters:  09/29/16 64 kg (141 lb)    Weight change: -1.95 kg (-4 lb 4.8 oz) Body mass index is 23.46 kg/m. HEENT: [place]/AT, Eyes-Brown, PERL, EOMI, Conjunctiva-Pink, Sclera-Non-icteric Neck: No JVD, No bruit, Trachea midline. Lungs:  Clear, Bilateral. Cardiac:  Regular rhythm, normal S1 and S2, no S3. II/VI systolic murmur. Abdomen:  Soft, non-tender. BS present. Extremities:  2 + pre-sacral and lower leg edema present. No cyanosis. No clubbing. CNS: AxOx3, Cranial nerves grossly intact, moves all 4 extremities.  Skin: Warm and dry.   Intake/Output from previous day: 03/21 0701 - 03/22 0700 In: 1321.8 [P.O.:1260; I.V.:61.8] Out: 5920 [Urine:5920]    Lab Results: BMET    Component Value Date/Time   NA 136 09/28/2016 0532   NA 137 09/26/2016 0408   NA 137 09/25/2016 0428   K 4.0 09/28/2016 0532   K 3.5 09/26/2016 0408   K 3.7 09/25/2016 0428   CL 101 09/28/2016 0532   CL 102 09/26/2016 0408   CL 102 09/25/2016 0428   CO2 27 09/28/2016 0532   CO2 25 09/26/2016 0408   CO2 26 09/25/2016 0428   GLUCOSE 109 (H) 09/28/2016 0532   GLUCOSE 154 (H) 09/26/2016 0408   GLUCOSE 107 (H) 09/25/2016 0428   BUN 47 (H) 09/28/2016 0532   BUN 45 (H) 09/26/2016 0408   BUN 48 (H) 09/25/2016 0428   CREATININE 1.46 (H) 09/28/2016 0532   CREATININE 1.72 (H) 09/26/2016 0408   CREATININE 1.74 (H) 09/25/2016 0428   CALCIUM 8.3 (L)  09/28/2016 0532   CALCIUM 8.0 (L) 09/26/2016 0408   CALCIUM 8.3 (L) 09/25/2016 0428   GFRNONAA 37 (L) 09/28/2016 0532   GFRNONAA 30 (L) 09/26/2016 0408   GFRNONAA 30 (L) 09/25/2016 0428   GFRAA 43 (L) 09/28/2016 0532   GFRAA 35 (L) 09/26/2016 0408   GFRAA 35 (L) 09/25/2016 0428   CBC    Component Value Date/Time   WBC 3.9 (L) 09/28/2016 0532   RBC 5.58 (H) 09/28/2016 0532   HGB 14.6 09/28/2016 0532   HCT 44.8 09/28/2016 0532   PLT 168 09/28/2016 0532   MCV 80.3 09/28/2016 0532   MCH 26.2 09/28/2016 0532   MCHC 32.6 09/28/2016 0532   RDW 18.4 (H) 09/28/2016 0532   LYMPHSABS 1.3 09/22/2016 1038   MONOABS 0.5 09/22/2016 1038   EOSABS 0.0 09/22/2016 1038   BASOSABS 0.0 09/22/2016 1038   HEPATIC Function Panel  Recent Labs  06/13/16 1440 07/25/16 1701 09/22/16 1038  PROT 6.7 6.5 7.6   HEMOGLOBIN A1C No components found for: HGA1C,  MPG CARDIAC ENZYMES Lab Results  Component Value Date   CKTOTAL 59 09/07/2012   CKMB 3.2 09/07/2012   TROPONINI 0.04 (HH) 09/22/2016   TROPONINI 0.03 (HH) 09/22/2016   TROPONINI 0.03 (HH) 09/22/2016   BNP No results for input(s): PROBNP in the last 8760 hours. TSH  No results for input(s): TSH in the last 8760 hours. CHOLESTEROL No results for input(s): CHOL in the last 8760 hours.  Scheduled Meds: . allopurinol  100 mg Oral Daily  . amLODipine  5 mg Oral Daily  . bacitracin   Topical Daily  . carvedilol  6.25 mg Oral BID WC  . cholecalciferol  2,000 Units Oral Daily  . digoxin  0.0625 mg Oral Daily  . furosemide  60 mg Intravenous BID  . heparin  5,000 Units Subcutaneous Q8H  . hydrocerin   Topical BID  . metolazone  2.5 mg Oral Daily  . potassium chloride  10 mEq Oral BID  . sacubitril-valsartan  1 tablet Oral BID  . sodium chloride flush  3 mL Intravenous Q12H  . spironolactone  25 mg Oral Daily   Continuous Infusions: . 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   LOS: 7 days    Orpah CobbAjay Adrik Khim  MD  09/29/2016, 10:08 AM

## 2016-09-30 NOTE — Progress Notes (Signed)
Patient stable throughout the night, has concerns about procedure in the morning. She may be a little anxious about it.

## 2016-09-30 NOTE — Interval H&P Note (Signed)
History and Physical Interval Note:  09/30/2016 9:28 AM  [name]  has presented today for surgery, with the diagnosis of chf  The various methods of treatment have been discussed with the patient and family. After consideration of risks, benefits and other options for treatment, the patient has consented to  Procedure(s): Right/Left Heart Cath and Coronary Angiography (N/A) as a surgical intervention .  The patient's history has been reviewed, patient examined, no change in status, stable for surgery.  I have reviewed the patient's chart and labs.  Questions were answered to the patient's satisfaction.     Adriane Gabbert S

## 2016-09-30 NOTE — Progress Notes (Signed)
Patient gone to cath lab via bed, dopamine drip.

## 2016-09-30 NOTE — Progress Notes (Signed)
Skin tear noted where femoral sheath was removed after heart cath, patient says it happed "when they pulled the needle out". Site is pink due to missing skin. I reinforced the bandage and will continue to monitor.

## 2016-09-30 NOTE — Progress Notes (Signed)
Patient arrives back to 3e02, right groin, site level 0. Vitals WNL.   Held orders released. Pt's daughter at bedside to order her lunch. Pt aware of bedrest until 1530 today.

## 2016-09-30 NOTE — Progress Notes (Signed)
Patient doesn't want help with lower extremity dressing changes, says she washed them and she wants to leave the wound on her right leg open to air.

## 2016-09-30 NOTE — Progress Notes (Signed)
Pt has been sleeping 

## 2016-09-30 NOTE — Progress Notes (Signed)
Patient wants wound on right lower extremity to be open to air.

## 2016-10-01 LAB — CBC
HEMATOCRIT: 42.9 % (ref 36.0–46.0)
HEMOGLOBIN: 14 g/dL (ref 12.0–15.0)
MCH: 26 pg (ref 26.0–34.0)
MCHC: 32.6 g/dL (ref 30.0–36.0)
MCV: 79.6 fL (ref 78.0–100.0)
Platelets: 179 10*3/uL (ref 150–400)
RBC: 5.39 MIL/uL — ABNORMAL HIGH (ref 3.87–5.11)
RDW: 17.8 % — AB (ref 11.5–15.5)
WBC: 4.1 10*3/uL (ref 4.0–10.5)

## 2016-10-01 LAB — BASIC METABOLIC PANEL
ANION GAP: 10 (ref 5–15)
BUN: 52 mg/dL — ABNORMAL HIGH (ref 6–20)
CHLORIDE: 100 mmol/L — AB (ref 101–111)
CO2: 27 mmol/L (ref 22–32)
Calcium: 8.4 mg/dL — ABNORMAL LOW (ref 8.9–10.3)
Creatinine, Ser: 1.43 mg/dL — ABNORMAL HIGH (ref 0.44–1.00)
GFR calc non Af Amer: 38 mL/min — ABNORMAL LOW (ref >60)
GFR, EST AFRICAN AMERICAN: 44 mL/min — AB (ref >60)
GLUCOSE: 77 mg/dL (ref 65–99)
Potassium: 3.9 mmol/L (ref 3.5–5.1)
Sodium: 137 mmol/L (ref 135–145)

## 2016-10-01 MED ORDER — CARVEDILOL 6.25 MG PO TABS: 6.2500 mg | ORAL_TABLET | Freq: Two times a day (BID) | ORAL | 3 refills | Status: AC

## 2016-10-01 MED ORDER — METOLAZONE 5 MG PO TABS: 5.0000 mg | ORAL_TABLET | ORAL | Status: DC

## 2016-10-01 MED ORDER — FUROSEMIDE 80 MG PO TABS: 40.0000 mg | ORAL_TABLET | Freq: Two times a day (BID) | ORAL | Status: DC

## 2016-10-01 MED ORDER — HYDROCERIN EX CREA: 1.0000 "application " | TOPICAL_CREAM | Freq: Two times a day (BID) | CUTANEOUS | 1 refills | Status: DC

## 2016-10-01 MED ORDER — BACITRACIN ZINC 500 UNIT/GM EX OINT: TOPICAL_OINTMENT | Freq: Every day | CUTANEOUS | 0 refills | Status: DC

## 2016-10-01 NOTE — Discharge Summary (Signed)
Physician Discharge Summary  Patient ID: Tanya SpikeMargaret A Anes MRN: [id number] DOB/AGE: 08/12/1950 66 y.o.  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 She had fair diuresis with dopamine drip and lasix dose for 1 week with preservation of renal function. She lost about 18 pounds of weight in 1 week. She underwent right and left heart catheterization with patent LAD stent and mild to moderate multivessel CAD. She takes 81 mg. Aspirin as tolerated. She has agreed to follow heart failure booklet instructions more stricktly. She will see me in 1 week.   Consults: cardiology  Significant Diagnostic Studies: labs: normal electrolytes and BUN 64/Creatinine 2.48. Post diuresis creatinine was down to 1.43. BNP was 2,083.9 on admission and 1,713 four days later. CBC was near normal. Troponin-I was minimally elevated as demand ischemia.  Chest X-ray: Cardiomegaly.  EKG: Sinus rhythm with right axis deviation, septal infarct and right atrial enlargement.  Cardiac catheterization showed multivessel CAD with patent LAD stent.  Treatments: cardiac meds: carvedilol, diltiazem, digoxin, furosemide, spironolactone, sacubitril-valsartan amiodarone and potassium  Discharge Exam: Blood pressure 127/60, pulse 65, temperature 97.8 F (36.6 C),  temperature source Oral, resp. rate 16, height 5\' 5"  (1.651 m), weight 61.1 kg (134 lb 9.6 oz), SpO2 99 %. General appearance: alert, cooperative and appears stated age Head: Normocephalic, atraumatic. Eyes: Brown eyes, pink conjunctivae/corneas clear. PERRL, EOM's intact.  Neck: no adenopathy, no carotid bruit, no JVD, supple, symmetrical, trachea midline and thyroid not enlarged. Resp: clear to auscultation bilaterally. Cardio: regular rate and rhythm, S1, S2 normal, no murmur, click, rub or gallop GI: soft, non-tender; bowel sounds normal; no masses,  no organomegaly Extremities: Trace edema of lower extremities. Skin: Warm and dry.  Neurologic: Alert and oriented X 3, normal strength and tone. Normal coordination and slow gait.  Disposition: 01-Home-Self care   Allergies as of 10/01/2016      Reactions   Bidil [isosorb Dinitrate-hydralazine] Itching, Other (See Comments)   Jittery, feels like something is crawling      Medication List    TAKE these medications   acetaminophen 500 MG tablet Commonly known as:  TYLENOL Take 500 mg by mouth every 6 (six) hours as needed for moderate pain or headache.   allopurinol 100 MG tablet Commonly known as:  ZYLOPRIM Take 1 tablet (100 mg total) by mouth daily.   amLODipine 5 MG tablet Commonly known as:  NORVASC Take 0.5 tablets (2.5 mg total) by mouth daily.   bacitracin ointment Apply topically daily. Start taking on:  10/02/2016   carvedilol 6.25 MG tablet Commonly known as:  COREG Take 1 tablet (6.25 mg total) by mouth 2 (two) times daily with a meal. What changed:  medication strength  how much to take   collagenase ointment Commonly known as:  SANTYL Apply topically daily.   cycloSPORINE 0.05 % ophthalmic emulsion Commonly known as:  RESTASIS Place  1 drop into both eyes daily.   digoxin 0.125 MG tablet Commonly known as:  LANOXIN Take 0.5 tablets (0.0625 mg total) by mouth daily.   furosemide 80 MG  tablet Commonly known as:  LASIX Take 0.5 tablets (40 mg total) by mouth 2 (two) times daily. What changed:  how much to take   hydrocerin Crea Apply 1 application topically 2 (two) times daily.   metolazone 5 MG tablet Commonly known as:  ZAROXOLYN Take 1 tablet (5 mg total) by mouth once a week. What changed:  when to take this   nitroGLYCERIN 0.4 MG SL tablet Commonly known as:  NITROSTAT Place 0.4 mg under the tongue every 5 (five) minutes as needed for chest pain.   omeprazole 20 MG tablet Commonly known as:  PRILOSEC OTC Take 20 mg by mouth daily as needed (for acid reflux).   oxyCODONE 10 mg 12 hr tablet Commonly known as:  OXYCONTIN Take 1 tablet (10 mg total) by mouth every 12 (twelve) hours as needed.   PAZEO 0.7 % Soln Generic drug:  Olopatadine HCl Place 1 drop into both eyes daily.   potassium chloride 10 MEQ tablet Commonly known as:  K-DUR,KLOR-CON Take 1 tablet (10 mEq total) by mouth daily.   sacubitril-valsartan 24-26 MG Commonly known as:  ENTRESTO Take 1 tablet by mouth 2 (two) times daily.   spironolactone 25 MG tablet Commonly known as:  ALDACTONE Take 25 mg by mouth daily.   SYSTANE ULTRA 0.4-0.3 % Soln Generic drug:  Polyethyl Glycol-Propyl Glycol Place 1 drop into both eyes daily.   Vitamin D3 2000 units Chew Chew 2,000 Units by mouth daily.      Follow-up Information    [facility] Follow up.   Why:  A home health care nurse will continue to see you Contact information: [address] [place] [address] [phone]        [name], MD. Schedule an appointment as soon as possible for a visit in 1 week(s).   Specialty:  Cardiology Contact information: [address] [place] [address] [phone]           Signed: [name] 10/01/2016, 11:07 AM

## 2016-10-01 NOTE — Discharge Summary (Signed)
Pt got discharged to home, discharge instructions provided and patient showed understanding to it, IV taken out,Telemonitor DC,pt left unit in wheelchair with all of the belongings accompanied with a friend. 

## 2016-10-01 NOTE — Progress Notes (Signed)
Patient ambulated in hallway, tolerated well 

## 2016-12-14 ENCOUNTER — Inpatient Hospital Stay (HOSPITAL_COMMUNITY): Payer: Medicaid Other

## 2016-12-14 ENCOUNTER — Encounter (HOSPITAL_COMMUNITY): Payer: Self-pay | Admitting: General Practice

## 2016-12-14 ENCOUNTER — Inpatient Hospital Stay (HOSPITAL_COMMUNITY)
Admission: AD | Admit: 2016-12-14 | Discharge: 2016-12-19 | DRG: 291 | Disposition: A | Discharge status: Home Health | Payer: Medicaid Other | Source: Ambulatory Visit | Attending: Cardiovascular Disease | Admitting: Cardiovascular Disease

## 2016-12-14 DIAGNOSIS — Z6823 Body mass index (BMI) 23.0-23.9, adult: Secondary | ICD-10-CM | POA: Diagnosis not present

## 2016-12-14 DIAGNOSIS — E1122 Type 2 diabetes mellitus with diabetic chronic kidney disease: Secondary | ICD-10-CM | POA: Diagnosis present

## 2016-12-14 DIAGNOSIS — Z9111 Patient's noncompliance with dietary regimen: Secondary | ICD-10-CM | POA: Diagnosis not present

## 2016-12-14 DIAGNOSIS — K219 Gastro-esophageal reflux disease without esophagitis: Secondary | ICD-10-CM | POA: Diagnosis present

## 2016-12-14 DIAGNOSIS — N183 Chronic kidney disease, stage 3 (moderate): Secondary | ICD-10-CM | POA: Diagnosis present

## 2016-12-14 DIAGNOSIS — R000 Tachycardia, unspecified: Secondary | ICD-10-CM | POA: Diagnosis present

## 2016-12-14 DIAGNOSIS — I130 Hypertensive heart and chronic kidney disease with heart failure and stage 1 through stage 4 chronic kidney disease, or unspecified chronic kidney disease: Principal | ICD-10-CM | POA: Diagnosis present

## 2016-12-14 DIAGNOSIS — E440 Moderate protein-calorie malnutrition: Secondary | ICD-10-CM | POA: Diagnosis present

## 2016-12-14 DIAGNOSIS — Z888 Allergy status to other drugs, medicaments and biological substances status: Secondary | ICD-10-CM

## 2016-12-14 DIAGNOSIS — M109 Gout, unspecified: Secondary | ICD-10-CM | POA: Diagnosis present

## 2016-12-14 DIAGNOSIS — Z87891 Personal history of nicotine dependence: Secondary | ICD-10-CM | POA: Diagnosis not present

## 2016-12-14 DIAGNOSIS — I081 Rheumatic disorders of both mitral and tricuspid valves: Secondary | ICD-10-CM | POA: Diagnosis present

## 2016-12-14 DIAGNOSIS — Z79899 Other long term (current) drug therapy: Secondary | ICD-10-CM

## 2016-12-14 DIAGNOSIS — I5023 Acute on chronic systolic (congestive) heart failure: Secondary | ICD-10-CM | POA: Diagnosis present

## 2016-12-14 DIAGNOSIS — Z7982 Long term (current) use of aspirin: Secondary | ICD-10-CM | POA: Diagnosis not present

## 2016-12-14 DIAGNOSIS — Z8711 Personal history of peptic ulcer disease: Secondary | ICD-10-CM

## 2016-12-14 DIAGNOSIS — I5082 Biventricular heart failure: Secondary | ICD-10-CM | POA: Diagnosis present

## 2016-12-14 DIAGNOSIS — I509 Heart failure, unspecified: Secondary | ICD-10-CM

## 2016-12-14 DIAGNOSIS — Z955 Presence of coronary angioplasty implant and graft: Secondary | ICD-10-CM | POA: Diagnosis not present

## 2016-12-14 DIAGNOSIS — I2510 Atherosclerotic heart disease of native coronary artery without angina pectoris: Secondary | ICD-10-CM | POA: Diagnosis present

## 2016-12-14 DIAGNOSIS — I10 Essential (primary) hypertension: Secondary | ICD-10-CM | POA: Diagnosis present

## 2016-12-14 DIAGNOSIS — E1129 Type 2 diabetes mellitus with other diabetic kidney complication: Secondary | ICD-10-CM

## 2016-12-14 LAB — COMPREHENSIVE METABOLIC PANEL
ALK PHOS: 79 U/L (ref 38–126)
ALT: 11 U/L — ABNORMAL LOW (ref 14–54)
ANION GAP: 12 (ref 5–15)
AST: 23 U/L (ref 15–41)
Albumin: 3.6 g/dL (ref 3.5–5.0)
BILIRUBIN TOTAL: 2.8 mg/dL — AB (ref 0.3–1.2)
BUN: 52 mg/dL — ABNORMAL HIGH (ref 6–20)
CALCIUM: 9.3 mg/dL (ref 8.9–10.3)
CO2: 24 mmol/L (ref 22–32)
Chloride: 105 mmol/L (ref 101–111)
Creatinine, Ser: 1.77 mg/dL — ABNORMAL HIGH (ref 0.44–1.00)
GFR calc non Af Amer: 29 mL/min — ABNORMAL LOW (ref >60)
GFR, EST AFRICAN AMERICAN: 34 mL/min — AB (ref >60)
Glucose, Bld: 78 mg/dL (ref 65–99)
Potassium: 4.3 mmol/L (ref 3.5–5.1)
Sodium: 141 mmol/L (ref 135–145)
TOTAL PROTEIN: 7.2 g/dL (ref 6.5–8.1)

## 2016-12-14 LAB — GLUCOSE, CAPILLARY: GLUCOSE-CAPILLARY: 76 mg/dL (ref 65–99)

## 2016-12-14 LAB — CBC WITH DIFFERENTIAL/PLATELET
Basophils Absolute: 0 10*3/uL (ref 0.0–0.1)
Basophils Relative: 0 %
EOS ABS: 0 10*3/uL (ref 0.0–0.7)
Eosinophils Relative: 1 %
HEMATOCRIT: 43.1 % (ref 36.0–46.0)
Hemoglobin: 14.1 g/dL (ref 12.0–15.0)
LYMPHS ABS: 1.5 10*3/uL (ref 0.7–4.0)
Lymphocytes Relative: 33 %
MCH: 27.3 pg (ref 26.0–34.0)
MCHC: 32.7 g/dL (ref 30.0–36.0)
MCV: 83.5 fL (ref 78.0–100.0)
MONO ABS: 0.5 10*3/uL (ref 0.1–1.0)
MONOS PCT: 10 %
NEUTROS PCT: 56 %
Neutro Abs: 2.5 10*3/uL (ref 1.7–7.7)
Platelets: 175 10*3/uL (ref 150–400)
RBC: 5.16 MIL/uL — ABNORMAL HIGH (ref 3.87–5.11)
RDW: 17.6 % — ABNORMAL HIGH (ref 11.5–15.5)
WBC: 4.4 10*3/uL (ref 4.0–10.5)

## 2016-12-14 LAB — TROPONIN I: TROPONIN I: 0.03 ng/mL — AB (ref <0.03)

## 2016-12-14 LAB — BRAIN NATRIURETIC PEPTIDE: B Natriuretic Peptide: 3198.6 pg/mL — ABNORMAL HIGH (ref 0.0–100.0)

## 2016-12-14 MED ORDER — METOLAZONE 5 MG PO TABS
5.0000 mg | ORAL_TABLET | Freq: Every day | ORAL | Status: DC
  Administered 2016-12-14 – 2016-12-15 (×2): 5 mg via ORAL
  Filled 2016-12-14 (×3): qty 1

## 2016-12-14 MED ORDER — FUROSEMIDE 10 MG/ML IJ SOLN
80.0000 mg | Freq: Two times a day (BID) | INTRAMUSCULAR | Status: DC
  Administered 2016-12-14 – 2016-12-16 (×4): 80 mg via INTRAVENOUS
  Filled 2016-12-14 (×4): qty 8

## 2016-12-14 MED ORDER — DOPAMINE-DEXTROSE 3.2-5 MG/ML-% IV SOLN
5.0000 ug/kg/min | INTRAVENOUS | Status: DC
  Administered 2016-12-14 – 2016-12-18 (×3): 5 ug/kg/min via INTRAVENOUS
  Filled 2016-12-14 (×3): qty 250

## 2016-12-14 MED ORDER — SODIUM CHLORIDE 0.9% FLUSH
3.0000 mL | INTRAVENOUS | Status: DC | PRN
Start: 2016-12-14 — End: 2016-12-19

## 2016-12-14 MED ORDER — SODIUM CHLORIDE 0.9% FLUSH
3.0000 mL | Freq: Two times a day (BID) | INTRAVENOUS | Status: DC
  Administered 2016-12-14 – 2016-12-19 (×7): 3 mL via INTRAVENOUS

## 2016-12-14 MED ORDER — SODIUM CHLORIDE 0.9 % IV SOLN: 250.0000 mL | INTRAVENOUS | Status: DC | PRN

## 2016-12-14 MED ORDER — ONDANSETRON HCL 4 MG/2ML IJ SOLN: 4.0000 mg | Freq: Four times a day (QID) | INTRAMUSCULAR | Status: DC | PRN

## 2016-12-14 MED ORDER — HEPARIN SODIUM (PORCINE) 5000 UNIT/ML IJ SOLN
5000.0000 [IU] | Freq: Three times a day (TID) | INTRAMUSCULAR | Status: DC
  Administered 2016-12-14 – 2016-12-15 (×2): 5000 [IU] via SUBCUTANEOUS
  Filled 2016-12-14 (×3): qty 1

## 2016-12-14 MED ORDER — ACETAMINOPHEN 325 MG PO TABS
650.0000 mg | ORAL_TABLET | ORAL | Status: DC | PRN
  Administered 2016-12-16 – 2016-12-18 (×2): 650 mg via ORAL
  Filled 2016-12-14 (×2): qty 2

## 2016-12-14 NOTE — Progress Notes (Signed)
Paged MD regarding pt arrival on unit MD stated he'll place orders  [name]

## 2016-12-14 NOTE — Progress Notes (Signed)
Printed education provided to pt on metazolin   [name] [employer]

## 2016-12-14 NOTE — H&P (Signed)
Referring Physician: Orpah CobbAjay Mercie Balsley, MD  Teddy SpikeMargaret A Lacey is an 66 y.o. female.                       Chief Complaint: Shortness of breath and leg edema  HPI: 66 year old female with known biventricular heart failure and NYHA class II has gradual increase in weight from leg edema, pre-sacral edema and shortness of breath by walking 50 feet. She tried IM lasix per Dr. Jacky KindleAronson but now refuses to take them due to pain, swelling and discharge from injection site. She has PMH of CAD, GERD, GOUT, hypertension, type II DM, prior history of smoking and dietary non-compliance. No fever or cough.  Past Medical History:  Diagnosis Date  . Biventricular heart failure    systolic/notes 07/25/2016  . CHF (congestive heart failure) (HCC)   . Coronary artery disease   . GERD (gastroesophageal reflux disease)   . Gout   . High cholesterol   . Hypertension   . Shortness of breath dyspnea   . Stomach ulcer   . Type II diabetes mellitus (HCC)       Past Surgical History:  Procedure Laterality Date  . CARDIAC CATHETERIZATION  2014  . CORONARY ANGIOPLASTY WITH STENT PLACEMENT  2013  . LEFT AND RIGHT HEART CATHETERIZATION WITH CORONARY ANGIOGRAM N/A 11/25/2011   Procedure: LEFT AND RIGHT HEART CATHETERIZATION WITH CORONARY ANGIOGRAM;  Surgeon: Robynn PaneMohan N Harwani, MD;  Location: [facility] CATH LAB;  Service: Cardiovascular;  Laterality: N/A;  . LEFT AND RIGHT HEART CATHETERIZATION WITH CORONARY ANGIOGRAM N/A 09/07/2012   Procedure: LEFT AND RIGHT HEART CATHETERIZATION WITH CORONARY ANGIOGRAM;  Surgeon: Ricki RodriguezAjay S Aldrich Lloyd, MD;  Location: [facility] CATH LAB;  Service: Cardiovascular;  Laterality: N/A;  . PERCUTANEOUS CORONARY STENT INTERVENTION (PCI-S) Right 11/25/2011   Procedure: PERCUTANEOUS CORONARY STENT INTERVENTION (PCI-S);  Surgeon: Robynn PaneMohan N Harwani, MD;  [facility];  Service: Cardiovascular;  Laterality: Right;  . RIGHT/LEFT HEART CATH AND CORONARY ANGIOGRAPHY N/A 09/30/2016   Procedure: Right/Left Heart Cath and  Coronary Angiography;  Surgeon: Orpah CobbAjay Athanasia Stanwood, MD;  Location: [facility] INVASIVE CV LAB;  Service: Cardiovascular;  Laterality: N/A;  . TONSILLECTOMY  1950's    No family history on file. Social History:  reports that she has quit smoking. Her smoking use included Cigarettes. She has a 10.00 pack-year smoking history. She has never used smokeless tobacco. She reports that she does not drink alcohol or use drugs.  Allergies:  Allergies  Allergen Reactions  . Bidil [Isosorb Dinitrate-Hydralazine] Itching and Other (See Comments)    Jittery, feels like something is crawling    Medications Prior to Admission  Medication Sig Dispense Refill  . acetaminophen (TYLENOL) 500 MG tablet Take 500 mg by mouth every 6 (six) hours as needed for moderate pain or headache.    . albuterol (PROVENTIL HFA;VENTOLIN HFA) 108 (90 Base) MCG/ACT inhaler Inhale 2 puffs into the lungs every 6 (six) hours as needed for wheezing or shortness of breath.    Marland Kitchen. aspirin EC 81 MG tablet Take 81 mg by mouth daily.    . carvedilol (COREG) 6.25 MG tablet Take 1 tablet (6.25 mg total) by mouth 2 (two) times daily with a meal. 60 tablet 3  . Cholecalciferol (VITAMIN D3) 2000 units CHEW Chew 2,000 Units by mouth daily.     . cycloSPORINE (RESTASIS) 0.05 % ophthalmic emulsion Place 1 drop into both eyes daily.    . digoxin (LANOXIN) 0.125 MG tablet Take 0.5 tablets (0.0625  mg total) by mouth daily. 15 tablet 1  . furosemide (LASIX) 80 MG tablet Take 0.5 tablets (40 mg total) by mouth 2 (two) times daily. (Patient taking differently: Take 80-160 mg by mouth See admin instructions. Take 160 mg by mouth in the morning and take 80 mg by mouth in the evening)    . hydrocerin (EUCERIN) CREA Apply 1 application topically 2 (two) times daily. 113 g 1  . Olopatadine HCl (PAZEO) 0.7 % SOLN Place 1 drop into both eyes daily.     Marland Kitchen omeprazole (PRILOSEC OTC) 20 MG tablet Take 20 mg by mouth daily as needed (for acid reflux).    . potassium chloride  (K-DUR,KLOR-CON) 10 MEQ tablet Take 1 tablet (10 mEq total) by mouth daily.    Marland Kitchen allopurinol (ZYLOPRIM) 100 MG tablet Take 1 tablet (100 mg total) by mouth daily. (Patient not taking: Reported on 12/14/2016)    . amLODipine (NORVASC) 5 MG tablet Take 0.5 tablets (2.5 mg total) by mouth daily. (Patient not taking: Reported on 12/14/2016)    . bacitracin ointment Apply topically daily. (Patient not taking: Reported on 12/14/2016) 120 g 0  . collagenase (SANTYL) ointment Apply topically daily. (Patient not taking: Reported on 12/14/2016) 15 g 0  . metolazone (ZAROXOLYN) 5 MG tablet Take 1 tablet (5 mg total) by mouth once a week. (Patient not taking: Reported on 12/14/2016)    . nitroGLYCERIN (NITROSTAT) 0.4 MG SL tablet Place 0.4 mg under the tongue every 5 (five) minutes as needed for chest pain.    Marland Kitchen oxyCODONE (OXYCONTIN) 10 mg 12 hr tablet Take 1 tablet (10 mg total) by mouth every 12 (twelve) hours as needed. (Patient not taking: Reported on 12/14/2016) 14 tablet 0  . sacubitril-valsartan (ENTRESTO) 24-26 MG Take 1 tablet by mouth 2 (two) times daily. (Patient not taking: Reported on 12/14/2016) 60 tablet 2    Results for orders placed or performed during the hospital encounter of 12/14/16 (from the past 48 hour(s))  Glucose, capillary     Status: None   Collection Time: 12/14/16  1:00 PM  Result Value Ref Range   Glucose-Capillary 76 65 - 99 mg/dL   No results found.  Review Of Systems Constitutional: No fever, chills, Positive weight gain. Eyes: No vision change, wears glasses. No discharge or pain. Ears: No hearing loss, No tinnitus. Respiratory: No asthma, Positive COPD, pneumonias, shortness of breath. No hemoptysis. Cardiovascular: Positive chest pain, palpitation, leg edema. Gastrointestinal: Positive nausea, no vomiting, diarrhea, constipation. No GI bleed. No hepatitis. Genitourinary: No dysuria, hematuria, kidney stone. No incontinance. Neurological: No headache, stroke, seizures.   Psychiatry: No psych facility admission for anxiety, depression, suicide. No detox. Skin: No rash. Musculoskeletal: Positive joint pain, fibromyalgia, neck pain and back pain. Lymphadenopathy: No lymphadenopathy. Hematology: No anemia or easy bruising.   Blood pressure (!) 169/88, pulse 77, resp. rate 20, height 5\' 4"  (1.626 m), weight 67.4 kg (148 lb 9.4 oz), SpO2 96 %. Body mass index is 25.51 kg/m. General appearance: alert, cooperative, appears stated age and no distress Head: Normocephalic, atraumatic. Eyes: Brown eyes, pink conjunctiva, corneas clear. PERRL, EOM's intact. Neck: No adenopathy, no carotid bruit, ++ JVD, supple, symmetrical, trachea midline and thyroid not enlarged. Resp: Crackles at bases to auscultation bilaterally. Cardio: Regular rate and rhythm, S1, S2 normal, II/VI systolic murmur, no click, rub or gallop GI: Soft, non-tender; bowel sounds normal; no organomegaly. Extremities: 2 + edema up to knees and pre-sacral areas, no cyanosis or clubbing. Skin: Warm and dry.  Neurologic: Alert and oriented X 3, normal strength. Normal coordination and gait.  Assessment/Plan Acute on chronic biventricular systolic heart failure Hypertension CAD S/P stent in LAD Type II DM Moderate malnutrition Moderate MR Severe TR CKD, III  IV lasix + Inotropic infusion.   [name], MD  12/14/2016, 1:31 PM

## 2016-12-14 NOTE — Progress Notes (Signed)
MD at bedside states to discontinued PICC line order

## 2016-12-14 NOTE — Progress Notes (Signed)
IV called regarding pt PICC placement. Informed that pt has poor veins and was MD order

## 2016-12-15 LAB — BASIC METABOLIC PANEL
Anion gap: 11 (ref 5–15)
BUN: 49 mg/dL — AB (ref 6–20)
CALCIUM: 9.5 mg/dL (ref 8.9–10.3)
CO2: 26 mmol/L (ref 22–32)
CREATININE: 1.73 mg/dL — AB (ref 0.44–1.00)
Chloride: 100 mmol/L — ABNORMAL LOW (ref 101–111)
GFR calc Af Amer: 35 mL/min — ABNORMAL LOW (ref >60)
GFR, EST NON AFRICAN AMERICAN: 30 mL/min — AB (ref >60)
Glucose, Bld: 163 mg/dL — ABNORMAL HIGH (ref 65–99)
POTASSIUM: 3.8 mmol/L (ref 3.5–5.1)
SODIUM: 137 mmol/L (ref 135–145)

## 2016-12-15 LAB — TROPONIN I: TROPONIN I: 0.22 ng/mL — AB (ref <0.03)

## 2016-12-15 MED ORDER — HYDROCORTISONE 1 % EX CREA
1.0000 "application " | TOPICAL_CREAM | Freq: Three times a day (TID) | CUTANEOUS | Status: DC | PRN
  Filled 2016-12-15: qty 28

## 2016-12-15 MED ORDER — POTASSIUM CHLORIDE CRYS ER 20 MEQ PO TBCR
20.0000 meq | EXTENDED_RELEASE_TABLET | Freq: Every day | ORAL | Status: DC
  Administered 2016-12-15 – 2016-12-16 (×2): 20 meq via ORAL
  Filled 2016-12-15 (×2): qty 1

## 2016-12-15 NOTE — Progress Notes (Signed)
Lab reported Troponin of 0.22. Pt denies chest pain. MD paged. No new orders at this time. Will continue to monitor.

## 2016-12-15 NOTE — Progress Notes (Signed)
Ref: Dixie Dials, MD   Subjective:  Good diuresis. No chest pain. Vital signs are stable.  Objective:  Vital Signs in the last 24 hours: Temp:  [98.5 F (36.9 C)-98.8 F (37.1 C)] 98.7 F (37.1 C) (06/07 0820) Pulse Rate:  [80-102] 80 (06/07 1553) Cardiac Rhythm: Normal sinus rhythm (06/07 0707) Resp:  [16-18] 18 (06/07 1553) BP: (114-175)/(68-94) 146/84 (06/07 1553) SpO2:  [91 %-100 %] 100 % (06/07 0820) Weight:  [64.7 kg (142 lb 11.2 oz)] 64.7 kg (142 lb 11.2 oz) (06/07 0174)  Physical Exam: BP Readings from Last 1 Encounters:  12/15/16 (!) 146/84    Wt Readings from Last 1 Encounters:  12/15/16 64.7 kg (142 lb 11.2 oz)    Weight change:  Body mass index is 24.49 kg/m. HEENT: [place]/AT, Eyes-Brown, PERL, EOMI, Conjunctiva-Pink, Sclera-Non-icteric Neck: + JVD, No bruit, Trachea midline. Lungs:  Clear, Bilateral. Cardiac:  Regular rhythm, normal S1 and S2, no S3. II/VI systolic murmur. Abdomen:  Soft, non-tender. BS present. Extremities:  2+ edema of lower leg present. Decreased pre-sacral edema. No cyanosis. No clubbing. CNS: AxOx3, Cranial nerves grossly intact, moves all 4 extremities.  Skin: Warm and dry.   Intake/Output from previous day: 06/06 0701 - 06/07 0700 In: 615.9 [P.O.:600; I.V.:15.9] Out: 5800 [Urine:5800]    Lab Results: BMET    Component Value Date/Time   NA 137 12/15/2016 0123   NA 141 12/14/2016 1348   NA 137 10/01/2016 0414   K 3.8 12/15/2016 0123   K 4.3 12/14/2016 1348   K 3.9 10/01/2016 0414   CL 100 (L) 12/15/2016 0123   CL 105 12/14/2016 1348   CL 100 (L) 10/01/2016 0414   CO2 26 12/15/2016 0123   CO2 24 12/14/2016 1348   CO2 27 10/01/2016 0414   GLUCOSE 163 (H) 12/15/2016 0123   GLUCOSE 78 12/14/2016 1348   GLUCOSE 77 10/01/2016 0414   BUN 49 (H) 12/15/2016 0123   BUN 52 (H) 12/14/2016 1348   BUN 52 (H) 10/01/2016 0414   CREATININE 1.73 (H) 12/15/2016 0123   CREATININE 1.77 (H) 12/14/2016 1348   CREATININE 1.43 (H) 10/01/2016  0414   CALCIUM 9.5 12/15/2016 0123   CALCIUM 9.3 12/14/2016 1348   CALCIUM 8.4 (L) 10/01/2016 0414   GFRNONAA 30 (L) 12/15/2016 0123   GFRNONAA 29 (L) 12/14/2016 1348   GFRNONAA 38 (L) 10/01/2016 0414   GFRAA 35 (L) 12/15/2016 0123   GFRAA 34 (L) 12/14/2016 1348   GFRAA 44 (L) 10/01/2016 0414   CBC    Component Value Date/Time   WBC 4.4 12/14/2016 1348   RBC 5.16 (H) 12/14/2016 1348   HGB 14.1 12/14/2016 1348   HCT 43.1 12/14/2016 1348   PLT 175 12/14/2016 1348   MCV 83.5 12/14/2016 1348   MCH 27.3 12/14/2016 1348   MCHC 32.7 12/14/2016 1348   RDW 17.6 (H) 12/14/2016 1348   LYMPHSABS 1.5 12/14/2016 1348   MONOABS 0.5 12/14/2016 1348   EOSABS 0.0 12/14/2016 1348   BASOSABS 0.0 12/14/2016 1348   HEPATIC Function Panel  Recent Labs  09/22/16 1038 09/30/16 0242 12/14/16 1348  PROT 7.6 6.7 7.2   HEMOGLOBIN A1C No components found for: HGA1C,  MPG CARDIAC ENZYMES Lab Results  Component Value Date   CKTOTAL 59 09/07/2012   CKMB 3.2 09/07/2012   TROPONINI 0.22 (HH) 12/15/2016   TROPONINI 0.03 (HH) 12/14/2016   TROPONINI <0.03 12/14/2016   BNP No results for input(s): PROBNP in the last 8760 hours. TSH No results for  input(s): TSH in the last 8760 hours. CHOLESTEROL No results for input(s): CHOL in the last 8760 hours.  Scheduled Meds: . furosemide  80 mg Intravenous Q12H  . heparin  5,000 Units Subcutaneous Q8H  . metolazone  5 mg Oral Daily  . potassium chloride  20 mEq Oral Daily  . sodium chloride flush  3 mL Intravenous Q12H   Continuous Infusions: . sodium chloride    . DOPamine 5 mcg/kg/min (12/14/16 1429)   PRN Meds:.sodium chloride, acetaminophen, hydrocortisone cream, ondansetron (ZOFRAN) IV, sodium chloride flush  Assessment/Plan: Acute on chronic biventricular systolic heart failure Hypertension Coronary artery disease Status post stent in LAD Type 2 diabetes mellitus Moderate protein calorie malnutrition Moderate MR Severe TR CKD,  III  Continue medical therapy and add potassium supplement. Check be met and magnesium level in the morning.   LOS: 1 day    Dixie Dials  MD  12/15/2016, 7:28 PM

## 2016-12-15 NOTE — Care Management Note (Signed)
Case Management Note  Patient Details  Name: Tanya SpikeMargaret A Verbeke MRN: [id number] Date of Birth: 03/17/1951  Subjective/Objective:    Admitted with CHF               Action/Plan: Patient very well known to me from previous admissions; lives at home with spouse; private insurance with Medicaid with prescription drug coverage; has been follow by [facility] for Disease Management in the past. CM will continue to follow for DCP  Expected Discharge Date:    possibly 12/19/2016              Expected Discharge Plan:  Home w Home Health Services  Discharge planning Services  CM Consult    Status of Service:  In process, will continue to follow  Reola MosherChandler, Tarquin Welcher L, RN,MHA,BSN [phone] 12/15/2016, 12:20 PM

## 2016-12-15 NOTE — Progress Notes (Signed)
[facility]  Patient Status: Active (receiving services up to time of hospitalization)  [facility] is providing the following services: RN and MSW  If patient discharges after hours, please call [phone].   Kizzie FurnishDonna Fellmy 12/15/2016, 9:36 AM

## 2016-12-15 NOTE — Progress Notes (Signed)
Pt applied cream to her lower extremities and requested more. Placed order for hydrocortisone cream. Awaiting delivery from pharmacy  [name] [name]

## 2016-12-16 LAB — BASIC METABOLIC PANEL
ANION GAP: 15 (ref 5–15)
BUN: 46 mg/dL — ABNORMAL HIGH (ref 6–20)
CHLORIDE: 97 mmol/L — AB (ref 101–111)
CO2: 25 mmol/L (ref 22–32)
Calcium: 9.5 mg/dL (ref 8.9–10.3)
Creatinine, Ser: 1.55 mg/dL — ABNORMAL HIGH (ref 0.44–1.00)
GFR calc non Af Amer: 34 mL/min — ABNORMAL LOW (ref >60)
GFR, EST AFRICAN AMERICAN: 40 mL/min — AB (ref >60)
Glucose, Bld: 141 mg/dL — ABNORMAL HIGH (ref 65–99)
POTASSIUM: 4.6 mmol/L (ref 3.5–5.1)
SODIUM: 137 mmol/L (ref 135–145)

## 2016-12-16 LAB — MAGNESIUM: MAGNESIUM: 1.9 mg/dL (ref 1.7–2.4)

## 2016-12-16 MED ORDER — HYDROCERIN EX CREA
1.0000 "application " | TOPICAL_CREAM | Freq: Two times a day (BID) | CUTANEOUS | Status: DC
  Administered 2016-12-16 – 2016-12-19 (×7): 1 via TOPICAL
  Filled 2016-12-16: qty 113

## 2016-12-16 MED ORDER — CYCLOSPORINE 0.05 % OP EMUL
1.0000 [drp] | Freq: Every day | OPHTHALMIC | Status: DC
  Administered 2016-12-16 – 2016-12-19 (×4): 1 [drp] via OPHTHALMIC
  Filled 2016-12-16 (×4): qty 1

## 2016-12-16 MED ORDER — ALLOPURINOL 100 MG PO TABS: 100.0000 mg | ORAL_TABLET | Freq: Every day | ORAL | Status: DC

## 2016-12-16 MED ORDER — FUROSEMIDE 10 MG/ML IJ SOLN
40.0000 mg | Freq: Two times a day (BID) | INTRAMUSCULAR | Status: DC
  Administered 2016-12-16 – 2016-12-17 (×2): 40 mg via INTRAVENOUS
  Filled 2016-12-16 (×2): qty 4

## 2016-12-16 MED ORDER — OMEPRAZOLE MAGNESIUM 20 MG PO TBEC: 20.0000 mg | DELAYED_RELEASE_TABLET | Freq: Every day | ORAL | Status: DC | PRN

## 2016-12-16 MED ORDER — CARVEDILOL 6.25 MG PO TABS
6.2500 mg | ORAL_TABLET | Freq: Two times a day (BID) | ORAL | Status: DC
  Administered 2016-12-17 – 2016-12-18 (×3): 6.25 mg via ORAL
  Filled 2016-12-16 (×4): qty 1

## 2016-12-16 MED ORDER — VITAMIN D 1000 UNITS PO TABS
2000.0000 [IU] | ORAL_TABLET | Freq: Every day | ORAL | Status: DC
  Administered 2016-12-16 – 2016-12-18 (×3): 2000 [IU] via ORAL
  Filled 2016-12-16 (×4): qty 2

## 2016-12-16 MED ORDER — ASPIRIN EC 81 MG PO TBEC
81.0000 mg | DELAYED_RELEASE_TABLET | Freq: Every day | ORAL | Status: DC
  Administered 2016-12-16 – 2016-12-18 (×3): 81 mg via ORAL
  Filled 2016-12-16 (×4): qty 1

## 2016-12-16 MED ORDER — ALBUTEROL SULFATE (2.5 MG/3ML) 0.083% IN NEBU: 2.5000 mg | INHALATION_SOLUTION | Freq: Four times a day (QID) | RESPIRATORY_TRACT | Status: DC | PRN

## 2016-12-16 MED ORDER — NITROGLYCERIN 0.4 MG SL SUBL: 0.4000 mg | SUBLINGUAL_TABLET | SUBLINGUAL | Status: DC | PRN

## 2016-12-16 MED ORDER — OLOPATADINE HCL 0.1 % OP SOLN
1.0000 [drp] | Freq: Every day | OPHTHALMIC | Status: DC
Start: 2016-12-16 — End: 2016-12-19
  Administered 2016-12-16 – 2016-12-19 (×4): 1 [drp] via OPHTHALMIC
  Filled 2016-12-16: qty 5

## 2016-12-16 MED ORDER — LOSARTAN POTASSIUM 50 MG PO TABS
50.0000 mg | ORAL_TABLET | Freq: Every day | ORAL | Status: DC
  Administered 2016-12-16 – 2016-12-17 (×2): 50 mg via ORAL
  Filled 2016-12-16 (×4): qty 1

## 2016-12-16 MED ORDER — POTASSIUM CHLORIDE CRYS ER 10 MEQ PO TBCR: 10.0000 meq | EXTENDED_RELEASE_TABLET | Freq: Every day | ORAL | Status: DC

## 2016-12-16 MED ORDER — DIGOXIN 125 MCG PO TABS
0.0625 mg | ORAL_TABLET | Freq: Every day | ORAL | Status: DC
  Administered 2016-12-16: 0.0625 mg via ORAL
  Filled 2016-12-16 (×4): qty 1

## 2016-12-16 MED ORDER — CARVEDILOL 6.25 MG PO TABS
6.2500 mg | ORAL_TABLET | Freq: Two times a day (BID) | ORAL | Status: DC
  Administered 2016-12-16: 6.25 mg via ORAL
  Filled 2016-12-16: qty 1

## 2016-12-16 MED ORDER — PANTOPRAZOLE SODIUM 40 MG PO TBEC: 40.0000 mg | DELAYED_RELEASE_TABLET | Freq: Every day | ORAL | Status: DC | PRN

## 2016-12-16 NOTE — Progress Notes (Signed)
Ref: Tanya CobbKadakia, Jomarie Gellis, MD   Subjective:  Cramping. Lasix and zaroxolyn too strong. Refused Entresto in past.   Objective:  Vital Signs in the last 24 hours: Temp:  [98 F (36.7 C)-98.4 F (36.9 C)] 98 F (36.7 C) (06/08 0630) Pulse Rate:  [80-92] 92 (06/08 0630) Cardiac Rhythm: Sinus tachycardia (06/08 0700) Resp:  [17-18] 18 (06/08 0630) BP: (146-169)/(84-88) 169/86 (06/08 0630) SpO2:  [94 %-95 %] 94 % (06/08 0630) Weight:  [61.1 kg (134 lb 11.2 oz)] 61.1 kg (134 lb 11.2 oz) (06/08 0630)  Physical Exam: BP Readings from Last 1 Encounters:  12/16/16 (!) 169/86    Wt Readings from Last 1 Encounters:  12/16/16 61.1 kg (134 lb 11.2 oz)    Weight change: -6.3 kg (-13 lb 14.2 oz) Body mass index is 23.12 kg/m. HEENT: [place]/AT, Eyes-Brown, PERL, EOMI, Conjunctiva-Pink, Sclera-Non-icteric Neck: 1+ JVD, No bruit, Trachea midline. Lungs:  Clear, Bilateral. Cardiac:  Regular rhythm, normal S1 and S2, no S3. II/VI systolic murmur. Abdomen:  Soft, non-tender. BS present. Extremities:  1-2 + pre-sacral and lower leg edema present. No cyanosis. No clubbing. CNS: AxOx3, Cranial nerves grossly intact, moves all 4 extremities.  Skin: Warm and dry.   Intake/Output from previous day: 06/07 0701 - 06/08 0700 In: 829.5 [P.O.:600; I.V.:229.5] Out: 6100 [Urine:6100]    Lab Results: BMET    Component Value Date/Time   NA 137 12/16/2016 0347   NA 137 12/15/2016 0123   NA 141 12/14/2016 1348   K 4.6 12/16/2016 0347   K 3.8 12/15/2016 0123   K 4.3 12/14/2016 1348   CL 97 (L) 12/16/2016 0347   CL 100 (L) 12/15/2016 0123   CL 105 12/14/2016 1348   CO2 25 12/16/2016 0347   CO2 26 12/15/2016 0123   CO2 24 12/14/2016 1348   GLUCOSE 141 (H) 12/16/2016 0347   GLUCOSE 163 (H) 12/15/2016 0123   GLUCOSE 78 12/14/2016 1348   BUN 46 (H) 12/16/2016 0347   BUN 49 (H) 12/15/2016 0123   BUN 52 (H) 12/14/2016 1348   CREATININE 1.55 (H) 12/16/2016 0347   CREATININE 1.73 (H) 12/15/2016 0123   CREATININE 1.77 (H) 12/14/2016 1348   CALCIUM 9.5 12/16/2016 0347   CALCIUM 9.5 12/15/2016 0123   CALCIUM 9.3 12/14/2016 1348   GFRNONAA 34 (L) 12/16/2016 0347   GFRNONAA 30 (L) 12/15/2016 0123   GFRNONAA 29 (L) 12/14/2016 1348   GFRAA 40 (L) 12/16/2016 0347   GFRAA 35 (L) 12/15/2016 0123   GFRAA 34 (L) 12/14/2016 1348   CBC    Component Value Date/Time   WBC 4.4 12/14/2016 1348   RBC 5.16 (H) 12/14/2016 1348   HGB 14.1 12/14/2016 1348   HCT 43.1 12/14/2016 1348   PLT 175 12/14/2016 1348   MCV 83.5 12/14/2016 1348   MCH 27.3 12/14/2016 1348   MCHC 32.7 12/14/2016 1348   RDW 17.6 (H) 12/14/2016 1348   LYMPHSABS 1.5 12/14/2016 1348   MONOABS 0.5 12/14/2016 1348   EOSABS 0.0 12/14/2016 1348   BASOSABS 0.0 12/14/2016 1348   HEPATIC Function Panel  Recent Labs  09/22/16 1038 09/30/16 0242 12/14/16 1348  PROT 7.6 6.7 7.2   HEMOGLOBIN A1C No components found for: HGA1C,  MPG CARDIAC ENZYMES Lab Results  Component Value Date   CKTOTAL 59 09/07/2012   CKMB 3.2 09/07/2012   TROPONINI 0.22 (HH) 12/15/2016   TROPONINI 0.03 (HH) 12/14/2016   TROPONINI <0.03 12/14/2016   BNP No results for input(s): PROBNP in the last 8760 hours.  TSH No results for input(s): TSH in the last 8760 hours. CHOLESTEROL No results for input(s): CHOL in the last 8760 hours.  Scheduled Meds: . allopurinol  100 mg Oral Daily  . aspirin EC  81 mg Oral Daily  . carvedilol  6.25 mg Oral BID WC  . cycloSPORINE  1 drop Both Eyes Daily  . digoxin  0.0625 mg Oral Daily  . furosemide  40 mg Intravenous Q12H  . heparin  5,000 Units Subcutaneous Q8H  . hydrocerin  1 application Topical BID  . Olopatadine HCl  1 drop Both Eyes Daily  . [START ON 12/17/2016] potassium chloride  10 mEq Oral Daily  . sodium chloride flush  3 mL Intravenous Q12H  . Vitamin D3  2,000 Units Oral Daily   Continuous Infusions: . sodium chloride    . DOPamine 5 mcg/kg/min (12/16/16 0525)   PRN Meds:.sodium chloride,  acetaminophen, albuterol, hydrocortisone cream, nitroGLYCERIN, omeprazole, ondansetron (ZOFRAN) IV, sodium chloride flush  Assessment/Plan: Acute on chronic biventricular systolic heart failure Hypertension Coronary artery disease Status post stent in LAD Type 2 diabetes mellitus Moderate protein calorie malnutrition Moderate R Severe TR CKD, III  Decrease Lasix dose and hold Zaroxolyn today. Add Losartan 50 mg. daily.   LOS: 2 days    Tanya Cobb  MD  12/16/2016, 9:12 AM

## 2016-12-16 NOTE — Progress Notes (Signed)
MD aware pt refused morning dose of zaroxolyn and complains of cramping. MD at bedside now  Bluford Sedler Elige RadonBradley

## 2016-12-17 LAB — BRAIN NATRIURETIC PEPTIDE: B NATRIURETIC PEPTIDE 5: 1247.7 pg/mL — AB (ref 0.0–100.0)

## 2016-12-17 LAB — BASIC METABOLIC PANEL
Anion gap: 13 (ref 5–15)
BUN: 50 mg/dL — AB (ref 6–20)
CALCIUM: 8.9 mg/dL (ref 8.9–10.3)
CO2: 29 mmol/L (ref 22–32)
CREATININE: 1.58 mg/dL — AB (ref 0.44–1.00)
Chloride: 96 mmol/L — ABNORMAL LOW (ref 101–111)
GFR calc non Af Amer: 34 mL/min — ABNORMAL LOW (ref >60)
GFR, EST AFRICAN AMERICAN: 39 mL/min — AB (ref >60)
Glucose, Bld: 137 mg/dL — ABNORMAL HIGH (ref 65–99)
Potassium: 3.7 mmol/L (ref 3.5–5.1)
SODIUM: 138 mmol/L (ref 135–145)

## 2016-12-17 MED ORDER — FUROSEMIDE 10 MG/ML IJ SOLN
60.0000 mg | Freq: Two times a day (BID) | INTRAMUSCULAR | Status: DC
  Administered 2016-12-17 – 2016-12-18 (×2): 60 mg via INTRAVENOUS
  Filled 2016-12-17 (×2): qty 6

## 2016-12-17 MED ORDER — POTASSIUM CHLORIDE CRYS ER 10 MEQ PO TBCR
10.0000 meq | EXTENDED_RELEASE_TABLET | Freq: Two times a day (BID) | ORAL | Status: DC
  Administered 2016-12-17 – 2016-12-18 (×4): 10 meq via ORAL
  Filled 2016-12-17 (×5): qty 1

## 2016-12-17 MED ORDER — ALLOPURINOL 100 MG PO TABS
100.0000 mg | ORAL_TABLET | Freq: Every day | ORAL | Status: DC
  Administered 2016-12-17 – 2016-12-18 (×2): 100 mg via ORAL
  Filled 2016-12-17 (×3): qty 1

## 2016-12-17 NOTE — Progress Notes (Signed)
Ref: [name], MD   Subjective:  Feeling better. Leg and pre-sacral edema persist but improving.  Objective:  Vital Signs in the last 24 hours: Temp:  [97.5 F (36.4 C)-97.9 F (36.6 C)] 97.5 F (36.4 C) (06/09 0601) Pulse Rate:  [58-93] 58 (06/09 1007) Cardiac Rhythm: Normal sinus rhythm (06/09 0702) Resp:  [17-18] 17 (06/08 2203) BP: (102-165)/(51-87) 112/55 (06/09 1007) SpO2:  [97 %-99 %] 97 % (06/09 1007) Weight:  [59 kg (130 lb 1.6 oz)] 59 kg (130 lb 1.6 oz) (06/09 0601)  Physical Exam: BP Readings from Last 1 Encounters:  12/17/16 (!) 112/55    Wt Readings from Last 1 Encounters:  12/17/16 59 kg (130 lb 1.6 oz)    Weight change: -2.087 kg (-4 lb 9.6 oz) Body mass index is 22.33 kg/m. HEENT: [place]/AT, Eyes-Brown, PERL, EOMI, Conjunctiva-Pink, Sclera-Non-icteric Neck: No JVD, No bruit, Trachea midline. Lungs:  Clearing, Bilateral. Cardiac:  Regular rhythm, normal S1 and S2, no S3. II/VI systolic murmur. Abdomen:  Soft, non-tender. BS present. Extremities:  1 + right and 2 + left lower leg and pre-sacral edema present. No cyanosis. No clubbing. CNS: AxOx3, Cranial nerves grossly intact, moves all 4 extremities.  Skin: Warm and dry.   Intake/Output from previous day: 06/08 0701 - 06/09 0700 In: 774.1 [P.O.:720; I.V.:54.1] Out: 2775 [Urine:2775]    Lab Results: BMET    Component Value Date/Time   NA 138 12/17/2016 0527   NA 137 12/16/2016 0347   NA 137 12/15/2016 0123   K 3.7 12/17/2016 0527   K 4.6 12/16/2016 0347   K 3.8 12/15/2016 0123   CL 96 (L) 12/17/2016 0527   CL 97 (L) 12/16/2016 0347   CL 100 (L) 12/15/2016 0123   CO2 29 12/17/2016 0527   CO2 25 12/16/2016 0347   CO2 26 12/15/2016 0123   GLUCOSE 137 (H) 12/17/2016 0527   GLUCOSE 141 (H) 12/16/2016 0347   GLUCOSE 163 (H) 12/15/2016 0123   BUN 50 (H) 12/17/2016 0527   BUN 46 (H) 12/16/2016 0347   BUN 49 (H) 12/15/2016 0123   CREATININE 1.58 (H) 12/17/2016 0527   CREATININE 1.55 (H)  12/16/2016 0347   CREATININE 1.73 (H) 12/15/2016 0123   CALCIUM 8.9 12/17/2016 0527   CALCIUM 9.5 12/16/2016 0347   CALCIUM 9.5 12/15/2016 0123   GFRNONAA 34 (L) 12/17/2016 0527   GFRNONAA 34 (L) 12/16/2016 0347   GFRNONAA 30 (L) 12/15/2016 0123   GFRAA 39 (L) 12/17/2016 0527   GFRAA 40 (L) 12/16/2016 0347   GFRAA 35 (L) 12/15/2016 0123   CBC    Component Value Date/Time   WBC 4.4 12/14/2016 1348   RBC 5.16 (H) 12/14/2016 1348   HGB 14.1 12/14/2016 1348   HCT 43.1 12/14/2016 1348   PLT 175 12/14/2016 1348   MCV 83.5 12/14/2016 1348   MCH 27.3 12/14/2016 1348   MCHC 32.7 12/14/2016 1348   RDW 17.6 (H) 12/14/2016 1348   LYMPHSABS 1.5 12/14/2016 1348   MONOABS 0.5 12/14/2016 1348   EOSABS 0.0 12/14/2016 1348   BASOSABS 0.0 12/14/2016 1348   HEPATIC Function Panel  Recent Labs  09/22/16 1038 09/30/16 0242 12/14/16 1348  PROT 7.6 6.7 7.2   HEMOGLOBIN A1C No components found for: HGA1C,  MPG CARDIAC ENZYMES Lab Results  Component Value Date   CKTOTAL 59 09/07/2012   CKMB 3.2 09/07/2012   TROPONINI 0.22 (HH) 12/15/2016   TROPONINI 0.03 (HH) 12/14/2016   TROPONINI <0.03 12/14/2016   BNP No results for  input(s): PROBNP in the last 8760 hours. TSH No results for input(s): TSH in the last 8760 hours. CHOLESTEROL No results for input(s): CHOL in the last 8760 hours.  Scheduled Meds: . allopurinol  100 mg Oral Daily  . aspirin EC  81 mg Oral Daily  . carvedilol  6.25 mg Oral BID WC  . cholecalciferol  2,000 Units Oral Daily  . cycloSPORINE  1 drop Both Eyes Daily  . digoxin  0.0625 mg Oral Daily  . furosemide  60 mg Intravenous Q12H  . heparin  5,000 Units Subcutaneous Q8H  . hydrocerin  1 application Topical BID  . losartan  50 mg Oral Daily  . olopatadine  1 drop Both Eyes Daily  . potassium chloride  10 mEq Oral BID  . sodium chloride flush  3 mL Intravenous Q12H   Continuous Infusions: . sodium chloride    . DOPamine 5 mcg/kg/min (12/16/16 0525)   PRN  Meds:.sodium chloride, acetaminophen, albuterol, hydrocortisone cream, nitroGLYCERIN, ondansetron (ZOFRAN) IV, pantoprazole, sodium chloride flush  Assessment/Plan: Acute on chronic biventricular systolic heart failure Hypertension CAD S/P LAD stent Type II DM Moderate protein calorie malnutrition Moderate MR Severe TR CKD, III  Continue diuresis. Monitor creatinine.   LOS: 3 days    Orpah CobbAjay Jennylee Uehara  MD  12/17/2016, 10:50 AM

## 2016-12-17 NOTE — Plan of Care (Signed)
Problem: Physical Regulation: Goal: Ability to maintain clinical measurements within normal limits will improve Outcome: Progressing VSS, heart rate remains between 55 and 65   Problem: Tissue Perfusion: Goal: Risk factors for ineffective tissue perfusion will decrease Outcome: Progressing Maintains oxygen saturation in the 90s on room air   Problem: Fluid Volume: Goal: Ability to maintain a balanced intake and output will improve Outcome: Progressing Continues to diurese with IV lasix   Problem: Education: Goal: Ability to demonstrate managment of disease process will improve Outcome: Progressing Reviewed living better with heart failure booklet, patient verbalized understanding of importance of daily weights and monitoring edema in her legs as well as shortness of breath

## 2016-12-18 LAB — COMPREHENSIVE METABOLIC PANEL
ALBUMIN: 3.3 g/dL — AB (ref 3.5–5.0)
ALK PHOS: 109 U/L (ref 38–126)
ALT: 11 U/L — AB (ref 14–54)
AST: 24 U/L (ref 15–41)
Anion gap: 11 (ref 5–15)
BUN: 54 mg/dL — AB (ref 6–20)
CHLORIDE: 94 mmol/L — AB (ref 101–111)
CO2: 30 mmol/L (ref 22–32)
CREATININE: 1.73 mg/dL — AB (ref 0.44–1.00)
Calcium: 9 mg/dL (ref 8.9–10.3)
GFR calc Af Amer: 35 mL/min — ABNORMAL LOW (ref >60)
GFR calc non Af Amer: 30 mL/min — ABNORMAL LOW (ref >60)
GLUCOSE: 166 mg/dL — AB (ref 65–99)
Potassium: 3.9 mmol/L (ref 3.5–5.1)
SODIUM: 135 mmol/L (ref 135–145)
Total Bilirubin: 1.7 mg/dL — ABNORMAL HIGH (ref 0.3–1.2)
Total Protein: 7 g/dL (ref 6.5–8.1)

## 2016-12-18 MED ORDER — FUROSEMIDE 80 MG PO TABS
80.0000 mg | ORAL_TABLET | Freq: Every day | ORAL | Status: DC
  Filled 2016-12-18: qty 1

## 2016-12-18 MED ORDER — DOPAMINE-DEXTROSE 3.2-5 MG/ML-% IV SOLN: 2.5000 ug/kg/min | INTRAVENOUS | Status: DC

## 2016-12-18 NOTE — Progress Notes (Signed)
Patient mentioned that she had diarrhea at the beginning of the shift, no further complaints/instances and no fever. Patient has concerns for MD about continuing her dosage of lasix. She says she is 'dried out, nauseous, itching, and spotting. The lasix takes a lot out of me." Patient has been drowsy throughout the shift.

## 2016-12-18 NOTE — Progress Notes (Signed)
Ref: [name], Tanya Pagliuca, MD   Subjective:  Feeling better. Vital signs stable. No presacral edema.  Objective:  Vital Signs in the last 24 hours: Temp:  [97.5 F (36.4 C)-98.6 F (37 C)] 98.4 F (36.9 C) (06/10 0521) Pulse Rate:  [58-86] 86 (06/10 0836) Cardiac Rhythm: Normal sinus rhythm (06/10 0700) Resp:  [18] 18 (06/10 0521) BP: (112-158)/(52-70) 158/70 (06/10 0521) SpO2:  [97 %-100 %] 100 % (06/10 0521) Weight:  [58.3 kg (128 lb 9.6 oz)] 58.3 kg (128 lb 9.6 oz) (06/10 0521)  Physical Exam: BP Readings from Last 1 Encounters:  12/18/16 (!) 158/70    Wt Readings from Last 1 Encounters:  12/18/16 58.3 kg (128 lb 9.6 oz)    Weight change: -0.68 kg (-1 lb 8 oz) Body mass index is 22.07 kg/m. HEENT: [place]/AT, Eyes-Brown, PERL, EOMI, Conjunctiva-Pink, Sclera-Non-icteric Neck: No JVD, No bruit, Trachea midline. Lungs:  Clear, Bilateral. Cardiac:  Regular rhythm, normal S1 and S2, no S3. II/VI systolic murmur. Abdomen:  Soft, non-tender. BS present. Extremities:  Trace right and 1 + left lower leg edema present. No cyanosis. No clubbing. CNS: AxOx3, Cranial nerves grossly intact, moves all 4 extremities.  Skin: Warm and dry.   Intake/Output from previous day: 06/09 0701 - 06/10 0700 In: 620.9 [P.O.:360; I.V.:260.9] Out: 2300 [Urine:2300]    Lab Results: BMET    Component Value Date/Time   NA 135 12/18/2016 0236   NA 138 12/17/2016 0527   NA 137 12/16/2016 0347   K 3.9 12/18/2016 0236   K 3.7 12/17/2016 0527   K 4.6 12/16/2016 0347   CL 94 (L) 12/18/2016 0236   CL 96 (L) 12/17/2016 0527   CL 97 (L) 12/16/2016 0347   CO2 30 12/18/2016 0236   CO2 29 12/17/2016 0527   CO2 25 12/16/2016 0347   GLUCOSE 166 (H) 12/18/2016 0236   GLUCOSE 137 (H) 12/17/2016 0527   GLUCOSE 141 (H) 12/16/2016 0347   BUN 54 (H) 12/18/2016 0236   BUN 50 (H) 12/17/2016 0527   BUN 46 (H) 12/16/2016 0347   CREATININE 1.73 (H) 12/18/2016 0236   CREATININE 1.58 (H) 12/17/2016 0527   CREATININE  1.55 (H) 12/16/2016 0347   CALCIUM 9.0 12/18/2016 0236   CALCIUM 8.9 12/17/2016 0527   CALCIUM 9.5 12/16/2016 0347   GFRNONAA 30 (L) 12/18/2016 0236   GFRNONAA 34 (L) 12/17/2016 0527   GFRNONAA 34 (L) 12/16/2016 0347   GFRAA 35 (L) 12/18/2016 0236   GFRAA 39 (L) 12/17/2016 0527   GFRAA 40 (L) 12/16/2016 0347   CBC    Component Value Date/Time   WBC 4.4 12/14/2016 1348   RBC 5.16 (H) 12/14/2016 1348   HGB 14.1 12/14/2016 1348   HCT 43.1 12/14/2016 1348   PLT 175 12/14/2016 1348   MCV 83.5 12/14/2016 1348   MCH 27.3 12/14/2016 1348   MCHC 32.7 12/14/2016 1348   RDW 17.6 (H) 12/14/2016 1348   LYMPHSABS 1.5 12/14/2016 1348   MONOABS 0.5 12/14/2016 1348   EOSABS 0.0 12/14/2016 1348   BASOSABS 0.0 12/14/2016 1348   HEPATIC Function Panel  Recent Labs  09/30/16 0242 12/14/16 1348 12/18/16 0236  PROT 6.7 7.2 7.0   HEMOGLOBIN A1C No components found for: HGA1C,  MPG CARDIAC ENZYMES Lab Results  Component Value Date   CKTOTAL 59 09/07/2012   CKMB 3.2 09/07/2012   TROPONINI 0.22 (HH) 12/15/2016   TROPONINI 0.03 (HH) 12/14/2016   TROPONINI <0.03 12/14/2016   BNP No results for input(s): PROBNP in the  last 8760 hours. TSH No results for input(s): TSH in the last 8760 hours. CHOLESTEROL No results for input(s): CHOL in the last 8760 hours.  Scheduled Meds: . allopurinol  100 mg Oral Daily  . aspirin EC  81 mg Oral Daily  . carvedilol  6.25 mg Oral BID WC  . cholecalciferol  2,000 Units Oral Daily  . cycloSPORINE  1 drop Both Eyes Daily  . digoxin  0.0625 mg Oral Daily  . furosemide  80 mg Oral Daily  . heparin  5,000 Units Subcutaneous Q8H  . hydrocerin  1 application Topical BID  . losartan  50 mg Oral Daily  . olopatadine  1 drop Both Eyes Daily  . potassium chloride  10 mEq Oral BID  . sodium chloride flush  3 mL Intravenous Q12H   Continuous Infusions: . sodium chloride    . DOPamine 2.5 mcg/kg/min (12/18/16 0837)   PRN Meds:.sodium chloride,  acetaminophen, albuterol, hydrocortisone cream, nitroGLYCERIN, ondansetron (ZOFRAN) IV, pantoprazole, sodium chloride flush  Assessment/Plan: Acute on chronic biventricular systolic heart failure Hypertension Coronary artery disease Status post LAD stent Type 2 diabetes mellitus Moderate protein calorie malnutrition Moderate mitral regurgitation Severe tricuspid regurgitation CKD III  Change her IV Lasix to oral Lasix Decreased dopamine drip to 2.5 cg/kg/min.   LOS: 4 days    Tanya Cobb  MD  12/18/2016, 9:56 AM

## 2016-12-18 NOTE — Progress Notes (Signed)
Patient stable during 7 a to 7 p shift, BP low earlier in the shift (80's/40's); patient quite concerned about this.  BP did come up after several hours to 100's/40's.  No dizziness noted by patient.  Patient walking to bathroom without difficulty.  Anticipating discharge tomorrow.

## 2016-12-18 NOTE — Plan of Care (Signed)
Problem: Fluid Volume: Goal: Ability to maintain a balanced intake and output will improve Outcome: Progressing Continues to diurese  Problem: Activity: Goal: Capacity to carry out activities will improve Outcome: Progressing Reports dyspnea improved   Problem: Cardiac: Goal: Ability to achieve and maintain adequate cardiopulmonary perfusion will improve Outcome: Progressing Maintains oxygen saturation in the 90's on room air   Problem: Education: Goal: Ability to demonstrate managment of disease process will improve Outcome: Progressing Reviewed low sodium foods with patient and foods to limit such as cheese, processed meats and check the label on breads; discussed how high sodium foods will make her hold onto fluid

## 2016-12-19 MED ORDER — LOSARTAN POTASSIUM 50 MG PO TABS: 50.0000 mg | ORAL_TABLET | Freq: Every day | ORAL | 3 refills | Status: DC

## 2016-12-19 MED ORDER — FUROSEMIDE 80 MG PO TABS: 80.0000 mg | ORAL_TABLET | Freq: Every day | ORAL | Status: DC

## 2016-12-19 NOTE — Discharge Summary (Signed)
Physician Discharge Summary  Patient ID: [name] MRN: [id number] DOB/AGE: [birth year] 66 y.o.  Admit date: 12/14/2016 Discharge date: 12/19/2016  Admission Diagnoses: Acute on chronic biventricular systolic failure Hypertension CAD S/P stent in LAD Type II DM Moderate MR Severe TR Moderate degree malnutrition CKD, III  Discharge Diagnoses:  Principal Problem:   Biventricular heart failure, NYHA class 2 (HCC) Active Problems:   Type II Diabetes mellitus (HCC)   CAD   S/P LAD stent   Essential hypertension   Malnutrition of moderate degree   Moderate MR   Severe TR   CKD, III  Discharged Condition: fair  Hospital Course: 66 year old female with biventricular heart failure and NYHA class II has 15-20 pounds of weight gain, leg edema and shortness of breath by walking as little as 50 feet. She had good diuresis with IV lasix, IV dopamine drip and PO metolazone and lost close to 20 pounds of fluids. Her medications were adjusted. Patient verbalized to decrease salt and water intake. She was discharged home with follow up by me in 1 week.   Consults: cardiology  Significant Diagnostic Studies: labs: Normal magnesium, electrolytes and elevated BUN of 52 and creatinine of 1.77. CBC was unremarkable. Troponin-I was minimally elevated, mostly demand ischemia. BNP - 3198.6 on admission and 1,247.7 near discharge day.  EKG-SR, Left Axis Deviation, LA Enlarement, old septal infarct.  Chest x-ray: Cardiomegaly and mild pulmonary edema.  Treatments: cardiac meds: carvedilol, digoxin, potassium, SL NTG, furosemide and losartan.  Discharge Exam: Blood pressure (!) 160/80, pulse 61, temperature 97.8 F (36.6 C), temperature source Oral, resp. rate 18, height 5\' 4"  (1.626 m), weight 59.1 kg (130 lb 4.8 oz), SpO2 100 %. General appearance: alert, cooperative and appears stated age. Head: Normocephalic, atraumatic. Eyes: Brown eyes, pink conjunctiva, corneas clear. PERRL, EOM's  intact.  Neck: No adenopathy, no carotid bruit, no JVD, supple, symmetrical, trachea midline and thyroid not enlarged. Resp: Clear to auscultation bilaterally. Cardio: Regular rate and rhythm, S1, S2 normal, II/VI systolic murmur, no click, rub or gallop. GI: Soft, non-tender; bowel sounds normal; no organomegaly. Extremities: Trace lower leg edema, No cyanosis or clubbing. Skin: Warm and dry.  Neurologic: Alert and oriented X 3, normal strength and tone. Normal coordination and slow gait.  Disposition: 06-Home-Health Care Svc   Allergies as of 12/19/2016      Reactions   Bidil [isosorb Dinitrate-hydralazine] Itching, Other (See Comments)   Jittery, feels like something is crawling      Medication List    STOP taking these medications   collagenase ointment Commonly known as:  SANTYL     TAKE these medications   acetaminophen 500 MG tablet Commonly known as:  TYLENOL Take 500 mg by mouth every 6 (six) hours as needed for moderate pain or headache.   albuterol 108 (90 Base) MCG/ACT inhaler Commonly known as:  PROVENTIL HFA;VENTOLIN HFA Inhale 2 puffs into the lungs every 6 (six) hours as needed for wheezing or shortness of breath.   allopurinol 100 MG tablet Commonly known as:  ZYLOPRIM Take 1 tablet (100 mg total) by mouth daily.   aspirin EC 81 MG tablet Take 81 mg by mouth daily.   carvedilol 6.25 MG tablet Commonly known as:  COREG Take 1 tablet (6.25 mg total) by mouth 2 (two) times daily with a meal.   cycloSPORINE 0.05 % ophthalmic emulsion Commonly known as:  RESTASIS Place 1 drop into both eyes daily.   digoxin 0.125 MG tablet Commonly known as:  LANOXIN Take 0.5 tablets (0.0625 mg total) by mouth daily.   furosemide 80 MG tablet Commonly known as:  LASIX Take 1 tablet (80 mg total) by mouth daily. Take 160 mg by mouth in the morning and take 80 mg by mouth in the evening What changed:  how much to take  when to take this  additional instructions    hydrocerin Crea Apply 1 application topically 2 (two) times daily.   losartan 50 MG tablet Commonly known as:  COZAAR Take 1 tablet (50 mg total) by mouth daily.   nitroGLYCERIN 0.4 MG SL tablet Commonly known as:  NITROSTAT Place 0.4 mg under the tongue every 5 (five) minutes as needed for chest pain.   omeprazole 20 MG tablet Commonly known as:  PRILOSEC OTC Take 20 mg by mouth daily as needed (for acid reflux).   PAZEO 0.7 % Soln Generic drug:  Olopatadine HCl Place 1 drop into both eyes daily.   potassium chloride 10 MEQ tablet Commonly known as:  K-DUR,KLOR-CON Take 1 tablet (10 mEq total) by mouth daily.   Vitamin D3 2000 units Chew Chew 2,000 Units by mouth daily.      Follow-up Information    [facility] Follow up.   Why:  They will continue to do your home health care at your home Contact information: [address] [place] [address] [phone]        [name], Nickie Deren, MD. Go on 12/26/2016.   Specialty:  Cardiology Why:  @3 :00pm Contact information: [address] [place] [address] [phone]           Signed: [name],Audryna Wendt S 12/19/2016, 8:33 AM

## 2017-03-11 ENCOUNTER — Inpatient Hospital Stay (HOSPITAL_COMMUNITY)
Admission: EM | Admit: 2017-03-11 | Discharge: 2017-03-17 | DRG: 291 | Disposition: A | Discharge status: Home / Self Care | Payer: Medicare HMO | Attending: Cardiology | Admitting: Cardiology

## 2017-03-11 ENCOUNTER — Emergency Department (HOSPITAL_COMMUNITY): Payer: Medicare HMO

## 2017-03-11 ENCOUNTER — Encounter (HOSPITAL_COMMUNITY): Payer: Self-pay | Admitting: *Deleted

## 2017-03-11 DIAGNOSIS — R0602 Shortness of breath: Secondary | ICD-10-CM

## 2017-03-11 DIAGNOSIS — E1122 Type 2 diabetes mellitus with diabetic chronic kidney disease: Secondary | ICD-10-CM | POA: Diagnosis present

## 2017-03-11 DIAGNOSIS — I5082 Biventricular heart failure: Secondary | ICD-10-CM | POA: Diagnosis present

## 2017-03-11 DIAGNOSIS — I5021 Acute systolic (congestive) heart failure: Secondary | ICD-10-CM | POA: Diagnosis present

## 2017-03-11 DIAGNOSIS — I5023 Acute on chronic systolic (congestive) heart failure: Secondary | ICD-10-CM | POA: Diagnosis present

## 2017-03-11 DIAGNOSIS — K219 Gastro-esophageal reflux disease without esophagitis: Secondary | ICD-10-CM | POA: Diagnosis present

## 2017-03-11 DIAGNOSIS — Z8711 Personal history of peptic ulcer disease: Secondary | ICD-10-CM | POA: Diagnosis not present

## 2017-03-11 DIAGNOSIS — F419 Anxiety disorder, unspecified: Secondary | ICD-10-CM | POA: Diagnosis present

## 2017-03-11 DIAGNOSIS — N183 Chronic kidney disease, stage 3 (moderate): Secondary | ICD-10-CM | POA: Diagnosis present

## 2017-03-11 DIAGNOSIS — M79669 Pain in unspecified lower leg: Secondary | ICD-10-CM | POA: Diagnosis present

## 2017-03-11 DIAGNOSIS — E785 Hyperlipidemia, unspecified: Secondary | ICD-10-CM | POA: Diagnosis present

## 2017-03-11 DIAGNOSIS — Z9889 Other specified postprocedural states: Secondary | ICD-10-CM

## 2017-03-11 DIAGNOSIS — R252 Cramp and spasm: Secondary | ICD-10-CM | POA: Diagnosis present

## 2017-03-11 DIAGNOSIS — I872 Venous insufficiency (chronic) (peripheral): Secondary | ICD-10-CM | POA: Diagnosis present

## 2017-03-11 DIAGNOSIS — M109 Gout, unspecified: Secondary | ICD-10-CM | POA: Diagnosis present

## 2017-03-11 DIAGNOSIS — N179 Acute kidney failure, unspecified: Secondary | ICD-10-CM

## 2017-03-11 DIAGNOSIS — Z87891 Personal history of nicotine dependence: Secondary | ICD-10-CM

## 2017-03-11 DIAGNOSIS — I2510 Atherosclerotic heart disease of native coronary artery without angina pectoris: Secondary | ICD-10-CM | POA: Diagnosis present

## 2017-03-11 DIAGNOSIS — R609 Edema, unspecified: Secondary | ICD-10-CM | POA: Diagnosis not present

## 2017-03-11 DIAGNOSIS — I130 Hypertensive heart and chronic kidney disease with heart failure and stage 1 through stage 4 chronic kidney disease, or unspecified chronic kidney disease: Principal | ICD-10-CM | POA: Diagnosis present

## 2017-03-11 DIAGNOSIS — Z955 Presence of coronary angioplasty implant and graft: Secondary | ICD-10-CM

## 2017-03-11 DIAGNOSIS — I252 Old myocardial infarction: Secondary | ICD-10-CM | POA: Diagnosis not present

## 2017-03-11 DIAGNOSIS — Z888 Allergy status to other drugs, medicaments and biological substances status: Secondary | ICD-10-CM

## 2017-03-11 DIAGNOSIS — M7989 Other specified soft tissue disorders: Secondary | ICD-10-CM | POA: Diagnosis present

## 2017-03-11 LAB — CBC
HEMATOCRIT: 40.9 % (ref 36.0–46.0)
HEMATOCRIT: 42.7 % (ref 36.0–46.0)
HEMOGLOBIN: 13.4 g/dL (ref 12.0–15.0)
Hemoglobin: 13.7 g/dL (ref 12.0–15.0)
MCH: 27.9 pg (ref 26.0–34.0)
MCH: 27.4 pg (ref 26.0–34.0)
MCHC: 32.8 g/dL (ref 30.0–36.0)
MCHC: 32.1 g/dL (ref 30.0–36.0)
MCV: 85.2 fL (ref 78.0–100.0)
MCV: 85.4 fL (ref 78.0–100.0)
Platelets: 216 10*3/uL (ref 150–400)
Platelets: 204 10*3/uL (ref 150–400)
RBC: 4.8 MIL/uL (ref 3.87–5.11)
RBC: 5 MIL/uL (ref 3.87–5.11)
RDW: 17 % — ABNORMAL HIGH (ref 11.5–15.5)
RDW: 16.9 % — ABNORMAL HIGH (ref 11.5–15.5)
WBC: 4.7 10*3/uL (ref 4.0–10.5)
WBC: 5.3 10*3/uL (ref 4.0–10.5)

## 2017-03-11 LAB — COMPREHENSIVE METABOLIC PANEL
ALK PHOS: 85 U/L (ref 38–126)
ALT: 10 U/L — AB (ref 14–54)
AST: 33 U/L (ref 15–41)
Albumin: 3.9 g/dL (ref 3.5–5.0)
Anion gap: 12 (ref 5–15)
BILIRUBIN TOTAL: 1.7 mg/dL — AB (ref 0.3–1.2)
BUN: 69 mg/dL — AB (ref 6–20)
CALCIUM: 9 mg/dL (ref 8.9–10.3)
CHLORIDE: 102 mmol/L (ref 101–111)
CO2: 22 mmol/L (ref 22–32)
CREATININE: 2.07 mg/dL — AB (ref 0.44–1.00)
GFR, EST AFRICAN AMERICAN: 28 mL/min — AB (ref >60)
GFR, EST NON AFRICAN AMERICAN: 24 mL/min — AB (ref >60)
Glucose, Bld: 119 mg/dL — ABNORMAL HIGH (ref 65–99)
Potassium: 4.8 mmol/L (ref 3.5–5.1)
Sodium: 136 mmol/L (ref 135–145)
Total Protein: 7.4 g/dL (ref 6.5–8.1)

## 2017-03-11 LAB — BASIC METABOLIC PANEL
ANION GAP: 12 (ref 5–15)
BUN: 68 mg/dL — ABNORMAL HIGH (ref 6–20)
CHLORIDE: 107 mmol/L (ref 101–111)
CO2: 18 mmol/L — ABNORMAL LOW (ref 22–32)
Calcium: 9.2 mg/dL (ref 8.9–10.3)
Creatinine, Ser: 2.01 mg/dL — ABNORMAL HIGH (ref 0.44–1.00)
GFR calc non Af Amer: 25 mL/min — ABNORMAL LOW (ref >60)
GFR, EST AFRICAN AMERICAN: 29 mL/min — AB (ref >60)
GLUCOSE: 143 mg/dL — AB (ref 65–99)
POTASSIUM: 4.5 mmol/L (ref 3.5–5.1)
Sodium: 137 mmol/L (ref 135–145)

## 2017-03-11 LAB — GLUCOSE, CAPILLARY: GLUCOSE-CAPILLARY: 131 mg/dL — AB (ref 65–99)

## 2017-03-11 LAB — I-STAT TROPONIN, ED: TROPONIN I, POC: 0.01 ng/mL (ref 0.00–0.08)

## 2017-03-11 LAB — MAGNESIUM: MAGNESIUM: 2.6 mg/dL — AB (ref 1.7–2.4)

## 2017-03-11 LAB — TROPONIN I: Troponin I: <0.03 ng/mL (ref <0.03)

## 2017-03-11 LAB — HEMOGLOBIN A1C
Hgb A1c MFr Bld: 6.3 % — ABNORMAL HIGH (ref 4.8–5.6)
MEAN PLASMA GLUCOSE: 134.11 mg/dL

## 2017-03-11 LAB — BRAIN NATRIURETIC PEPTIDE
B Natriuretic Peptide: 4079.6 pg/mL — ABNORMAL HIGH (ref 0.0–100.0)
B Natriuretic Peptide: 2822 pg/mL — ABNORMAL HIGH (ref 0.0–100.0)

## 2017-03-11 MED ORDER — ASPIRIN EC 81 MG PO TBEC: 81.0000 mg | DELAYED_RELEASE_TABLET | Freq: Every day | ORAL | Status: DC

## 2017-03-11 MED ORDER — SODIUM CHLORIDE 0.9% FLUSH
3.0000 mL | Freq: Two times a day (BID) | INTRAVENOUS | Status: DC
  Administered 2017-03-11 – 2017-03-17 (×9): 3 mL via INTRAVENOUS

## 2017-03-11 MED ORDER — HEPARIN SODIUM (PORCINE) 5000 UNIT/ML IJ SOLN
5000.0000 [IU] | Freq: Three times a day (TID) | INTRAMUSCULAR | Status: DC
  Administered 2017-03-13: 5000 [IU] via SUBCUTANEOUS
  Filled 2017-03-11 (×8): qty 1

## 2017-03-11 MED ORDER — POTASSIUM CHLORIDE CRYS ER 20 MEQ PO TBCR
20.0000 meq | EXTENDED_RELEASE_TABLET | Freq: Two times a day (BID) | ORAL | Status: DC
  Administered 2017-03-11 – 2017-03-17 (×12): 20 meq via ORAL
  Filled 2017-03-11 (×13): qty 1

## 2017-03-11 MED ORDER — NITROGLYCERIN 0.4 MG SL SUBL: 0.4000 mg | SUBLINGUAL_TABLET | SUBLINGUAL | Status: DC | PRN

## 2017-03-11 MED ORDER — SODIUM CHLORIDE 0.9 % IV SOLN: 250.0000 mL | INTRAVENOUS | Status: DC | PRN

## 2017-03-11 MED ORDER — FUROSEMIDE 10 MG/ML IJ SOLN
40.0000 mg | Freq: Two times a day (BID) | INTRAMUSCULAR | Status: DC
  Administered 2017-03-11 – 2017-03-17 (×12): 40 mg via INTRAVENOUS
  Filled 2017-03-11 (×12): qty 4

## 2017-03-11 MED ORDER — INSULIN ASPART 100 UNIT/ML ~~LOC~~ SOLN
0.0000 [IU] | Freq: Three times a day (TID) | SUBCUTANEOUS | Status: DC
  Administered 2017-03-12: 2 [IU] via SUBCUTANEOUS
  Administered 2017-03-12: 1 [IU] via SUBCUTANEOUS
  Administered 2017-03-13: 2 [IU] via SUBCUTANEOUS
  Administered 2017-03-13: 1 [IU] via SUBCUTANEOUS
  Administered 2017-03-13: 2 [IU] via SUBCUTANEOUS
  Administered 2017-03-14 – 2017-03-15 (×2): 1 [IU] via SUBCUTANEOUS

## 2017-03-11 MED ORDER — POLYVINYL ALCOHOL 1.4 % OP SOLN
1.0000 [drp] | Freq: Every day | OPHTHALMIC | Status: DC
  Administered 2017-03-11 – 2017-03-17 (×6): 1 [drp] via OPHTHALMIC
  Filled 2017-03-11: qty 15

## 2017-03-11 MED ORDER — SODIUM CHLORIDE 0.9% FLUSH: 3.0000 mL | INTRAVENOUS | Status: DC | PRN

## 2017-03-11 MED ORDER — ALBUTEROL SULFATE (2.5 MG/3ML) 0.083% IN NEBU: 2.5000 mg | INHALATION_SOLUTION | Freq: Four times a day (QID) | RESPIRATORY_TRACT | Status: DC | PRN

## 2017-03-11 MED ORDER — ALBUTEROL SULFATE HFA 108 (90 BASE) MCG/ACT IN AERS: 2.0000 | INHALATION_SPRAY | Freq: Four times a day (QID) | RESPIRATORY_TRACT | Status: DC | PRN

## 2017-03-11 MED ORDER — ALLOPURINOL 100 MG PO TABS
100.0000 mg | ORAL_TABLET | Freq: Every day | ORAL | Status: DC
  Administered 2017-03-11 – 2017-03-17 (×7): 100 mg via ORAL
  Filled 2017-03-11 (×7): qty 1

## 2017-03-11 MED ORDER — ONDANSETRON HCL 4 MG/2ML IJ SOLN: 4.0000 mg | Freq: Four times a day (QID) | INTRAMUSCULAR | Status: DC | PRN

## 2017-03-11 MED ORDER — CARVEDILOL 6.25 MG PO TABS
6.2500 mg | ORAL_TABLET | Freq: Two times a day (BID) | ORAL | Status: DC
  Administered 2017-03-11 – 2017-03-17 (×12): 6.25 mg via ORAL
  Filled 2017-03-11 (×12): qty 1

## 2017-03-11 MED ORDER — MILRINONE LACTATE IN DEXTROSE 20-5 MG/100ML-% IV SOLN
0.2500 ug/kg/min | INTRAVENOUS | Status: DC
  Administered 2017-03-12: 0.125 ug/kg/min via INTRAVENOUS
  Administered 2017-03-13 – 2017-03-17 (×5): 0.25 ug/kg/min via INTRAVENOUS
  Filled 2017-03-11 (×8): qty 100

## 2017-03-11 MED ORDER — ASPIRIN EC 81 MG PO TBEC
81.0000 mg | DELAYED_RELEASE_TABLET | Freq: Every day | ORAL | Status: DC
  Administered 2017-03-11 – 2017-03-17 (×7): 81 mg via ORAL
  Filled 2017-03-11 (×7): qty 1

## 2017-03-11 MED ORDER — HYPROMELLOSE (GONIOSCOPIC) 2.5 % OP SOLN: 1.0000 [drp] | Freq: Every day | OPHTHALMIC | Status: DC

## 2017-03-11 MED ORDER — ACETAMINOPHEN 500 MG PO TABS: 500.0000 mg | ORAL_TABLET | Freq: Four times a day (QID) | ORAL | Status: DC | PRN

## 2017-03-11 MED ORDER — CARVEDILOL 6.25 MG PO TABS: 6.2500 mg | ORAL_TABLET | Freq: Two times a day (BID) | ORAL | Status: DC

## 2017-03-11 MED ORDER — MILRINONE LACTATE IN DEXTROSE 20-5 MG/100ML-% IV SOLN: 0.2500 ug/kg/min | INTRAVENOUS | Status: DC

## 2017-03-11 MED ORDER — FUROSEMIDE 10 MG/ML IJ SOLN
100.0000 mg | Freq: Once | INTRAVENOUS | Status: AC
  Administered 2017-03-11: 100 mg via INTRAVENOUS
  Filled 2017-03-11: qty 10

## 2017-03-11 MED ORDER — FUROSEMIDE 10 MG/ML IJ SOLN: 40.0000 mg | Freq: Two times a day (BID) | INTRAMUSCULAR | Status: DC

## 2017-03-11 MED ORDER — CYCLOSPORINE 0.05 % OP EMUL
1.0000 [drp] | Freq: Every day | OPHTHALMIC | Status: DC
  Administered 2017-03-11 – 2017-03-17 (×7): 1 [drp] via OPHTHALMIC
  Filled 2017-03-11 (×7): qty 1

## 2017-03-11 NOTE — ED Provider Notes (Signed)
[facility]-EMER[facility]ENCY DEPT Provider Note   CSN: [id number] Arrival date & time: 03/11/17  1359  History   Chief Complaint Chief Complaint  Patient presents with  . Shortness of Breath    HPI [name] is a 66 y.o. female.  This is a 66 year old female with PMH of chronic systolic biventricular heart failure, NYHA II who presents with concerns for an exacerbation of her disease.  Patient was recently admitted here in June for similar issue where she was diuresed.  Patient presented at that time with 15-20 pounds of weight gain, leg edema, progressive shortness of breath and exertional dyspnea.  Her medications were adjusted and she was placed on IV Lasix and p.o. metolazone with about 20 pound fluid loss. Patient's last known EF 25-30% as of December 2017.  The patient currently states her leg edema has not greatly increased however she endorses worsening of her venous insufficiency rash in her right lower extremity.  She notes continued worsening exertional dyspnea as little as 20 feet in her home.  She states she has had no changes to her medications although she states she has not taken her Lasix today due to  worsening shortness of breath.  Endorses increased cough which is nonproductive, denies fever, nausea, vomiting, abdominal pain, blurry vision, numbness and tingling.      Past Medical History:  Diagnosis Date  . Biventricular heart failure (HCC)    systolic/notes 07/25/2016  . CHF (congestive heart failure) (HCC)   . Coronary artery disease   . GERD (gastroesophageal reflux disease)   . Gout   . High cholesterol   . Hypertension   . Shortness of breath dyspnea   . Stomach ulcer   . Type II diabetes mellitus [facility])    Patient Active Problem List   Diagnosis Date Noted  . Acute systolic heart failure (HCC) 03/11/2017  . Malnutrition of moderate degree 07/27/2016  . Biventricular heart failure, NYHA class 2 (HCC) 08/12/2014  . PROTEINURIA, MILD 07/21/2006  . Diabetes  mellitus (HCC) 05/24/2006  . HYPERLIPIDEMIA 05/24/2006  . Essential hypertension 05/24/2006    Past Surgical History:  Procedure Laterality Date  . CARDIAC CATHETERIZATION  2014  . CORONARY ANGIOPLASTY WITH STENT PLACEMENT  2013  . LEFT AND RIGHT HEART CATHETERIZATION WITH CORONARY ANGIOGRAM N/A 11/25/2011   Procedure: LEFT AND RIGHT HEART CATHETERIZATION WITH CORONARY ANGIOGRAM;  Surgeon: Robynn Pane, MD;  Location: [facility] CATH LAB;  Service: Cardiovascular;  Laterality: N/A;  . LEFT AND RIGHT HEART CATHETERIZATION WITH CORONARY ANGIOGRAM N/A 09/07/2012   Procedure: LEFT AND RIGHT HEART CATHETERIZATION WITH CORONARY ANGIOGRAM;  Surgeon: [name], MD;  Location: [facility] CATH LAB;  Service: Cardiovascular;  Laterality: N/A;  . PERCUTANEOUS CORONARY STENT INTERVENTION (PCI-S) Right 11/25/2011   Procedure: PERCUTANEOUS CORONARY STENT INTERVENTION (PCI-S);  Surgeon: Robynn Pane, MD;  Location: [facility] CATH LAB;  Service: Cardiovascular;  Laterality: Right;  . RIGHT/LEFT HEART CATH AND CORONARY ANGIOGRAPHY N/A 09/30/2016   Procedure: Right/Left Heart Cath and Coronary Angiography;  Surgeon: [name], MD;  Location: [facility] INVASIVE CV LAB;  Service: Cardiovascular;  Laterality: N/A;  . TONSILLECTOMY  1950's    OB History    No data available       Home Medications    Prior to Admission medications   Medication Sig Start Date End Date Taking? Authorizing Provider  acetaminophen (TYLENOL) 500 MG tablet Take 500 mg by mouth every 6 (six) hours as needed for moderate pain or headache.   Yes [provider]  albuterol (PROVENTIL HFA;VENTOLIN HFA) 108 (90 Base) MCG/ACT inhaler Inhale 2 puffs into the lungs every 6 (six) hours as needed for wheezing or shortness of breath.   Yes [provider]  allopurinol (ZYLOPRIM) 100 MG tablet Take 1 tablet (100 mg total) by mouth daily. 08/03/16  Yes [name], MD  aspirin EC 81 MG tablet Take 81 mg by mouth daily.   Yes [provider]  carvedilol (COREG) 6.25 MG tablet Take 1 tablet (6.25 mg total) by mouth 2 (two) times daily with a meal. 10/01/16  Yes [name], MD  collagenase (SANTYL) ointment Apply 1 application topically daily.   Yes [provider]  cycloSPORINE (RESTASIS) 0.05 % ophthalmic emulsion Place 1 drop into both eyes daily.   Yes [provider]  furosemide (LASIX) 80 MG tablet Take 1 tablet (80 mg total) by mouth daily. Take 160 mg by mouth in the morning and take 80 mg by mouth in the evening Patient taking differently: Take 80 mg by mouth 3 (three) times daily.  12/19/16  Yes [name], MD  hydroxypropyl methylcellulose / hypromellose (GONIOVISC) 2.5 % ophthalmic solution Place 1 drop into both eyes daily.   Yes [provider]  nitroGLYCERIN (NITROSTAT) 0.4 MG SL tablet Place 0.4 mg under the tongue every 5 (five) minutes as needed for chest pain. 09/10/12  Yes [name], MD  potassium chloride (K-DUR,KLOR-CON) 10 MEQ tablet Take 1 tablet (10 mEq total) by mouth daily. 08/03/16  Yes [name], MD  digoxin (LANOXIN) 0.125 MG tablet Take 0.5 tablets (0.0625 mg total) by mouth daily. Patient not taking: Reported on 03/11/2017 07/13/15   [name], MD  hydrocerin (EUCERIN) CREA Apply 1 application topically 2 (two) times daily. Patient not taking: Reported on 03/11/2017 10/01/16   [name], MD  losartan (COZAAR) 50 MG tablet Take 1 tablet (50 mg total) by mouth daily. Patient not taking: Reported on 03/11/2017 12/19/16   [name], MD    Family History History reviewed. No pertinent family history.  Social History Social History  Substance Use Topics  . Smoking status: Former Smoker    Packs/day: 0.50    Years: 20.00    Types: Cigarettes  . Smokeless tobacco: Never Used     Comment: quit smoking in the 80's  . Alcohol use No     Allergies   Bidil [isosorb dinitrate-hydralazine]   Review of Systems Review of Systems  Constitutional:  Positive for activity change, appetite change and unexpected weight change. Negative for chills, diaphoresis and fever.  HENT: Negative for ear pain, sore throat and trouble swallowing.   Eyes: Negative for pain and visual disturbance.  Respiratory: Positive for cough and shortness of breath. Negative for chest tightness and wheezing.   Cardiovascular: Positive for leg swelling. Negative for chest pain and palpitations.  Gastrointestinal: Negative for abdominal pain, blood in stool, constipation, diarrhea, nausea and vomiting.  Genitourinary: Positive for decreased urine volume. Negative for difficulty urinating, dysuria and hematuria.  Musculoskeletal: Negative for arthralgias, back pain, myalgias and neck pain.  Skin: Positive for rash. Negative for color change.  Neurological: Negative for seizures and syncope.  All other systems reviewed and are negative.    Physical Exam Updated Vital Signs BP (!) 168/147   Pulse 87   Temp 97.8 F (36.6 C) (Oral)   Resp (!) 37   Ht 5\' 4"  (1.626 m)   Wt 66.8 kg (147 lb 4.3 oz)   SpO2 98%   BMI  25.28 kg/m   Physical Exam  Constitutional: She appears well-developed and well-nourished. No distress.  HENT:  Head: Normocephalic and atraumatic.  Eyes: Conjunctivae are normal.  Neck: Neck supple.  Cardiovascular: Normal rate, regular rhythm and intact distal pulses.  Exam reveals gallop and S3.   No murmur heard. Pulmonary/Chest: Effort normal and breath sounds normal. No respiratory distress.  Abdominal: Soft. There is no tenderness.  Musculoskeletal: She exhibits no edema.  Neurological: She is alert.  Skin: Skin is warm and dry. Rash noted.  Rash consistent with venous insufficiency present in RLE.  Psychiatric: She has a normal mood and affect.  Nursing note and vitals reviewed.    ED Treatments / Results  Labs (all labs ordered are listed, but only abnormal results are displayed) Labs Reviewed  BASIC METABOLIC PANEL - Abnormal;  Notable for the following:       Result Value   CO2 18 (*)    Glucose, Bld 143 (*)    BUN 68 (*)    Creatinine, Ser 2.01 (*)    GFR calc non Af Amer 25 (*)    GFR calc Af Amer 29 (*)    All other components within normal limits  CBC - Abnormal; Notable for the following:    RDW 17.0 (*)    All other components within normal limits  BRAIN NATRIURETIC PEPTIDE - Abnormal; Notable for the following:    B Natriuretic Peptide 4,079.6 (*)    All other components within normal limits  CBC - Abnormal; Notable for the following:    RDW 16.9 (*)    All other components within normal limits  COMPREHENSIVE METABOLIC PANEL - Abnormal; Notable for the following:    Glucose, Bld 119 (*)    BUN 69 (*)    Creatinine, Ser 2.07 (*)    ALT 10 (*)    Total Bilirubin 1.7 (*)    GFR calc non Af Amer 24 (*)    GFR calc Af Amer 28 (*)    All other components within normal limits  MAGNESIUM - Abnormal; Notable for the following:    Magnesium 2.6 (*)    All other components within normal limits  BRAIN NATRIURETIC PEPTIDE - Abnormal; Notable for the following:    B Natriuretic Peptide 2,822.0 (*)    All other components within normal limits  HEMOGLOBIN A1C - Abnormal; Notable for the following:    Hgb A1c MFr Bld 6.3 (*)    All other components within normal limits  GLUCOSE, CAPILLARY - Abnormal; Notable for the following:    Glucose-Capillary 131 (*)    All other components within normal limits  MRSA PCR SCREENING  TROPONIN I  BASIC METABOLIC PANEL  TROPONIN I  TROPONIN I  COOXEMETRY PANEL  COOXEMETRY PANEL  I-STAT TROPONIN, ED    EKG  EKG Interpretation  Date/Time:  Saturday March 11 2017 14:06:31 EDT Ventricular Rate:  89 PR Interval:  180 QRS Duration: 94 QT Interval:  364 QTC Calculation: 442 R Axis:   142 Text Interpretation:  Normal sinus rhythm with sinus arrhythmia Right axis deviation No significant change since last tracing Confirmed by Cathren Laine ([id number]) on 03/11/2017  3:25:11 PM       Radiology Dg Chest 2 View  Result Date: 03/11/2017 CLINICAL DATA:  Shortness of breath for 2 weeks. EXAM: CHEST  2 VIEW COMPARISON:  12/14/2016 and prior exams FINDINGS: Cardiomegaly and pulmonary vascular congestion again noted. There is no evidence of focal airspace disease, pulmonary edema, suspicious  pulmonary nodule/mass, pleural effusion, or pneumothorax. No acute bony abnormalities are identified. IMPRESSION: Cardiomegaly with pulmonary vascular congestion. Electronically Signed   By: Harmon PierJeffrey  Hu M.D.   On: 03/11/2017 15:44    Procedures Procedures (including critical care time)  Medications Ordered in ED Medications  allopurinol (ZYLOPRIM) tablet 100 mg (100 mg Oral Given by Other 03/11/17 2154)  acetaminophen (TYLENOL) tablet 500 mg (not administered)  cycloSPORINE (RESTASIS) 0.05 % ophthalmic emulsion 1 drop (1 drop Both Eyes Given by Other 03/11/17 2157)  nitroGLYCERIN (NITROSTAT) SL tablet 0.4 mg (not administered)  sodium chloride flush (NS) 0.9 % injection 3 mL (3 mLs Intravenous Given 03/11/17 2205)  sodium chloride flush (NS) 0.9 % injection 3 mL (not administered)  0.9 %  sodium chloride infusion (not administered)  ondansetron (ZOFRAN) injection 4 mg (not administered)  heparin injection 5,000 Units (5,000 Units Subcutaneous Not Given 03/11/17 2157)  potassium chloride SA (K-DUR,KLOR-CON) CR tablet 20 mEq (20 mEq Oral Given by Other 03/11/17 2156)  insulin aspart (novoLOG) injection 0-9 Units (not administered)  milrinone (PRIMACOR) 20 MG/100 ML (0.2 mg/mL) infusion (not administered)  polyvinyl alcohol (LIQUIFILM TEARS) 1.4 % ophthalmic solution 1 drop (1 drop Both Eyes Given by Other 03/11/17 2156)  furosemide (LASIX) injection 40 mg (40 mg Intravenous Given by Other 03/11/17 2156)  albuterol (PROVENTIL) (2.5 MG/3ML) 0.083% nebulizer solution 2.5 mg (not administered)  aspirin EC tablet 81 mg (81 mg Oral Given by Other 03/11/17 2155)  carvedilol (COREG) tablet 6.25  mg (6.25 mg Oral Given by Other 03/11/17 2155)  furosemide (LASIX) 100 mg in dextrose 5 % 50 mL IVPB (0 mg Intravenous Stopped 03/11/17 1709)     Initial Impression / Assessment and Plan / ED Course  I have reviewed the triage vital signs and the nursing notes.  Pertinent labs & imaging results that were available during my care of the patient were reviewed by me and considered in my medical decision making (see chart for details).     This is a 66 year old female with PMH of chronic systolic biventricular heart failure, NYHA II who presents with concerns for an exacerbation of her disease.    On exam, crackles heard bilaterally, S3 auscultated.  Minimal nonpitting edema present bilateral lower extremities, venous insufficiency present over right lower extremity.  Patient denies change in symptoms other than worsening shortness of breath, exertional dyspnea over the past few weeks.  Denies any chest pain or change in her medications.  CBC, BMP, EKG, CXR, troponins ordered.  BNP 4,079. EKG unchanged from previous, confirms Left atrial enlargement, sinus rhythm. CXR shows stable cardiomegaly with re-demonstrated pulmonary congestion.  IV Lasix given in ED.  Discssed admission with cardiology who agreed.  Final Clinical Impressions(s) / ED Diagnoses   Final diagnoses:  SOB (shortness of breath)  Biventricular congestive heart failure (HCC)  AKI (acute kidney injury) [facility](HCC)    New Prescriptions Current Discharge Medication List       Shaune PollackBriggs, Shatora Weatherbee, MD 03/11/17 2346    Cathren LaineSteinl, Kevin, MD 03/12/17 1736

## 2017-03-11 NOTE — ED Notes (Signed)
Pt out of room.

## 2017-03-11 NOTE — ED Triage Notes (Signed)
Pt reports feeling sob for over the past week. Having swelling to legs and abd. Hx of chf. Reports taking all meds as prescribed. spo2 99% at triage, ekg done. Skin w/d. Pt denies any pain.

## 2017-03-11 NOTE — H&P (Signed)
[name] is an 66 y.o. female.   Chief Complaint: Progressive worsening shortness of breath associated with abdominal and leg swelling 2 weeks. 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 and 80 mg in the p.m. without much improvement. Patient gives history of PND orthopnea. Denies any palpitation lightheadedness or syncope. Denies chest pain. Denies any dietary indiscretion or excessive fluid intake. Denies any fever or chills. Denies any urinary complaints. States has gained approximately 8-10 pounds lately. Patient was noted to have BNP above 4000.  Past Medical History:  Diagnosis Date  . Biventricular heart failure ([place])    systolic/notes 02/18/9146  . CHF (congestive heart failure) ([place])   . Coronary artery disease   . GERD (gastroesophageal reflux disease)   . Gout   . High cholesterol   . Hypertension   . Shortness of breath dyspnea   . Stomach ulcer   . Type II diabetes mellitus ([place])     Past Surgical History:  Procedure Laterality Date  . CARDIAC CATHETERIZATION  2014  . CORONARY ANGIOPLASTY WITH STENT PLACEMENT  2013  . LEFT AND RIGHT HEART CATHETERIZATION WITH CORONARY ANGIOGRAM N/A 11/25/2011   Procedure: LEFT AND RIGHT HEART CATHETERIZATION WITH CORONARY ANGIOGRAM;  Surgeon: Clent Demark, MD;  Location: [facility] CATH LAB;  Service: Cardiovascular;  Laterality: N/A;  . LEFT AND RIGHT HEART CATHETERIZATION WITH CORONARY ANGIOGRAM N/A 09/07/2012   Procedure: LEFT AND RIGHT HEART  CATHETERIZATION WITH CORONARY ANGIOGRAM;  Surgeon: Birdie Riddle, MD;  Location: [place] CATH LAB;  Service: Cardiovascular;  Laterality: N/A;  . PERCUTANEOUS CORONARY STENT INTERVENTION (PCI-S) Right 11/25/2011   Procedure: PERCUTANEOUS CORONARY STENT INTERVENTION (PCI-S);  Surgeon: Clent Demark, MD;  Location: [facility] CATH LAB;  Service: Cardiovascular;  Laterality: Right;  . RIGHT/LEFT HEART CATH AND CORONARY ANGIOGRAPHY N/A 09/30/2016   Procedure: Right/Left Heart Cath and Coronary Angiography;  Surgeon: Dixie Dials, MD;  Location: [place] CV LAB;  Service: Cardiovascular;  Laterality: N/A;  . TONSILLECTOMY  1950's    History reviewed. No pertinent family history. Social History:  reports that she has quit smoking. Her smoking use included Cigarettes. She has a 10.00 pack-year smoking history. She has never used smokeless tobacco. She reports that she does not drink alcohol or use drugs.  Allergies:  Allergies  Allergen Reactions  . Bidil [Isosorb Dinitrate-Hydralazine] Itching and Other (See Comments)    Jittery, feels like something is crawling     (Not in a hospital admission)  Results for orders placed or performed during the hospital encounter of 03/11/17 (from the past 48 hour(s))  Basic metabolic panel     Status: Abnormal   Collection Time: 03/11/17  2:13 PM  Result Value Ref Range   Sodium 137 135 - 145 mmol/L   Potassium 4.5 3.5 - 5.1 mmol/L   Chloride 107 101 - 111 mmol/L   CO2 18 (L) 22 - 32 mmol/L   Glucose, Bld 143 (H) 65 - 99 mg/dL   BUN 68 (H) 6 - 20 mg/dL   Creatinine, Ser 2.01 (H) 0.44 - 1.00 mg/dL   Calcium  9.2 8.9 - 10.3 mg/dL   GFR calc non Af Amer 25 (L) >60 mL/min   GFR calc Af Amer 29 (L) >60 mL/min    Comment: (NOTE) The eGFR has been calculated using the CKD EPI equation. This calculation has not been validated in all clinical situations. eGFR's persistently <60 mL/min signify possible Chronic Kidney Disease.    Anion gap 12 5 - 15  CBC     Status:  Abnormal   Collection Time: 03/11/17  2:13 PM  Result Value Ref Range   WBC 4.7 4.0 - 10.5 K/uL   RBC 4.80 3.87 - 5.11 MIL/uL   Hemoglobin 13.4 12.0 - 15.0 g/dL   HCT 40.9 36.0 - 46.0 %   MCV 85.2 78.0 - 100.0 fL   MCH 27.9 26.0 - 34.0 pg   MCHC 32.8 30.0 - 36.0 g/dL   RDW 17.0 (H) 11.5 - 15.5 %   Platelets 216 150 - 400 K/uL  Brain natriuretic peptide     Status: Abnormal   Collection Time: 03/11/17  2:13 PM  Result Value Ref Range   B Natriuretic Peptide 4,079.6 (H) 0.0 - 100.0 pg/mL  I-stat troponin, ED     Status: None   Collection Time: 03/11/17  2:21 PM  Result Value Ref Range   Troponin i, poc 0.01 0.00 - 0.08 ng/mL   Comment 3            Comment: Due to the release kinetics of cTnI, a negative result within the first hours of the onset of symptoms does not rule out myocardial infarction with certainty. If myocardial infarction is still suspected, repeat the test at appropriate intervals.    Dg Chest 2 View  Result Date: 03/11/2017 CLINICAL DATA:  Shortness of breath for 2 weeks. EXAM: CHEST  2 VIEW COMPARISON:  12/14/2016 and prior exams FINDINGS: Cardiomegaly and pulmonary vascular congestion again noted. There is no evidence of focal airspace disease, pulmonary edema, suspicious pulmonary nodule/mass, pleural effusion, or pneumothorax. No acute bony abnormalities are identified. IMPRESSION: Cardiomegaly with pulmonary vascular congestion. Electronically Signed   By: Margarette Canada M.D.   On: 03/11/2017 15:44    Review of Systems  Constitutional: Negative for chills, diaphoresis and fever.  Eyes: Negative for blurred vision.  Respiratory: Positive for shortness of breath.   Cardiovascular: Positive for leg swelling and PND. Negative for chest pain and palpitations.  Gastrointestinal: Negative for nausea and vomiting.  Genitourinary: Negative for dysuria.  Neurological: Negative for dizziness.    Blood pressure (!) 181/109, pulse 63, temperature 97.8 F (36.6 C),  temperature source Oral, resp. rate (!) 25, SpO2 95 %. Physical Exam  HENT:  Head: Normocephalic and atraumatic.  Eyes: Conjunctivae are normal. Left eye exhibits no discharge.  Neck: Normal range of motion. Neck supple. JVD present.  Cardiovascular: Normal rate and regular rhythm.   Murmur (2/6 systolic murmur and S3 gallop noted) heard. Respiratory:  Bibasilar decreased breath sounds with faint rales noted  GI: Soft. Bowel sounds are normal. She exhibits distension. There is no rebound.  Musculoskeletal:  No clubbing cyanosis 1+ edema noted     Assessment/Plan Acute on chronic decompensated systolic congestive heart failure rule out ischemia Coronary artery disease history of anteroseptal wall myocardial infarction in the past Hypertension Diabetes mellitus GERD Hyperlipidemia Anxiety disorder Chronic kidney disease stage III History of gouty arthritis Plan As per orders   Charolette Forward, MD 03/11/2017, 4:53 PM

## 2017-03-12 LAB — MRSA PCR SCREENING: MRSA by PCR: NEGATIVE

## 2017-03-12 LAB — GLUCOSE, CAPILLARY
GLUCOSE-CAPILLARY: 160 mg/dL — AB (ref 65–99)
GLUCOSE-CAPILLARY: 148 mg/dL — AB (ref 65–99)
Glucose-Capillary: 81 mg/dL (ref 65–99)
Glucose-Capillary: 145 mg/dL — ABNORMAL HIGH (ref 65–99)

## 2017-03-12 LAB — BASIC METABOLIC PANEL
ANION GAP: 12 (ref 5–15)
BUN: 66 mg/dL — ABNORMAL HIGH (ref 6–20)
CALCIUM: 9 mg/dL (ref 8.9–10.3)
CO2: 21 mmol/L — AB (ref 22–32)
Chloride: 106 mmol/L (ref 101–111)
Creatinine, Ser: 1.8 mg/dL — ABNORMAL HIGH (ref 0.44–1.00)
GFR calc Af Amer: 33 mL/min — ABNORMAL LOW (ref >60)
GFR calc non Af Amer: 28 mL/min — ABNORMAL LOW (ref >60)
GLUCOSE: 82 mg/dL (ref 65–99)
Potassium: 4 mmol/L (ref 3.5–5.1)
Sodium: 139 mmol/L (ref 135–145)

## 2017-03-12 LAB — TROPONIN I
Troponin I: 0.03 ng/mL (ref <0.03)
Troponin I: 0.03 ng/mL (ref <0.03)

## 2017-03-12 NOTE — Progress Notes (Signed)
Discussed goal dose for Milrinone gtt with Dr. Sharyn LullHarwani. Verbal order received for goal dose of 0.6925mcg/kg/min (485ml/hr). MD verbal order to initiate goal rate at this time. Pharmacy notified; order modified to include goal dose. VSS. Will continue to monitor.

## 2017-03-12 NOTE — Progress Notes (Signed)
Subjective:  Patient denies any chest pain states breathing has improved. Denies any palpitation lightheadedness or syncope.  Objective:  Vital Signs in the last 24 hours: Temp:  [97.5 F (36.4 C)-97.9 F (36.6 C)] 97.9 F (36.6 C) (09/02 0306) Pulse Rate:  [63-101] 79 (09/02 0640) Resp:  [16-37] 19 (09/02 0306) BP: (145-181)/(80-147) 149/80 (09/02 0640) SpO2:  [95 %-99 %] 99 % (09/02 0306) Weight:  [66.1 kg (145 lb 11.6 oz)-66.9 kg (147 lb 8 oz)] 66.1 kg (145 lb 11.6 oz) (09/02 0520)  Intake/Output from previous day: 09/01 0701 - 09/02 0700 In: 120 [P.O.:120] Out: 1670 [Urine:1670] Intake/Output from this shift: Total I/O In: -  Out: 300 [Urine:300]  Physical Exam: Neck: no adenopathy, no carotid bruit and supple, symmetrical, trachea midline Lungs: Decreased breath sound at bases with faint rales Heart: regular rate and rhythm, S1, S2 normal and 2/6 systolic murmur and S3 gallop noted Abdomen: soft, non-tender; bowel sounds normal; no masses,  no organomegaly Extremities: No clubbing cyanosis 1+ edema noted  Lab Results:  Recent Labs  03/11/17 1413 03/11/17 1828  WBC 4.7 5.3  HGB 13.4 13.7  PLT 216 204    Recent Labs  03/11/17 1828 03/12/17 0621  NA 136 139  K 4.8 4.0  CL 102 106  CO2 22 21*  GLUCOSE 119* 82  BUN 69* 66*  CREATININE 2.07* 1.80*    Recent Labs  03/12/17 0019 03/12/17 0621  TROPONINI 0.03* 0.03*   Hepatic Function Panel  Recent Labs  03/11/17 1828  PROT 7.4  ALBUMIN 3.9  AST 33  ALT 10*  ALKPHOS 85  BILITOT 1.7*   No results for input(s): CHOL in the last 72 hours. No results for input(s): PROTIME in the last 72 hours.  Imaging: Imaging results have been reviewed and Dg Chest 2 View  Result Date: 03/11/2017 CLINICAL DATA:  Shortness of breath for 2 weeks. EXAM: CHEST  2 VIEW COMPARISON:  12/14/2016 and prior exams FINDINGS: Cardiomegaly and pulmonary vascular congestion again noted. There is no evidence of focal airspace  disease, pulmonary edema, suspicious pulmonary nodule/mass, pleural effusion, or pneumothorax. No acute bony abnormalities are identified. IMPRESSION: Cardiomegaly with pulmonary vascular congestion. Electronically Signed   By: Harmon PierJeffrey  Hu M.D.   On: 03/11/2017 15:44    Cardiac Studies:  Assessment/Plan:  Acute on chronic decompensated systolic congestive heart failure rule out ischemia Coronary artery disease history of anteroseptal wall myocardial infarction in the past Hypertension Diabetes mellitus GERD Hyperlipidemia Anxiety disorder Chronic kidney disease stage III History of gouty arthritis Plan Add BiDil half tablet 3 times daily Continue rest of medications   LOS: 1 day    [name], [name] 03/12/2017, 9:04 AM

## 2017-03-13 ENCOUNTER — Inpatient Hospital Stay (HOSPITAL_COMMUNITY): Payer: Medicare HMO

## 2017-03-13 DIAGNOSIS — R609 Edema, unspecified: Secondary | ICD-10-CM

## 2017-03-13 LAB — BASIC METABOLIC PANEL
Anion gap: 10 (ref 5–15)
BUN: 60 mg/dL — AB (ref 6–20)
CO2: 24 mmol/L (ref 22–32)
Calcium: 9 mg/dL (ref 8.9–10.3)
Chloride: 103 mmol/L (ref 101–111)
Creatinine, Ser: 1.82 mg/dL — ABNORMAL HIGH (ref 0.44–1.00)
GFR calc Af Amer: 32 mL/min — ABNORMAL LOW (ref >60)
GFR, EST NON AFRICAN AMERICAN: 28 mL/min — AB (ref >60)
GLUCOSE: 118 mg/dL — AB (ref 65–99)
POTASSIUM: 4.4 mmol/L (ref 3.5–5.1)
Sodium: 137 mmol/L (ref 135–145)

## 2017-03-13 LAB — GLUCOSE, CAPILLARY
GLUCOSE-CAPILLARY: 158 mg/dL — AB (ref 65–99)
GLUCOSE-CAPILLARY: 165 mg/dL — AB (ref 65–99)
Glucose-Capillary: 148 mg/dL — ABNORMAL HIGH (ref 65–99)
Glucose-Capillary: 156 mg/dL — ABNORMAL HIGH (ref 65–99)

## 2017-03-13 MED ORDER — GERHARDT'S BUTT CREAM
TOPICAL_CREAM | Freq: Two times a day (BID) | CUTANEOUS | Status: DC
  Administered 2017-03-13 – 2017-03-17 (×6): via TOPICAL
  Filled 2017-03-13 (×2): qty 1

## 2017-03-13 MED ORDER — LIVING BETTER WITH HEART FAILURE BOOK: Freq: Once | Status: DC

## 2017-03-13 NOTE — Progress Notes (Signed)
Ref: [name], MD   Subjective:  Breathing better but leg edema and pre-sacral edema persist. Also complaints of leg cramps. VS stable except respiratory distress continues. Milrinone drip continues.  Objective:  Vital Signs in the last 24 hours: Temp:  [97.3 F (36.3 C)-98.7 F (37.1 C)] 97.8 F (36.6 C) (09/03 0905) Pulse Rate:  [67-95] 80 (09/03 0905) Cardiac Rhythm: Normal sinus rhythm (09/03 0800) Resp:  [11-30] 24 (09/03 0905) BP: (144-153)/(70-88) 152/70 (09/03 0905) SpO2:  [97 %-100 %] 99 % (09/03 0435) Weight:  [66.4 kg (146 lb 6.2 oz)] 66.4 kg (146 lb 6.2 oz) (09/03 0435)  Physical Exam: BP Readings from Last 1 Encounters:  03/13/17 (!) 152/70    Wt Readings from Last 1 Encounters:  03/13/17 66.4 kg (146 lb 6.2 oz)    Weight change: -0.505 kg (-1 lb 1.8 oz) Body mass index is 25.13 kg/m. HEENT: [place]/AT, Eyes-Brown, PERL, EOMI, Conjunctiva-Pink, Sclera-Non-icteric Neck: + JVD, No bruit, Trachea midline. Lungs:  Clearing, Bilateral. Cardiac:  Regular rhythm, normal S1 and S2, no S3. II/VI systolic murmur. Abdomen:  Soft, non-tender. BS present. Extremities:  2 + lower leg and pre-sacral edema is present. No cyanosis. No clubbing. Bilateral claf tenderness. CNS: AxOx3, Cranial nerves grossly intact, moves all 4 extremities.  Skin: Warm and dry.   Intake/Output from previous day: 09/02 0701 - 09/03 0700 In: 655.6 [P.O.:600; I.V.:55.6] Out: 2750 [Urine:2750]    Lab Results: BMET    Component Value Date/Time   NA 137 03/13/2017 0459   NA 139 03/12/2017 0621   NA 136 03/11/2017 1828   K 4.4 03/13/2017 0459   K 4.0 03/12/2017 0621   K 4.8 03/11/2017 1828   CL 103 03/13/2017 0459   CL 106 03/12/2017 0621   CL 102 03/11/2017 1828   CO2 24 03/13/2017 0459   CO2 21 (L) 03/12/2017 0621   CO2 22 03/11/2017 1828   GLUCOSE 118 (H) 03/13/2017 0459   GLUCOSE 82 03/12/2017 0621   GLUCOSE 119 (H) 03/11/2017 1828   BUN 60 (H) 03/13/2017 0459   BUN 66 (H)  03/12/2017 0621   BUN 69 (H) 03/11/2017 1828   CREATININE 1.82 (H) 03/13/2017 0459   CREATININE 1.80 (H) 03/12/2017 0621   CREATININE 2.07 (H) 03/11/2017 1828   CALCIUM 9.0 03/13/2017 0459   CALCIUM 9.0 03/12/2017 0621   CALCIUM 9.0 03/11/2017 1828   GFRNONAA 28 (L) 03/13/2017 0459   GFRNONAA 28 (L) 03/12/2017 0621   GFRNONAA 24 (L) 03/11/2017 1828   GFRAA 32 (L) 03/13/2017 0459   GFRAA 33 (L) 03/12/2017 0621   GFRAA 28 (L) 03/11/2017 1828   CBC    Component Value Date/Time   WBC 5.3 03/11/2017 1828   RBC 5.00 03/11/2017 1828   HGB 13.7 03/11/2017 1828   HCT 42.7 03/11/2017 1828   PLT 204 03/11/2017 1828   MCV 85.4 03/11/2017 1828   MCH 27.4 03/11/2017 1828   MCHC 32.1 03/11/2017 1828   RDW 16.9 (H) 03/11/2017 1828   LYMPHSABS 1.5 12/14/2016 1348   MONOABS 0.5 12/14/2016 1348   EOSABS 0.0 12/14/2016 1348   BASOSABS 0.0 12/14/2016 1348   HEPATIC Function Panel  Recent Labs  12/14/16 1348 12/18/16 0236 03/11/17 1828  PROT 7.2 7.0 7.4   HEMOGLOBIN A1C No components found for: HGA1C,  MPG CARDIAC ENZYMES Lab Results  Component Value Date   CKTOTAL 59 09/07/2012   CKMB 3.2 09/07/2012   TROPONINI 0.03 (HH) 03/12/2017   TROPONINI 0.03 (HH) 03/12/2017  TROPONINI <0.03 03/11/2017   BNP No results for input(s): PROBNP in the last 8760 hours. TSH No results for input(s): TSH in the last 8760 hours. CHOLESTEROL No results for input(s): CHOL in the last 8760 hours.  Scheduled Meds: . allopurinol  100 mg Oral Daily  . aspirin EC  81 mg Oral Daily  . carvedilol  6.25 mg Oral BID WC  . cycloSPORINE  1 drop Both Eyes Daily  . furosemide  40 mg Intravenous BID  . Gerhardt's butt cream   Topical BID  . heparin  5,000 Units Subcutaneous Q8H  . insulin aspart  0-9 Units Subcutaneous TID WC  . Living Better with Heart Failure Book   Does not apply Once  . polyvinyl alcohol  1 drop Both Eyes Daily  . potassium chloride  20 mEq Oral BID  . sodium chloride flush  3 mL  Intravenous Q12H   Continuous Infusions: . sodium chloride    . 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 US lower ext. duplex R/O DVT   LOS: 2 days    Orpah CobbAjay Kylah Maresh  MD  03/13/2017, 11:48 AM

## 2017-03-13 NOTE — Progress Notes (Signed)
Pt has been refusing subQ heparin. Pt states it makes her bleed. Pt educated on indications for heparin to prophylactically prevent DVT/PE. Caswell Corwinana C Orpha Dain, RN 03/13/17 6:29 AM

## 2017-03-13 NOTE — Progress Notes (Signed)
VASCULAR LAB PRELIMINARY  PRELIMINARY  PRELIMINARY  PRELIMINARY  Bilateral lower extremity venous duplex completed.    Preliminary report:  There is no DVT or SVT noted in the bilateral lower extremity.  Doppler waveforms are pulsatile, consistent with fluid overload.   Vibhav Waddill, RVT 03/13/2017, 3:56 PM

## 2017-03-14 LAB — BASIC METABOLIC PANEL
Anion gap: 9 (ref 5–15)
BUN: 60 mg/dL — AB (ref 6–20)
CALCIUM: 8.5 mg/dL — AB (ref 8.9–10.3)
CO2: 25 mmol/L (ref 22–32)
CREATININE: 1.82 mg/dL — AB (ref 0.44–1.00)
Chloride: 101 mmol/L (ref 101–111)
GFR calc Af Amer: 32 mL/min — ABNORMAL LOW (ref >60)
GFR, EST NON AFRICAN AMERICAN: 28 mL/min — AB (ref >60)
GLUCOSE: 181 mg/dL — AB (ref 65–99)
Potassium: 4.2 mmol/L (ref 3.5–5.1)
SODIUM: 135 mmol/L (ref 135–145)

## 2017-03-14 LAB — GLUCOSE, CAPILLARY
GLUCOSE-CAPILLARY: 117 mg/dL — AB (ref 65–99)
Glucose-Capillary: 146 mg/dL — ABNORMAL HIGH (ref 65–99)
Glucose-Capillary: 105 mg/dL — ABNORMAL HIGH (ref 65–99)
Glucose-Capillary: 139 mg/dL — ABNORMAL HIGH (ref 65–99)

## 2017-03-14 MED ORDER — GLIMEPIRIDE 1 MG PO TABS
1.0000 mg | ORAL_TABLET | Freq: Every day | ORAL | Status: DC
  Administered 2017-03-14 – 2017-03-17 (×4): 1 mg via ORAL
  Filled 2017-03-14 (×4): qty 1

## 2017-03-14 NOTE — Progress Notes (Signed)
Ref: [name], Zyiah Withington, MD   Subjective:  Respiratory distress continues. Afebrile. On milrinone drip. No calf DVT, bilateral.  Objective:  Vital Signs in the last 24 hours: Temp:  [97.8 F (36.6 C)-98.7 F (37.1 C)] 98.7 F (37.1 C) (09/04 0700) Pulse Rate:  [67-82] 67 (09/04 0625) Cardiac Rhythm: Normal sinus rhythm (09/04 0700) Resp:  [19-25] 23 (09/04 0625) BP: (70-152)/(51-88) 151/84 (09/04 0625) SpO2:  [97 %-100 %] 97 % (09/04 0625) Weight:  [66.3 kg (146 lb 2.6 oz)] 66.3 kg (146 lb 2.6 oz) (09/04 [id number])  Physical Exam: BP Readings from Last 1 Encounters:  03/14/17 (!) 151/84    Wt Readings from Last 1 Encounters:  03/14/17 66.3 kg (146 lb 2.6 oz)    Weight change: -0.1 kg (-3.5 oz) Body mass index is 25.09 kg/m. HEENT: [place]/AT, Eyes-Brown, PERL, EOMI, Conjunctiva-Pink, Sclera-Non-icteric Neck: + JVD, No bruit, Trachea midline. Lungs:  Clear, Bilateral. Cardiac:  Regular rhythm, normal S1 and S2, no S3. II/VI systolic murmur. Abdomen:  Soft, non-tender. BS present. Extremities:  2 + edema present. No cyanosis. No clubbing. CNS: AxOx3, Cranial nerves grossly intact, moves all 4 extremities.  Skin: Warm and dry.   Intake/Output from previous day: 09/03 0701 - 09/04 0700 In: 1062 [P.O.:924; I.V.:138] Out: 2525 [Urine:2525]    Lab Results: BMET    Component Value Date/Time   NA 135 03/14/2017 0236   NA 137 03/13/2017 0459   NA 139 03/12/2017 0621   K 4.2 03/14/2017 0236   K 4.4 03/13/2017 0459   K 4.0 03/12/2017 0621   CL 101 03/14/2017 0236   CL 103 03/13/2017 0459   CL 106 03/12/2017 0621   CO2 25 03/14/2017 0236   CO2 24 03/13/2017 0459   CO2 21 (L) 03/12/2017 0621   GLUCOSE 181 (H) 03/14/2017 0236   GLUCOSE 118 (H) 03/13/2017 0459   GLUCOSE 82 03/12/2017 0621   BUN 60 (H) 03/14/2017 0236   BUN 60 (H) 03/13/2017 0459   BUN 66 (H) 03/12/2017 0621   CREATININE 1.82 (H) 03/14/2017 0236   CREATININE 1.82 (H) 03/13/2017 0459   CREATININE 1.80 (H)  03/12/2017 0621   CALCIUM 8.5 (L) 03/14/2017 0236   CALCIUM 9.0 03/13/2017 0459   CALCIUM 9.0 03/12/2017 0621   GFRNONAA 28 (L) 03/14/2017 0236   GFRNONAA 28 (L) 03/13/2017 0459   GFRNONAA 28 (L) 03/12/2017 0621   GFRAA 32 (L) 03/14/2017 0236   GFRAA 32 (L) 03/13/2017 0459   GFRAA 33 (L) 03/12/2017 0621   CBC    Component Value Date/Time   WBC 5.3 03/11/2017 1828   RBC 5.00 03/11/2017 1828   HGB 13.7 03/11/2017 1828   HCT 42.7 03/11/2017 1828   PLT 204 03/11/2017 1828   MCV 85.4 03/11/2017 1828   MCH 27.4 03/11/2017 1828   MCHC 32.1 03/11/2017 1828   RDW 16.9 (H) 03/11/2017 1828   LYMPHSABS 1.5 12/14/2016 1348   MONOABS 0.5 12/14/2016 1348   EOSABS 0.0 12/14/2016 1348   BASOSABS 0.0 12/14/2016 1348   HEPATIC Function Panel  Recent Labs  12/14/16 1348 12/18/16 0236 03/11/17 1828  PROT 7.2 7.0 7.4   HEMOGLOBIN A1C No components found for: HGA1C,  MPG CARDIAC ENZYMES Lab Results  Component Value Date   CKTOTAL 59 09/07/2012   CKMB 3.2 09/07/2012   TROPONINI 0.03 (HH) 03/12/2017   TROPONINI 0.03 (HH) 03/12/2017   TROPONINI <0.03 03/11/2017   BNP No results for input(s): PROBNP in the last 8760 hours. TSH No results for input(s):  TSH in the last 8760 hours. CHOLESTEROL No results for input(s): CHOL in the last 8760 hours.  Scheduled Meds: . allopurinol  100 mg Oral Daily  . aspirin EC  81 mg Oral Daily  . carvedilol  6.25 mg Oral BID WC  . cycloSPORINE  1 drop Both Eyes Daily  . furosemide  40 mg Intravenous BID  . Gerhardt's butt cream   Topical BID  . glimepiride  1 mg Oral Q breakfast  . heparin  5,000 Units Subcutaneous Q8H  . insulin aspart  0-9 Units Subcutaneous TID WC  . Living Better with Heart Failure Book   Does not apply Once  . polyvinyl alcohol  1 drop Both Eyes Daily  . potassium chloride  20 mEq Oral BID  . sodium chloride flush  3 mL Intravenous Q12H   Continuous Infusions: . sodium chloride    . 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   LOS: 3 days    [name]  MD  03/14/2017, 8:27 AM

## 2017-03-14 NOTE — Plan of Care (Signed)
Problem: Activity: Goal: Risk for activity intolerance will decrease Outcome: Progressing Patient ambulated in the [place]

## 2017-03-14 NOTE — Care Management Note (Addendum)
Case Management Note  Patient Details  Name: [name] MRN: [id number] Date of Birth: 06/27/1951  Subjective/Objective:  Pt admitted with SOB - hx of biventricular HF                  Action/Plan:   PTA from home with spouse.  Pt is currently on Milrinone and IV lasix.  CM will continue to follow for discharge needs   Expected Discharge Date:                  Expected Discharge Plan:  Home w Home Health Services  In-House Referral:     Discharge planning Services  CM Consult  Post Acute Care Choice:    Choice offered to:     DME Arranged:    DME Agency:     HH Arranged:    HH Agency:     Status of Service:     If discussed at [employer] Meetings, dates discussed:    Additional Comments: [facility] - however pt is no longer active and does not wish to resume services with the agency.  Pt is interested with initiating services with another agency if recommended- CM will provide list for preparation of discharge.  Pt remains on Milrinone drip and states she will not go home with drip.  Pt states she is no longer daily weighing (pt still has equipment) - CM explained the importance of daily weights and adhering to low salt diet. Pt independently ambulates in the room - PT/OT eval not determined to be needed.  CM will continue to follow for discharge needs [name], [name], RN 03/14/2017, 9:42 AM

## 2017-03-15 LAB — BASIC METABOLIC PANEL
ANION GAP: 9 (ref 5–15)
BUN: 62 mg/dL — AB (ref 6–20)
CO2: 25 mmol/L (ref 22–32)
CREATININE: 1.73 mg/dL — AB (ref 0.44–1.00)
Calcium: 8.8 mg/dL — ABNORMAL LOW (ref 8.9–10.3)
Chloride: 103 mmol/L (ref 101–111)
GFR calc Af Amer: 35 mL/min — ABNORMAL LOW (ref >60)
GFR, EST NON AFRICAN AMERICAN: 30 mL/min — AB (ref >60)
GLUCOSE: 72 mg/dL (ref 65–99)
Potassium: 4.5 mmol/L (ref 3.5–5.1)
Sodium: 137 mmol/L (ref 135–145)

## 2017-03-15 LAB — GLUCOSE, CAPILLARY
GLUCOSE-CAPILLARY: 119 mg/dL — AB (ref 65–99)
Glucose-Capillary: 97 mg/dL (ref 65–99)
Glucose-Capillary: 133 mg/dL — ABNORMAL HIGH (ref 65–99)

## 2017-03-15 NOTE — Progress Notes (Signed)
Ref: [name], MD   Subjective:  Fair diuresis.  Milrinone drip continues. Exertional dyspnea continues. No calf DVT by duplex study.  Objective:  Vital Signs in the last 24 hours: Temp:  [97.1 F (36.2 C)-98.3 F (36.8 C)] 97.1 F (36.2 C) (09/05 0835) Pulse Rate:  [70-77] 75 (09/05 0835) Cardiac Rhythm: Normal sinus rhythm (09/05 0700) Resp:  [18-22] 22 (09/05 0835) BP: (129-154)/(64-82) 146/77 (09/05 0835) SpO2:  [97 %-99 %] 98 % (09/05 0835) Weight:  [63.8 kg (140 lb 10.5 oz)] 63.8 kg (140 lb 10.5 oz) (09/05 0408)  Physical Exam: BP Readings from Last 1 Encounters:  03/15/17 (!) 146/77    Wt Readings from Last 1 Encounters:  03/15/17 63.8 kg (140 lb 10.5 oz)    Weight change: -2.5 kg (-5 lb 8.2 oz) Body mass index is 24.14 kg/m. HEENT: [place]/AT, Eyes-Brown, PERL, EOMI, Conjunctiva-Pink, Sclera-Non-icteric Neck: No JVD, No bruit, Trachea midline. Lungs:  Clear, Bilateral. Cardiac:  Regular rhythm, normal S1 and S2, no S3. II/VI systolic murmur. Abdomen:  Soft, non-tender. BS present. Extremities:  2 + lower leg and pre-sacral edema present. No cyanosis. No clubbing. Calf tenderness persist. CNS: AxOx3, Cranial nerves grossly intact, moves all 4 extremities.  Skin: Warm and dry.   Intake/Output from previous day: 09/04 0701 - 09/05 0700 In: 545.8 [P.O.:462; I.V.:83.8] Out: 2500 [Urine:2500]    Lab Results: BMET    Component Value Date/Time   NA 137 03/15/2017 0320   NA 135 03/14/2017 0236   NA 137 03/13/2017 0459   K 4.5 03/15/2017 0320   K 4.2 03/14/2017 0236   K 4.4 03/13/2017 0459   CL 103 03/15/2017 0320   CL 101 03/14/2017 0236   CL 103 03/13/2017 0459   CO2 25 03/15/2017 0320   CO2 25 03/14/2017 0236   CO2 24 03/13/2017 0459   GLUCOSE 72 03/15/2017 0320   GLUCOSE 181 (H) 03/14/2017 0236   GLUCOSE 118 (H) 03/13/2017 0459   BUN 62 (H) 03/15/2017 0320   BUN 60 (H) 03/14/2017 0236   BUN 60 (H) 03/13/2017 0459   CREATININE 1.73 (H) 03/15/2017  0320   CREATININE 1.82 (H) 03/14/2017 0236   CREATININE 1.82 (H) 03/13/2017 0459   CALCIUM 8.8 (L) 03/15/2017 0320   CALCIUM 8.5 (L) 03/14/2017 0236   CALCIUM 9.0 03/13/2017 0459   GFRNONAA 30 (L) 03/15/2017 0320   GFRNONAA 28 (L) 03/14/2017 0236   GFRNONAA 28 (L) 03/13/2017 0459   GFRAA 35 (L) 03/15/2017 0320   GFRAA 32 (L) 03/14/2017 0236   GFRAA 32 (L) 03/13/2017 0459   CBC    Component Value Date/Time   WBC 5.3 03/11/2017 1828   RBC 5.00 03/11/2017 1828   HGB 13.7 03/11/2017 1828   HCT 42.7 03/11/2017 1828   PLT 204 03/11/2017 1828   MCV 85.4 03/11/2017 1828   MCH 27.4 03/11/2017 1828   MCHC 32.1 03/11/2017 1828   RDW 16.9 (H) 03/11/2017 1828   LYMPHSABS 1.5 12/14/2016 1348   MONOABS 0.5 12/14/2016 1348   EOSABS 0.0 12/14/2016 1348   BASOSABS 0.0 12/14/2016 1348   HEPATIC Function Panel  Recent Labs  12/14/16 1348 12/18/16 0236 03/11/17 1828  PROT 7.2 7.0 7.4   HEMOGLOBIN A1C No components found for: HGA1C,  MPG CARDIAC ENZYMES Lab Results  Component Value Date   CKTOTAL 59 09/07/2012   CKMB 3.2 09/07/2012   TROPONINI 0.03 (HH) 03/12/2017   TROPONINI 0.03 (HH) 03/12/2017   TROPONINI <0.03 03/11/2017   BNP No results  for input(s): PROBNP in the last 8760 hours. TSH No results for input(s): TSH in the last 8760 hours. CHOLESTEROL No results for input(s): CHOL in the last 8760 hours.  Scheduled Meds: . allopurinol  100 mg Oral Daily  . aspirin EC  81 mg Oral Daily  . carvedilol  6.25 mg Oral BID WC  . cycloSPORINE  1 drop Both Eyes Daily  . furosemide  40 mg Intravenous BID  . Gerhardt's butt cream   Topical BID  . glimepiride  1 mg Oral Q breakfast  . heparin  5,000 Units Subcutaneous Q8H  . insulin aspart  0-9 Units Subcutaneous TID WC  . Living Better with Heart Failure Book   Does not apply Once  . polyvinyl alcohol  1 drop Both Eyes Daily  . potassium chloride  20 mEq Oral BID  . sodium chloride flush  3 mL Intravenous Q12H   Continuous  Infusions: . sodium chloride    . milrinone 0.25 mcg/kg/min (03/14/17 1646)   PRN Meds:.sodium chloride, acetaminophen, albuterol, nitroGLYCERIN, ondansetron (ZOFRAN) IV, sodium chloride flush  Assessment/Plan: Acute on chronic decompendated biventricular systolic heart failure CAD Old MI Hypertension Type II DM GERD CKD, III H/O gout Bilateral calf tenderness  Continue milrinone drip Increase activity as tolerated.   LOS: 4 days    Orpah CobbAjay Rosalyn Archambault  MD  03/15/2017, 10:25 AM

## 2017-03-16 LAB — BASIC METABOLIC PANEL
Anion gap: 9 (ref 5–15)
BUN: 63 mg/dL — ABNORMAL HIGH (ref 6–20)
CALCIUM: 9 mg/dL (ref 8.9–10.3)
CHLORIDE: 103 mmol/L (ref 101–111)
CO2: 25 mmol/L (ref 22–32)
Creatinine, Ser: 1.58 mg/dL — ABNORMAL HIGH (ref 0.44–1.00)
GFR calc Af Amer: 39 mL/min — ABNORMAL LOW (ref >60)
GFR calc non Af Amer: 33 mL/min — ABNORMAL LOW (ref >60)
Glucose, Bld: 62 mg/dL — ABNORMAL LOW (ref 65–99)
Potassium: 4.4 mmol/L (ref 3.5–5.1)
Sodium: 137 mmol/L (ref 135–145)

## 2017-03-16 LAB — GLUCOSE, CAPILLARY
GLUCOSE-CAPILLARY: 70 mg/dL (ref 65–99)
Glucose-Capillary: 89 mg/dL (ref 65–99)
Glucose-Capillary: 88 mg/dL (ref 65–99)
Glucose-Capillary: 70 mg/dL (ref 65–99)

## 2017-03-16 NOTE — Progress Notes (Signed)
Ref: [name], MD   Subjective:  Decreasing diuresis. Improving exertional dyspnea. Afebrile.  Objective:  Vital Signs in the last 24 hours: Temp:  [97.9 F (36.6 C)-98.2 F (36.8 C)] 97.9 F (36.6 C) (09/06 1926) Pulse Rate:  [73-99] 74 (09/06 1926) Cardiac Rhythm: Normal sinus rhythm (09/06 1930) Resp:  [16-25] 17 (09/06 1926) BP: (130-156)/(70-87) 156/87 (09/06 1926) SpO2:  [98 %] 98 % (09/06 1926) Weight:  [65.1 kg (143 lb 8.3 oz)] 65.1 kg (143 lb 8.3 oz) (09/06 0416)  Physical Exam: BP Readings from Last 1 Encounters:  03/16/17 (!) 156/87    Wt Readings from Last 1 Encounters:  03/16/17 65.1 kg (143 lb 8.3 oz)    Weight change: 1.3 kg (2 lb 13.9 oz) Body mass index is 24.64 kg/m. HEENT: [place]/AT, Eyes-Brown, PERL, EOMI, Conjunctiva-Pink, Sclera-Non-icteric Neck: No JVD, No bruit, Trachea midline. Lungs:  Clear, Bilateral. Cardiac:  Regular rhythm, normal S1 and S2, no S3. II/VI systolic murmur. Abdomen:  Soft, non-tender. BS present. Extremities:  1-2 + lower leg and trace pre-sacral edema present. No cyanosis. No clubbing. Calf tenderness as before. CNS: AxOx3, Cranial nerves grossly intact, moves all 4 extremities.  Skin: Warm and dry.   Intake/Output from previous day: 09/05 0701 - 09/06 0700 In: 877 [P.O.:702; I.V.:175] Out: 2200 [Urine:2200]    Lab Results: BMET    Component Value Date/Time   NA 137 03/16/2017 0404   NA 137 03/15/2017 0320   NA 135 03/14/2017 0236   K 4.4 03/16/2017 0404   K 4.5 03/15/2017 0320   K 4.2 03/14/2017 0236   CL 103 03/16/2017 0404   CL 103 03/15/2017 0320   CL 101 03/14/2017 0236   CO2 25 03/16/2017 0404   CO2 25 03/15/2017 0320   CO2 25 03/14/2017 0236   GLUCOSE 62 (L) 03/16/2017 0404   GLUCOSE 72 03/15/2017 0320   GLUCOSE 181 (H) 03/14/2017 0236   BUN 63 (H) 03/16/2017 0404   BUN 62 (H) 03/15/2017 0320   BUN 60 (H) 03/14/2017 0236   CREATININE 1.58 (H) 03/16/2017 0404   CREATININE 1.73 (H) 03/15/2017 0320    CREATININE 1.82 (H) 03/14/2017 0236   CALCIUM 9.0 03/16/2017 0404   CALCIUM 8.8 (L) 03/15/2017 0320   CALCIUM 8.5 (L) 03/14/2017 0236   GFRNONAA 33 (L) 03/16/2017 0404   GFRNONAA 30 (L) 03/15/2017 0320   GFRNONAA 28 (L) 03/14/2017 0236   GFRAA 39 (L) 03/16/2017 0404   GFRAA 35 (L) 03/15/2017 0320   GFRAA 32 (L) 03/14/2017 0236   CBC    Component Value Date/Time   WBC 5.3 03/11/2017 1828   RBC 5.00 03/11/2017 1828   HGB 13.7 03/11/2017 1828   HCT 42.7 03/11/2017 1828   PLT 204 03/11/2017 1828   MCV 85.4 03/11/2017 1828   MCH 27.4 03/11/2017 1828   MCHC 32.1 03/11/2017 1828   RDW 16.9 (H) 03/11/2017 1828   LYMPHSABS 1.5 12/14/2016 1348   MONOABS 0.5 12/14/2016 1348   EOSABS 0.0 12/14/2016 1348   BASOSABS 0.0 12/14/2016 1348   HEPATIC Function Panel  Recent Labs  12/14/16 1348 12/18/16 0236 03/11/17 1828  PROT 7.2 7.0 7.4   HEMOGLOBIN A1C No components found for: HGA1C,  MPG CARDIAC ENZYMES Lab Results  Component Value Date   CKTOTAL 59 09/07/2012   CKMB 3.2 09/07/2012   TROPONINI 0.03 (HH) 03/12/2017   TROPONINI 0.03 (HH) 03/12/2017   TROPONINI <0.03 03/11/2017   BNP No results for input(s): PROBNP in the last 8760 hours.  TSH No results for input(s): TSH in the last 8760 hours. CHOLESTEROL No results for input(s): CHOL in the last 8760 hours.  Scheduled Meds: . allopurinol  100 mg Oral Daily  . aspirin EC  81 mg Oral Daily  . carvedilol  6.25 mg Oral BID WC  . cycloSPORINE  1 drop Both Eyes Daily  . furosemide  40 mg Intravenous BID  . Gerhardt's butt cream   Topical BID  . glimepiride  1 mg Oral Q breakfast  . heparin  5,000 Units Subcutaneous Q8H  . insulin aspart  0-9 Units Subcutaneous TID WC  . Living Better with Heart Failure Book   Does not apply Once  . polyvinyl alcohol  1 drop Both Eyes Daily  . potassium chloride  20 mEq Oral BID  . sodium chloride flush  3 mL Intravenous Q12H   Continuous Infusions: . sodium chloride    . milrinone  0.25 mcg/kg/min (03/15/17 1600)   PRN Meds:.sodium chloride, acetaminophen, albuterol, nitroGLYCERIN, ondansetron (ZOFRAN) IV, sodium chloride flush  Assessment/Plan: Acute on chronic decompensated biventricular systolic heart failure CAD Old MI Hypertension Type 2 diabetes mellitus Gastroesophageal reflux disease CKD, 3 History of gout Bilateral calf tenderness  Continue diuresis. Increase activity. Blood work in AM   LOS: 5 days    Orpah CobbAjay Ethelmae Ringel  MD  03/16/2017, 9:37 PM

## 2017-03-16 NOTE — Care Management Note (Addendum)
Case Management Note  Patient Details  Name: Tanya SpikeMargaret A [name] MRN: [id number] Date of Birth: [birth year]  Subjective/Objective:  Pt admitted with SOB - hx of biventricular HF                  Action/Plan:   PTA from home with spouse.  Pt is currently on Milrinone and IV lasix.  CM will continue to follow for discharge needs   Expected Discharge Date:                  Expected Discharge Plan:  Home w Home Health Services  In-House Referral:     Discharge planning Services  CM Consult, Other - See comment ([facility]  Post Acute Care Choice:    Choice offered to:  Patient  DME Arranged:    DME Agency:     HH Arranged:  RN, Disease Management, PT, OT HH [facility]  Status of Service:  In process, will continue to follow  If discussed at Long Length of Stay Meetings, dates discussed:    Additional Comments: 03/16/2017 Discussed in LOS 03/16/17 - pt remains appropriate for continued stay.  Pt deemed [facility]RI with [facility] today - agency accepted referral.  [facility]  CM discussed compliance with pt regarding HF recommendations and agency request.  [facility] however refuses recommended IV lM lasix (per Dr Jacky KindleAronson) - CM informed attending  03/15/17 [facility] - however pt is no longer active and does not wish to resume services with the agency.  Pt is interested with initiating services with another agency if recommended- CM will provide list for preparation of discharge.  Pt remains on Milrinone drip and states she will not go home with drip.  Pt states she is no longer daily weighing (pt still has equipment) - CM explained the importance of daily weights and adhering to low salt diet. Pt independently ambulates in the room - PT/OT eval not determined to be needed.  CM will continue to follow for discharge needs [name], Renne Platts S, RN 03/16/2017, 11:54 AM

## 2017-03-17 LAB — CBC
HCT: 38.9 % (ref 36.0–46.0)
Hemoglobin: 12.2 g/dL (ref 12.0–15.0)
MCH: 26.9 pg (ref 26.0–34.0)
MCHC: 31.4 g/dL (ref 30.0–36.0)
MCV: 85.9 fL (ref 78.0–100.0)
PLATELETS: 194 10*3/uL (ref 150–400)
RBC: 4.53 MIL/uL (ref 3.87–5.11)
RDW: 16.2 % — AB (ref 11.5–15.5)
WBC: 4.6 10*3/uL (ref 4.0–10.5)

## 2017-03-17 LAB — GLUCOSE, CAPILLARY
GLUCOSE-CAPILLARY: 97 mg/dL (ref 65–99)
Glucose-Capillary: 62 mg/dL — ABNORMAL LOW (ref 65–99)

## 2017-03-17 LAB — COMPREHENSIVE METABOLIC PANEL
ALBUMIN: 3.3 g/dL — AB (ref 3.5–5.0)
ALT: 9 U/L — ABNORMAL LOW (ref 14–54)
AST: 19 U/L (ref 15–41)
Alkaline Phosphatase: 88 U/L (ref 38–126)
Anion gap: 12 (ref 5–15)
BUN: 66 mg/dL — AB (ref 6–20)
CHLORIDE: 103 mmol/L (ref 101–111)
CO2: 22 mmol/L (ref 22–32)
CREATININE: 1.81 mg/dL — AB (ref 0.44–1.00)
Calcium: 9.1 mg/dL (ref 8.9–10.3)
GFR calc Af Amer: 33 mL/min — ABNORMAL LOW (ref >60)
GFR, EST NON AFRICAN AMERICAN: 28 mL/min — AB (ref >60)
Glucose, Bld: 88 mg/dL (ref 65–99)
POTASSIUM: 4.2 mmol/L (ref 3.5–5.1)
SODIUM: 137 mmol/L (ref 135–145)
Total Bilirubin: 1.2 mg/dL (ref 0.3–1.2)
Total Protein: 7.1 g/dL (ref 6.5–8.1)

## 2017-03-17 LAB — BRAIN NATRIURETIC PEPTIDE: B NATRIURETIC PEPTIDE 5: 774.5 pg/mL — AB (ref 0.0–100.0)

## 2017-03-17 MED ORDER — FUROSEMIDE 80 MG PO TABS: 80.0000 mg | ORAL_TABLET | Freq: Two times a day (BID) | ORAL | Status: DC

## 2017-03-17 MED ORDER — METOLAZONE 5 MG PO TABS: 5.0000 mg | ORAL_TABLET | ORAL | 3 refills | Status: DC

## 2017-03-17 NOTE — Discharge Summary (Signed)
Physician Discharge Summary  Patient ID: [name] MRN: [id number] DOB/AGE: 1950/11/14 66 y.o.  Admit date: 03/11/2017 Discharge date: 03/17/2017  Admission Diagnoses: Acute on chronic decompensated systolic heart failure CAD Hypertension DM, II Hyperlipidemia GERD CKD, III H/O gouty arthritis  Discharge Diagnoses:  Principle problem: Acute on chronic left heart systolic failure Active Problems:   Acute on chronic right heart systolic failure   Hypertension   CAD   DM, II, diet controlled   Hyperlipidemia   GERD   CKD, III   H/O gouty arthritis  Discharged Condition: fair  Hospital Course: 66 year old female with PMH of CAD, CHF, Hypertension, arthritis, CKD, II, GERD had leg swelling for 2 weeks and increasing shortness of breath. She needed IV milrinone drip and IV lasix for slow and steady diuresis without significant change in renal function. Patient agrees to follow low fat, low salt and 1200 cc fluid restriction. She will be followed by me in 1 week.  Consults: cardiology  Significant Diagnostic Studies: labs: Normal CBC, BMET normal except BUN 60 and Cr- 1.8. BNP 2,822 on admission and 774.5 on discharge day.  Treatments: cardiac meds: aspirin, carvedilol, metolazone, furosemide and potassium  Discharge Exam: Blood pressure 131/70, pulse 72, temperature 98 F (36.7 C), temperature source Oral, resp. rate 15, height  (1.626 m), weight 63.9 kg (140 lb 14 oz), SpO2 93 %. General appearance: alert, cooperative and appears stated age. Head: Normocephalic, atraumatic. Eyes: Brown eyes, pink conjunctiva, corneas clear. PERRL, EOM's intact.  Neck: No adenopathy, no carotid bruit, no JVD, supple, symmetrical, trachea midline and thyroid not enlarged. Resp: Clear to auscultation bilaterally. Cardio: Regular rate and rhythm, S1, S2 normal, II/VI systolic murmur, no click, rub or gallop. GI: Soft, non-tender; bowel sounds normal; no organomegaly. Extremities: Trace  both lower leg edema, cyanosis or clubbing. Skin: Warm and dry.  Neurologic: Alert and oriented X 3, normal strength and tone. Normal coordination and gait.  Disposition: 06-Home-Health Care Svc   Allergies as of 03/17/2017      Reactions   Bidil [isosorb Dinitrate-hydralazine] Itching, Other (See Comments)   Jittery, feels like something is crawling      Medication List    STOP taking these medications   digoxin 0.125 MG tablet Commonly known as:  LANOXIN   hydrocerin Crea   losartan 50 MG tablet Commonly known as:  COZAAR     TAKE these medications   acetaminophen 500 MG tablet Commonly known as:  TYLENOL Take 500 mg by mouth every 6 (six) hours as needed for moderate pain or headache.   albuterol 108 (90 Base) MCG/ACT inhaler Commonly known as:  PROVENTIL HFA;VENTOLIN HFA Inhale 2 puffs into the lungs every 6 (six) hours as needed for wheezing or shortness of breath.   allopurinol 100 MG tablet Commonly known as:  ZYLOPRIM Take 1 tablet (100 mg total) by mouth daily.   aspirin EC 81 MG tablet Take 81 mg by mouth daily.   carvedilol 6.25 MG tablet Commonly known as:  COREG Take 1 tablet (6.25 mg total) by mouth 2 (two) times daily with a meal.   collagenase ointment Commonly known as:  SANTYL Apply 1 application topically daily.   cycloSPORINE 0.05 % ophthalmic emulsion Commonly known as:  RESTASIS Place 1 drop into both eyes daily.   furosemide 80 MG tablet Commonly known as:  LASIX Take 1 tablet (80 mg total) by mouth 2 (two) times daily. What changed:  when to take this  additional instructions  GONIOVISC 2.5 % ophthalmic solution Generic drug:  hydroxypropyl methylcellulose / hypromellose Place 1 drop into both eyes daily.   metolazone 5 MG tablet Commonly known as:  ZAROXOLYN Take 1 tablet (5 mg total) by mouth every Monday, Wednesday, and Friday. One hour before AM furosemide - generic Lasix   nitroGLYCERIN 0.4 MG SL tablet Commonly known  as:  NITROSTAT Place 0.4 mg under the tongue every 5 (five) minutes as needed for chest pain.   potassium chloride 10 MEQ tablet Commonly known as:  K-DUR,KLOR-CON Take 1 tablet (10 mEq total) by mouth daily.            Discharge Care Instructions        Start     Ordered   03/17/17 0000  metolazone (ZAROXOLYN) 5 MG tablet  Every M-W-F     03/17/17 1256   03/17/17 0000  furosemide (LASIX) 80 MG tablet  2 times daily     03/17/17 1256     Follow-up Information    [name], Tanya Sailer, MD. Schedule an appointment as soon as possible for a visit in 1 week(s).   Specialty:  Cardiology Contact information: [address] [place] [address] [phone]           Signed: [name],Martinique Pizzimenti S 03/17/2017, 12:57 PM

## 2017-03-17 NOTE — Care Management Note (Addendum)
Case Management Note  Patient Details  Name: [name] MRN: [id number] Date of Birth: Aug 29, 1950  Subjective/Objective:  Pt admitted with SOB - hx of biventricular HF                  Action/Plan:   PTA from home with spouse.  Pt is currently on Milrinone and IV lasix.  CM will continue to follow for discharge needs   Expected Discharge Date:  03/17/17               Expected Discharge Plan:  Home w Home Health Services  In-House Referral:     Discharge planning Services  CM Consult, Other - See comment [facility]  Post Acute Care Choice:    Choice offered to:  Patient  DME Arranged:    DME Agency:     HH Arranged:  RN, Disease Management HH Agency[facility]  [facility]  Status of Service:  In process, will continue to follow  If discussed at Long Length of Stay Meetings, dates discussed:    Additional Comments: 03/17/2017  Pt is now off milrinone - plan is for pt to discharge home today.  [facility] will [facility] today.  [facility] program).  Pt is independently ambulating in room, no issues with ADLS.    03/16/17 Discussed in LOS 03/16/17 - pt remains appropriate for continued stay.  Pt deemed [facility]RI with [facility] today - agency accepted referral.  [facility] and [facility]  CM discussed compliance with pt regarding HF recommendations and agency request.  [facility] however refuses recommended IV lM lasix (per Dr Jacky KindleAronson) - CM informed attending, Dr Jacky KindleAronson and [facility]  03/15/17 [facility] - however pt is no longer active and does not wish to resume services with the agency.  Pt is interested with initiating services with another agency if recommended- CM will provide list for preparation of discharge.  Pt remains on Milrinone drip and states she will not go home with drip.  Pt states she is no longer daily weighing (pt  still has equipment) - CM explained the importance of daily weights and adhering to low salt diet. Pt independently ambulates in the room - PT/OT eval not determined to be needed.  CM will continue to follow for discharge needs [name], Etai Copado S, RN 03/17/2017, 1:41 PM

## 2017-05-21 ENCOUNTER — Inpatient Hospital Stay (HOSPITAL_COMMUNITY): Payer: Medicare HMO

## 2017-05-21 ENCOUNTER — Inpatient Hospital Stay (HOSPITAL_COMMUNITY)
Admission: AD | Admit: 2017-05-21 | Discharge: 2017-05-30 | DRG: 291 | Disposition: A | Discharge status: Home Health | Payer: Medicare HMO | Source: Ambulatory Visit | Attending: Cardiovascular Disease | Admitting: Cardiovascular Disease

## 2017-05-21 DIAGNOSIS — I248 Other forms of acute ischemic heart disease: Secondary | ICD-10-CM | POA: Diagnosis present

## 2017-05-21 DIAGNOSIS — I5082 Biventricular heart failure: Secondary | ICD-10-CM | POA: Diagnosis present

## 2017-05-21 DIAGNOSIS — Z7982 Long term (current) use of aspirin: Secondary | ICD-10-CM | POA: Diagnosis not present

## 2017-05-21 DIAGNOSIS — Z87891 Personal history of nicotine dependence: Secondary | ICD-10-CM | POA: Diagnosis not present

## 2017-05-21 DIAGNOSIS — Z6826 Body mass index (BMI) 26.0-26.9, adult: Secondary | ICD-10-CM | POA: Diagnosis not present

## 2017-05-21 DIAGNOSIS — I130 Hypertensive heart and chronic kidney disease with heart failure and stage 1 through stage 4 chronic kidney disease, or unspecified chronic kidney disease: Principal | ICD-10-CM | POA: Diagnosis present

## 2017-05-21 DIAGNOSIS — I454 Nonspecific intraventricular block: Secondary | ICD-10-CM | POA: Diagnosis present

## 2017-05-21 DIAGNOSIS — Z79899 Other long term (current) drug therapy: Secondary | ICD-10-CM

## 2017-05-21 DIAGNOSIS — Z888 Allergy status to other drugs, medicaments and biological substances status: Secondary | ICD-10-CM

## 2017-05-21 DIAGNOSIS — I081 Rheumatic disorders of both mitral and tricuspid valves: Secondary | ICD-10-CM | POA: Diagnosis present

## 2017-05-21 DIAGNOSIS — I2510 Atherosclerotic heart disease of native coronary artery without angina pectoris: Secondary | ICD-10-CM | POA: Diagnosis present

## 2017-05-21 DIAGNOSIS — E1122 Type 2 diabetes mellitus with diabetic chronic kidney disease: Secondary | ICD-10-CM | POA: Diagnosis present

## 2017-05-21 DIAGNOSIS — I509 Heart failure, unspecified: Secondary | ICD-10-CM

## 2017-05-21 DIAGNOSIS — E7800 Pure hypercholesterolemia, unspecified: Secondary | ICD-10-CM | POA: Diagnosis present

## 2017-05-21 DIAGNOSIS — M109 Gout, unspecified: Secondary | ICD-10-CM | POA: Diagnosis present

## 2017-05-21 DIAGNOSIS — E440 Moderate protein-calorie malnutrition: Secondary | ICD-10-CM | POA: Diagnosis present

## 2017-05-21 DIAGNOSIS — R011 Cardiac murmur, unspecified: Secondary | ICD-10-CM | POA: Diagnosis present

## 2017-05-21 DIAGNOSIS — N183 Chronic kidney disease, stage 3 (moderate): Secondary | ICD-10-CM | POA: Diagnosis present

## 2017-05-21 DIAGNOSIS — E1129 Type 2 diabetes mellitus with other diabetic kidney complication: Secondary | ICD-10-CM

## 2017-05-21 DIAGNOSIS — E875 Hyperkalemia: Secondary | ICD-10-CM | POA: Diagnosis present

## 2017-05-21 DIAGNOSIS — I10 Essential (primary) hypertension: Secondary | ICD-10-CM | POA: Diagnosis present

## 2017-05-21 DIAGNOSIS — Z955 Presence of coronary angioplasty implant and graft: Secondary | ICD-10-CM

## 2017-05-21 DIAGNOSIS — I472 Ventricular tachycardia: Secondary | ICD-10-CM | POA: Diagnosis present

## 2017-05-21 DIAGNOSIS — I5023 Acute on chronic systolic (congestive) heart failure: Secondary | ICD-10-CM | POA: Diagnosis present

## 2017-05-21 DIAGNOSIS — K219 Gastro-esophageal reflux disease without esophagitis: Secondary | ICD-10-CM | POA: Diagnosis present

## 2017-05-21 DIAGNOSIS — Z9111 Patient's noncompliance with dietary regimen: Secondary | ICD-10-CM

## 2017-05-21 LAB — CBC WITH DIFFERENTIAL/PLATELET
BASOS PCT: 0 %
Basophils Absolute: 0 10*3/uL (ref 0.0–0.1)
EOS ABS: 0.1 10*3/uL (ref 0.0–0.7)
EOS PCT: 1 %
HCT: 45.1 % (ref 36.0–46.0)
HEMOGLOBIN: 14.9 g/dL (ref 12.0–15.0)
Lymphocytes Relative: 38 %
Lymphs Abs: 1.9 10*3/uL (ref 0.7–4.0)
MCH: 26.9 pg (ref 26.0–34.0)
MCHC: 33 g/dL (ref 30.0–36.0)
MCV: 81.6 fL (ref 78.0–100.0)
MONO ABS: 0.5 10*3/uL (ref 0.1–1.0)
MONOS PCT: 11 %
NEUTROS ABS: 2.4 10*3/uL (ref 1.7–7.7)
Neutrophils Relative %: 50 %
Platelets: 199 10*3/uL (ref 150–400)
RBC: 5.53 MIL/uL — AB (ref 3.87–5.11)
RDW: 16.5 % — ABNORMAL HIGH (ref 11.5–15.5)
WBC: 4.9 10*3/uL (ref 4.0–10.5)

## 2017-05-21 LAB — COMPREHENSIVE METABOLIC PANEL
ALK PHOS: 92 U/L (ref 38–126)
ALT: 12 U/L — AB (ref 14–54)
AST: 40 U/L (ref 15–41)
Albumin: 3.6 g/dL (ref 3.5–5.0)
Anion gap: 12 (ref 5–15)
BUN: 59 mg/dL — ABNORMAL HIGH (ref 6–20)
CALCIUM: 8.8 mg/dL — AB (ref 8.9–10.3)
CO2: 22 mmol/L (ref 22–32)
CREATININE: 2.02 mg/dL — AB (ref 0.44–1.00)
Chloride: 103 mmol/L (ref 101–111)
GFR, EST AFRICAN AMERICAN: 29 mL/min — AB (ref >60)
GFR, EST NON AFRICAN AMERICAN: 25 mL/min — AB (ref >60)
Glucose, Bld: 116 mg/dL — ABNORMAL HIGH (ref 65–99)
Potassium: 6 mmol/L — ABNORMAL HIGH (ref 3.5–5.1)
Sodium: 137 mmol/L (ref 135–145)
Total Bilirubin: 2.5 mg/dL — ABNORMAL HIGH (ref 0.3–1.2)
Total Protein: 7 g/dL (ref 6.5–8.1)

## 2017-05-21 LAB — GLUCOSE, CAPILLARY
Glucose-Capillary: 94 mg/dL (ref 65–99)
Glucose-Capillary: 127 mg/dL — ABNORMAL HIGH (ref 65–99)

## 2017-05-21 LAB — TROPONIN I
TROPONIN I: 0.08 ng/mL — AB (ref <0.03)
Troponin I: 0.07 ng/mL (ref <0.03)

## 2017-05-21 LAB — BRAIN NATRIURETIC PEPTIDE: B NATRIURETIC PEPTIDE 5: 3206.8 pg/mL — AB (ref 0.0–100.0)

## 2017-05-21 MED ORDER — COLLAGENASE 250 UNIT/GM EX OINT
TOPICAL_OINTMENT | Freq: Every day | CUTANEOUS | Status: DC
  Administered 2017-05-22 – 2017-05-29 (×8): via TOPICAL
  Filled 2017-05-21 (×3): qty 30

## 2017-05-21 MED ORDER — INSULIN ASPART 100 UNIT/ML ~~LOC~~ SOLN
0.0000 [IU] | Freq: Every day | SUBCUTANEOUS | Status: DC
Start: 2017-05-21 — End: 2017-05-30

## 2017-05-21 MED ORDER — FUROSEMIDE 10 MG/ML IJ SOLN
40.0000 mg | Freq: Two times a day (BID) | INTRAMUSCULAR | Status: DC
  Administered 2017-05-22 – 2017-05-23 (×3): 40 mg via INTRAVENOUS
  Filled 2017-05-21 (×4): qty 4

## 2017-05-21 MED ORDER — ONDANSETRON HCL 4 MG/2ML IJ SOLN
4.0000 mg | Freq: Four times a day (QID) | INTRAMUSCULAR | Status: DC | PRN
  Administered 2017-05-22 – 2017-05-25 (×2): 4 mg via INTRAVENOUS
  Filled 2017-05-21 (×2): qty 2

## 2017-05-21 MED ORDER — SODIUM CHLORIDE 0.9 % IV SOLN
250.0000 mL | INTRAVENOUS | Status: DC | PRN
  Administered 2017-05-22: 1000 mL via INTRAVENOUS
  Administered 2017-05-26: 250 mL via INTRAVENOUS

## 2017-05-21 MED ORDER — ACETAMINOPHEN 325 MG PO TABS
650.0000 mg | ORAL_TABLET | ORAL | Status: DC | PRN
  Administered 2017-05-30: 650 mg via ORAL
  Filled 2017-05-21: qty 2

## 2017-05-21 MED ORDER — INSULIN ASPART 100 UNIT/ML ~~LOC~~ SOLN
0.0000 [IU] | Freq: Three times a day (TID) | SUBCUTANEOUS | Status: DC
  Administered 2017-05-22: 2 [IU] via SUBCUTANEOUS
  Administered 2017-05-23: 1 [IU] via SUBCUTANEOUS
  Administered 2017-05-26: 2 [IU] via SUBCUTANEOUS
  Administered 2017-05-26 – 2017-05-27 (×2): 1 [IU] via SUBCUTANEOUS
  Administered 2017-05-27 – 2017-05-28 (×2): 2 [IU] via SUBCUTANEOUS
  Administered 2017-05-29: 1 [IU] via SUBCUTANEOUS

## 2017-05-21 MED ORDER — SODIUM CHLORIDE 0.9% FLUSH
3.0000 mL | Freq: Two times a day (BID) | INTRAVENOUS | Status: DC
  Administered 2017-05-21 – 2017-05-29 (×15): 3 mL via INTRAVENOUS

## 2017-05-21 MED ORDER — HEPARIN SODIUM (PORCINE) 5000 UNIT/ML IJ SOLN
5000.0000 [IU] | Freq: Three times a day (TID) | INTRAMUSCULAR | Status: DC
  Administered 2017-05-23 – 2017-05-24 (×3): 5000 [IU] via SUBCUTANEOUS
  Filled 2017-05-21 (×6): qty 1

## 2017-05-21 MED ORDER — SODIUM CHLORIDE 0.9% FLUSH: 3.0000 mL | INTRAVENOUS | Status: DC | PRN

## 2017-05-21 NOTE — H&P (Signed)
Referring Physician:  [name] is an 66 y.o. female.                       Chief Complaint: Shortness of breath and leg edema  HPI: 66 year old female with known biventricular heart failure [place] heart association class II-III had gradual increase in weight from leg edema and shortness of breath.  She tried doubling up her Lasix dose with no improvement.  She has past medical history of coronary artery disease, GERD, gout, hypertension, type 2 diabetes mellitus, dietary noncompliance and prior history of smoking.  Past Medical History:  Diagnosis Date  . Biventricular heart failure (HCC)    systolic/notes 07/25/2016  . CHF (congestive heart failure) (HCC)   . Coronary artery disease   . GERD (gastroesophageal reflux disease)   . Gout   . High cholesterol   . Hypertension   . Shortness of breath dyspnea   . Stomach ulcer   . Type II diabetes mellitus (HCC)       Past Surgical History:  Procedure Laterality Date  . CARDIAC CATHETERIZATION  2014  . CORONARY ANGIOPLASTY WITH STENT PLACEMENT  2013  . TONSILLECTOMY  1950's    No family history on file. Social History:  reports that she has quit smoking. Her smoking use included cigarettes. She has a 10.00 pack-year smoking history. she has never used smokeless tobacco. She reports that she does not drink alcohol or use drugs.  Allergies:  Allergies  Allergen Reactions  . Bidil [Isosorb Dinitrate-Hydralazine] Itching and Other (See Comments)    Jittery, feels like something is crawling    Medications Prior to Admission  Medication Sig Dispense Refill  . acetaminophen (TYLENOL) 500 MG tablet Take 500 mg by mouth every 6 (six) hours as needed for moderate pain or headache.    . albuterol (PROVENTIL HFA;VENTOLIN HFA) 108 (90 Base) MCG/ACT inhaler Inhale 2 puffs into the lungs every 6 (six) hours as needed for wheezing or shortness of breath.    . allopurinol (ZYLOPRIM) 100 MG tablet Take 1 tablet (100 mg total) by mouth  daily.    Marland Kitchen. aspirin EC 81 MG tablet Take 81 mg by mouth daily.    . carvedilol (COREG) 6.25 MG tablet Take 1 tablet (6.25 mg total) by mouth 2 (two) times daily with a meal. 60 tablet 3  . collagenase (SANTYL) ointment Apply 1 application topically daily.    . cycloSPORINE (RESTASIS) 0.05 % ophthalmic emulsion Place 1 drop into both eyes daily.    . furosemide (LASIX) 80 MG tablet Take 1 tablet (80 mg total) by mouth 2 (two) times daily.    . hydroxypropyl methylcellulose / hypromellose (GONIOVISC) 2.5 % ophthalmic solution Place 1 drop into both eyes daily.    . metolazone (ZAROXOLYN) 5 MG tablet Take 1 tablet (5 mg total) by mouth every Monday, Wednesday, and Friday. One hour before AM furosemide - generic Lasix 60 tablet 3  . nitroGLYCERIN (NITROSTAT) 0.4 MG SL tablet Place 0.4 mg under the tongue every 5 (five) minutes as needed for chest pain.    . potassium chloride (K-DUR,KLOR-CON) 10 MEQ tablet Take 1 tablet (10 mEq total) by mouth daily.      No results found for this or any previous visit (from the past 48 hour(s)). No results found.  Review Of Systems Constitutional: No fever, chills, Positive weight gain. Eyes: No vision change, wears glasses. No discharge or pain. Ears: No hearing loss, No tinnitus. Respiratory: No  asthma, positive COPD, pneumonias. No shortness of breath. No hemoptysis. Cardiovascular: Positive chest pain, palpitation, leg edema. Gastrointestinal: Positive nausea, vomiting, diarrhea, constipation. No GI bleed. No hepatitis. Genitourinary: No dysuria, hematuria, kidney stone. No incontinance. Neurological: No headache, stroke, seizures.  Psychiatry: No psych facility admission for anxiety, depression, suicide. No detox. Skin: No rash. Musculoskeletal: Positive joint pain, fibromyalgia. No neck pain, back pain. Lymphadenopathy: No lymphadenopathy. Hematology: No anemia or easy bruising.   There were no vitals taken for this visit. There is no height or  weight on file to calculate BMI. General appearance: alert, cooperative, appears stated age and mild respiratory distress at rest. Head: Normocephalic, atraumatic. Eyes: Brown eyes, pink conjunctiva, corneas clear. PERRL, EOM's intact. Neck: No adenopathy, no carotid bruit, ++ JVD, supple, symmetrical, trachea midline and thyroid not enlarged. Resp: Clear to auscultation bilaterally. Cardio: Regular rate and rhythm, S1, S2 normal, II/VI systolic murmur, no click, rub or gallop GI: Soft, non-tender; bowel sounds normal; no organomegaly. Extremities: 2 + ;ower leg and presacral edema, no cyanosis or clubbing. Skin: Warm and dry.  Neurologic: Alert and oriented X 3, normal strength. Normal coordination and slow gait.  Assessment/Plan Acute on chronic biventricular systolic heart failure Hypertension Coronary artery disease Status post stent in LAD Type 2 diabetes mellitus Moderate malnutrition Moderate MR Severe TR CKD 3  Admit, give IV Lasix and use inotropic infusion if needed.  [name],Shandelle Borrelli S, MD  05/21/2017, 4:22 PM

## 2017-05-21 NOTE — Plan of Care (Signed)
Oriented to unit/room. Call bell in reach. Cardiac/Carb Mod. Diet ordered. Lasix 40 mg IV given.

## 2017-05-22 ENCOUNTER — Other Ambulatory Visit: Payer: Self-pay

## 2017-05-22 ENCOUNTER — Encounter (HOSPITAL_COMMUNITY): Payer: Self-pay

## 2017-05-22 LAB — BASIC METABOLIC PANEL
Anion gap: 12 (ref 5–15)
BUN: 59 mg/dL — AB (ref 6–20)
CHLORIDE: 105 mmol/L (ref 101–111)
CO2: 21 mmol/L — ABNORMAL LOW (ref 22–32)
Calcium: 8.9 mg/dL (ref 8.9–10.3)
Creatinine, Ser: 1.99 mg/dL — ABNORMAL HIGH (ref 0.44–1.00)
GFR calc Af Amer: 29 mL/min — ABNORMAL LOW (ref >60)
GFR, EST NON AFRICAN AMERICAN: 25 mL/min — AB (ref >60)
GLUCOSE: 86 mg/dL (ref 65–99)
Potassium: 3.7 mmol/L (ref 3.5–5.1)
Sodium: 138 mmol/L (ref 135–145)

## 2017-05-22 LAB — GLUCOSE, CAPILLARY
GLUCOSE-CAPILLARY: 75 mg/dL (ref 65–99)
GLUCOSE-CAPILLARY: 163 mg/dL — AB (ref 65–99)
GLUCOSE-CAPILLARY: 118 mg/dL — AB (ref 65–99)
Glucose-Capillary: 119 mg/dL — ABNORMAL HIGH (ref 65–99)

## 2017-05-22 LAB — TROPONIN I: TROPONIN I: 0.07 ng/mL — AB (ref <0.03)

## 2017-05-22 MED ORDER — HYPROMELLOSE (GONIOSCOPIC) 2.5 % OP SOLN: 1.0000 [drp] | Freq: Every day | OPHTHALMIC | Status: DC

## 2017-05-22 MED ORDER — CYCLOSPORINE 0.05 % OP EMUL
1.0000 [drp] | Freq: Every day | OPHTHALMIC | Status: DC
  Administered 2017-05-23 – 2017-05-30 (×7): 1 [drp] via OPHTHALMIC
  Filled 2017-05-22 (×8): qty 1

## 2017-05-22 MED ORDER — ALBUTEROL SULFATE (2.5 MG/3ML) 0.083% IN NEBU: 2.5000 mg | INHALATION_SOLUTION | Freq: Four times a day (QID) | RESPIRATORY_TRACT | Status: DC | PRN

## 2017-05-22 MED ORDER — CARVEDILOL 6.25 MG PO TABS
6.2500 mg | ORAL_TABLET | Freq: Two times a day (BID) | ORAL | Status: DC
  Administered 2017-05-22 – 2017-05-30 (×16): 6.25 mg via ORAL
  Filled 2017-05-22 (×16): qty 1

## 2017-05-22 MED ORDER — POLYVINYL ALCOHOL 1.4 % OP SOLN
1.0000 [drp] | Freq: Every day | OPHTHALMIC | Status: DC
  Administered 2017-05-23 – 2017-05-30 (×6): 1 [drp] via OPHTHALMIC
  Filled 2017-05-22 (×2): qty 15

## 2017-05-22 MED ORDER — POTASSIUM CHLORIDE CRYS ER 10 MEQ PO TBCR
10.0000 meq | EXTENDED_RELEASE_TABLET | Freq: Every day | ORAL | Status: DC
  Administered 2017-05-22 – 2017-05-30 (×9): 10 meq via ORAL
  Filled 2017-05-22 (×9): qty 1

## 2017-05-22 MED ORDER — ASPIRIN EC 81 MG PO TBEC
81.0000 mg | DELAYED_RELEASE_TABLET | Freq: Every day | ORAL | Status: DC
  Administered 2017-05-22 – 2017-05-30 (×9): 81 mg via ORAL
  Filled 2017-05-22 (×9): qty 1

## 2017-05-22 MED ORDER — ALLOPURINOL 100 MG PO TABS
100.0000 mg | ORAL_TABLET | Freq: Every day | ORAL | Status: DC
  Administered 2017-05-22 – 2017-05-29 (×8): 100 mg via ORAL
  Filled 2017-05-22 (×9): qty 1

## 2017-05-22 MED ORDER — DOBUTAMINE IN D5W 4-5 MG/ML-% IV SOLN
5.0000 ug/kg/min | INTRAVENOUS | Status: DC
Start: 2017-05-22 — End: 2017-05-29
  Administered 2017-05-22 – 2017-05-28 (×4): 5 ug/kg/min via INTRAVENOUS
  Filled 2017-05-22 (×5): qty 250

## 2017-05-22 NOTE — Plan of Care (Signed)
  Clinical Measurements: Ability to maintain clinical measurements within normal limits will improve 05/22/2017 0237 - Progressing by Luther Redourgott, Almeter Westhoff, RN   Clinical Measurements: Cardiovascular complication will be avoided 05/22/2017 0237 - Progressing by Luther Redourgott, Dimitriy Carreras, RN   Clinical Measurements: Respiratory complications will improve 05/22/2017 0237 - Progressing by Luther Redourgott, Fletcher Ostermiller, RN

## 2017-05-22 NOTE — Progress Notes (Signed)
Ref: [name], Rowe Warman, MD   Subjective:  Leg edema, JVD and shortness of breath persist. No significant urine output.  Objective:  Vital Signs in the last 24 hours: Temp:  [97.2 F (36.2 C)-97.5 F (36.4 C)] 97.5 F (36.4 C) (11/12 0433) Pulse Rate:  [81-82] 82 (11/12 0433) Cardiac Rhythm: Normal sinus rhythm;Bundle branch block (11/12 0900) Resp:  [16] 16 (11/12 0433) BP: (157-165)/(92-97) 157/97 (11/12 0433) SpO2:  [97 %-100 %] 97 % (11/12 0433) Weight:  [69.6 kg (153 lb 6.4 oz)-69.7 kg (153 lb 9.6 oz)] 69.6 kg (153 lb 6.4 oz) (11/12 0500)  Physical Exam: BP Readings from Last 1 Encounters:  05/22/17 (!) 157/97     Wt Readings from Last 1 Encounters:  05/22/17 69.6 kg (153 lb 6.4 oz)    Weight change:  Body mass index is 26.33 kg/m. HEENT: [place]/AT, Eyes-Brown, PERL, EOMI, Conjunctiva-Pink, Sclera-Non-icteric Neck: No JVD, No bruit, Trachea midline. Lungs:  Clear, Bilateral. Cardiac:  Regular rhythm, normal S1 and S2, no S3. II/VI systolic murmur. Abdomen:  Soft, non-tender. BS present. Extremities:  2 + leg and pre-sacral edema present. No cyanosis. No clubbing. CNS: AxOx3, Cranial nerves grossly intact, moves all 4 extremities.  Skin: Warm and dry.   Intake/Output from previous day: 11/11 0701 - 11/12 0700 In: 3 [I.V.:3] Out: -     Lab Results: BMET    Component Value Date/Time   NA 138 05/22/2017 0350   NA 137 05/21/2017 1645   NA 137 03/17/2017 0224   K 3.7 05/22/2017 0350   K 6.0 (H) 05/21/2017 1645   K 4.2 03/17/2017 0224   CL 105 05/22/2017 0350   CL 103 05/21/2017 1645   CL 103 03/17/2017 0224   CO2 21 (L) 05/22/2017 0350   CO2 22 05/21/2017 1645   CO2 22 03/17/2017 0224   GLUCOSE 86 05/22/2017 0350   GLUCOSE 116 (H) 05/21/2017 1645   GLUCOSE 88 03/17/2017 0224   BUN 59 (H) 05/22/2017 0350   BUN 59 (H) 05/21/2017 1645   BUN 66 (H) 03/17/2017 0224   CREATININE 1.99 (H) 05/22/2017 0350   CREATININE 2.02 (H) 05/21/2017 1645   CREATININE 1.81 (H)  03/17/2017 0224   CALCIUM 8.9 05/22/2017 0350   CALCIUM 8.8 (L) 05/21/2017 1645   CALCIUM 9.1 03/17/2017 0224   GFRNONAA 25 (L) 05/22/2017 0350   GFRNONAA 25 (L) 05/21/2017 1645   GFRNONAA 28 (L) 03/17/2017 0224   GFRAA 29 (L) 05/22/2017 0350   GFRAA 29 (L) 05/21/2017 1645   GFRAA 33 (L) 03/17/2017 0224   CBC    Component Value Date/Time   WBC 4.9 05/21/2017 1645   RBC 5.53 (H) 05/21/2017 1645   HGB 14.9 05/21/2017 1645   HCT 45.1 05/21/2017 1645   PLT 199 05/21/2017 1645   MCV 81.6 05/21/2017 1645   MCH 26.9 05/21/2017 1645   MCHC 33.0 05/21/2017 1645   RDW 16.5 (H) 05/21/2017 1645   LYMPHSABS 1.9 05/21/2017 1645   MONOABS 0.5 05/21/2017 1645   EOSABS 0.1 05/21/2017 1645   BASOSABS 0.0 05/21/2017 1645   HEPATIC Function Panel Recent Labs    03/11/17 1828 03/17/17 0224 05/21/17 1645  PROT 7.4 7.1 7.0   HEMOGLOBIN A1C No components found for: HGA1C,  MPG CARDIAC ENZYMES Lab Results  Component Value Date   CKTOTAL 59 09/07/2012   CKMB 3.2 09/07/2012   TROPONINI 0.07 (HH) 05/22/2017   TROPONINI 0.07 (HH) 05/21/2017   TROPONINI 0.08 (HH) 05/21/2017   BNP No results for input(s):  PROBNP in the last 8760 hours. TSH No results for input(s): TSH in the last 8760 hours. CHOLESTEROL No results for input(s): CHOL in the last 8760 hours.  Scheduled Meds: . allopurinol  100 mg Oral Daily  . aspirin EC  81 mg Oral Daily  . carvedilol  6.25 mg Oral BID WC  . collagenase   Topical Daily  . cycloSPORINE  1 drop Both Eyes Daily  . furosemide  40 mg Intravenous Q12H  . heparin  5,000 Units Subcutaneous Q8H  . hydroxypropyl methylcellulose / hypromellose  1 drop Both Eyes Daily  . insulin aspart  0-5 Units Subcutaneous QHS  . insulin aspart  0-9 Units Subcutaneous TID WC  . potassium chloride  10 mEq Oral Daily  . sodium chloride flush  3 mL Intravenous Q12H   Continuous Infusions: . sodium chloride    . DOBUTamine     PRN Meds:.sodium chloride, acetaminophen,  albuterol, ondansetron (ZOFRAN) IV, sodium chloride flush  Assessment/Plan: Acute on chronic biventricular systolic heart failure Hypertension Coronary artery disease Status post stenting LAD Type 2 diabetes mellitus Moderate protein calorie malnutrition Moderate MR Severe TR CKD 3  Start IV Dobutamine infusion.   LOS: 1 day    Orpah CobbAjay Katessa Attridge  MD  05/22/2017, 11:08 AM

## 2017-05-23 ENCOUNTER — Other Ambulatory Visit: Payer: Self-pay

## 2017-05-23 LAB — BASIC METABOLIC PANEL
ANION GAP: 10 (ref 5–15)
BUN: 60 mg/dL — ABNORMAL HIGH (ref 6–20)
CALCIUM: 8.6 mg/dL — AB (ref 8.9–10.3)
CHLORIDE: 106 mmol/L (ref 101–111)
CO2: 21 mmol/L — AB (ref 22–32)
Creatinine, Ser: 2.18 mg/dL — ABNORMAL HIGH (ref 0.44–1.00)
GFR calc Af Amer: 26 mL/min — ABNORMAL LOW (ref >60)
GFR calc non Af Amer: 23 mL/min — ABNORMAL LOW (ref >60)
Glucose, Bld: 80 mg/dL (ref 65–99)
Potassium: 4.2 mmol/L (ref 3.5–5.1)
Sodium: 137 mmol/L (ref 135–145)

## 2017-05-23 LAB — GLUCOSE, CAPILLARY
GLUCOSE-CAPILLARY: 115 mg/dL — AB (ref 65–99)
GLUCOSE-CAPILLARY: 133 mg/dL — AB (ref 65–99)
Glucose-Capillary: 80 mg/dL (ref 65–99)
Glucose-Capillary: 141 mg/dL — ABNORMAL HIGH (ref 65–99)

## 2017-05-23 MED ORDER — FUROSEMIDE 10 MG/ML IJ SOLN
80.0000 mg | Freq: Two times a day (BID) | INTRAMUSCULAR | Status: DC
  Administered 2017-05-23 – 2017-05-30 (×15): 80 mg via INTRAVENOUS
  Filled 2017-05-23 (×15): qty 8

## 2017-05-23 MED ORDER — MAGNESIUM SULFATE 2 GM/50ML IV SOLN
2.0000 g | Freq: Once | INTRAVENOUS | Status: AC
  Administered 2017-05-23: 2 g via INTRAVENOUS
  Filled 2017-05-23: qty 50

## 2017-05-23 MED ORDER — FUROSEMIDE 10 MG/ML IJ SOLN: 80.0000 mg | Freq: Two times a day (BID) | INTRAMUSCULAR | Status: DC

## 2017-05-23 NOTE — Progress Notes (Signed)
Ref: [name], Zaira Iacovelli, MD   Subjective:  Walking slowly. Leg edema worsening. No significant increase in urine out put. She had non-sustained VT, asymptomatic. Afebrile.  Objective:  Vital Signs in the last 24 hours: Temp:  [98.1 F (36.7 C)-98.9 F (37.2 C)] 98.2 F (36.8 C) (11/13 0516) Pulse Rate:  [64-79] 66 (11/13 0516) Cardiac Rhythm: Normal sinus rhythm (11/13 0700) Resp:  [16-19] 16 (11/13 0516) BP: (131-138)/(73-82) 138/82 (11/13 0516) SpO2:  [98 %-100 %] 99 % (11/13 0516) Weight:  [71 kg (156 lb 8.4 oz)] 71 kg (156 lb 8.4 oz) (11/13 0516)  Physical Exam: BP Readings from Last 1 Encounters:  05/23/17 138/82     Wt Readings from Last 1 Encounters:  05/23/17 71 kg (156 lb 8.4 oz)    Weight change: 1.328 kg (2 lb 14.8 oz) Body mass index is 26.87 kg/m. HEENT: [place]/AT, Eyes-Brown, PERL, EOMI, Conjunctiva-Pink, Sclera-Non-icteric Neck: No JVD, No bruit, Trachea midline. Lungs:  Clearing, Bilateral. Cardiac:  Regular rhythm, normal S1 and S2, no S3. II/VI systolic murmur. Abdomen:  Soft, non-tender. BS present. Extremities:  2 + edema present. No cyanosis. No clubbing. CNS: AxOx3, Cranial nerves grossly intact, moves all 4 extremities.  Skin: Warm and dry.   Intake/Output from previous day: 11/12 0701 - 11/13 0700 In: 964.8 [P.O.:720; I.V.:244.8] Out: 600 [Urine:600]    Lab Results: BMET    Component Value Date/Time   NA 137 05/23/2017 0341   NA 138 05/22/2017 0350   NA 137 05/21/2017 1645   K 4.2 05/23/2017 0341   K 3.7 05/22/2017 0350   K 6.0 (H) 05/21/2017 1645   CL 106 05/23/2017 0341   CL 105 05/22/2017 0350   CL 103 05/21/2017 1645   CO2 21 (L) 05/23/2017 0341   CO2 21 (L) 05/22/2017 0350   CO2 22 05/21/2017 1645   GLUCOSE 80 05/23/2017 0341   GLUCOSE 86 05/22/2017 0350   GLUCOSE 116 (H) 05/21/2017 1645   BUN 60 (H) 05/23/2017 0341   BUN 59 (H) 05/22/2017 0350   BUN 59 (H) 05/21/2017 1645   CREATININE 2.18 (H) 05/23/2017 0341   CREATININE 1.99  (H) 05/22/2017 0350   CREATININE 2.02 (H) 05/21/2017 1645   CALCIUM 8.6 (L) 05/23/2017 0341   CALCIUM 8.9 05/22/2017 0350   CALCIUM 8.8 (L) 05/21/2017 1645   GFRNONAA 23 (L) 05/23/2017 0341   GFRNONAA 25 (L) 05/22/2017 0350   GFRNONAA 25 (L) 05/21/2017 1645   GFRAA 26 (L) 05/23/2017 0341   GFRAA 29 (L) 05/22/2017 0350   GFRAA 29 (L) 05/21/2017 1645   CBC    Component Value Date/Time   WBC 4.9 05/21/2017 1645   RBC 5.53 (H) 05/21/2017 1645   HGB 14.9 05/21/2017 1645   HCT 45.1 05/21/2017 1645   PLT 199 05/21/2017 1645   MCV 81.6 05/21/2017 1645   MCH 26.9 05/21/2017 1645   MCHC 33.0 05/21/2017 1645   RDW 16.5 (H) 05/21/2017 1645   LYMPHSABS 1.9 05/21/2017 1645   MONOABS 0.5 05/21/2017 1645   EOSABS 0.1 05/21/2017 1645   BASOSABS 0.0 05/21/2017 1645   HEPATIC Function Panel Recent Labs    03/11/17 1828 03/17/17 0224 05/21/17 1645  PROT 7.4 7.1 7.0   HEMOGLOBIN A1C No components found for: HGA1C,  MPG CARDIAC ENZYMES Lab Results  Component Value Date   CKTOTAL 59 09/07/2012   CKMB 3.2 09/07/2012   TROPONINI 0.07 (HH) 05/22/2017   TROPONINI 0.07 (HH) 05/21/2017   TROPONINI 0.08 (HH) 05/21/2017   BNP No  results for input(s): PROBNP in the last 8760 hours. TSH No results for input(s): TSH in the last 8760 hours. CHOLESTEROL No results for input(s): CHOL in the last 8760 hours.  Scheduled Meds: . allopurinol  100 mg Oral Daily  . aspirin EC  81 mg Oral Daily  . carvedilol  6.25 mg Oral BID WC  . collagenase   Topical Daily  . cycloSPORINE  1 drop Both Eyes Daily  . furosemide  80 mg Intravenous Q12H  . heparin  5,000 Units Subcutaneous Q8H  . insulin aspart  0-5 Units Subcutaneous QHS  . insulin aspart  0-9 Units Subcutaneous TID WC  . polyvinyl alcohol  1 drop Both Eyes Daily  . potassium chloride  10 mEq Oral Daily  . sodium chloride flush  3 mL Intravenous Q12H   Continuous Infusions: . sodium chloride 250 mL (05/22/17 2100)  . DOBUTamine 5  mcg/kg/min (05/22/17 2100)  . magnesium sulfate 1 - 4 g bolus IVPB     PRN Meds:.sodium chloride, acetaminophen, albuterol, ondansetron (ZOFRAN) IV, sodium chloride flush  Assessment/Plan: Acute on chronic biventricular systolic heart failure Non-sustained VT Hypertension CAD S/P LAD stent Type II DM Moderate protein calorie malnutrition Moderate MR Severe TR CKD, III  Increase lasix to 80 mg. q 12 hr. IV magnesium. Use walker for ambulation.   LOS: 2 days    Orpah CobbAjay Leaman Abe  MD  05/23/2017, 9:15 AM

## 2017-05-24 LAB — GLUCOSE, CAPILLARY
GLUCOSE-CAPILLARY: 68 mg/dL (ref 65–99)
GLUCOSE-CAPILLARY: 134 mg/dL — AB (ref 65–99)
Glucose-Capillary: 114 mg/dL — ABNORMAL HIGH (ref 65–99)
Glucose-Capillary: 107 mg/dL — ABNORMAL HIGH (ref 65–99)

## 2017-05-24 LAB — BASIC METABOLIC PANEL
Anion gap: 9 (ref 5–15)
BUN: 56 mg/dL — ABNORMAL HIGH (ref 6–20)
CALCIUM: 8.4 mg/dL — AB (ref 8.9–10.3)
CO2: 24 mmol/L (ref 22–32)
CREATININE: 2.15 mg/dL — AB (ref 0.44–1.00)
Chloride: 104 mmol/L (ref 101–111)
GFR calc Af Amer: 27 mL/min — ABNORMAL LOW (ref >60)
GFR calc non Af Amer: 23 mL/min — ABNORMAL LOW (ref >60)
GLUCOSE: 150 mg/dL — AB (ref 65–99)
Potassium: 4 mmol/L (ref 3.5–5.1)
Sodium: 137 mmol/L (ref 135–145)

## 2017-05-24 LAB — MAGNESIUM: Magnesium: 2.2 mg/dL (ref 1.7–2.4)

## 2017-05-24 NOTE — Progress Notes (Signed)
Pt refused heparin SQ for VTE prophylaxis. States "makes bleeding on stomach" Education provided but refused teaching. Sevastian Witczak  Ruthe MannanMawule Marquasia Schmieder, RN, BSN

## 2017-05-24 NOTE — Progress Notes (Signed)
Ref: [name], Savahna Casados, MD   Subjective:  Improving diuresis with increased lasix dose, leg edema persist. VS stable.   Objective:  Vital Signs in the last 24 hours: Temp:  [97.5 F (36.4 C)-98.1 F (36.7 C)] 97.8 F (36.6 C) (11/14 0500) Pulse Rate:  [61-64] 62 (11/14 0500) Cardiac Rhythm: Sinus bradycardia (11/14 0731) BP: (126-139)/(70-88) 139/88 (11/14 0500) SpO2:  [96 %-100 %] 98 % (11/14 0500) Weight:  [70.9 kg (156 lb 4.8 oz)] 70.9 kg (156 lb 4.8 oz) (11/14 0500)  Physical Exam: BP Readings from Last 1 Encounters:  05/24/17 139/88     Wt Readings from Last 1 Encounters:  05/24/17 70.9 kg (156 lb 4.8 oz)    Weight change: -0.103 kg (-3.6 oz) Body mass index is 26.83 kg/m. HEENT: [place]/AT, Eyes-Brown, PERL, EOMI, Conjunctiva-Pink, Sclera-Non-icteric Neck: No JVD, No bruit, Trachea midline. Lungs:  Clearing, Bilateral. Cardiac:  Regular rhythm, normal S1 and S2, no S3. II/VI systolic murmur. Abdomen:  Soft, non-tender. BS present. Extremities:  2 + edema present. No cyanosis. No clubbing. CNS: AxOx3, Cranial nerves grossly intact, moves all 4 extremities.  Skin: Warm and dry.   Intake/Output from previous day: 11/13 0701 - 11/14 0700 In: 1129.6 [P.O.:780; I.V.:349.6] Out: 2350 [Urine:2350]    Lab Results: BMET    Component Value Date/Time   NA 137 05/24/2017 0423   NA 137 05/23/2017 0341   NA 138 05/22/2017 0350   K 4.0 05/24/2017 0423   K 4.2 05/23/2017 0341   K 3.7 05/22/2017 0350   CL 104 05/24/2017 0423   CL 106 05/23/2017 0341   CL 105 05/22/2017 0350   CO2 24 05/24/2017 0423   CO2 21 (L) 05/23/2017 0341   CO2 21 (L) 05/22/2017 0350   GLUCOSE 150 (H) 05/24/2017 0423   GLUCOSE 80 05/23/2017 0341   GLUCOSE 86 05/22/2017 0350   BUN 56 (H) 05/24/2017 0423   BUN 60 (H) 05/23/2017 0341   BUN 59 (H) 05/22/2017 0350   CREATININE 2.15 (H) 05/24/2017 0423   CREATININE 2.18 (H) 05/23/2017 0341   CREATININE 1.99 (H) 05/22/2017 0350   CALCIUM 8.4 (L)  05/24/2017 0423   CALCIUM 8.6 (L) 05/23/2017 0341   CALCIUM 8.9 05/22/2017 0350   GFRNONAA 23 (L) 05/24/2017 0423   GFRNONAA 23 (L) 05/23/2017 0341   GFRNONAA 25 (L) 05/22/2017 0350   GFRAA 27 (L) 05/24/2017 0423   GFRAA 26 (L) 05/23/2017 0341   GFRAA 29 (L) 05/22/2017 0350   CBC    Component Value Date/Time   WBC 4.9 05/21/2017 1645   RBC 5.53 (H) 05/21/2017 1645   HGB 14.9 05/21/2017 1645   HCT 45.1 05/21/2017 1645   PLT 199 05/21/2017 1645   MCV 81.6 05/21/2017 1645   MCH 26.9 05/21/2017 1645   MCHC 33.0 05/21/2017 1645   RDW 16.5 (H) 05/21/2017 1645   LYMPHSABS 1.9 05/21/2017 1645   MONOABS 0.5 05/21/2017 1645   EOSABS 0.1 05/21/2017 1645   BASOSABS 0.0 05/21/2017 1645   HEPATIC Function Panel Recent Labs    03/11/17 1828 03/17/17 0224 05/21/17 1645  PROT 7.4 7.1 7.0   HEMOGLOBIN A1C No components found for: HGA1C,  MPG CARDIAC ENZYMES Lab Results  Component Value Date   CKTOTAL 59 09/07/2012   CKMB 3.2 09/07/2012   TROPONINI 0.07 (HH) 05/22/2017   TROPONINI 0.07 (HH) 05/21/2017   TROPONINI 0.08 (HH) 05/21/2017   BNP No results for input(s): PROBNP in the last 8760 hours. TSH No results for input(s): TSH in  the last 8760 hours. CHOLESTEROL No results for input(s): CHOL in the last 8760 hours.  Scheduled Meds: . allopurinol  100 mg Oral Daily  . aspirin EC  81 mg Oral Daily  . carvedilol  6.25 mg Oral BID WC  . collagenase   Topical Daily  . cycloSPORINE  1 drop Both Eyes Daily  . furosemide  80 mg Intravenous Q12H  . heparin  5,000 Units Subcutaneous Q8H  . insulin aspart  0-5 Units Subcutaneous QHS  . insulin aspart  0-9 Units Subcutaneous TID WC  . polyvinyl alcohol  1 drop Both Eyes Daily  . potassium chloride  10 mEq Oral Daily  . sodium chloride flush  3 mL Intravenous Q12H   Continuous Infusions: . sodium chloride 250 mL (05/23/17 2100)  . 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 Non-sustained VT Hypertension CAD S/P LAD stent Type II DM Moderate protein calorie malnutrition Moderate MR Severe TR CKD, III  Continue diuresis.   LOS: 3 days    Orpah CobbAjay Basha Krygier  MD  05/24/2017, 9:52 AM

## 2017-05-25 ENCOUNTER — Other Ambulatory Visit: Payer: Self-pay

## 2017-05-25 LAB — GLUCOSE, CAPILLARY
GLUCOSE-CAPILLARY: 135 mg/dL — AB (ref 65–99)
Glucose-Capillary: 81 mg/dL (ref 65–99)
Glucose-Capillary: 116 mg/dL — ABNORMAL HIGH (ref 65–99)
Glucose-Capillary: 116 mg/dL — ABNORMAL HIGH (ref 65–99)

## 2017-05-25 MED ORDER — MAGNESIUM OXIDE 400 (241.3 MG) MG PO TABS
400.0000 mg | ORAL_TABLET | Freq: Every day | ORAL | Status: DC
  Administered 2017-05-25 – 2017-05-30 (×6): 400 mg via ORAL
  Filled 2017-05-25 (×6): qty 1

## 2017-05-25 MED ORDER — METOLAZONE 5 MG PO TABS: 5.0000 mg | ORAL_TABLET | Freq: Every day | ORAL | Status: DC

## 2017-05-25 MED ORDER — METOLAZONE 5 MG PO TABS
5.0000 mg | ORAL_TABLET | Freq: Every day | ORAL | Status: DC
  Administered 2017-05-25 – 2017-05-29 (×5): 5 mg via ORAL
  Filled 2017-05-25 (×6): qty 1

## 2017-05-25 NOTE — Care Management Important Message (Signed)
Important Message  Patient Details  Name: [name] MRN: [id number] Date of Birth: 1950/08/12   Medicare Important Message Given:  Yes    Myria Steenbergen Stefan ChurchBratton 05/25/2017, 10:27 AM

## 2017-05-25 NOTE — Progress Notes (Signed)
. RefOrpah Cobb: Tanya Dung, MD   Subjective:  Slow diuresis. Patient advised to decrease oral intake as much as possible.  Objective:  Vital Signs in the last 24 hours: Temp:  [97.5 F (36.4 C)-97.7 F (36.5 C)] 97.5 F (36.4 C) (11/15 0616) Pulse Rate:  [64-66] 66 (11/15 0616) Cardiac Rhythm: Sinus bradycardia (11/15 0810) Resp:  [18-22] 18 (11/15 0616) BP: (135-144)/(80-88) 138/80 (11/15 0823) SpO2:  [99 %] 99 % (11/15 0616) Weight:  [70.9 kg (156 lb 6.4 oz)] 70.9 kg (156 lb 6.4 oz) (11/15 0327)  Physical Exam: BP Readings from Last 1 Encounters:  05/25/17 138/80     Wt Readings from Last 1 Encounters:  05/25/17 70.9 kg (156 lb 6.4 oz)    Weight change: 0.045 kg (1.6 oz) Body mass index is 26.85 kg/m. HEENT: [name]/AT, Eyes-Brown, PERL, EOMI, Conjunctiva-Pink, Sclera-Non-icteric Neck: 2 + JVD, No bruit, Trachea midline. Lungs:  Clearing, Bilateral. Cardiac:  Regular rhythm, normal S1 and S2, no S3. II/VI systolic murmur. Abdomen:  Soft, non-tender. BS present. Extremities:  2 + lower leg and presacral edema present. No cyanosis. No clubbing. CNS: AxOx3, Cranial nerves grossly intact, moves all 4 extremities.  Skin: Warm and dry.   Intake/Output from previous day: 11/14 0701 - 11/15 0700 In: 902 [P.O.:720; I.V.:182] Out: 1800 [Urine:1800]    Lab Results: BMET    Component Value Date/Time   NA 137 05/24/2017 0423   NA 137 05/23/2017 0341   NA 138 05/22/2017 0350   K 4.0 05/24/2017 0423   K 4.2 05/23/2017 0341   K 3.7 05/22/2017 0350   CL 104 05/24/2017 0423   CL 106 05/23/2017 0341   CL 105 05/22/2017 0350   CO2 24 05/24/2017 0423   CO2 21 (L) 05/23/2017 0341   CO2 21 (L) 05/22/2017 0350   GLUCOSE 150 (H) 05/24/2017 0423   GLUCOSE 80 05/23/2017 0341   GLUCOSE 86 05/22/2017 0350   BUN 56 (H) 05/24/2017 0423   BUN 60 (H) 05/23/2017 0341   BUN 59 (H) 05/22/2017 0350   CREATININE 2.15 (H) 05/24/2017 0423   CREATININE 2.18 (H) 05/23/2017 0341   CREATININE 1.99  (H) 05/22/2017 0350   CALCIUM 8.4 (L) 05/24/2017 0423   CALCIUM 8.6 (L) 05/23/2017 0341   CALCIUM 8.9 05/22/2017 0350   GFRNONAA 23 (L) 05/24/2017 0423   GFRNONAA 23 (L) 05/23/2017 0341   GFRNONAA 25 (L) 05/22/2017 0350   GFRAA 27 (L) 05/24/2017 0423   GFRAA 26 (L) 05/23/2017 0341   GFRAA 29 (L) 05/22/2017 0350   CBC    Component Value Date/Time   WBC 4.9 05/21/2017 1645   RBC 5.53 (H) 05/21/2017 1645   HGB 14.9 05/21/2017 1645   HCT 45.1 05/21/2017 1645   PLT 199 05/21/2017 1645   MCV 81.6 05/21/2017 1645   MCH 26.9 05/21/2017 1645   MCHC 33.0 05/21/2017 1645   RDW 16.5 (H) 05/21/2017 1645   LYMPHSABS 1.9 05/21/2017 1645   MONOABS 0.5 05/21/2017 1645   EOSABS 0.1 05/21/2017 1645   BASOSABS 0.0 05/21/2017 1645   HEPATIC Function Panel Recent Labs    03/11/17 1828 03/17/17 0224 05/21/17 1645  PROT 7.4 7.1 7.0   HEMOGLOBIN A1C No components found for: HGA1C,  MPG CARDIAC ENZYMES Lab Results  Component Value Date   CKTOTAL 59 09/07/2012   CKMB 3.2 09/07/2012   TROPONINI 0.07 (HH) 05/22/2017   TROPONINI 0.07 (HH) 05/21/2017   TROPONINI 0.08 (HH) 05/21/2017   BNP No results for input(s): PROBNP in  the last 8760 hours. TSH No results for input(s): TSH in the last 8760 hours. CHOLESTEROL No results for input(s): CHOL in the last 8760 hours.  Scheduled Meds: . allopurinol  100 mg Oral Daily  . aspirin EC  81 mg Oral Daily  . carvedilol  6.25 mg Oral BID WC  . collagenase   Topical Daily  . cycloSPORINE  1 drop Both Eyes Daily  . furosemide  80 mg Intravenous Q12H  . heparin  5,000 Units Subcutaneous Q8H  . insulin aspart  0-5 Units Subcutaneous QHS  . insulin aspart  0-9 Units Subcutaneous TID WC  . magnesium oxide  400 mg Oral Daily  . polyvinyl alcohol  1 drop Both Eyes Daily  . potassium chloride  10 mEq Oral Daily  . sodium chloride flush  3 mL Intravenous Q12H   Continuous Infusions: . sodium chloride 250 mL (05/25/17 1100)  . 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 Add Metolazone   LOS: 4 days    [name]  MD  05/25/2017, 5:45 PM

## 2017-05-25 NOTE — Progress Notes (Signed)
Pt had 10 runs of v-tach.  Paged Dr. Algie CofferKadakia.  Received order for Mg 400 mg po daily.  Hinton DyerYoko Roshni Burbano, RN

## 2017-05-26 LAB — BASIC METABOLIC PANEL
Anion gap: 11 (ref 5–15)
BUN: 53 mg/dL — ABNORMAL HIGH (ref 6–20)
CHLORIDE: 100 mmol/L — AB (ref 101–111)
CO2: 25 mmol/L (ref 22–32)
Calcium: 8.8 mg/dL — ABNORMAL LOW (ref 8.9–10.3)
Creatinine, Ser: 1.94 mg/dL — ABNORMAL HIGH (ref 0.44–1.00)
GFR calc non Af Amer: 26 mL/min — ABNORMAL LOW (ref >60)
GFR, EST AFRICAN AMERICAN: 30 mL/min — AB (ref >60)
Glucose, Bld: 74 mg/dL (ref 65–99)
Potassium: 3.9 mmol/L (ref 3.5–5.1)
SODIUM: 136 mmol/L (ref 135–145)

## 2017-05-26 LAB — GLUCOSE, CAPILLARY
GLUCOSE-CAPILLARY: 160 mg/dL — AB (ref 65–99)
GLUCOSE-CAPILLARY: 138 mg/dL — AB (ref 65–99)
GLUCOSE-CAPILLARY: 113 mg/dL — AB (ref 65–99)
Glucose-Capillary: 70 mg/dL (ref 65–99)

## 2017-05-26 LAB — MAGNESIUM: MAGNESIUM: 2.2 mg/dL (ref 1.7–2.4)

## 2017-05-26 NOTE — Progress Notes (Signed)
Ref: [name], Kenae Lindquist, MD   Subjective:  Improved weight loss with further reduction in fluid intake. Leg edema and exertional shortness of breath persist.  Objective:  Vital Signs in the last 24 hours: Temp:  [97.8 F (36.6 C)-97.9 F (36.6 C)] 97.8 F (36.6 C) (11/16 0403) Pulse Rate:  [58-61] 61 (11/16 0403) Cardiac Rhythm: Sinus bradycardia;Heart block (11/16 0701) Resp:  [18] 18 (11/16 0403) BP: (141-159)/(69-83) 159/83 (11/16 0840) SpO2:  [99 %-100 %] 100 % (11/16 0403) Weight:  [69.4 kg (152 lb 16 oz)] 69.4 kg (152 lb 16 oz) (11/16 0403)  Physical Exam: BP Readings from Last 1 Encounters:  05/26/17 (!) 159/83     Wt Readings from Last 1 Encounters:  05/26/17 69.4 kg (152 lb 16 oz)    Weight change: -1.543 kg (-6.4 oz) Body mass index is 26.26 kg/m. HEENT: [place]/AT, Eyes-Brown, PERL, EOMI, Conjunctiva-Pink, Sclera-Non-icteric Neck: 1+ JVD, No bruit, Trachea midline. Lungs:  Clearing, Bilateral. Cardiac:  Regular rhythm, normal S1 and S2, no S3. II/VI systolic murmur. Abdomen:  Soft, non-tender. BS present. Extremities:  2 + edema present. No cyanosis. No clubbing. CNS: AxOx3, Cranial nerves grossly intact, moves all 4 extremities.  Skin: Warm and dry.   Intake/Output from previous day: 11/15 0701 - 11/16 0700 In: 914 [P.O.:660; I.V.:254] Out: 1850 [Urine:1850]    Lab Results: BMET    Component Value Date/Time   NA 136 05/26/2017 0600   NA 137 05/24/2017 0423   NA 137 05/23/2017 0341   K 3.9 05/26/2017 0600   K 4.0 05/24/2017 0423   K 4.2 05/23/2017 0341   CL 100 (L) 05/26/2017 0600   CL 104 05/24/2017 0423   CL 106 05/23/2017 0341   CO2 25 05/26/2017 0600   CO2 24 05/24/2017 0423   CO2 21 (L) 05/23/2017 0341   GLUCOSE 74 05/26/2017 0600   GLUCOSE 150 (H) 05/24/2017 0423   GLUCOSE 80 05/23/2017 0341   BUN 53 (H) 05/26/2017 0600   BUN 56 (H) 05/24/2017 0423   BUN 60 (H) 05/23/2017 0341   CREATININE 1.94 (H) 05/26/2017 0600   CREATININE 2.15 (H)  05/24/2017 0423   CREATININE 2.18 (H) 05/23/2017 0341   CALCIUM 8.8 (L) 05/26/2017 0600   CALCIUM 8.4 (L) 05/24/2017 0423   CALCIUM 8.6 (L) 05/23/2017 0341   GFRNONAA 26 (L) 05/26/2017 0600   GFRNONAA 23 (L) 05/24/2017 0423   GFRNONAA 23 (L) 05/23/2017 0341   GFRAA 30 (L) 05/26/2017 0600   GFRAA 27 (L) 05/24/2017 0423   GFRAA 26 (L) 05/23/2017 0341   CBC    Component Value Date/Time   WBC 4.9 05/21/2017 1645   RBC 5.53 (H) 05/21/2017 1645   HGB 14.9 05/21/2017 1645   HCT 45.1 05/21/2017 1645   PLT 199 05/21/2017 1645   MCV 81.6 05/21/2017 1645   MCH 26.9 05/21/2017 1645   MCHC 33.0 05/21/2017 1645   RDW 16.5 (H) 05/21/2017 1645   LYMPHSABS 1.9 05/21/2017 1645   MONOABS 0.5 05/21/2017 1645   EOSABS 0.1 05/21/2017 1645   BASOSABS 0.0 05/21/2017 1645   HEPATIC Function Panel Recent Labs    03/11/17 1828 03/17/17 0224 05/21/17 1645  PROT 7.4 7.1 7.0   HEMOGLOBIN A1C No components found for: HGA1C,  MPG CARDIAC ENZYMES Lab Results  Component Value Date   CKTOTAL 59 09/07/2012   CKMB 3.2 09/07/2012   TROPONINI 0.07 (HH) 05/22/2017   TROPONINI 0.07 (HH) 05/21/2017   TROPONINI 0.08 (HH) 05/21/2017   BNP No results for  input(s): PROBNP in the last 8760 hours. TSH No results for input(s): TSH in the last 8760 hours. CHOLESTEROL No results for input(s): CHOL in the last 8760 hours.  Scheduled Meds: . allopurinol  100 mg Oral Daily  . aspirin EC  81 mg Oral Daily  . carvedilol  6.25 mg Oral BID WC  . collagenase   Topical Daily  . cycloSPORINE  1 drop Both Eyes Daily  . furosemide  80 mg Intravenous Q12H  . heparin  5,000 Units Subcutaneous Q8H  . insulin aspart  0-5 Units Subcutaneous QHS  . insulin aspart  0-9 Units Subcutaneous TID WC  . magnesium oxide  400 mg Oral Daily  . metolazone  5 mg Oral Daily  . polyvinyl alcohol  1 drop Both Eyes Daily  . potassium chloride  10 mEq Oral Daily  . sodium chloride flush  3 mL Intravenous Q12H   Continuous  Infusions: . sodium chloride 250 mL (05/25/17 1100)  . 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     LOS: 5 days    Orpah CobbAjay Fahad Cisse  MD  05/26/2017, 10:02 AM

## 2017-05-27 LAB — GLUCOSE, CAPILLARY
GLUCOSE-CAPILLARY: 89 mg/dL (ref 65–99)
GLUCOSE-CAPILLARY: 152 mg/dL — AB (ref 65–99)
GLUCOSE-CAPILLARY: 125 mg/dL — AB (ref 65–99)
Glucose-Capillary: 162 mg/dL — ABNORMAL HIGH (ref 65–99)

## 2017-05-27 NOTE — Progress Notes (Signed)
Ref: [name], Kedarius Aloisi, MD   Subjective:  Leg edema and exertional shortness of breath persist. Good diuresis yesterday.  Objective:  Vital Signs in the last 24 hours: Temp:  [97.5 F (36.4 C)-98.2 F (36.8 C)] 97.5 F (36.4 C) (11/17 0345) Pulse Rate:  [56-59] 59 (11/17 0345) Cardiac Rhythm: Sinus bradycardia (11/17 0701) BP: (137-158)/(70-71) 158/71 (11/17 0852) SpO2:  [97 %-100 %] 100 % (11/17 0345) Weight:  [65.4 kg (144 lb 1.6 oz)] 65.4 kg (144 lb 1.6 oz) (11/17 0345)  Physical Exam: BP Readings from Last 1 Encounters:  05/27/17 (!) 158/71     Wt Readings from Last 1 Encounters:  05/27/17 65.4 kg (144 lb 1.6 oz)    Weight change: -4.037 kg (-14.4 oz) Body mass index is 24.73 kg/m. HEENT: [place]/AT, Eyes-Brown, PERL, EOMI, Conjunctiva-Pink, Sclera-Non-icteric Neck: + JVD at 30 degree angle, No bruit, Trachea midline. Lungs:  Clear, Bilateral. Cardiac:  Regular rhythm, normal S1 and S2, no S3. II/VI systolic murmur. Abdomen:  Soft, non-tender. BS present. Extremities:  2 + edema up to knees present. No cyanosis. No clubbing. CNS: AxOx3, Cranial nerves grossly intact, moves all 4 extremities.  Skin: Warm and dry.   Intake/Output from previous day: 11/16 0701 - 11/17 0700 In: 1084.8 [P.O.:720; I.V.:364.8] Out: 4325 [Urine:4325]    Lab Results: BMET    Component Value Date/Time   NA 136 05/26/2017 0600   NA 137 05/24/2017 0423   NA 137 05/23/2017 0341   K 3.9 05/26/2017 0600   K 4.0 05/24/2017 0423   K 4.2 05/23/2017 0341   CL 100 (L) 05/26/2017 0600   CL 104 05/24/2017 0423   CL 106 05/23/2017 0341   CO2 25 05/26/2017 0600   CO2 24 05/24/2017 0423   CO2 21 (L) 05/23/2017 0341   GLUCOSE 74 05/26/2017 0600   GLUCOSE 150 (H) 05/24/2017 0423   GLUCOSE 80 05/23/2017 0341   BUN 53 (H) 05/26/2017 0600   BUN 56 (H) 05/24/2017 0423   BUN 60 (H) 05/23/2017 0341   CREATININE 1.94 (H) 05/26/2017 0600   CREATININE 2.15 (H) 05/24/2017 0423   CREATININE 2.18 (H)  05/23/2017 0341   CALCIUM 8.8 (L) 05/26/2017 0600   CALCIUM 8.4 (L) 05/24/2017 0423   CALCIUM 8.6 (L) 05/23/2017 0341   GFRNONAA 26 (L) 05/26/2017 0600   GFRNONAA 23 (L) 05/24/2017 0423   GFRNONAA 23 (L) 05/23/2017 0341   GFRAA 30 (L) 05/26/2017 0600   GFRAA 27 (L) 05/24/2017 0423   GFRAA 26 (L) 05/23/2017 0341   CBC    Component Value Date/Time   WBC 4.9 05/21/2017 1645   RBC 5.53 (H) 05/21/2017 1645   HGB 14.9 05/21/2017 1645   HCT 45.1 05/21/2017 1645   PLT 199 05/21/2017 1645   MCV 81.6 05/21/2017 1645   MCH 26.9 05/21/2017 1645   MCHC 33.0 05/21/2017 1645   RDW 16.5 (H) 05/21/2017 1645   LYMPHSABS 1.9 05/21/2017 1645   MONOABS 0.5 05/21/2017 1645   EOSABS 0.1 05/21/2017 1645   BASOSABS 0.0 05/21/2017 1645   HEPATIC Function Panel Recent Labs    03/11/17 1828 03/17/17 0224 05/21/17 1645  PROT 7.4 7.1 7.0   HEMOGLOBIN A1C No components found for: HGA1C,  MPG CARDIAC ENZYMES Lab Results  Component Value Date   CKTOTAL 59 09/07/2012   CKMB 3.2 09/07/2012   TROPONINI 0.07 (HH) 05/22/2017   TROPONINI 0.07 (HH) 05/21/2017   TROPONINI 0.08 (HH) 05/21/2017   BNP No results for input(s): PROBNP in the last 8760  hours. TSH No results for input(s): TSH in the last 8760 hours. CHOLESTEROL No results for input(s): CHOL in the last 8760 hours.  Scheduled Meds: . allopurinol  100 mg Oral Daily  . aspirin EC  81 mg Oral Daily  . carvedilol  6.25 mg Oral BID WC  . collagenase   Topical Daily  . cycloSPORINE  1 drop Both Eyes Daily  . furosemide  80 mg Intravenous Q12H  . heparin  5,000 Units Subcutaneous Q8H  . insulin aspart  0-5 Units Subcutaneous QHS  . insulin aspart  0-9 Units Subcutaneous TID WC  . magnesium oxide  400 mg Oral Daily  . metolazone  5 mg Oral Daily  . polyvinyl alcohol  1 drop Both Eyes Daily  . potassium chloride  10 mEq Oral Daily  . sodium chloride flush  3 mL Intravenous Q12H   Continuous Infusions: . sodium chloride 250 mL (05/26/17  1741)  . 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 Increase activity as tolerated.   LOS: 6 days    Orpah CobbAjay Arcelia Pals  MD  05/27/2017, 10:30 AM

## 2017-05-28 LAB — COMPREHENSIVE METABOLIC PANEL
ALK PHOS: 92 U/L (ref 38–126)
ALT: 9 U/L — AB (ref 14–54)
AST: 20 U/L (ref 15–41)
Albumin: 3.1 g/dL — ABNORMAL LOW (ref 3.5–5.0)
Anion gap: 10 (ref 5–15)
BUN: 52 mg/dL — AB (ref 6–20)
CALCIUM: 9 mg/dL (ref 8.9–10.3)
CO2: 32 mmol/L (ref 22–32)
CREATININE: 1.74 mg/dL — AB (ref 0.44–1.00)
Chloride: 95 mmol/L — ABNORMAL LOW (ref 101–111)
GFR calc non Af Amer: 30 mL/min — ABNORMAL LOW (ref >60)
GFR, EST AFRICAN AMERICAN: 34 mL/min — AB (ref >60)
GLUCOSE: 110 mg/dL — AB (ref 65–99)
Potassium: 3.8 mmol/L (ref 3.5–5.1)
SODIUM: 137 mmol/L (ref 135–145)
Total Bilirubin: 1.9 mg/dL — ABNORMAL HIGH (ref 0.3–1.2)
Total Protein: 6.7 g/dL (ref 6.5–8.1)

## 2017-05-28 LAB — GLUCOSE, CAPILLARY
GLUCOSE-CAPILLARY: 108 mg/dL — AB (ref 65–99)
GLUCOSE-CAPILLARY: 151 mg/dL — AB (ref 65–99)
GLUCOSE-CAPILLARY: 115 mg/dL — AB (ref 65–99)
Glucose-Capillary: 106 mg/dL — ABNORMAL HIGH (ref 65–99)

## 2017-05-28 MED ORDER — PHENTOLAMINE MESYLATE 5 MG IJ SOLR
5.0000 mg | Freq: Once | INTRAMUSCULAR | Status: AC
  Administered 2017-05-28: 5 mg via SUBCUTANEOUS
  Filled 2017-05-28: qty 5

## 2017-05-28 NOTE — Progress Notes (Signed)
Ref: [name], Earl Losee, MD   Subjective:  Had small infiltration of dobutamine in right forearm last night. Total 1 ml of 0.5 mg/ml phentolamine (mixing 5 mg in 10 ml of saline) injected subcutaneously at infiltration site measuring 1" by 1.5 " irregular area last night by me with instant significant relief per patient. Small-1/2'' area of soreness today near entrance of prior IV site in right forearm.  Good diuresis again.   Objective:  Vital Signs in the last 24 hours: Temp:  [97.8 F (36.6 C)-98.2 F (36.8 C)] 97.8 F (36.6 C) (11/18 0622) Pulse Rate:  [51-60] 60 (11/18 0622) Cardiac Rhythm: Sinus bradycardia (11/18 0701) Resp:  [18] 18 (11/18 0622) BP: (132-160)/(64-83) 144/72 (11/18 0622) SpO2:  [100 %] 100 % (11/17 2135) Weight:  [62.6 kg (138 lb)] 62.6 kg (138 lb) (11/18 0600)  Physical Exam: BP Readings from Last 1 Encounters:  05/28/17 (!) 144/72     Wt Readings from Last 1 Encounters:  05/28/17 62.6 kg (138 lb)    Weight change: -2.767 kg (-1.6 oz) Body mass index is 23.69 kg/m. HEENT: [place]/AT, Eyes-Brown, PERL, EOMI, Conjunctiva-Pink, Sclera-Non-icteric Neck: + JVD at 30 degree angle, No bruit, Trachea midline. Lungs:  Clear, Bilateral. Cardiac:  Regular rhythm, normal S1 and S2, no S3. II/VI systolic murmur. Abdomen:  Soft, non-tender. BS present. Extremities:  2 + lower leg edema present. Smaller pre-sacral edema. No cyanosis. No clubbing. CNS: AxOx3, Cranial nerves grossly intact, moves all 4 extremities.  Skin: Warm and dry.   Intake/Output from previous day: 11/17 0701 - 11/18 0700 In: 777.2 [P.O.:720; I.V.:57.2] Out: 4500 [Urine:4500]    Lab Results: BMET    Component Value Date/Time   NA 137 05/28/2017 0518   NA 136 05/26/2017 0600   NA 137 05/24/2017 0423   K 3.8 05/28/2017 0518   K 3.9 05/26/2017 0600   K 4.0 05/24/2017 0423   CL 95 (L) 05/28/2017 0518   CL 100 (L) 05/26/2017 0600   CL 104 05/24/2017 0423   CO2 32 05/28/2017 0518   CO2 25  05/26/2017 0600   CO2 24 05/24/2017 0423   GLUCOSE 110 (H) 05/28/2017 0518   GLUCOSE 74 05/26/2017 0600   GLUCOSE 150 (H) 05/24/2017 0423   BUN 52 (H) 05/28/2017 0518   BUN 53 (H) 05/26/2017 0600   BUN 56 (H) 05/24/2017 0423   CREATININE 1.74 (H) 05/28/2017 0518   CREATININE 1.94 (H) 05/26/2017 0600   CREATININE 2.15 (H) 05/24/2017 0423   CALCIUM 9.0 05/28/2017 0518   CALCIUM 8.8 (L) 05/26/2017 0600   CALCIUM 8.4 (L) 05/24/2017 0423   GFRNONAA 30 (L) 05/28/2017 0518   GFRNONAA 26 (L) 05/26/2017 0600   GFRNONAA 23 (L) 05/24/2017 0423   GFRAA 34 (L) 05/28/2017 0518   GFRAA 30 (L) 05/26/2017 0600   GFRAA 27 (L) 05/24/2017 0423   CBC    Component Value Date/Time   WBC 4.9 05/21/2017 1645   RBC 5.53 (H) 05/21/2017 1645   HGB 14.9 05/21/2017 1645   HCT 45.1 05/21/2017 1645   PLT 199 05/21/2017 1645   MCV 81.6 05/21/2017 1645   MCH 26.9 05/21/2017 1645   MCHC 33.0 05/21/2017 1645   RDW 16.5 (H) 05/21/2017 1645   LYMPHSABS 1.9 05/21/2017 1645   MONOABS 0.5 05/21/2017 1645   EOSABS 0.1 05/21/2017 1645   BASOSABS 0.0 05/21/2017 1645   HEPATIC Function Panel Recent Labs    03/17/17 0224 05/21/17 1645 05/28/17 0518  PROT 7.1 7.0 6.7   HEMOGLOBIN  A1C No components found for: HGA1C,  MPG CARDIAC ENZYMES Lab Results  Component Value Date   CKTOTAL 59 09/07/2012   CKMB 3.2 09/07/2012   TROPONINI 0.07 (HH) 05/22/2017   TROPONINI 0.07 (HH) 05/21/2017   TROPONINI 0.08 (HH) 05/21/2017   BNP No results for input(s): PROBNP in the last 8760 hours. TSH No results for input(s): TSH in the last 8760 hours. CHOLESTEROL No results for input(s): CHOL in the last 8760 hours.  Scheduled Meds: . allopurinol  100 mg Oral Daily  . aspirin EC  81 mg Oral Daily  . carvedilol  6.25 mg Oral BID WC  . collagenase   Topical Daily  . cycloSPORINE  1 drop Both Eyes Daily  . furosemide  80 mg Intravenous Q12H  . heparin  5,000 Units Subcutaneous Q8H  . insulin aspart  0-5 Units  Subcutaneous QHS  . insulin aspart  0-9 Units Subcutaneous TID WC  . magnesium oxide  400 mg Oral Daily  . metolazone  5 mg Oral Daily  . polyvinyl alcohol  1 drop Both Eyes Daily  . potassium chloride  10 mEq Oral Daily  . sodium chloride flush  3 mL Intravenous Q12H   Continuous Infusions: . sodium chloride 250 mL (05/26/17 1741)  . DOBUTamine 5 mcg/kg/min (05/27/17 1900)   PRN Meds:.sodium chloride, acetaminophen, albuterol, ondansetron (ZOFRAN) IV, sodium chloride flush  Assessment/Plan: Acute on chronic biventricular systolic failure Nonsustained VT Hypertension CAD Status post LAD stent Type 2 diabetes mellitus Moderate protein calorie malnutrition Moderate mitral valve regurgitation Severe tricuspid valvular regurgitation CKD 2  Continue medical therapy and increase activity.   LOS: 7 days    Orpah CobbAjay Bilaal Leib  MD  05/28/2017, 10:45 AM

## 2017-05-28 NOTE — Progress Notes (Signed)
IV site looks a little more reddened and like it's starting to spread and is definitely tender to touch, will speak with pharmacy about an antedote and will notify MD.

## 2017-05-28 NOTE — Progress Notes (Signed)
Spoke with Dr Algie CofferKadakia and addressed IV site with Dobutamine running and that pharmacy recommended using Phentolamine 5-10 mg SQ and I informed him but the site wasn't that big at the time but is starting to grow and is tender to touch. Dr said that he will come up to see it and decide afterwards, will continue to monitor and it's recommended to use heat, will place that now.

## 2017-05-28 NOTE — Progress Notes (Signed)
Right anterior and posterior lower forearm show no s/s of complications and pt states she's having no pain at all,  site looks good. Left wrist site with the Dobutamine running is C/D/I with no s/s of infiltration, will continue to monitor.

## 2017-05-28 NOTE — Progress Notes (Signed)
IV site to right lower forearm is tender to touch when attempting to flush, was able to get blood into the hub but pt jumped with pain from attempting to flush it, iv pulled with catheter intact and dressed with gauze and placed an ice pack to the site, IV team called for new placement since very little access seen by RN and charge RN, will continue to monitor.

## 2017-05-29 LAB — GLUCOSE, CAPILLARY
GLUCOSE-CAPILLARY: 133 mg/dL — AB (ref 65–99)
GLUCOSE-CAPILLARY: 96 mg/dL (ref 65–99)
GLUCOSE-CAPILLARY: 121 mg/dL — AB (ref 65–99)
Glucose-Capillary: 100 mg/dL — ABNORMAL HIGH (ref 65–99)

## 2017-05-29 NOTE — Plan of Care (Signed)
Pt is able to get around in her room but will call for assistance as needed, VSS, Dr Algie CofferKadakia called for IV site starting to become a little sore but it flushes with no S/S of complications, will D/C Dobutamine as per MD and continue to monitor.

## 2017-05-29 NOTE — Plan of Care (Signed)
Pt A&O x 4, VSS, remains on Dobutamine IV with site C/D/I, voiding adequately on IV Lasix, skin remains intact and free from s/s of complications, will continue to monitor. Pt continues to refuse her Heparin SQ and MD is aware. Pt ambulates in room without difficulty.

## 2017-05-29 NOTE — Progress Notes (Signed)
Ref: [name], Tanya Slatten, MD   Subjective:  She has slowly improving leg edema. Right forearm appears stable. Afebrile. Monitor shows Sinus rhythm  Objective:  Vital Signs in the last 24 hours: Temp:  [97.5 F (36.4 C)-98.1 F (36.7 C)] 98 F (36.7 C) (11/19 0701) Pulse Rate:  [56-84] 58 (11/19 0701) Cardiac Rhythm: Normal sinus rhythm (11/19 0800) Resp:  [16-18] 18 (11/19 0701) BP: (130-159)/(60-81) 159/81 (11/19 0701) SpO2:  [100 %] 100 % (11/19 0701) Weight:  [60.5 kg (133 lb 4.8 oz)] 60.5 kg (133 lb 4.8 oz) (11/19 0500)  Physical Exam: BP Readings from Last 1 Encounters:  05/29/17 (!) 159/81     Wt Readings from Last 1 Encounters:  05/29/17 60.5 kg (133 lb 4.8 oz)    Weight change: -2.132 kg (-11.2 oz) Body mass index is 22.88 kg/m. HEENT: [place]/AT, Eyes-Brown, PERL, EOMI, Conjunctiva-Pink, Sclera-Non-icteric Neck: No JVD, No bruit, Trachea midline. Lungs:  Clear, Bilateral. Cardiac:  Regular rhythm, normal S1 and S2, no S3. II/VI systolic murmur. Abdomen:  Soft, non-tender. BS present. Extremities:  2 + edema up to iknees bilaterally present. No cyanosis. No clubbing. CNS: AxOx3, Cranial nerves grossly intact, moves all 4 extremities.  Skin: Warm and dry.   Intake/Output from previous day: 11/18 0701 - 11/19 0700 In: 632.4 [P.O.:570; I.V.:62.4] Out: 2200 [Urine:2200]    Lab Results: BMET    Component Value Date/Time   NA 137 05/28/2017 0518   NA 136 05/26/2017 0600   NA 137 05/24/2017 0423   K 3.8 05/28/2017 0518   K 3.9 05/26/2017 0600   K 4.0 05/24/2017 0423   CL 95 (L) 05/28/2017 0518   CL 100 (L) 05/26/2017 0600   CL 104 05/24/2017 0423   CO2 32 05/28/2017 0518   CO2 25 05/26/2017 0600   CO2 24 05/24/2017 0423   GLUCOSE 110 (H) 05/28/2017 0518   GLUCOSE 74 05/26/2017 0600   GLUCOSE 150 (H) 05/24/2017 0423   BUN 52 (H) 05/28/2017 0518   BUN 53 (H) 05/26/2017 0600   BUN 56 (H) 05/24/2017 0423   CREATININE 1.74 (H) 05/28/2017 0518   CREATININE 1.94 (H)  05/26/2017 0600   CREATININE 2.15 (H) 05/24/2017 0423   CALCIUM 9.0 05/28/2017 0518   CALCIUM 8.8 (L) 05/26/2017 0600   CALCIUM 8.4 (L) 05/24/2017 0423   GFRNONAA 30 (L) 05/28/2017 0518   GFRNONAA 26 (L) 05/26/2017 0600   GFRNONAA 23 (L) 05/24/2017 0423   GFRAA 34 (L) 05/28/2017 0518   GFRAA 30 (L) 05/26/2017 0600   GFRAA 27 (L) 05/24/2017 0423   CBC    Component Value Date/Time   WBC 4.9 05/21/2017 1645   RBC 5.53 (H) 05/21/2017 1645   HGB 14.9 05/21/2017 1645   HCT 45.1 05/21/2017 1645   PLT 199 05/21/2017 1645   MCV 81.6 05/21/2017 1645   MCH 26.9 05/21/2017 1645   MCHC 33.0 05/21/2017 1645   RDW 16.5 (H) 05/21/2017 1645   LYMPHSABS 1.9 05/21/2017 1645   MONOABS 0.5 05/21/2017 1645   EOSABS 0.1 05/21/2017 1645   BASOSABS 0.0 05/21/2017 1645   HEPATIC Function Panel Recent Labs    03/17/17 0224 05/21/17 1645 05/28/17 0518  PROT 7.1 7.0 6.7   HEMOGLOBIN A1C No components found for: HGA1C,  MPG CARDIAC ENZYMES Lab Results  Component Value Date   CKTOTAL 59 09/07/2012   CKMB 3.2 09/07/2012   TROPONINI 0.07 (HH) 05/22/2017   TROPONINI 0.07 (HH) 05/21/2017   TROPONINI 0.08 (HH) 05/21/2017   BNP No results  for input(s): PROBNP in the last 8760 hours. TSH No results for input(s): TSH in the last 8760 hours. CHOLESTEROL No results for input(s): CHOL in the last 8760 hours.  Scheduled Meds: . allopurinol  100 mg Oral Daily  . aspirin EC  81 mg Oral Daily  . carvedilol  6.25 mg Oral BID WC  . collagenase   Topical Daily  . cycloSPORINE  1 drop Both Eyes Daily  . furosemide  80 mg Intravenous Q12H  . heparin  5,000 Units Subcutaneous Q8H  . insulin aspart  0-5 Units Subcutaneous QHS  . insulin aspart  0-9 Units Subcutaneous TID WC  . magnesium oxide  400 mg Oral Daily  . metolazone  5 mg Oral Daily  . polyvinyl alcohol  1 drop Both Eyes Daily  . potassium chloride  10 mEq Oral Daily  . sodium chloride flush  3 mL Intravenous Q12H   Continuous  Infusions: . sodium chloride 250 mL (05/26/17 1741)  . DOBUTamine 5 mcg/kg/min (05/28/17 2133)   PRN Meds:.sodium chloride, acetaminophen, albuterol, ondansetron (ZOFRAN) IV, sodium chloride flush  Assessment/Plan: Acute on chronic biventricular failure Non-sustained VT Hypertension CAD S/P stent in LAD Type II DM Moderate protein calorie malnutrition Moderate MR Severe TR CKD, II  Continue medical treatment and diuresis.    LOS: 8 days    Orpah CobbAjay Hanley Rispoli  MD  05/29/2017, 10:20 AM

## 2017-05-29 NOTE — Progress Notes (Signed)
Dr Algie CofferKadakia called RE: IV site to left wrist is starting to become a little tender but no swelling noted and site flushes. Dr Algie CofferKadakia gave a verbal order just to discontinue the Dobutamine drip, will do and then will place some hot packs to both sites for comfort and elevated both sites on a pillow, will continue to monitor.

## 2017-05-30 LAB — BASIC METABOLIC PANEL
Anion gap: 11 (ref 5–15)
BUN: 63 mg/dL — ABNORMAL HIGH (ref 6–20)
CHLORIDE: 92 mmol/L — AB (ref 101–111)
CO2: 33 mmol/L — AB (ref 22–32)
CREATININE: 1.81 mg/dL — AB (ref 0.44–1.00)
Calcium: 9.1 mg/dL (ref 8.9–10.3)
GFR calc Af Amer: 33 mL/min — ABNORMAL LOW (ref >60)
GFR calc non Af Amer: 28 mL/min — ABNORMAL LOW (ref >60)
Glucose, Bld: 106 mg/dL — ABNORMAL HIGH (ref 65–99)
Potassium: 3.4 mmol/L — ABNORMAL LOW (ref 3.5–5.1)
SODIUM: 136 mmol/L (ref 135–145)

## 2017-05-30 LAB — GLUCOSE, CAPILLARY: Glucose-Capillary: 118 mg/dL — ABNORMAL HIGH (ref 65–99)

## 2017-05-30 MED ORDER — MAGNESIUM OXIDE 400 (241.3 MG) MG PO TABS: 400.0000 mg | ORAL_TABLET | Freq: Every day | ORAL | 3 refills | Status: DC

## 2017-05-30 NOTE — Care Management Note (Signed)
Case Management Note  Patient Details  Name: [name] MRN: [id number] Date of Birth: Nov 25, 1950  Subjective/Objective:  Pt presented for Chronic biventricular systolic heart failure. Plan for home today. Pt was discussed in the [facility] meeting. Pt was recognized as HRI with [facility].                    Action/Plan: [facility]. [facility]. Per pt she is homebound. SOC to begin within 24-48 hours of d/c. No further needs from CM at this time.   Expected Discharge Date:  05/30/17               Expected Discharge Plan:  Home w Home Health Services  In-House Referral:  NA  Discharge planning Services  CM Consult  Post Acute Care Choice:  Home Health Choice offered to:  Patient  DME Arranged:  N/A DME Agency:  NA  HH Arranged:  RN HH Agency[facility]  [facility]  Status of Service:  Completed, signed off  If discussed at Long Length of Stay Meetings, dates discussed:    Additional Comments:  Gala LewandowskyGraves-Bigelow, Tonja Jezewski Kaye, RN 05/30/2017, 10:31 AM

## 2017-05-30 NOTE — Care Management Important Message (Signed)
Important Message  Patient Details  Name: [name] MRN: [id number] Date of Birth: 1950/07/21   Medicare Important Message Given:  Yes    [name], [name] 05/30/2017, 9:13 AM

## 2017-05-30 NOTE — Discharge Summary (Signed)
Physician Discharge Summary  Patient ID: Tanya SpikeMargaret A [name] MRN: [id number] DOB/AGE: Mar 07, 1951 66 y.o.  Admit date: 05/21/2017 Discharge date: 05/30/2017  Admission Diagnoses: Acute on chronic biventricular systolic heart failure Hypertension Coronary artery disease Status post stenting LAD Type 2 diabetes mellitus Moderate malnutrition Moderate mitral regurgitation Severe tricuspid regurgitation CKD 3  Discharge Diagnoses:  Principal Problem: Acute on chronic biventricular heart failure, NYHA class 2 (HCC) Active Problems:   Type 2 diabetes mellitus (HCC)   Essential hypertension   Malnutrition of moderate degree   Coronary artery disease   Status post LAD stent   Moderate mitral valve regurgitation   Severe tricuspid valve regurgitation   CKD 3   Hyperkalemia-resolved  Discharged Condition: fair  Hospital Course: 66 year old female with a known biventricular heart failure, [place] heart association class II-III had a gradual increase in her baseline weight from leg edema associated with shortness of breath. She tried doubling up her Lasix dose with no improvement. She has past medical history of coronary artery disease, gout, hypertension, type 2 diabetes mellitus, dietary noncompliance and a prior history of smoking.  She responded to a combination of IV Lasix, metolazone and IV dobutamine as opposed to IV lasix alone. Her overall condition gradually improved and she was able to ambulate well with no chest pain. She was discharged home in stable condition with a follow-up by me in 1 week.  Consults: cardiology  Significant Diagnostic Studies: labs: Elevated BUN/Cr of 59 and 2.02. Magnesium 2.2. Troponin-I minimally elevated from demand ischemia. BNP-3,206.8 pg. Near normal CBC except elevated Red cell distribution width (RDW).  EKG: NSR, left atrial enlargement. Old ASWMI.  Chest X-ray: Moderate stable cardiomegaly.  Treatments: cardiac meds: Aspirin, carvedilol,  Metolazone, Lasix, potassium and magnesium.  Discharge Exam: Blood pressure 129/67, pulse 61, temperature 97.9 F (36.6 C), temperature source Oral, resp. rate 15, height 5\' 4"  (1.626 m), weight 58.2 kg (128 lb 6.4 oz), SpO2 98 %. General appearance: alert, cooperative and appears stated age. Head: Normocephalic, atraumatic. Eyes: Brown eyes, pink conjunctiva, corneas clear. PERRL, EOM's intact.  Neck: No adenopathy, no carotid bruit, no JVD, supple, symmetrical, trachea midline and thyroid not enlarged. Resp: Clear to auscultation bilaterally. Cardio: Regular rate and rhythm, S1, S2 normal, II/VI systolic murmur, no click, rub or gallop. GI: Soft, non-tender; bowel sounds normal; no organomegaly. Extremities: 2 + edema, cyanosis or clubbing. Skin: Warm and dry.  Neurologic: Alert and oriented X 3, normal strength and tone. Normal coordination and gait.  Disposition: 01-Home or Self Care   Allergies as of 05/30/2017      Reactions   Bidil [isosorb Dinitrate-hydralazine] Itching, Other (See Comments)   Jittery, feels like something is crawling      Medication List    TAKE these medications   acetaminophen 500 MG tablet Commonly known as:  TYLENOL Take 500 mg by mouth every 6 (six) hours as needed for moderate pain or headache.   albuterol 108 (90 Base) MCG/ACT inhaler Commonly known as:  PROVENTIL HFA;VENTOLIN HFA Inhale 2 puffs into the lungs every 6 (six) hours as needed for wheezing or shortness of breath.   allopurinol 100 MG tablet Commonly known as:  ZYLOPRIM Take 1 tablet (100 mg total) by mouth daily.   aspirin EC 81 MG tablet Take 81 mg by mouth daily.   carvedilol 6.25 MG tablet Commonly known as:  COREG Take 1 tablet (6.25 mg total) by mouth 2 (two) times daily with a meal.   collagenase ointment Commonly known as:  SANTYL Apply 1 application topically daily.   cycloSPORINE 0.05 % ophthalmic emulsion Commonly known as:  RESTASIS Place 1 drop into both  eyes daily.   furosemide 80 MG tablet Commonly known as:  LASIX Take 1 tablet (80 mg total) by mouth 2 (two) times daily. What changed:  when to take this   GONIOVISC 2.5 % ophthalmic solution Generic drug:  hydroxypropyl methylcellulose / hypromellose Place 1 drop into both eyes daily.   magnesium oxide 400 (241.3 Mg) MG tablet Commonly known as:  MAG-OX Take 1 tablet (400 mg total) by mouth daily. Start taking on:  05/31/2017   metolazone 5 MG tablet Commonly known as:  ZAROXOLYN Take 1 tablet (5 mg total) by mouth every Monday, Wednesday, and Friday. One hour before AM furosemide - generic Lasix   nitroGLYCERIN 0.4 MG SL tablet Commonly known as:  NITROSTAT Place 0.4 mg under the tongue every 5 (five) minutes as needed for chest pain.   potassium chloride 10 MEQ tablet Commonly known as:  K-DUR,KLOR-CON Take 1 tablet (10 mEq total) by mouth daily.      Follow-up Information    [name], Eular Panek, MD. Schedule an appointment as soon as possible for a visit in 1 week(s).   Specialty:  Cardiology Contact information: [address] [place] [address] [phone]           Signed: Ricki Rodriguezjay S Javonne Dorko 05/30/2017, 10:06 AM

## 2017-07-11 ENCOUNTER — Other Ambulatory Visit: Payer: Self-pay

## 2017-07-11 ENCOUNTER — Emergency Department (HOSPITAL_COMMUNITY)
Admission: EM | Admit: 2017-07-11 | Discharge: 2017-07-12 | Disposition: A | Discharge status: Home / Self Care | Payer: Medicare HMO | Attending: Emergency Medicine | Admitting: Emergency Medicine

## 2017-07-11 ENCOUNTER — Encounter (HOSPITAL_COMMUNITY): Payer: Self-pay

## 2017-07-11 ENCOUNTER — Emergency Department (HOSPITAL_COMMUNITY): Payer: Medicare HMO

## 2017-07-11 DIAGNOSIS — E119 Type 2 diabetes mellitus without complications: Secondary | ICD-10-CM | POA: Diagnosis not present

## 2017-07-11 DIAGNOSIS — Z87891 Personal history of nicotine dependence: Secondary | ICD-10-CM | POA: Diagnosis not present

## 2017-07-11 DIAGNOSIS — I5023 Acute on chronic systolic (congestive) heart failure: Secondary | ICD-10-CM | POA: Insufficient documentation

## 2017-07-11 DIAGNOSIS — Z955 Presence of coronary angioplasty implant and graft: Secondary | ICD-10-CM | POA: Diagnosis not present

## 2017-07-11 DIAGNOSIS — I110 Hypertensive heart disease with heart failure: Secondary | ICD-10-CM | POA: Insufficient documentation

## 2017-07-11 DIAGNOSIS — R2243 Localized swelling, mass and lump, lower limb, bilateral: Secondary | ICD-10-CM | POA: Insufficient documentation

## 2017-07-11 DIAGNOSIS — Z79899 Other long term (current) drug therapy: Secondary | ICD-10-CM | POA: Diagnosis not present

## 2017-07-11 DIAGNOSIS — Z7982 Long term (current) use of aspirin: Secondary | ICD-10-CM | POA: Diagnosis not present

## 2017-07-11 DIAGNOSIS — R609 Edema, unspecified: Secondary | ICD-10-CM

## 2017-07-11 DIAGNOSIS — I2510 Atherosclerotic heart disease of native coronary artery without angina pectoris: Secondary | ICD-10-CM | POA: Insufficient documentation

## 2017-07-11 LAB — BASIC METABOLIC PANEL
ANION GAP: 11 (ref 5–15)
BUN: 55 mg/dL — ABNORMAL HIGH (ref 6–20)
CALCIUM: 9.2 mg/dL (ref 8.9–10.3)
CO2: 26 mmol/L (ref 22–32)
CREATININE: 1.59 mg/dL — AB (ref 0.44–1.00)
Chloride: 102 mmol/L (ref 101–111)
GFR, EST AFRICAN AMERICAN: 38 mL/min — AB (ref >60)
GFR, EST NON AFRICAN AMERICAN: 33 mL/min — AB (ref >60)
Glucose, Bld: 109 mg/dL — ABNORMAL HIGH (ref 65–99)
Potassium: 3.3 mmol/L — ABNORMAL LOW (ref 3.5–5.1)
SODIUM: 139 mmol/L (ref 135–145)

## 2017-07-11 LAB — CBC
HCT: 43.7 % (ref 36.0–46.0)
HEMOGLOBIN: 14.1 g/dL (ref 12.0–15.0)
MCH: 26.3 pg (ref 26.0–34.0)
MCHC: 32.3 g/dL (ref 30.0–36.0)
MCV: 81.5 fL (ref 78.0–100.0)
Platelets: 188 10*3/uL (ref 150–400)
RBC: 5.36 MIL/uL — AB (ref 3.87–5.11)
RDW: 18.6 % — ABNORMAL HIGH (ref 11.5–15.5)
WBC: 4.6 10*3/uL (ref 4.0–10.5)

## 2017-07-11 LAB — I-STAT TROPONIN, ED: TROPONIN I, POC: 0.01 ng/mL (ref 0.00–0.08)

## 2017-07-11 MED ORDER — MORPHINE SULFATE (PF) 4 MG/ML IV SOLN
4.0000 mg | Freq: Once | INTRAVENOUS | Status: AC
  Administered 2017-07-11: 4 mg via INTRAVENOUS
  Filled 2017-07-11: qty 1

## 2017-07-11 MED ORDER — FUROSEMIDE 10 MG/ML IJ SOLN
40.0000 mg | INTRAMUSCULAR | Status: AC
  Administered 2017-07-11: 40 mg via INTRAVENOUS
  Filled 2017-07-11: qty 4

## 2017-07-11 MED ORDER — CEPHALEXIN 500 MG PO CAPS: 500.0000 mg | ORAL_CAPSULE | Freq: Two times a day (BID) | ORAL | 0 refills | Status: AC

## 2017-07-11 NOTE — ED Triage Notes (Signed)
Onset several weeks swelling in bilateral LE.  Onset several days ago legs now weeping and very painful.  No chest pain or shortness of breath.  Pt reports no swelling in upper legs or abd.

## 2017-07-11 NOTE — ED Provider Notes (Signed)
[facility] Provider Note   CSN: [id number] Arrival date & time: 07/11/17  1339     History   Chief Complaint Chief Complaint  Patient presents with  . Leg Swelling    HPI [name] is a 67 y.o. female.  HPI  The patient is a 67 year old female, she has a known history of congestive heart failure, she has hypertension and has had diabetes.  She has had bilateral lower extremity swelling for some time but has noted recently over the last couple of days that she has developed some blistering areas on the lower legs especially on the left 2 of which have ruptured and been draining clear effluent.  She called her cardiologist and was instructed to come to the emergency department for evaluation.  She denies fevers or chills, she does have some tenderness over these open wounds.  Nothing seems to make it better or worse, dressings have been placed on the legs.  The patient endorses using her compression stockings only twice a week  Past Medical History:  Diagnosis Date  . Biventricular heart failure (HCC)    systolic/notes 07/25/2016  . CHF (congestive heart failure) (HCC)   . Coronary artery disease   . GERD (gastroesophageal reflux disease)   . Gout   . High cholesterol   . Hypertension   . Shortness of breath dyspnea   . Stomach ulcer   . Type II diabetes mellitus [facility](HCC)     Patient Active Problem List   Diagnosis Date Noted  . Acute on chronic systolic CHF (congestive heart failure), NYHA class 3 (HCC) 05/21/2017  . Acute systolic heart failure (HCC) 03/11/2017  . Malnutrition of moderate degree 07/27/2016  . Biventricular heart failure, NYHA class 2 (HCC) 08/12/2014  . PROTEINURIA, MILD 07/21/2006  . Diabetes mellitus (HCC) 05/24/2006  . HYPERLIPIDEMIA 05/24/2006  . Essential hypertension 05/24/2006    Past Surgical History:  Procedure Laterality Date  . CARDIAC CATHETERIZATION  2014  . CORONARY ANGIOPLASTY WITH STENT  PLACEMENT  2013  . LEFT AND RIGHT HEART CATHETERIZATION WITH CORONARY ANGIOGRAM N/A 11/25/2011   Procedure: LEFT AND RIGHT HEART CATHETERIZATION WITH CORONARY ANGIOGRAM;  Surgeon: Robynn PaneMohan N Harwani, MD;  Location: [facility] CATH LAB;  Service: Cardiovascular;  Laterality: N/A;  . LEFT AND RIGHT HEART CATHETERIZATION WITH CORONARY ANGIOGRAM N/A 09/07/2012   Procedure: LEFT AND RIGHT HEART CATHETERIZATION WITH CORONARY ANGIOGRAM;  Surgeon: Ricki RodriguezAjay S Kadakia, MD;  Location: [facility] CATH LAB;  Service: Cardiovascular;  Laterality: N/A;  . PERCUTANEOUS CORONARY STENT INTERVENTION (PCI-S) Right 11/25/2011   Procedure: PERCUTANEOUS CORONARY STENT INTERVENTION (PCI-S);  Surgeon: Robynn PaneMohan N Harwani, MD;  [facility];  Service: Cardiovascular;  Laterality: Right;  . RIGHT/LEFT HEART CATH AND CORONARY ANGIOGRAPHY N/A 09/30/2016   Procedure: Right/Left Heart Cath and Coronary Angiography;  Surgeon: Orpah CobbAjay Kadakia, MD;  Location: [facility] INVASIVE CV LAB;  Service: Cardiovascular;  Laterality: N/A;  . TONSILLECTOMY  1950's    OB History    No data available       Home Medications    Prior to Admission medications   Medication Sig Start Date End Date Taking? Authorizing Provider  acetaminophen (TYLENOL) 500 MG tablet Take 500 mg by mouth every 6 (six) hours as needed for moderate pain or headache.   Yes [provider]  albuterol (PROVENTIL HFA;VENTOLIN HFA) 108 (90 Base) MCG/ACT inhaler Inhale 2 puffs into the lungs every 6 (six) hours as needed for wheezing or shortness of breath.  Yes [provider]  allopurinol (ZYLOPRIM) 100 MG tablet Take 1 tablet (100 mg total) by mouth daily. 08/03/16  Yes [name], MD  aspirin EC 81 MG tablet Take 81 mg by mouth daily.   Yes [provider]  carvedilol (COREG) 6.25 MG tablet Take 1 tablet (6.25 mg total) by mouth 2 (two) times daily with a meal. 10/01/16  Yes [name], MD  collagenase (SANTYL) ointment Apply 1 application topically daily.   Yes  [provider]  cycloSPORINE (RESTASIS) 0.05 % ophthalmic emulsion Place 1 drop into both eyes daily.   Yes [provider]  furosemide (LASIX) 80 MG tablet Take 1 tablet (80 mg total) by mouth 2 (two) times daily. Patient taking differently: Take 80 mg 3 (three) times daily by mouth.  03/17/17  Yes [name], MD  hydroxypropyl methylcellulose / hypromellose (GONIOVISC) 2.5 % ophthalmic solution Place 1 drop into both eyes daily.   Yes [provider]  magnesium oxide (MAG-OX) 400 (241.3 Mg) MG tablet Take 1 tablet (400 mg total) by mouth daily. 05/31/17  Yes [name], MD  metolazone (ZAROXOLYN) 5 MG tablet Take 1 tablet (5 mg total) by mouth every Monday, Wednesday, and Friday. One hour before AM furosemide - generic Lasix 03/17/17  Yes [name], MD  nitroGLYCERIN (NITROSTAT) 0.4 MG SL tablet Place 0.4 mg under the tongue every 5 (five) minutes as needed for chest pain. 09/10/12  Yes [name], MD  potassium chloride (K-DUR,KLOR-CON) 10 MEQ tablet Take 1 tablet (10 mEq total) by mouth daily. 08/03/16  Yes [name], MD  cephALEXin (KEFLEX) 500 MG capsule Take 1 capsule (500 mg total) by mouth 2 (two) times daily for 7 days. 07/11/17 07/18/17  Eber Hong, MD    Family History History reviewed. No pertinent family history.  Social History Social History   Tobacco Use  . Smoking status: Former Smoker    Packs/day: 0.50    Years: 20.00    Pack years: 10.00    Types: Cigarettes  . Smokeless tobacco: Never Used  . Tobacco comment: quit smoking in the 80's  Substance Use Topics  . Alcohol use: No  . Drug use: No     Allergies   Bidil [isosorb dinitrate-hydralazine]   Review of Systems Review of Systems  All other systems reviewed and are negative.    Physical Exam Updated Vital Signs BP (!) 170/95   Pulse 87   Temp (!) 97.4 F (36.3 C) (Oral)   Resp 17   SpO2 95%   Physical Exam  Constitutional: She appears well-developed and  well-nourished. No distress.  HENT:  Head: Normocephalic and atraumatic.  Mouth/Throat: Oropharynx is clear and moist. No oropharyngeal exudate.  Eyes: Conjunctivae and EOM are normal. Pupils are equal, round, and reactive to light. Right eye exhibits no discharge. Left eye exhibits no discharge. No scleral icterus.  Neck: Normal range of motion. Neck supple. No JVD present. No thyromegaly present.  Cardiovascular: Normal rate, regular rhythm, normal heart sounds and intact distal pulses. Exam reveals no gallop and no friction rub.  No murmur heard. Pulmonary/Chest: Effort normal and breath sounds normal. No respiratory distress. She has no wheezes. She has no rales.  Abdominal: Soft. Bowel sounds are normal. She exhibits no distension and no mass. There is no tenderness.  Genitourinary:  Genitourinary Comments: Bilateral lower extremity edema left slightly more than right.  There is an open wound on the LLE - there are 2 - the RLE has no  weeping wounds - there is pitting edema, there is no redness of the skin - redness, warmth, fever.  Musculoskeletal: Normal range of motion. She exhibits edema and tenderness.  Lymphadenopathy:    She has no cervical adenopathy.  Neurological: She is alert. Coordination normal.  Skin: Skin is warm and dry. No rash noted. No erythema.  Psychiatric: She has a normal mood and affect. Her behavior is normal.  Nursing note and vitals reviewed.    ED Treatments / Results  Labs (all labs ordered are listed, but only abnormal results are displayed) Labs Reviewed  BASIC METABOLIC PANEL - Abnormal; Notable for the following components:      Result Value   Potassium 3.3 (*)    Glucose, Bld 109 (*)    BUN 55 (*)    Creatinine, Ser 1.59 (*)    GFR calc non Af Amer 33 (*)    GFR calc Af Amer 38 (*)    All other components within normal limits  CBC - Abnormal; Notable for the following components:   RBC 5.36 (*)    RDW 18.6 (*)    All other components within  normal limits  I-STAT TROPONIN, ED    Radiology Dg Chest 2 View  Result Date: 07/11/2017 CLINICAL DATA:  Lower extremity swelling. EXAM: CHEST  2 VIEW COMPARISON:  05/21/2017 FINDINGS: AP and lateral views of the chest were obtained. The cardio pericardial silhouette is enlarged. The lungs are clear without focal pneumonia, edema, pneumothorax or pleural effusion. The visualized bony structures of the thorax are intact. IMPRESSION: Stable exam.  Cardiomegaly without acute findings. Electronically Signed   By: Kennith Center M.D.   On: 07/11/2017 14:41    Procedures Procedures (including critical care time)  Medications Ordered in ED Medications  furosemide (LASIX) injection 40 mg (40 mg Intravenous Given 07/11/17 2246)  morphine 4 MG/ML injection 4 mg (4 mg Intravenous Given 07/11/17 2246)     Initial Impression / Assessment and Plan / ED Course  I have reviewed the triage vital signs and the nursing notes.  Pertinent labs & imaging results that were available during my care of the patient were reviewed by me and considered in my medical decision making (see chart for details).    X-ray unremarkable, legs with edema, the patient has chronic edema, chronic CHF, Lasix given, the patient will need compression wraps, antibiotic ointment and prophylactic antibiotics with big open wounds.  She does not appear septic or in any respiratory distress, doubt the need for admission.  I discussed her care with her primary cardiologist [name] who is agreeable with discharge.  The patient has been given the medications as above, she has good pain control, she will be given prophylactic antibiotics, her legs have been dressed with a sterile dressings and compression dressings, she has no signs of overt pulmonary edema or worsening fluid overload other than the increased fluid in her legs.  She expressed her understanding to the treatment plan and is stable for discharge at this time.  She is aware of the  indications for return  Final Clinical Impressions(s) / ED Diagnoses   Final diagnoses:  Peripheral edema    ED Discharge Orders        Ordered    cephALEXin (KEFLEX) 500 MG capsule  2 times daily     07/11/17 2352       Eber Hong, MD 07/11/17 2355

## 2017-07-11 NOTE — ED Notes (Signed)
Attempted IV x2 w/ no success. Charge RN attempted as well w/ no success. IVT consult in place.

## 2017-07-11 NOTE — Discharge Instructions (Signed)
Your treatment includes not only continuing the Lasix - 3 times a day But also taking Keflex twice daily to prevent infection in your open wounds.  Please keep the wounds dressed with a sterile gauze after you apply lots of antibacterial ointment -  You MUST wear compression stockings OR use the same COBAN wrap that we have used for the next few days - this will help the legs to not get to swollen.    If you should have increased difficulty breathing, increased swelling, increased fevers or pain he should return to the emergency department immediately, otherwise follow-up with your family doctor within the next 2 or 3 days for a recheck in the office.  Your wounds will likely take several weeks to heal up.

## 2017-07-12 ENCOUNTER — Telehealth: Payer: Self-pay | Admitting: *Deleted

## 2017-07-12 NOTE — Telephone Encounter (Signed)
Pharmacy called related to Rx: KEFLEX dosage quanity .Marland Kitchen.Marland Kitchen.EDCM clarified with EDP ([place]) to change Rx to: #14.

## 2017-07-24 ENCOUNTER — Ambulatory Visit (HOSPITAL_COMMUNITY)
Admission: EM | Admit: 2017-07-24 | Discharge: 2017-07-24 | Disposition: A | Discharge status: Home / Self Care | Payer: Medicare HMO | Attending: Family Medicine | Admitting: Family Medicine

## 2017-07-24 ENCOUNTER — Other Ambulatory Visit: Payer: Self-pay

## 2017-07-24 ENCOUNTER — Encounter (HOSPITAL_COMMUNITY): Payer: Self-pay | Admitting: Emergency Medicine

## 2017-07-24 DIAGNOSIS — L97929 Non-pressure chronic ulcer of unspecified part of left lower leg with unspecified severity: Secondary | ICD-10-CM

## 2017-07-24 DIAGNOSIS — L97919 Non-pressure chronic ulcer of unspecified part of right lower leg with unspecified severity: Secondary | ICD-10-CM | POA: Diagnosis not present

## 2017-07-24 DIAGNOSIS — I5022 Chronic systolic (congestive) heart failure: Secondary | ICD-10-CM | POA: Diagnosis not present

## 2017-07-24 DIAGNOSIS — R600 Localized edema: Secondary | ICD-10-CM

## 2017-07-24 MED ORDER — SILVER SULFADIAZINE 1 % EX CREA: 1.0000 "application " | TOPICAL_CREAM | Freq: Every day | CUTANEOUS | 0 refills | Status: DC

## 2017-07-24 MED ORDER — TRAMADOL HCL 50 MG PO TABS: 50.0000 mg | ORAL_TABLET | Freq: Four times a day (QID) | ORAL | 0 refills | Status: DC | PRN

## 2017-07-24 NOTE — ED Notes (Addendum)
Bilateral leg swelling, open wounds, wounds oozing, foul odor.  Left is worse than right.  Patient says she has been like this before.  Patient reports having home health nurses in the past-patient does not speak kindly of home health

## 2017-07-24 NOTE — Discharge Instructions (Signed)
It is very important to keep your legs elevated at least 2 hours in the morning and 2 hours in the afternoon.  In addition, try to elevate the foot of the bed when you are sleeping 6 inches to help gravity move the fluid back to the heart.  Please call the wound center at the number listed below and tell them that you need an appointment and that we have made a referral for you.

## 2017-07-24 NOTE — ED Triage Notes (Signed)
Pt c/o bilateral ulcers on both legs, hx of diabetes. Pt tried to go to the wound center but they said they dont take walk ins and need a referral. Pt c/o leg pain. Pt states "ive been having sores on my legs for a long time".

## 2017-07-24 NOTE — ED Provider Notes (Signed)
[facility]   [id number] 07/24/17 Arrival Time: 1440   SUBJECTIVE:  [name] is a 67 y.o. female who presents to the urgent care with complaint of bilateral ulcers on both legs, hx of diabetes. Pt tried to go to the wound center but they said they dont take walk ins and need a referral. Pt c/o leg pain. Pt states "ive been having sores on my legs for a long time".  Three weeks of progressive exfoliation.  Has had this in the past.   Past Medical History:  Diagnosis Date  . Biventricular heart failure (HCC)    systolic/notes 07/25/2016  . CHF (congestive heart failure) (HCC)   . Coronary artery disease   . GERD (gastroesophageal reflux disease)   . Gout   . High cholesterol   . Hypertension   . Shortness of breath dyspnea   . Stomach ulcer   . Type II diabetes mellitus (HCC)    No family history on file. Social History   Socioeconomic History  . Marital status: Married    Spouse name: Not on file  . Number of children: Not on file  . Years of education: Not on file  . Highest education level: Not on file  Social Needs  . Financial resource strain: Not on file  . Food insecurity - worry: Not on file  . Food insecurity - inability: Not on file  . Transportation needs - medical: Not on file  . Transportation needs - non-medical: Not on file  Occupational History  . Not on file  Tobacco Use  . Smoking status: Former Smoker    Packs/day: 0.50    Years: 20.00    Pack years: 10.00    Types: Cigarettes  . Smokeless tobacco: Never Used  . Tobacco comment: quit smoking in the 80's  Substance and Sexual Activity  . Alcohol use: No  . Drug use: No  . Sexual activity: No  Other Topics Concern  . Not on file  Social History Narrative  . Not on file   No outpatient medications have been marked as taking for the 07/24/17 encounter [facility](Hospital Encounter).   Allergies  Allergen Reactions  . Bidil [Isosorb Dinitrate-Hydralazine] Itching and Other (See  Comments)    Jittery, feels like something is crawling      ROS: As per HPI, remainder of ROS negative.   OBJECTIVE:   Vitals:   07/24/17 1511  BP: (!) 158/83  Pulse: 74  Resp: 18  Temp: (!) 97.4 F (36.3 C)  SpO2: 97%     General appearance: alert; no distress Eyes: PERRL; EOMI; conjunctiva normal HENT: normocephalic; atraumatic;  nasal mucosa normal; oral mucosa normal Neck: supple Back: no CVA tenderness Extremities: no cyanosis or edema; symmetrical with no gross deformities Skin: moderate left leg edema with bilateral pretibial superficial ulcers and skin peeling. Neurologic: normal gait; grossly normal Psychological: alert and cooperative; normal mood and affect      Labs:  Results for orders placed or performed during the hospital encounter of 07/11/17  Basic metabolic panel  Result Value Ref Range   Sodium 139 135 - 145 mmol/L   Potassium 3.3 (L) 3.5 - 5.1 mmol/L   Chloride 102 101 - 111 mmol/L   CO2 26 22 - 32 mmol/L   Glucose, Bld 109 (H) 65 - 99 mg/dL   BUN 55 (H) 6 - 20 mg/dL   Creatinine, Ser 4.091.59 (H) 0.44 - 1.00 mg/dL   Calcium 9.2 8.9 - 81.110.3 mg/dL  GFR calc non Af Amer 33 (L) >60 mL/min   GFR calc Af Amer 38 (L) >60 mL/min   Anion gap 11 5 - 15  CBC  Result Value Ref Range   WBC 4.6 4.0 - 10.5 K/uL   RBC 5.36 (H) 3.87 - 5.11 MIL/uL   Hemoglobin 14.1 12.0 - 15.0 g/dL   HCT 16.1 09.6 - 04.5 %   MCV 81.5 78.0 - 100.0 fL   MCH 26.3 26.0 - 34.0 pg   MCHC 32.3 30.0 - 36.0 g/dL   RDW 40.9 (H) 81.1 - 91.4 %   Platelets 188 150 - 400 K/uL  I-stat troponin, ED  Result Value Ref Range   Troponin i, poc 0.01 0.00 - 0.08 ng/mL   Comment 3            Labs Reviewed - No data to display  No results found.     ASSESSMENT & PLAN:  1. Chronic systolic congestive heart failure (HCC)   2. Pedal edema   3. Ulcers of both lower legs (HCC)     Meds ordered this encounter  Medications  . silver sulfADIAZINE (SILVADENE) 1 % cream    Sig:  Apply 1 application topically daily.    Dispense:  400 g    Refill:  0  . traMADol (ULTRAM) 50 MG tablet    Sig: Take 1 tablet (50 mg total) by mouth every 6 (six) hours as needed.    Dispense:  30 tablet    Refill:  0    Reviewed expectations re: course of current medical issues. Questions answered. Outlined signs and symptoms indicating need for more acute intervention. Patient verbalized understanding. After Visit Summary given.    Procedures:  Referred to wound center at [facility], MD 07/24/17 1620

## 2017-07-26 ENCOUNTER — Other Ambulatory Visit: Payer: Self-pay

## 2017-07-26 ENCOUNTER — Emergency Department (HOSPITAL_COMMUNITY): Payer: Medicare HMO

## 2017-07-26 ENCOUNTER — Emergency Department (HOSPITAL_BASED_OUTPATIENT_CLINIC_OR_DEPARTMENT_OTHER): Admit: 2017-07-26 | Discharge: 2017-07-26 | Disposition: A | Discharge status: Home / Self Care | Payer: Medicare HMO

## 2017-07-26 ENCOUNTER — Emergency Department (HOSPITAL_COMMUNITY)
Admission: EM | Admit: 2017-07-26 | Discharge: 2017-07-26 | Disposition: A | Discharge status: Home / Self Care | Payer: Medicare HMO | Attending: Emergency Medicine | Admitting: Emergency Medicine

## 2017-07-26 ENCOUNTER — Encounter (HOSPITAL_COMMUNITY): Payer: Self-pay

## 2017-07-26 DIAGNOSIS — E119 Type 2 diabetes mellitus without complications: Secondary | ICD-10-CM | POA: Diagnosis not present

## 2017-07-26 DIAGNOSIS — M79605 Pain in left leg: Secondary | ICD-10-CM | POA: Insufficient documentation

## 2017-07-26 DIAGNOSIS — I110 Hypertensive heart disease with heart failure: Secondary | ICD-10-CM | POA: Diagnosis not present

## 2017-07-26 DIAGNOSIS — I2510 Atherosclerotic heart disease of native coronary artery without angina pectoris: Secondary | ICD-10-CM | POA: Insufficient documentation

## 2017-07-26 DIAGNOSIS — Z7982 Long term (current) use of aspirin: Secondary | ICD-10-CM | POA: Diagnosis not present

## 2017-07-26 DIAGNOSIS — Z87891 Personal history of nicotine dependence: Secondary | ICD-10-CM | POA: Insufficient documentation

## 2017-07-26 DIAGNOSIS — Z955 Presence of coronary angioplasty implant and graft: Secondary | ICD-10-CM | POA: Diagnosis not present

## 2017-07-26 DIAGNOSIS — T07XXXA Unspecified multiple injuries, initial encounter: Secondary | ICD-10-CM

## 2017-07-26 DIAGNOSIS — R609 Edema, unspecified: Secondary | ICD-10-CM

## 2017-07-26 DIAGNOSIS — Z79899 Other long term (current) drug therapy: Secondary | ICD-10-CM | POA: Insufficient documentation

## 2017-07-26 DIAGNOSIS — I5023 Acute on chronic systolic (congestive) heart failure: Secondary | ICD-10-CM | POA: Diagnosis not present

## 2017-07-26 LAB — CBC WITH DIFFERENTIAL/PLATELET
Basophils Absolute: 0 10*3/uL (ref 0.0–0.1)
Basophils Relative: 0 %
EOS PCT: 0 %
Eosinophils Absolute: 0 10*3/uL (ref 0.0–0.7)
HCT: 45.6 % (ref 36.0–46.0)
Hemoglobin: 14.8 g/dL (ref 12.0–15.0)
LYMPHS ABS: 1.5 10*3/uL (ref 0.7–4.0)
LYMPHS PCT: 23 %
MCH: 27 pg (ref 26.0–34.0)
MCHC: 32.5 g/dL (ref 30.0–36.0)
MCV: 83.1 fL (ref 78.0–100.0)
MONO ABS: 0.5 10*3/uL (ref 0.1–1.0)
Monocytes Relative: 8 %
Neutro Abs: 4.4 10*3/uL (ref 1.7–7.7)
Neutrophils Relative %: 69 %
PLATELETS: 194 10*3/uL (ref 150–400)
RBC: 5.49 MIL/uL — AB (ref 3.87–5.11)
RDW: 19.2 % — ABNORMAL HIGH (ref 11.5–15.5)
WBC: 6.5 10*3/uL (ref 4.0–10.5)

## 2017-07-26 LAB — COMPREHENSIVE METABOLIC PANEL
ALBUMIN: 3.5 g/dL (ref 3.5–5.0)
ALT: 11 U/L — ABNORMAL LOW (ref 14–54)
AST: 22 U/L (ref 15–41)
Alkaline Phosphatase: 79 U/L (ref 38–126)
Anion gap: 14 (ref 5–15)
BUN: 63 mg/dL — AB (ref 6–20)
CHLORIDE: 107 mmol/L (ref 101–111)
CO2: 19 mmol/L — AB (ref 22–32)
Calcium: 9.4 mg/dL (ref 8.9–10.3)
Creatinine, Ser: 2.08 mg/dL — ABNORMAL HIGH (ref 0.44–1.00)
GFR calc Af Amer: 28 mL/min — ABNORMAL LOW (ref >60)
GFR calc non Af Amer: 24 mL/min — ABNORMAL LOW (ref >60)
GLUCOSE: 89 mg/dL (ref 65–99)
POTASSIUM: 4.4 mmol/L (ref 3.5–5.1)
SODIUM: 140 mmol/L (ref 135–145)
Total Bilirubin: 3.9 mg/dL — ABNORMAL HIGH (ref 0.3–1.2)
Total Protein: 7.9 g/dL (ref 6.5–8.1)

## 2017-07-26 LAB — I-STAT CG4 LACTIC ACID, ED
LACTIC ACID, VENOUS: 2.46 mmol/L — AB (ref 0.5–1.9)
Lactic Acid, Venous: 1.63 mmol/L (ref 0.5–1.9)

## 2017-07-26 LAB — BRAIN NATRIURETIC PEPTIDE: B Natriuretic Peptide: 2005.4 pg/mL — ABNORMAL HIGH (ref 0.0–100.0)

## 2017-07-26 MED ORDER — TRAMADOL HCL 50 MG PO TABS: 50.0000 mg | ORAL_TABLET | Freq: Two times a day (BID) | ORAL | 0 refills | Status: DC | PRN

## 2017-07-26 MED ORDER — CEPHALEXIN 500 MG PO CAPS: 500.0000 mg | ORAL_CAPSULE | Freq: Two times a day (BID) | ORAL | 1 refills | Status: DC

## 2017-07-26 MED ORDER — ACETAMINOPHEN 500 MG PO TABS
1000.0000 mg | ORAL_TABLET | Freq: Once | ORAL | Status: AC
Start: 2017-07-26 — End: 2017-07-26
  Administered 2017-07-26: 1000 mg via ORAL
  Filled 2017-07-26: qty 2

## 2017-07-26 MED ORDER — SILVER SULFADIAZINE 1 % EX CREA: TOPICAL_CREAM | Freq: Two times a day (BID) | CUTANEOUS | 0 refills | Status: DC

## 2017-07-26 MED ORDER — CEPHALEXIN 250 MG PO CAPS
500.0000 mg | ORAL_CAPSULE | Freq: Two times a day (BID) | ORAL | Status: DC
  Administered 2017-07-26: 500 mg via ORAL
  Filled 2017-07-26: qty 2

## 2017-07-26 MED ORDER — SILVER SULFADIAZINE 1 % EX CREA
TOPICAL_CREAM | Freq: Two times a day (BID) | CUTANEOUS | Status: DC
  Administered 2017-07-26: 20:00:00 via TOPICAL
  Filled 2017-07-26: qty 85

## 2017-07-26 MED ORDER — SILVER SULFADIAZINE 1 % EX CREA
TOPICAL_CREAM | Freq: Two times a day (BID) | CUTANEOUS | 0 refills | Status: DC
Start: 2017-07-26 — End: 2017-10-24

## 2017-07-26 MED ORDER — FUROSEMIDE 80 MG PO TABS: 80.0000 mg | ORAL_TABLET | Freq: Three times a day (TID) | ORAL | Status: DC

## 2017-07-26 NOTE — Progress Notes (Signed)
Left lower extremity venous duplex has been completed. Negative for obvious evidence of DVT. Results were given to Mathews RobinsonsJessica Mitchell PA.  07/26/17 5:24 PM Olen CordialGreg Memory Heinrichs RVT

## 2017-07-26 NOTE — ED Provider Notes (Signed)
[facility] EMERGENCY DEPARTMENT Provider Note   CSN: [id number] Arrival date & time: 07/26/17  1244     History   Chief Complaint Chief Complaint  Patient presents with  . Leg Pain    HPI [name] is a 67 y.o. female with past medical history significant for CAD, CHF, gout, hyperlipidemia, hypertension, and type 2 diabetes presenting with bilateral leg ulcers draining and lower extremity edema with the left greater than right and pain on the left side.  Patient seen at urgent care 2 days ago for same and discharged with follow-up with the wound care clinic.  She currently has an appointment on Monday at 9 AM.  She was seen on 07/11/17 in the emergency department for lower extremity edema and discharged with Keflex for 1 week.  Ported that she completed her course and improved but the edema never really improved.  She is currently on Lasix and takes 1 pill in the morning and 1 at nighttime she is unsure about the milligrams.  Her last admission was 05/30/17 for CHF bilateral lower extremity edema.  She currently denies any chest pain, shortness of breath, fever, nausea vomiting diarrhea or any other symptoms.  She states that her left leg appears more swollen and more painful and this is why she is back today.  HPI  Past Medical History:  Diagnosis Date  . Biventricular heart failure (HCC)    systolic/notes 07/25/2016  . CHF (congestive heart failure) (HCC)   . Coronary artery disease   . GERD (gastroesophageal reflux disease)   . Gout   . High cholesterol   . Hypertension   . Shortness of breath dyspnea   . Stomach ulcer   . Type II diabetes mellitus [facility])     Patient Active Problem List   Diagnosis Date Noted  . Acute on chronic systolic CHF (congestive heart failure), NYHA class 3 (HCC) 05/21/2017  . Acute systolic heart failure (HCC) 03/11/2017  . Malnutrition of moderate degree 07/27/2016  . Biventricular heart failure, NYHA class 2 (HCC)  08/12/2014  . PROTEINURIA, MILD 07/21/2006  . Diabetes mellitus (HCC) 05/24/2006  . HYPERLIPIDEMIA 05/24/2006  . Essential hypertension 05/24/2006    Past Surgical History:  Procedure Laterality Date  . CARDIAC CATHETERIZATION  2014  . CORONARY ANGIOPLASTY WITH STENT PLACEMENT  2013  . LEFT AND RIGHT HEART CATHETERIZATION WITH CORONARY ANGIOGRAM N/A 11/25/2011   Procedure: LEFT AND RIGHT HEART CATHETERIZATION WITH CORONARY ANGIOGRAM;  Surgeon: Robynn Pane, MD;  Location: [facility] CATH LAB;  Service: Cardiovascular;  Laterality: N/A;  . LEFT AND RIGHT HEART CATHETERIZATION WITH CORONARY ANGIOGRAM N/A 09/07/2012   Procedure: LEFT AND RIGHT HEART CATHETERIZATION WITH CORONARY ANGIOGRAM;  Surgeon: [name], MD;  Location: [facility] CATH LAB;  Service: Cardiovascular;  Laterality: N/A;  . PERCUTANEOUS CORONARY STENT INTERVENTION (PCI-S) Right 11/25/2011   Procedure: PERCUTANEOUS CORONARY STENT INTERVENTION (PCI-S);  Surgeon: Robynn Pane, MD;  Location: [facility] CATH LAB;  Service: Cardiovascular;  Laterality: Right;  . RIGHT/LEFT HEART CATH AND CORONARY ANGIOGRAPHY N/A 09/30/2016   Procedure: Right/Left Heart Cath and Coronary Angiography;  Surgeon: [name], MD;  Location: [facility] INVASIVE CV LAB;  Service: Cardiovascular;  Laterality: N/A;  . TONSILLECTOMY  1950's    OB History    No data available       Home Medications    Prior to Admission medications   Medication Sig Start Date End Date Taking? Authorizing Provider  acetaminophen (TYLENOL) 500 MG tablet Take 500  mg by mouth every 6 (six) hours as needed for moderate pain or headache.    [provider]  albuterol (PROVENTIL HFA;VENTOLIN HFA) 108 (90 Base) MCG/ACT inhaler Inhale 2 puffs into the lungs every 6 (six) hours as needed for wheezing or shortness of breath.    [provider]  allopurinol (ZYLOPRIM) 100 MG tablet Take 1 tablet (100 mg total) by mouth daily. 08/03/16   [name], Ajay, MD  aspirin EC 81 MG tablet Take  81 mg by mouth daily.    [provider]  carvedilol (COREG) 6.25 MG tablet Take 1 tablet (6.25 mg total) by mouth 2 (two) times daily with a meal. 10/01/16   [name], Ajay, MD  cephALEXin (KEFLEX) 500 MG capsule Take 1 capsule (500 mg total) by mouth every 12 (twelve) hours. 07/26/17   [name], Ajay, MD  collagenase (SANTYL) ointment Apply 1 application topically daily.    [provider]  cycloSPORINE (RESTASIS) 0.05 % ophthalmic emulsion Place 1 drop into both eyes daily.    [provider]  furosemide (LASIX) 80 MG tablet Take 1 tablet (80 mg total) by mouth 3 (three) times daily. 07/26/17   [name], Ajay, MD  hydroxypropyl methylcellulose / hypromellose (GONIOVISC) 2.5 % ophthalmic solution Place 1 drop into both eyes daily.    [provider]  magnesium oxide (MAG-OX) 400 (241.3 Mg) MG tablet Take 1 tablet (400 mg total) by mouth daily. 05/31/17   [name], Ajay, MD  metolazone (ZAROXOLYN) 5 MG tablet Take 1 tablet (5 mg total) by mouth every Monday, Wednesday, and Friday. One hour before AM furosemide - generic Lasix 03/17/17   [name], Ajay, MD  nitroGLYCERIN (NITROSTAT) 0.4 MG SL tablet Place 0.4 mg under the tongue every 5 (five) minutes as needed for chest pain. 09/10/12   [name], Ajay, MD  potassium chloride (K-DUR,KLOR-CON) 10 MEQ tablet Take 1 tablet (10 mEq total) by mouth daily. 08/03/16   [name], Ajay, MD  silver sulfADIAZINE (SILVADENE) 1 % cream Apply topically 2 (two) times daily. 07/26/17   [name], Ajay, MD  traMADol (ULTRAM) 50 MG tablet Take 1 tablet (50 mg total) by mouth 2 (two) times daily as needed. 07/26/17   [name], Ajay, MD    Family History No family history on file.  Social History Social History   Tobacco Use  . Smoking status: Former Smoker    Packs/day: 0.50    Years: 20.00    Pack years: 10.00    Types: Cigarettes  . Smokeless tobacco: Never Used  . Tobacco comment: quit smoking in the 80's  Substance Use Topics  . Alcohol  use: No  . Drug use: No     Allergies   Bidil [isosorb dinitrate-hydralazine]   Review of Systems Review of Systems  Constitutional: Negative for chills, diaphoresis, fatigue and fever.  Eyes: Negative for photophobia, redness and visual disturbance.  Respiratory: Negative for cough, choking, chest tightness, shortness of breath, wheezing and stridor.   Cardiovascular: Positive for leg swelling. Negative for chest pain and palpitations.  Gastrointestinal: Negative for abdominal pain, nausea and vomiting.  Genitourinary: Negative for difficulty urinating and dysuria.  Musculoskeletal: Positive for myalgias. Negative for arthralgias, back pain, gait problem, joint swelling, neck pain and neck stiffness.  Skin: Positive for color change and wound. Negative for pallor and rash.  Neurological: Negative for dizziness, weakness, light-headedness, numbness and headaches.     Physical Exam Updated Vital Signs BP (!) 169/88   Pulse 72   Temp 97.7 F (36.5 C) (  Oral)   Resp 18   Wt 58.1 kg (128 lb)   SpO2 99%   BMI 21.97 kg/m   Physical Exam  Constitutional: She is oriented to person, place, and time. She appears well-developed and well-nourished. No distress.  Afebrile, nontoxic-appearing, lying comfortably in bed no acute distress.  HENT:  Head: Normocephalic and atraumatic.  Eyes: Conjunctivae and EOM are normal. Right eye exhibits no discharge. Left eye exhibits no discharge.  Neck: Normal range of motion. Neck supple.  Cardiovascular: Normal rate, regular rhythm, normal heart sounds and intact distal pulses.  No murmur heard. Dorsalis pedis pulses confirmed by Doppler  Pulmonary/Chest: Effort normal and breath sounds normal. No stridor. No respiratory distress. She has no wheezes. She has no rales. She exhibits no tenderness.  Abdominal: Soft. She exhibits no distension.  Musculoskeletal: Normal range of motion. She exhibits edema and tenderness. She exhibits no deformity.    1+ pitting edema in the left lower extremity and tenderness palpation of the calf. Trace pitting edema on the right lower extremity no tenderness palpation. Full range of motion.  Neurological: She is alert and oriented to person, place, and time. No sensory deficit. She exhibits normal muscle tone.  Proprioception and light touch sensation intact.  5/5 strength in lower extremities.  Neurovascularly intact.  Skin: Skin is warm and dry. No rash noted. She is not diaphoretic. There is erythema. No pallor.  Ulcers to lower extremities bilaterally see pictures below  Psychiatric: She has a normal mood and affect.  Nursing note and vitals reviewed.        ED Treatments / Results  Labs (all labs ordered are listed, but only abnormal results are displayed) Labs Reviewed  COMPREHENSIVE METABOLIC PANEL - Abnormal; Notable for the following components:      Result Value   CO2 19 (*)    BUN 63 (*)    Creatinine, Ser 2.08 (*)    ALT 11 (*)    Total Bilirubin 3.9 (*)    GFR calc non Af Amer 24 (*)    GFR calc Af Amer 28 (*)    All other components within normal limits  CBC WITH DIFFERENTIAL/PLATELET - Abnormal; Notable for the following components:   RBC 5.49 (*)    RDW 19.2 (*)    All other components within normal limits  I-STAT CG4 LACTIC ACID, ED - Abnormal; Notable for the following components:   Lactic Acid, Venous 2.46 (*)    All other components within normal limits  BRAIN NATRIURETIC PEPTIDE  I-STAT CG4 LACTIC ACID, ED  I-STAT TROPONIN, ED    EKG  EKG Interpretation  Date/Time:  Wednesday July 26 2017 17:05:19 EST Ventricular Rate:  81 PR Interval:    QRS Duration: 112 QT Interval:  408 QTC Calculation: 474 R Axis:   131 Text Interpretation:  Sinus rhythm Left atrial enlargement Borderline intraventricular conduction delay Borderline low voltage, extremity leads Minimal ST elevation, inferior leads No STEMI.  Confirmed by Alona Bene [phone]) on 07/26/2017 5:26:21  PM       Radiology Dg Chest 2 View  Result Date: 07/26/2017 CLINICAL DATA:  Bilateral leg wounds EXAM: CHEST  2 VIEW COMPARISON:  07/11/2017 chest x-ray, 05/21/2017 chest x-ray FINDINGS: Cardiomegaly with vascular congestion. No pleural effusion or focal consolidation. Aortic atherosclerosis. No pneumothorax. Degenerative changes of the spine. IMPRESSION: Cardiomegaly with mild central congestion. No edema or pleural effusion. Electronically Signed   By: Jasmine Pang M.D.   On: 07/26/2017 16:30    Procedures  Procedures (including critical care time)  Medications Ordered in ED Medications  cephALEXin (KEFLEX) capsule 500 mg (not administered)  silver sulfADIAZINE (SILVADENE) 1 % cream (not administered)  acetaminophen (TYLENOL) tablet 1,000 mg (1,000 mg Oral Given 07/26/17 1638)     Initial Impression / Assessment and Plan / ED Course  I have reviewed the triage vital signs and the nursing notes.  Pertinent labs & imaging results that were available during my care of the patient were reviewed by me and considered in my medical decision making (see chart for details).     Patient presenting with continued bilateral lower extremity edema and pain with ulcers draining.  Her left leg has 2 larger open wounds and 1+ pitting edema and tenderness to palpation in the calf, right lower extremity has trace edema and small puncture draining wounds.   She was discharged back in November with Lasix 80 mg in the a.m. and 80 mg p.m. and reports being compliant with all medications.  Dorsalis pedis pulses were confirmed by Doppler. Ultrasound to rule out DVT.  Lactate initially elevated at 2.46 and second 1.63.   Patient is otherwise well-appearing, nontoxic afebrile and has no other complaints.  She reports that she is been feeling her normal self just continues to have edema and pain in the left lower extremity.  She reported that she was prescribed tramadol but does not like taking mind  altering medications.  States that she has had improvement with ibuprofen and Tylenol.  She was given Tylenol in the emergency department with improvement.  Ultrasound DVT: negative  Creatinine slightly elevated when compared to last visit 2.08 from 1.59.  Spoke to [name] who has seen patient and recommends dc home with abx and f/u with him in clinic. He reports that she fluctuates with her creatinine which is baseline for her and BNP can be as high as 1000-2000. She can be discharged home.  Dc home with abx, symptomatic relief, wound care instructions and close follow up with [name] and wound care clinic.  Discussed strict return precautions and advised to return to the emergency department if experiencing any new or worsening symptoms. Instructions were understood and patient agreed with discharge plan. Final Clinical Impressions(s) / ED Diagnoses   Final diagnoses:  Left leg pain  Swelling  Wounds, multiple    ED Discharge Orders        Ordered    cephALEXin (KEFLEX) 500 MG capsule  Every 12 hours     07/26/17 1828    furosemide (LASIX) 80 MG tablet  3 times daily     07/26/17 1828    silver sulfADIAZINE (SILVADENE) 1 % cream  2 times daily     07/26/17 1828    traMADol (ULTRAM) 50 MG tablet  2 times daily PRN     07/26/17 1828       Georgiana Shore, PA-C 07/26/17 1940    Maia Plan, MD 07/27/17 [phone]

## 2017-07-26 NOTE — Consult Note (Signed)
Referring Physician:  ERICK OXENDINE is an 67 y.o. female.                       Chief Complaint: Left lower leg wound  HPI: 67 year old female has chronic left leg wound. She ran out of antibiotics 10 days ago and she ran out of Silvadene cream 4 days ago. She has appointment to see wound care doctor on Monday. She denies chest pain or shortness of breath. She has h/o of dilated cardiomyopathy.   Past Medical History:  Diagnosis Date  . Biventricular heart failure ([place])    systolic/notes 11/01/5359  . CHF (congestive heart failure) ([place])   . Coronary artery disease   . GERD (gastroesophageal reflux disease)   . Gout   . High cholesterol   . Hypertension   . Shortness of breath dyspnea   . Stomach ulcer   . Type II diabetes mellitus ([place])       Past Surgical History:  Procedure Laterality Date  . CARDIAC CATHETERIZATION  2014  . CORONARY ANGIOPLASTY WITH STENT PLACEMENT  2013  . LEFT AND RIGHT HEART CATHETERIZATION WITH CORONARY ANGIOGRAM N/A 11/25/2011   Procedure: LEFT AND RIGHT HEART CATHETERIZATION WITH CORONARY ANGIOGRAM;  Surgeon: Clent Demark, MD;  Location: [facility] [facility] CATH LAB;  Service: Cardiovascular;  Laterality: N/A;  . LEFT AND RIGHT HEART CATHETERIZATION WITH CORONARY ANGIOGRAM N/A 09/07/2012   Procedure: LEFT AND RIGHT HEART CATHETERIZATION WITH CORONARY ANGIOGRAM;  Surgeon: Birdie Riddle, MD;  Location: [place] CATH LAB;  Service: Cardiovascular;  Laterality: N/A;  . PERCUTANEOUS CORONARY STENT INTERVENTION (PCI-S) Right 11/25/2011   Procedure: PERCUTANEOUS CORONARY STENT INTERVENTION (PCI-S);  Surgeon: Clent Demark, MD;  Location: [facility] CATH LAB;  Service: Cardiovascular;  Laterality: Right;  . RIGHT/LEFT HEART CATH AND CORONARY ANGIOGRAPHY N/A 09/30/2016   Procedure: Right/Left Heart Cath and Coronary Angiography;  Surgeon: Dixie Dials, MD;  Location: [place] CV LAB;  Service: Cardiovascular;  Laterality: N/A;  . TONSILLECTOMY  1950's    No family history on  file. Social History:  reports that she has quit smoking. Her smoking use included cigarettes. She has a 10.00 pack-year smoking history. she has never used smokeless tobacco. She reports that she does not drink alcohol or use drugs.  Allergies:  Allergies  Allergen Reactions  . Bidil [Isosorb Dinitrate-Hydralazine] Itching and Other (See Comments)    Jittery, feels like something is crawling     (Not in a hospital admission)  Results for orders placed or performed during the hospital encounter of 07/26/17 (from the past 48 hour(s))  Comprehensive metabolic panel     Status: Abnormal   Collection Time: 07/26/17  1:26 PM  Result Value Ref Range   Sodium 140 135 - 145 mmol/L   Potassium 4.4 3.5 - 5.1 mmol/L   Chloride 107 101 - 111 mmol/L   CO2 19 (L) 22 - 32 mmol/L   Glucose, Bld 89 65 - 99 mg/dL   BUN 63 (H) 6 - 20 mg/dL   Creatinine, Ser 2.08 (H) 0.44 - 1.00 mg/dL   Calcium 9.4 8.9 - 10.3 mg/dL   Total Protein 7.9 6.5 - 8.1 g/dL   Albumin 3.5 3.5 - 5.0 g/dL   AST 22 15 - 41 U/L   ALT 11 (L) 14 - 54 U/L   Alkaline Phosphatase 79 38 - 126 U/L   Total Bilirubin 3.9 (H) 0.3 - 1.2 mg/dL   GFR calc non Af Amer 24 (  L) >60 mL/min   GFR calc Af Amer 28 (L) >60 mL/min    Comment: (NOTE) The eGFR has been calculated using the CKD EPI equation. This calculation has not been validated in all clinical situations. eGFR's persistently <60 mL/min signify possible Chronic Kidney Disease.    Anion gap 14 5 - 15  CBC with Differential     Status: Abnormal   Collection Time: 07/26/17  1:26 PM  Result Value Ref Range   WBC 6.5 4.0 - 10.5 K/uL   RBC 5.49 (H) 3.87 - 5.11 MIL/uL   Hemoglobin 14.8 12.0 - 15.0 g/dL   HCT 45.6 36.0 - 46.0 %   MCV 83.1 78.0 - 100.0 fL   MCH 27.0 26.0 - 34.0 pg   MCHC 32.5 30.0 - 36.0 g/dL   RDW 19.2 (H) 11.5 - 15.5 %   Platelets 194 150 - 400 K/uL   Neutrophils Relative % 69 %   Neutro Abs 4.4 1.7 - 7.7 K/uL   Lymphocytes Relative 23 %   Lymphs Abs 1.5 0.7  - 4.0 K/uL   Monocytes Relative 8 %   Monocytes Absolute 0.5 0.1 - 1.0 K/uL   Eosinophils Relative 0 %   Eosinophils Absolute 0.0 0.0 - 0.7 K/uL   Basophils Relative 0 %   Basophils Absolute 0.0 0.0 - 0.1 K/uL  I-Stat CG4 Lactic Acid, ED     Status: Abnormal   Collection Time: 07/26/17  1:43 PM  Result Value Ref Range   Lactic Acid, Venous 2.46 (HH) 0.5 - 1.9 mmol/L   Comment NOTIFIED PHYSICIAN   I-Stat CG4 Lactic Acid, ED     Status: None   Collection Time: 07/26/17  3:46 PM  Result Value Ref Range   Lactic Acid, Venous 1.63 0.5 - 1.9 mmol/L   Dg Chest 2 View  Result Date: 07/26/2017 CLINICAL DATA:  Bilateral leg wounds EXAM: CHEST  2 VIEW COMPARISON:  07/11/2017 chest x-ray, 05/21/2017 chest x-ray FINDINGS: Cardiomegaly with vascular congestion. No pleural effusion or focal consolidation. Aortic atherosclerosis. No pneumothorax. Degenerative changes of the spine. IMPRESSION: Cardiomegaly with mild central congestion. No edema or pleural effusion. Electronically Signed   By: Donavan Foil M.D.   On: 07/26/2017 16:30    Review Of Systems Constitutional: No fever, chills, weight loss or gain. Eyes: No vision change, wears glasses. No discharge or pain. Ears: No hearing loss, No tinnitus. Respiratory: No asthma, COPD, pneumonias. No shortness of breath. No hemoptysis. Cardiovascular: Occasional chest pain, palpitation, leg edema. Gastrointestinal: No nausea, vomiting, diarrhea, constipation. No GI bleed. No hepatitis. Genitourinary: No dysuria, hematuria, kidney stone. No incontinance. Neurological: No headache, stroke, seizures.  Psychiatry: No psych facility admission for anxiety, depression, suicide. No detox. Skin: No rash. Musculoskeletal: Positive joint pain, no fibromyalgia. No neck pain, back pain. Lymphadenopathy: No lymphadenopathy. Hematology: No anemia or easy bruising.   Blood pressure (!) 166/90, pulse 76, temperature 97.7 F (36.5 C), temperature source Oral, resp.  rate (!) 24, weight 58.1 kg (128 lb), SpO2 96 %. Body mass index is 21.97 kg/m. General appearance: alert, cooperative, appears stated age and no distress Head: Normocephalic, atraumatic. Eyes: Brown eyes, pink conjunctiva, corneas clear. PERRL, EOM's intact. Neck: No adenopathy, no carotid bruit, no JVD, supple, symmetrical, trachea midline and thyroid not enlarged. Resp: Clear to auscultation bilaterally. Cardio: Regular rate and rhythm, S1, S2 normal, III/VI systolic murmur, no click, rub or gallop GI: Soft, non-tender; bowel sounds normal; no organomegaly. Extremities: 1 + right and 2 + left lower  leg edema, cyanosis or clubbing. Irregular 2'' and 1" ulcers on mid portion of lower leg with skin discoloration of venous stasis. Skin: Warm and dry.  Neurologic: Alert and oriented X 3, normal strength. Normal coordination and gait.  Assessment/Plan Left leg venous stasis with ulcers and swelling. Hypertension CAD S/P stent in LAD Type 2 DM Moderate protein calorie malnutrition Moderate MR Severe TR CKD, 3 Chronic left heart systolic failure  Resume antibiotics and dressing with silvadene cream. Keep appointment to see wound care doctor.  Birdie Riddle, MD  07/26/2017, 6:14 PM

## 2017-07-26 NOTE — ED Triage Notes (Signed)
Pt presents to the ed for weeping wounds from bilateral legs. Pt was just hospitalized for cellulitis and received antibiotics, states her legs look better than before. She is still having pain and clear fluid leaking from her legs so her doctor advised her to come back here.

## 2017-07-26 NOTE — ED Notes (Signed)
ED Provider at bedside. 

## 2017-07-26 NOTE — ED Notes (Signed)
Lab and radiology results reviewed, vital signs reviewed, results discussed with MD and nurse first. 

## 2017-07-26 NOTE — Discharge Instructions (Signed)
As discussed, take your entire course of antibiotics even if you feel better.  Change your dressing daily and follow-up with the wound clinic at your upcoming appointment.  Follow-up with Dr. Algie CofferKadakia in clinic and follow his instructions from today.  Return if you experience worsening symptoms, chest pain, shortness of breath, or other new concerning symptoms in the meantime.

## 2017-07-26 NOTE — ED Notes (Signed)
Patient transported to X-ray 

## 2017-07-31 ENCOUNTER — Encounter (HOSPITAL_BASED_OUTPATIENT_CLINIC_OR_DEPARTMENT_OTHER): Payer: Medicare HMO | Attending: Internal Medicine

## 2017-07-31 DIAGNOSIS — I2510 Atherosclerotic heart disease of native coronary artery without angina pectoris: Secondary | ICD-10-CM | POA: Diagnosis not present

## 2017-07-31 DIAGNOSIS — Z955 Presence of coronary angioplasty implant and graft: Secondary | ICD-10-CM | POA: Insufficient documentation

## 2017-07-31 DIAGNOSIS — L97221 Non-pressure chronic ulcer of left calf limited to breakdown of skin: Secondary | ICD-10-CM | POA: Insufficient documentation

## 2017-07-31 DIAGNOSIS — Z87891 Personal history of nicotine dependence: Secondary | ICD-10-CM | POA: Insufficient documentation

## 2017-07-31 DIAGNOSIS — L97811 Non-pressure chronic ulcer of other part of right lower leg limited to breakdown of skin: Secondary | ICD-10-CM | POA: Diagnosis not present

## 2017-07-31 DIAGNOSIS — I87333 Chronic venous hypertension (idiopathic) with ulcer and inflammation of bilateral lower extremity: Secondary | ICD-10-CM | POA: Insufficient documentation

## 2017-07-31 DIAGNOSIS — E1151 Type 2 diabetes mellitus with diabetic peripheral angiopathy without gangrene: Secondary | ICD-10-CM | POA: Diagnosis not present

## 2017-07-31 DIAGNOSIS — L97829 Non-pressure chronic ulcer of other part of left lower leg with unspecified severity: Secondary | ICD-10-CM | POA: Diagnosis not present

## 2017-07-31 DIAGNOSIS — I10 Essential (primary) hypertension: Secondary | ICD-10-CM | POA: Insufficient documentation

## 2017-08-03 ENCOUNTER — Encounter (HOSPITAL_COMMUNITY): Payer: Medicare HMO

## 2017-08-03 ENCOUNTER — Other Ambulatory Visit: Payer: Self-pay | Admitting: Internal Medicine

## 2017-08-03 DIAGNOSIS — L97909 Non-pressure chronic ulcer of unspecified part of unspecified lower leg with unspecified severity: Secondary | ICD-10-CM

## 2017-08-04 ENCOUNTER — Encounter (HOSPITAL_BASED_OUTPATIENT_CLINIC_OR_DEPARTMENT_OTHER): Payer: Medicare HMO

## 2017-08-04 DIAGNOSIS — I87333 Chronic venous hypertension (idiopathic) with ulcer and inflammation of bilateral lower extremity: Secondary | ICD-10-CM | POA: Diagnosis not present

## 2017-08-11 ENCOUNTER — Inpatient Hospital Stay (HOSPITAL_COMMUNITY)
Admission: AD | Admit: 2017-08-11 | Discharge: 2017-08-19 | DRG: 602 | Disposition: A | Discharge status: Skilled Nursing Facility | Payer: Medicare HMO | Source: Ambulatory Visit | Attending: Cardiovascular Disease | Admitting: Cardiovascular Disease

## 2017-08-11 ENCOUNTER — Other Ambulatory Visit: Payer: Self-pay

## 2017-08-11 DIAGNOSIS — Z79899 Other long term (current) drug therapy: Secondary | ICD-10-CM | POA: Diagnosis not present

## 2017-08-11 DIAGNOSIS — E1129 Type 2 diabetes mellitus with other diabetic kidney complication: Secondary | ICD-10-CM

## 2017-08-11 DIAGNOSIS — I5023 Acute on chronic systolic (congestive) heart failure: Secondary | ICD-10-CM | POA: Diagnosis present

## 2017-08-11 DIAGNOSIS — Z7982 Long term (current) use of aspirin: Secondary | ICD-10-CM | POA: Diagnosis not present

## 2017-08-11 DIAGNOSIS — I081 Rheumatic disorders of both mitral and tricuspid valves: Secondary | ICD-10-CM | POA: Diagnosis present

## 2017-08-11 DIAGNOSIS — E1122 Type 2 diabetes mellitus with diabetic chronic kidney disease: Secondary | ICD-10-CM | POA: Diagnosis present

## 2017-08-11 DIAGNOSIS — M25562 Pain in left knee: Secondary | ICD-10-CM | POA: Diagnosis not present

## 2017-08-11 DIAGNOSIS — Z87891 Personal history of nicotine dependence: Secondary | ICD-10-CM

## 2017-08-11 DIAGNOSIS — I5082 Biventricular heart failure: Secondary | ICD-10-CM | POA: Diagnosis present

## 2017-08-11 DIAGNOSIS — W19XXXA Unspecified fall, initial encounter: Secondary | ICD-10-CM | POA: Diagnosis not present

## 2017-08-11 DIAGNOSIS — M109 Gout, unspecified: Secondary | ICD-10-CM | POA: Diagnosis present

## 2017-08-11 DIAGNOSIS — E1151 Type 2 diabetes mellitus with diabetic peripheral angiopathy without gangrene: Secondary | ICD-10-CM | POA: Diagnosis present

## 2017-08-11 DIAGNOSIS — Z955 Presence of coronary angioplasty implant and graft: Secondary | ICD-10-CM | POA: Diagnosis not present

## 2017-08-11 DIAGNOSIS — L0390 Cellulitis, unspecified: Secondary | ICD-10-CM | POA: Diagnosis not present

## 2017-08-11 DIAGNOSIS — I130 Hypertensive heart and chronic kidney disease with heart failure and stage 1 through stage 4 chronic kidney disease, or unspecified chronic kidney disease: Secondary | ICD-10-CM | POA: Diagnosis present

## 2017-08-11 DIAGNOSIS — I2510 Atherosclerotic heart disease of native coronary artery without angina pectoris: Secondary | ICD-10-CM | POA: Diagnosis present

## 2017-08-11 DIAGNOSIS — E440 Moderate protein-calorie malnutrition: Secondary | ICD-10-CM | POA: Diagnosis present

## 2017-08-11 DIAGNOSIS — K219 Gastro-esophageal reflux disease without esophagitis: Secondary | ICD-10-CM | POA: Diagnosis present

## 2017-08-11 DIAGNOSIS — L03116 Cellulitis of left lower limb: Principal | ICD-10-CM | POA: Diagnosis present

## 2017-08-11 DIAGNOSIS — L97929 Non-pressure chronic ulcer of unspecified part of left lower leg with unspecified severity: Secondary | ICD-10-CM | POA: Diagnosis present

## 2017-08-11 DIAGNOSIS — Z888 Allergy status to other drugs, medicaments and biological substances status: Secondary | ICD-10-CM | POA: Diagnosis not present

## 2017-08-11 DIAGNOSIS — L03115 Cellulitis of right lower limb: Secondary | ICD-10-CM | POA: Diagnosis present

## 2017-08-11 DIAGNOSIS — E7800 Pure hypercholesterolemia, unspecified: Secondary | ICD-10-CM | POA: Diagnosis present

## 2017-08-11 DIAGNOSIS — N183 Chronic kidney disease, stage 3 (moderate): Secondary | ICD-10-CM | POA: Diagnosis present

## 2017-08-11 DIAGNOSIS — Z6827 Body mass index (BMI) 27.0-27.9, adult: Secondary | ICD-10-CM

## 2017-08-11 DIAGNOSIS — I10 Essential (primary) hypertension: Secondary | ICD-10-CM | POA: Diagnosis present

## 2017-08-11 HISTORY — DX: Type 2 diabetes mellitus without complications: E11.9

## 2017-08-11 HISTORY — DX: Personal history of other diseases of the musculoskeletal system and connective tissue: Z87.39

## 2017-08-11 LAB — CBC WITH DIFFERENTIAL/PLATELET
BASOS ABS: 0 10*3/uL (ref 0.0–0.1)
Basophils Relative: 0 %
EOS PCT: 0 %
Eosinophils Absolute: 0 10*3/uL (ref 0.0–0.7)
HCT: 45.6 % (ref 36.0–46.0)
Hemoglobin: 15.1 g/dL — ABNORMAL HIGH (ref 12.0–15.0)
Lymphocytes Relative: 12 %
Lymphs Abs: 1 10*3/uL (ref 0.7–4.0)
MCH: 27.1 pg (ref 26.0–34.0)
MCHC: 33.1 g/dL (ref 30.0–36.0)
MCV: 81.7 fL (ref 78.0–100.0)
MONO ABS: 0.6 10*3/uL (ref 0.1–1.0)
Monocytes Relative: 7 %
Neutro Abs: 7.1 10*3/uL (ref 1.7–7.7)
Neutrophils Relative %: 81 %
Platelets: 197 10*3/uL (ref 150–400)
RBC: 5.58 MIL/uL — ABNORMAL HIGH (ref 3.87–5.11)
RDW: 18.2 % — AB (ref 11.5–15.5)
WBC: 8.8 10*3/uL (ref 4.0–10.5)

## 2017-08-11 LAB — COMPREHENSIVE METABOLIC PANEL
ALBUMIN: 3.4 g/dL — AB (ref 3.5–5.0)
ALK PHOS: 102 U/L (ref 38–126)
ALT: 10 U/L — ABNORMAL LOW (ref 14–54)
ANION GAP: 17 — AB (ref 5–15)
AST: 18 U/L (ref 15–41)
BILIRUBIN TOTAL: 3.2 mg/dL — AB (ref 0.3–1.2)
BUN: 56 mg/dL — AB (ref 6–20)
CALCIUM: 9 mg/dL (ref 8.9–10.3)
CO2: 27 mmol/L (ref 22–32)
Chloride: 97 mmol/L — ABNORMAL LOW (ref 101–111)
Creatinine, Ser: 1.78 mg/dL — ABNORMAL HIGH (ref 0.44–1.00)
GFR calc Af Amer: 33 mL/min — ABNORMAL LOW (ref >60)
GFR calc non Af Amer: 29 mL/min — ABNORMAL LOW (ref >60)
GLUCOSE: 151 mg/dL — AB (ref 65–99)
Potassium: 2.7 mmol/L — CL (ref 3.5–5.1)
Sodium: 141 mmol/L (ref 135–145)
TOTAL PROTEIN: 8.2 g/dL — AB (ref 6.5–8.1)

## 2017-08-11 MED ORDER — MAGNESIUM OXIDE 400 (241.3 MG) MG PO TABS: 400.0000 mg | ORAL_TABLET | Freq: Every day | ORAL | Status: DC

## 2017-08-11 MED ORDER — ALBUTEROL SULFATE HFA 108 (90 BASE) MCG/ACT IN AERS: 2.0000 | INHALATION_SPRAY | Freq: Four times a day (QID) | RESPIRATORY_TRACT | Status: DC | PRN

## 2017-08-11 MED ORDER — VANCOMYCIN HCL IN DEXTROSE 1-5 GM/200ML-% IV SOLN
1000.0000 mg | Freq: Once | INTRAVENOUS | Status: AC
  Administered 2017-08-11: 1000 mg via INTRAVENOUS
  Filled 2017-08-11: qty 200

## 2017-08-11 MED ORDER — COLLAGENASE 250 UNIT/GM EX OINT
1.0000 "application " | TOPICAL_OINTMENT | Freq: Every day | CUTANEOUS | Status: DC
  Administered 2017-08-12 – 2017-08-16 (×5): 1 via TOPICAL
  Filled 2017-08-11: qty 30

## 2017-08-11 MED ORDER — HYPROMELLOSE (GONIOSCOPIC) 2.5 % OP SOLN: 1.0000 [drp] | Freq: Every day | OPHTHALMIC | Status: DC

## 2017-08-11 MED ORDER — POTASSIUM CHLORIDE CRYS ER 20 MEQ PO TBCR
40.0000 meq | EXTENDED_RELEASE_TABLET | Freq: Once | ORAL | Status: AC
  Administered 2017-08-11: 40 meq via ORAL
  Filled 2017-08-11: qty 2

## 2017-08-11 MED ORDER — VANCOMYCIN HCL 500 MG IV SOLR
500.0000 mg | INTRAVENOUS | Status: DC
  Administered 2017-08-12: 500 mg via INTRAVENOUS
  Filled 2017-08-11 (×2): qty 500

## 2017-08-11 MED ORDER — ALBUTEROL SULFATE (2.5 MG/3ML) 0.083% IN NEBU: 2.5000 mg | INHALATION_SOLUTION | Freq: Four times a day (QID) | RESPIRATORY_TRACT | Status: DC | PRN

## 2017-08-11 MED ORDER — ALLOPURINOL 100 MG PO TABS
100.0000 mg | ORAL_TABLET | Freq: Every day | ORAL | Status: DC
  Administered 2017-08-12 – 2017-08-19 (×8): 100 mg via ORAL
  Filled 2017-08-11 (×8): qty 1

## 2017-08-11 MED ORDER — METOLAZONE 5 MG PO TABS
5.0000 mg | ORAL_TABLET | ORAL | Status: DC
  Administered 2017-08-14 – 2017-08-16 (×2): 5 mg via ORAL
  Filled 2017-08-11 (×2): qty 1

## 2017-08-11 MED ORDER — SODIUM CHLORIDE 0.9 % IV SOLN: 250.0000 mL | INTRAVENOUS | Status: DC | PRN

## 2017-08-11 MED ORDER — POLYVINYL ALCOHOL 1.4 % OP SOLN
1.0000 [drp] | Freq: Every day | OPHTHALMIC | Status: DC
  Administered 2017-08-12 – 2017-08-19 (×7): 1 [drp] via OPHTHALMIC
  Filled 2017-08-11: qty 15

## 2017-08-11 MED ORDER — OXYCODONE HCL 5 MG PO TABS
5.0000 mg | ORAL_TABLET | Freq: Four times a day (QID) | ORAL | Status: DC | PRN
  Administered 2017-08-11 – 2017-08-19 (×9): 5 mg via ORAL
  Filled 2017-08-11 (×9): qty 1

## 2017-08-11 MED ORDER — POTASSIUM CHLORIDE CRYS ER 10 MEQ PO TBCR
10.0000 meq | EXTENDED_RELEASE_TABLET | Freq: Every day | ORAL | Status: DC
  Administered 2017-08-12 – 2017-08-19 (×8): 10 meq via ORAL
  Filled 2017-08-11 (×8): qty 1

## 2017-08-11 MED ORDER — FUROSEMIDE 10 MG/ML IJ SOLN
40.0000 mg | Freq: Two times a day (BID) | INTRAMUSCULAR | Status: DC
  Administered 2017-08-11 – 2017-08-12 (×2): 40 mg via INTRAVENOUS
  Filled 2017-08-11 (×2): qty 4

## 2017-08-11 MED ORDER — CYCLOSPORINE 0.05 % OP EMUL
1.0000 [drp] | Freq: Every day | OPHTHALMIC | Status: DC
  Administered 2017-08-12 – 2017-08-19 (×8): 1 [drp] via OPHTHALMIC
  Filled 2017-08-11 (×8): qty 1

## 2017-08-11 MED ORDER — CARVEDILOL 6.25 MG PO TABS
6.2500 mg | ORAL_TABLET | Freq: Two times a day (BID) | ORAL | Status: DC
  Administered 2017-08-11 – 2017-08-19 (×16): 6.25 mg via ORAL
  Filled 2017-08-11 (×17): qty 1

## 2017-08-11 MED ORDER — HEPARIN SODIUM (PORCINE) 5000 UNIT/ML IJ SOLN
5000.0000 [IU] | Freq: Three times a day (TID) | INTRAMUSCULAR | Status: DC
  Administered 2017-08-11 – 2017-08-17 (×14): 5000 [IU] via SUBCUTANEOUS
  Filled 2017-08-11 (×18): qty 1

## 2017-08-11 MED ORDER — SODIUM CHLORIDE 0.9% FLUSH
3.0000 mL | Freq: Two times a day (BID) | INTRAVENOUS | Status: DC
  Administered 2017-08-11 – 2017-08-14 (×6): 3 mL via INTRAVENOUS

## 2017-08-11 MED ORDER — POTASSIUM CHLORIDE IN NACL 40-0.9 MEQ/L-% IV SOLN
INTRAVENOUS | Status: DC
  Administered 2017-08-11: 25 mL/h via INTRAVENOUS
  Filled 2017-08-11: qty 1000

## 2017-08-11 MED ORDER — SODIUM CHLORIDE 0.9% FLUSH: 3.0000 mL | INTRAVENOUS | Status: DC | PRN

## 2017-08-11 NOTE — Progress Notes (Signed)
Pharmacy Antibiotic Note  [name] is a 67 y.o. female admitted on 08/11/2017 with cellulitis of lower legs, bilateral. Hx Cephalexin in January. Pharmacy has been consulted for Vancomycin dosing.  Afebrile, WBC 8.8. Blood cultures sent.  Plan:  Vancomycin 1gm IV x 1 tonight as ordered.  Vancomycin 500 mg IV q24hrs to begin on 2/2 pm.  Follow renal function, culture data, clinical progress.  Target Vanc troughs 10-15 mcg/ml.  Vanc trough prn.  Height: 5\' 4"  (162.6 cm) Weight: 147 lb (66.7 kg) IBW/kg (Calculated) : 54.7  Temp (24hrs), Avg:97.7 F (36.5 C), Min:97.7 F (36.5 C), Max:97.7 F (36.5 C)  Recent Labs  Lab 08/11/17 1741  WBC 8.8  CREATININE 1.78*    Estimated Creatinine Clearance: 29.6 mL/min (A) (by C-G formula based on SCr of 1.78 mg/dL (H)).    Allergies  Allergen Reactions  . Bidil [Isosorb Dinitrate-Hydralazine] Itching and Other (See Comments)    Jittery, feels like something is crawling    Antimicrobials this admission:  Vancomycin 2/1>>  Dose adjustments this admission:  n/a  Microbiology results:  2/1 blood x 2 -  Thank you for allowing pharmacy to be a part of this patient's care.  Dennie Fettersgan, Konstantina Nachreiner Donovan, [place] Pager: [phone] 08/11/2017 8:50 PM

## 2017-08-11 NOTE — Progress Notes (Signed)
Verbal orders from Dr. Algie CofferKadakia for K+2.7 Continuous IV NS with 40KCL at 25cc per hour and KCL 20meq x2 po now then repeat in one hour. Orders placed.

## 2017-08-11 NOTE — Progress Notes (Signed)
Critical potassium 2.7, Severe pain BLE, patient had oxy 5mg  two hours ago but no relief.   paged internal medicine

## 2017-08-11 NOTE — H&P (Signed)
Referring Physician:  [name] is an 67 y.o. female.                       Chief Complaint: Discharge form lower legs, bilateral  HPI: 67 year old female has discharge from both lower leg wounds. She has reactivation of old wounds with leg edema. She denies shortness of breath or fever or chills. She admits to severe pain in both lower legs x 1 month. She tried lower than normal/prescribed dose of antibiotics prescribed last month by ER doctor and she has not finished them yet. She has h/o biventricular heart failure.  Past Medical History:  Diagnosis Date  . Biventricular heart failure ([facility])    systolic/notes 07/25/2016  . CHF (congestive heart failure) ([facility])   . Coronary artery disease   . GERD (gastroesophageal reflux disease)   . Gout   . High cholesterol   . Hypertension   . Shortness of breath dyspnea   . Stomach ulcer   . Type II diabetes mellitus ([facility])       Past Surgical History:  Procedure Laterality Date  . CARDIAC CATHETERIZATION  2014  . CORONARY ANGIOPLASTY WITH STENT PLACEMENT  2013  . LEFT AND RIGHT HEART CATHETERIZATION WITH CORONARY ANGIOGRAM N/A 11/25/2011   Procedure: LEFT AND RIGHT HEART CATHETERIZATION WITH CORONARY ANGIOGRAM;  Surgeon: Robynn Pane, MD;  Location: [facility] CATH LAB;  Service: Cardiovascular;  Laterality: N/A;  . LEFT AND RIGHT HEART CATHETERIZATION WITH CORONARY ANGIOGRAM N/A 09/07/2012   Procedure: LEFT AND RIGHT HEART CATHETERIZATION WITH CORONARY ANGIOGRAM;  Surgeon: [name], MD;  Location: [facility] CATH LAB;  Service: Cardiovascular;  Laterality: N/A;  . PERCUTANEOUS CORONARY STENT INTERVENTION (PCI-S) Right 11/25/2011   Procedure: PERCUTANEOUS CORONARY STENT INTERVENTION (PCI-S);  Surgeon: Robynn Pane, MD;  Location: [facility] CATH LAB;  Service: Cardiovascular;  Laterality: Right;  . RIGHT/LEFT HEART CATH AND CORONARY ANGIOGRAPHY N/A 09/30/2016   Procedure: Right/Left Heart Cath and Coronary Angiography;  Surgeon: [name], MD;   Location: [facility] INVASIVE CV LAB;  Service: Cardiovascular;  Laterality: N/A;  . TONSILLECTOMY  1950's    No family history on file. Social History:  reports that she has quit smoking. Her smoking use included cigarettes. She has a 10.00 pack-year smoking history. she has never used smokeless tobacco. She reports that she does not drink alcohol or use drugs.  Allergies:  Allergies  Allergen Reactions  . Bidil [Isosorb Dinitrate-Hydralazine] Itching and Other (See Comments)    Jittery, feels like something is crawling    Medications Prior to Admission  Medication Sig Dispense Refill  . hydroxypropyl methylcellulose / hypromellose (GONIOVISC) 2.5 % ophthalmic solution Place 1 drop into both eyes daily.    Marland Kitchen acetaminophen (TYLENOL) 500 MG tablet Take 500 mg by mouth every 6 (six) hours as needed for moderate pain or headache.    . albuterol (PROVENTIL HFA;VENTOLIN HFA) 108 (90 Base) MCG/ACT inhaler Inhale 2 puffs into the lungs every 6 (six) hours as needed for wheezing or shortness of breath.    . allopurinol (ZYLOPRIM) 100 MG tablet Take 1 tablet (100 mg total) by mouth daily.    Marland Kitchen aspirin EC 81 MG tablet Take 81 mg by mouth daily.    . carvedilol (COREG) 6.25 MG tablet Take 1 tablet (6.25 mg total) by mouth 2 (two) times daily with a meal. 60 tablet 3  . cephALEXin (KEFLEX) 500 MG capsule Take 1 capsule (500 mg total) by mouth every 12 (  twelve) hours. 20 capsule 1  . collagenase (SANTYL) ointment Apply 1 application topically daily.    . cycloSPORINE (RESTASIS) 0.05 % ophthalmic emulsion Place 1 drop into both eyes daily.    . furosemide (LASIX) 80 MG tablet Take 1 tablet (80 mg total) by mouth 3 (three) times daily.    . magnesium oxide (MAG-OX) 400 (241.3 Mg) MG tablet Take 1 tablet (400 mg total) by mouth daily. 30 tablet 3  . metolazone (ZAROXOLYN) 5 MG tablet Take 1 tablet (5 mg total) by mouth every Monday, Wednesday, and Friday. One hour before AM furosemide - generic Lasix 60 tablet 3   . nitroGLYCERIN (NITROSTAT) 0.4 MG SL tablet Place 0.4 mg under the tongue every 5 (five) minutes as needed for chest pain.    . potassium chloride (K-DUR,KLOR-CON) 10 MEQ tablet Take 1 tablet (10 mEq total) by mouth daily.    . silver sulfADIAZINE (SILVADENE) 1 % cream Apply topically 2 (two) times daily. 400 g 0  . traMADol (ULTRAM) 50 MG tablet Take 1 tablet (50 mg total) by mouth 2 (two) times daily as needed. 30 tablet 0    No results found for this or any previous visit (from the past 48 hour(s)). No results found.  Review Of Systems Constitutional: No fever, chills, Small weight gain. Eyes: No vision change, wears glasses. No discharge or pain. Ears: No hearing loss, No tinnitus. Respiratory: No asthma, COPD, pneumonias. Positive shortness of breath. No hemoptysis. Cardiovascular: Positive chest pain, palpitation, leg edema. Gastrointestinal: Positive nausea, no vomiting, diarrhea, positive constipation. No GI bleed. No hepatitis. Genitourinary: No dysuria, hematuria, kidney stone. No incontinance. Neurological: No headache, stroke, seizures.  Psychiatry: No psych facility admission for anxiety, depression, suicide. No detox. Skin: No rash. Musculoskeletal: Positive joint pain, fibromyalgia. No neck pain, back pain. Lymphadenopathy: No lymphadenopathy. Hematology: No anemia or easy bruising.   Blood pressure (!) 161/85, pulse 78, temperature 97.7 F (36.5 C), temperature source Oral, resp. rate 18, height 5\' 4"  (1.626 m), weight 66.7 kg (147 lb), SpO2 99 %. Body mass index is 25.23 kg/m. General appearance: alert, cooperative, appears stated age and no distress Head: Normocephalic, atraumatic. Eyes: Brown/Blue/Hazel eyes, pink conjunctiva, corneas clear. PERRL, EOM's intact. Neck: No adenopathy, no carotid bruit, no JVD, supple, symmetrical, trachea midline and thyroid not enlarged. Resp: Clear to auscultation bilaterally. Cardio: Regular rate and rhythm, S1, S2 normal,  II/VI systolic murmur, no click, rub or gallop GI: Soft, non-tender; bowel sounds normal; no organomegaly. Extremities: 1 + edema with larger wounds on left leg and smaller ones on right leg, no cyanosis or clubbing. Venous stasis discoloration of both lower legs. Skin: Warm and dry.  Neurologic: Alert and oriented X 3, normal strength. Normal coordination and slow gait with cane use.  Assessment/Plan Cellulitis or lower legs, bilateral Chronic biventricular heart failure Hypertension CAD S/P LAD stent Type 2 DM Moderate protein calorie malnutrition Moderate MR Severe TR CKD, 3  Admit IV vancomycin Home medications Wound care consult.   Ricki RodriguezAjay S Boby Eyer, MD  08/11/2017, 5:25 PM

## 2017-08-12 LAB — CBC
HEMATOCRIT: 41 % (ref 36.0–46.0)
Hemoglobin: 13.3 g/dL (ref 12.0–15.0)
MCH: 26.7 pg (ref 26.0–34.0)
MCHC: 32.4 g/dL (ref 30.0–36.0)
MCV: 82.2 fL (ref 78.0–100.0)
PLATELETS: 193 10*3/uL (ref 150–400)
RBC: 4.99 MIL/uL (ref 3.87–5.11)
RDW: 18.1 % — AB (ref 11.5–15.5)
WBC: 8.3 10*3/uL (ref 4.0–10.5)

## 2017-08-12 LAB — BASIC METABOLIC PANEL
Anion gap: 12 (ref 5–15)
BUN: 60 mg/dL — AB (ref 6–20)
CHLORIDE: 103 mmol/L (ref 101–111)
CO2: 26 mmol/L (ref 22–32)
CREATININE: 1.95 mg/dL — AB (ref 0.44–1.00)
Calcium: 8.7 mg/dL — ABNORMAL LOW (ref 8.9–10.3)
GFR calc Af Amer: 30 mL/min — ABNORMAL LOW (ref >60)
GFR calc non Af Amer: 26 mL/min — ABNORMAL LOW (ref >60)
Glucose, Bld: 118 mg/dL — ABNORMAL HIGH (ref 65–99)
POTASSIUM: 3.9 mmol/L (ref 3.5–5.1)
Sodium: 141 mmol/L (ref 135–145)

## 2017-08-12 MED ORDER — FUROSEMIDE 10 MG/ML IJ SOLN
40.0000 mg | Freq: Every day | INTRAMUSCULAR | Status: DC
  Administered 2017-08-13: 40 mg via INTRAVENOUS
  Filled 2017-08-12: qty 4

## 2017-08-12 NOTE — Plan of Care (Signed)
  Progressing Clinical Measurements: Ability to maintain clinical measurements within normal limits will improve 08/12/2017 0133 - Progressing by Burtis Junesrewery, Chazlyn Cude, RN Diagnostic test results will improve 08/12/2017 0133 - Progressing by Burtis Junesrewery, Rohil Lesch, RN Pain Managment: General experience of comfort will improve 08/12/2017 0133 - Progressing by Burtis Junesrewery, Lashaye Fisk, RN   Started on Fluids with potassium and po potassium supplement for K+2.7. And Recheck in am.   Continues to complain of pain in BLE and wanted to have Santyl applied in early morning instead of night time.

## 2017-08-12 NOTE — Progress Notes (Signed)
Subjective:  Complains of bilateral leg pain and swelling left more than right. Denies any chest pain or shortness of breath. States used to go to wound Center for her chronic leg ulcer without improvement for last 1 month  Objective:  Vital Signs in the last 24 hours: Temp:  [97.7 F (36.5 C)-98.5 F (36.9 C)] 98.5 F (36.9 C) (02/02 0609) Pulse Rate:  [77-80] 77 (02/02 0609) Resp:  [18-20] 20 (02/02 0609) BP: (137-161)/(72-85) 137/74 (02/02 0609) SpO2:  [93 %-99 %] 93 % (02/02 0609) Weight:  [66.7 kg (147 lb)] 66.7 kg (147 lb) (02/01 1659)  Intake/Output from previous day: 02/01 0701 - 02/02 0700 In: 250 [P.O.:250] Out: 550 [Urine:550] Intake/Output from this shift: No intake/output data recorded.  Physical Exam: Neck: no adenopathy, no carotid bruit, no JVD and supple, symmetrical, trachea midline Lungs: clear to auscultation bilaterally Heart: regular rate and rhythm, S1, S2 normal and 2/6 systolic murmur noted Abdomen: soft, non-tender; bowel sounds normal; no masses,  no organomegaly Extremities: No clubbing cyanosis 2+ edema noted with chronic 4 x 4 nonhealing ulcer left leg lateral aspect and small ulcers right leg with minimal erythema  Lab Results: Recent Labs    08/11/17 1741 08/12/17 0656  WBC 8.8 8.3  HGB 15.1* 13.3  PLT 197 193   Recent Labs    08/11/17 1741 08/12/17 0656  NA 141 141  K 2.7* 3.9  CL 97* 103  CO2 27 26  GLUCOSE 151* 118*  BUN 56* 60*  CREATININE 1.78* 1.95*   No results for input(s): TROPONINI in the last 72 hours.  Invalid input(s): CK, MB Hepatic Function Panel Recent Labs    08/11/17 1741  PROT 8.2*  ALBUMIN 3.4*  AST 18  ALT 10*  ALKPHOS 102  BILITOT 3.2*   No results for input(s): CHOL in the last 72 hours. No results for input(s): PROTIME in the last 72 hours.  Imaging: Imaging results have been reviewed and No results found.  Cardiac Studies:  Assessment/Plan:  Cellulitis or lower legs, bilateral with  chronic ulcers Chronic biventricular heart failure Hypertension CAD S/P LAD stent Type 2 DM Moderate protein calorie malnutrition Moderate MR Severe TR CKD, 3 Plan Continue present management Continue dressing twice daily Elevate legs 30 while in bed Check labs in a.m.    LOS: 1 day    Rinaldo CloudHarwani, Kaylee Trivett 08/12/2017, 9:38 AM

## 2017-08-13 LAB — BASIC METABOLIC PANEL
Anion gap: 14 (ref 5–15)
BUN: 67 mg/dL — AB (ref 6–20)
CALCIUM: 9.1 mg/dL (ref 8.9–10.3)
CO2: 23 mmol/L (ref 22–32)
Chloride: 101 mmol/L (ref 101–111)
Creatinine, Ser: 2.73 mg/dL — ABNORMAL HIGH (ref 0.44–1.00)
GFR calc Af Amer: 20 mL/min — ABNORMAL LOW (ref >60)
GFR, EST NON AFRICAN AMERICAN: 17 mL/min — AB (ref >60)
GLUCOSE: 97 mg/dL (ref 65–99)
Potassium: 4.6 mmol/L (ref 3.5–5.1)
Sodium: 138 mmol/L (ref 135–145)

## 2017-08-13 LAB — MAGNESIUM: Magnesium: 2 mg/dL (ref 1.7–2.4)

## 2017-08-13 MED ORDER — VANCOMYCIN HCL IN DEXTROSE 1-5 GM/200ML-% IV SOLN: 1000.0000 mg | INTRAVENOUS | Status: DC

## 2017-08-13 NOTE — Progress Notes (Signed)
Pharmacy Antibiotic Note  Tanya SpikeMargaret A [name] is a 67 y.o. female admitted on 08/11/2017 with cellulitis of lower legs, bilateral. Hx Cephalexin in January. Pharmacy has been consulted for Vancomycin dosing.  Afebrile, WBC 8.8.   Blood cultures negative to date Scr up to 2.73 (furosemide stopped)  Plan: Change Vancomycin to 1 gram iv Q 48 hours Continue to follow  Height: 5\' 4"  (162.6 cm) Weight: 162 lb 4.1 oz (73.6 kg) IBW/kg (Calculated) : 54.7  Temp (24hrs), Avg:98.2 F (36.8 C), Min:97.5 F (36.4 C), Max:98.8 F (37.1 C)  Recent Labs  Lab 08/11/17 1741 08/12/17 0656 08/13/17 0753  WBC 8.8 8.3  --   CREATININE 1.78* 1.95* 2.73*    Estimated Creatinine Clearance: 20.2 mL/min (A) (by C-G formula based on SCr of 2.73 mg/dL (H)).    Allergies  Allergen Reactions  . Bidil [Isosorb Dinitrate-Hydralazine] Itching and Other (See Comments)    Jittery, feels like something is crawling    Antimicrobials this admission:  Vancomycin 2/1>>   Microbiology results:  2/1 blood x 2 - NGTD  Thank you for allowing pharmacy to be a part of this patient's care. Okey RegalLisa Asma Boldon, PharmD [phone] 08/13/2017 12:43 PM

## 2017-08-13 NOTE — Consult Note (Signed)
WOC Nurse wound consult note Reason for Consult: Bilateral full thickness wounds, chronic, nonhealing (full thickness). Previously seen in the outpatient Wound Care Center for these ulcerations; has not been in past month. Wound type: Consider mixed etiology, venous and arterial component as punctate appearance of wounds is more consistent with arterial insufficiency, edema more consistent with venous insufficiency. Pressure Injury POA: N/A Measurement:  LLE:  Anterior:  6cm x 5cm with 100% of wound bed obscured by the presence of necrotic tissue LLE: medial:  2cm x 3cm with 100% of wound bed obscured by the presence of necrotic tissue RLE: medial:  2cm x 1.6cm with 50% red, 50% yellow slough in wound bed. Wound bed:As described above Drainage (amount, consistency, odor) small amount of light yellow exudate consistent with autolytically debriding necrotic tissue Periwound: LLE is edematous, darker hued discoloration at toes (2nd and 3rd digit) in conjunction with edema is making it painful and difficult to ambulate as reported by patient. Dressing procedure/placement/frequency: I will continue the POC initially implemented by Dr. Algie CofferKadakia using daily collagenase [facility](Santyl) to enzymatically debride some of the necrotic tissue. Suggest vascular consultation for further input on LEs with no evidence of thrombus. If you agree, please order/arrange consult.  WOC nursing team will not follow, but will remain available to this patient, the nursing and medical teams.  Please re-consult if needed. Thanks, Ladona MowLaurie Lisabeth Mian, MSN, RN, GNP, Hans EdenCWOCN, CWON-AP, FAAN  Pager# [phone]

## 2017-08-13 NOTE — Progress Notes (Signed)
Subjective:  Leg  pain and swelling improved renal function progressively deteriorating. Denies any chest pain or shortness of breath.  Objective:  Vital Signs in the last 24 hours: Temp:  [97.5 F (36.4 C)-98.8 F (37.1 C)] 98.2 F (36.8 C) (02/03 0512) Pulse Rate:  [74-78] 78 (02/03 0512) Resp:  [16-20] 16 (02/03 0512) BP: (124-131)/(67-78) 131/78 (02/03 0512) SpO2:  [96 %] 96 % (02/03 0512) Weight:  [73.6 kg (162 lb 4.1 oz)] 73.6 kg (162 lb 4.1 oz) (02/03 0512)  Intake/Output from previous day: 02/02 0701 - 02/03 0700 In: 1181.3 [P.O.:240; I.V.:841.3; IV Piggyback:100] Out: -  Intake/Output from this shift: No intake/output data recorded.  Physical Exam: Neck: no adenopathy, no carotid bruit, no JVD and supple, symmetrical, trachea midline Lungs: clear to auscultation bilaterally Heart: regular rate and rhythm, S1, S2 normal and 2/6 systolic murmur noted Abdomen: soft, non-tender; bowel sounds normal; no masses,  no organomegaly Extremities: No clubbing cyanosis 1+ edema noted with chronic nonhealing ulcer of leg and small ulcer right leg with minimal erythema  Lab Results: Recent Labs    08/11/17 1741 08/12/17 0656  WBC 8.8 8.3  HGB 15.1* 13.3  PLT 197 193   Recent Labs    08/12/17 0656 08/13/17 0753  NA 141 138  K 3.9 4.6  CL 103 101  CO2 26 23  GLUCOSE 118* 97  BUN 60* 67*  CREATININE 1.95* 2.73*   No results for input(s): TROPONINI in the last 72 hours.  Invalid input(s): CK, MB Hepatic Function Panel Recent Labs    08/11/17 1741  PROT 8.2*  ALBUMIN 3.4*  AST 18  ALT 10*  ALKPHOS 102  BILITOT 3.2*   No results for input(s): CHOL in the last 72 hours. No results for input(s): PROTIME in the last 72 hours.  Imaging: Imaging results have been reviewed and No results found.  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   LOS: 2 days    Rinaldo CloudHarwani, Shakil Dirk 08/13/2017, 11:19 AM

## 2017-08-14 LAB — CBC
HEMATOCRIT: 37.2 % (ref 36.0–46.0)
Hemoglobin: 12.7 g/dL (ref 12.0–15.0)
MCH: 27.7 pg (ref 26.0–34.0)
MCHC: 34.1 g/dL (ref 30.0–36.0)
MCV: 81 fL (ref 78.0–100.0)
PLATELETS: 216 10*3/uL (ref 150–400)
RBC: 4.59 MIL/uL (ref 3.87–5.11)
RDW: 17.9 % — AB (ref 11.5–15.5)
WBC: 6.3 10*3/uL (ref 4.0–10.5)

## 2017-08-14 LAB — BASIC METABOLIC PANEL
ANION GAP: 12 (ref 5–15)
BUN: 77 mg/dL — ABNORMAL HIGH (ref 6–20)
CALCIUM: 9 mg/dL (ref 8.9–10.3)
CO2: 23 mmol/L (ref 22–32)
CREATININE: 2.83 mg/dL — AB (ref 0.44–1.00)
Chloride: 100 mmol/L — ABNORMAL LOW (ref 101–111)
GFR, EST AFRICAN AMERICAN: 19 mL/min — AB (ref >60)
GFR, EST NON AFRICAN AMERICAN: 16 mL/min — AB (ref >60)
Glucose, Bld: 113 mg/dL — ABNORMAL HIGH (ref 65–99)
Potassium: 4.8 mmol/L (ref 3.5–5.1)
SODIUM: 135 mmol/L (ref 135–145)

## 2017-08-14 LAB — BRAIN NATRIURETIC PEPTIDE: B Natriuretic Peptide: 2371 pg/mL — ABNORMAL HIGH (ref 0.0–100.0)

## 2017-08-14 MED ORDER — DEXTROSE 5 % IV SOLN
1.0000 g | INTRAVENOUS | Status: DC
  Administered 2017-08-14 – 2017-08-15 (×2): 1 g via INTRAVENOUS
  Filled 2017-08-14 (×4): qty 10

## 2017-08-14 MED ORDER — DOPAMINE-DEXTROSE 3.2-5 MG/ML-% IV SOLN
5.0000 ug/kg/min | INTRAVENOUS | Status: DC
  Administered 2017-08-14: 5 ug/kg/min via INTRAVENOUS
  Filled 2017-08-14: qty 250

## 2017-08-14 NOTE — Progress Notes (Signed)
Ref: [name], Tanya Amaral, MD   Subjective:  Leg edema and pain increasing. Afebrile.   Objective:  Vital Signs in the last 24 hours: Temp:  [98.2 F (36.8 C)-98.4 F (36.9 C)] 98.2 F (36.8 C) (02/04 0541) Pulse Rate:  [68-72] 68 (02/04 0541) Cardiac Rhythm: Normal sinus rhythm;Bundle branch block (02/03 2045) Resp:  [17-18] 18 (02/04 0541) BP: (125-128)/(59-76) 126/63 (02/04 0541) SpO2:  [96 %-98 %] 98 % (02/04 0541) Weight:  [72.6 kg (160 lb 0.9 oz)] 72.6 kg (160 lb 0.9 oz) (02/04 0500)  Physical Exam: BP Readings from Last 1 Encounters:  08/14/17 126/63     Wt Readings from Last 1 Encounters:  08/14/17 72.6 kg (160 lb 0.9 oz)    Weight change: -1 kg (-3.3 oz) Body mass index is 27.47 kg/m. HEENT: [place]/AT, Eyes-Brown, PERL, EOMI, Conjunctiva-Pink, Sclera-Non-icteric Neck: + JVD, No bruit, Trachea midline. Lungs:  Basal crackles, Bilateral. Cardiac:  Regular rhythm, normal S1 and S2, no S3. II/VI systolic murmur. Abdomen:  Soft, non-tender. BS present. Extremities:  2 + edema and lower leg dressing present. No cyanosis. No clubbing. CNS: AxOx3, Cranial nerves grossly intact, moves all 4 extremities.  Skin: Warm and dry.   Intake/Output from previous day: 02/03 0701 - 02/04 0700 In: 120 [P.O.:120] Out: -     Lab Results: BMET    Component Value Date/Time   NA 135 08/14/2017 0540   NA 138 08/13/2017 0753   NA 141 08/12/2017 0656   K 4.8 08/14/2017 0540   K 4.6 08/13/2017 0753   K 3.9 08/12/2017 0656   CL 100 (L) 08/14/2017 0540   CL 101 08/13/2017 0753   CL 103 08/12/2017 0656   CO2 23 08/14/2017 0540   CO2 23 08/13/2017 0753   CO2 26 08/12/2017 0656   GLUCOSE 113 (H) 08/14/2017 0540   GLUCOSE 97 08/13/2017 0753   GLUCOSE 118 (H) 08/12/2017 0656   BUN 77 (H) 08/14/2017 0540   BUN 67 (H) 08/13/2017 0753   BUN 60 (H) 08/12/2017 0656   CREATININE 2.83 (H) 08/14/2017 0540   CREATININE 2.73 (H) 08/13/2017 0753   CREATININE 1.95 (H) 08/12/2017 0656   CALCIUM 9.0  08/14/2017 0540   CALCIUM 9.1 08/13/2017 0753   CALCIUM 8.7 (L) 08/12/2017 0656   GFRNONAA 16 (L) 08/14/2017 0540   GFRNONAA 17 (L) 08/13/2017 0753   GFRNONAA 26 (L) 08/12/2017 0656   GFRAA 19 (L) 08/14/2017 0540   GFRAA 20 (L) 08/13/2017 0753   GFRAA 30 (L) 08/12/2017 0656   CBC    Component Value Date/Time   WBC 6.3 08/14/2017 0540   RBC 4.59 08/14/2017 0540   HGB 12.7 08/14/2017 0540   HCT 37.2 08/14/2017 0540   PLT 216 08/14/2017 0540   MCV 81.0 08/14/2017 0540   MCH 27.7 08/14/2017 0540   MCHC 34.1 08/14/2017 0540   RDW 17.9 (H) 08/14/2017 0540   LYMPHSABS 1.0 08/11/2017 1741   MONOABS 0.6 08/11/2017 1741   EOSABS 0.0 08/11/2017 1741   BASOSABS 0.0 08/11/2017 1741   HEPATIC Function Panel Recent Labs    05/28/17 0518 07/26/17 1326 08/11/17 1741  PROT 6.7 7.9 8.2*   HEMOGLOBIN A1C No components found for: HGA1C,  MPG CARDIAC ENZYMES Lab Results  Component Value Date   CKTOTAL 59 09/07/2012   CKMB 3.2 09/07/2012   TROPONINI 0.07 (HH) 05/22/2017   TROPONINI 0.07 (HH) 05/21/2017   TROPONINI 0.08 (HH) 05/21/2017   BNP No results for input(s): PROBNP in the last 8760 hours. TSH  No results for input(s): TSH in the last 8760 hours. CHOLESTEROL No results for input(s): CHOL in the last 8760 hours.  Scheduled Meds: . allopurinol  100 mg Oral Daily  . carvedilol  6.25 mg Oral BID WC  . collagenase  1 application Topical Daily  . cycloSPORINE  1 drop Both Eyes Daily  . heparin  5,000 Units Subcutaneous Q8H  . metolazone  5 mg Oral Once per day on Mon Wed Fri  . polyvinyl alcohol  1 drop Both Eyes Daily  . potassium chloride  10 mEq Oral Daily  . sodium chloride flush  3 mL Intravenous Q12H   Continuous Infusions: . sodium chloride    . DOPamine    . 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  Start dopamine drip to improve leg edema and thus improve leg wounds.   LOS: 3 days    [name]  MD  08/14/2017, 10:02 AM

## 2017-08-15 MED ORDER — FUROSEMIDE 10 MG/ML IJ SOLN
80.0000 mg | Freq: Once | INTRAMUSCULAR | Status: AC
Start: 2017-08-15 — End: 2017-08-15
  Administered 2017-08-15: 80 mg via INTRAVENOUS
  Filled 2017-08-15: qty 8

## 2017-08-15 MED ORDER — DOPAMINE-DEXTROSE 3.2-5 MG/ML-% IV SOLN
5.0000 ug/kg/min | INTRAVENOUS | Status: DC
  Administered 2017-08-15 – 2017-08-16 (×2): 5 ug/kg/min via INTRAVENOUS
  Filled 2017-08-15: qty 250

## 2017-08-15 NOTE — Progress Notes (Signed)
Ref: [name], MD   Subjective:  Larey Seat yesterday with left knee pain. Now on dopamine drip at 5 mcg/kg/min.  Objective:  Vital Signs in the last 24 hours: Temp:  [98.3 F (36.8 C)-98.8 F (37.1 C)] 98.8 F (37.1 C) (02/05 0411) Pulse Rate:  [72-79] 79 (02/05 0411) Cardiac Rhythm: Normal sinus rhythm;Bundle branch block (02/04 2157) Resp:  [18-20] 18 (02/05 0411) BP: (142-156)/(69-73) 142/73 (02/05 0411) SpO2:  [92 %-93 %] 92 % (02/05 0136) Weight:  [73.6 kg (162 lb 3.2 oz)] 73.6 kg (162 lb 3.2 oz) (02/05 0500)  Physical Exam: BP Readings from Last 1 Encounters:  08/15/17 (!) 142/73     Wt Readings from Last 1 Encounters:  08/15/17 73.6 kg (162 lb 3.2 oz)    Weight change: 0.974 kg (2 lb 2.3 oz) Body mass index is 27.84 kg/m. HEENT: [place]/AT, Eyes-Brown, PERL, EOMI, Conjunctiva-Pink, Sclera-Non-icteric Neck: Positive JVD, No bruit, Trachea midline. Lungs:  Basal crackles, Bilateral. Cardiac:  Regular rhythm, normal S1 and S2, no S3. II/VI systolic murmur. Abdomen:  Soft, non-tender. BS present. Extremities:  2 + edema with lower leg dressing. No cyanosis. No clubbing. CNS: AxOx3, Cranial nerves grossly intact, moves all 4 extremities.  Skin: Warm and dry.   Intake/Output from previous day: 02/04 0701 - 02/05 0700 In: 543.1 [P.O.:360; I.V.:133.1; IV Piggyback:50] Out: 700 [Urine:700]    Lab Results: BMET    Component Value Date/Time   NA 135 08/14/2017 0540   NA 138 08/13/2017 0753   NA 141 08/12/2017 0656   K 4.8 08/14/2017 0540   K 4.6 08/13/2017 0753   K 3.9 08/12/2017 0656   CL 100 (L) 08/14/2017 0540   CL 101 08/13/2017 0753   CL 103 08/12/2017 0656   CO2 23 08/14/2017 0540   CO2 23 08/13/2017 0753   CO2 26 08/12/2017 0656   GLUCOSE 113 (H) 08/14/2017 0540   GLUCOSE 97 08/13/2017 0753   GLUCOSE 118 (H) 08/12/2017 0656   BUN 77 (H) 08/14/2017 0540   BUN 67 (H) 08/13/2017 0753   BUN 60 (H) 08/12/2017 0656   CREATININE 2.83 (H) 08/14/2017 0540   CREATININE 2.73 (H) 08/13/2017 0753   CREATININE 1.95 (H) 08/12/2017 0656   CALCIUM 9.0 08/14/2017 0540   CALCIUM 9.1 08/13/2017 0753   CALCIUM 8.7 (L) 08/12/2017 0656   GFRNONAA 16 (L) 08/14/2017 0540   GFRNONAA 17 (L) 08/13/2017 0753   GFRNONAA 26 (L) 08/12/2017 0656   GFRAA 19 (L) 08/14/2017 0540   GFRAA 20 (L) 08/13/2017 0753   GFRAA 30 (L) 08/12/2017 0656   CBC    Component Value Date/Time   WBC 6.3 08/14/2017 0540   RBC 4.59 08/14/2017 0540   HGB 12.7 08/14/2017 0540   HCT 37.2 08/14/2017 0540   PLT 216 08/14/2017 0540   MCV 81.0 08/14/2017 0540   MCH 27.7 08/14/2017 0540   MCHC 34.1 08/14/2017 0540   RDW 17.9 (H) 08/14/2017 0540   LYMPHSABS 1.0 08/11/2017 1741   MONOABS 0.6 08/11/2017 1741   EOSABS 0.0 08/11/2017 1741   BASOSABS 0.0 08/11/2017 1741   HEPATIC Function Panel Recent Labs    05/28/17 0518 07/26/17 1326 08/11/17 1741  PROT 6.7 7.9 8.2*   HEMOGLOBIN A1C No components found for: HGA1C,  MPG CARDIAC ENZYMES Lab Results  Component Value Date   CKTOTAL 59 09/07/2012   CKMB 3.2 09/07/2012   TROPONINI 0.07 (HH) 05/22/2017   TROPONINI 0.07 (HH) 05/21/2017   TROPONINI 0.08 (HH) 05/21/2017   BNP No  results for input(s): PROBNP in the last 8760 hours. TSH No results for input(s): TSH in the last 8760 hours. CHOLESTEROL No results for input(s): CHOL in the last 8760 hours.  Scheduled Meds: . allopurinol  100 mg Oral Daily  . carvedilol  6.25 mg Oral BID WC  . collagenase  1 application Topical Daily  . cycloSPORINE  1 drop Both Eyes Daily  . furosemide  80 mg Intravenous Once  . heparin  5,000 Units Subcutaneous Q8H  . metolazone  5 mg Oral Once per day on Mon Wed Fri  . polyvinyl alcohol  1 drop Both Eyes Daily  . potassium chloride  10 mEq Oral Daily  . sodium chloride flush  3 mL Intravenous Q12H   Continuous Infusions: . sodium chloride    . cefTRIAXone (ROCEPHIN)  IV Stopped (08/14/17 2227)  . DOPamine     PRN Meds:.sodium chloride,  albuterol, oxyCODONE, sodium chloride flush  Assessment/Plan: Cellulitis of both lower legs with recurrent ulcers Acute on chronic biventricular heart failure Hypertension S/P LAD stent Type 2 DM Moderate protein calorie malnutrition Moderate MR Severe TR Acute on CKD, 3  Continue dopamine drip. IV lasix 80 mg. X 1. BMET in AM.   LOS: 4 days    Orpah CobbAjay Akemi Overholser  MD  08/15/2017, 9:29 AM

## 2017-08-15 NOTE — Progress Notes (Signed)
08/15/17 0136  What Happened  Was fall witnessed? No  Was patient injured? Unsure  Patient found on floor  Found by Staff-comment  Stated prior activity bathroom-unassisted  Follow Up  MD notified On call/floor coverage  Time MD notified 0210  Family notified Yes-comment  Time family notified 0216  Additional tests No  Simple treatment Other (comment) (assessed)  Progress note created (see row info) Yes  Adult Fall Risk Assessment  Risk Factor Category (scoring not indicated) Fall has occurred during this admission (document High fall risk)  Age 67  Fall History: Fall within 6 months prior to admission 5  Elimination; Bowel and/or Urine Incontinence 0  Elimination; Bowel and/or Urine Urgency/Frequency 0  Medications: includes PCA/Opiates, Anti-convulsants, Anti-hypertensives, Diuretics, Hypnotics, Laxatives, Sedatives, and Psychotropics 3  Patient Care Equipment 1  Mobility-Assistance 2  Mobility-Gait 2  Mobility-Sensory Deficit 0  Altered awareness of immediate physical environment 0  Impulsiveness 2  Lack of understanding of one's physical/cognitive limitations 4  Total Score 20  Patient's Fall Risk High Fall Risk (>13 points)  Adult Fall Risk Interventions  Required Bundle Interventions *See Row Information* High fall risk - low, moderate, and high requirements implemented  Additional Interventions Use of appropriate toileting equipment (bedpan, BSC, etc.)  Screening for Fall Injury Risk  Risk For Fall Injury- See Row Information  None identified  Vitals  Temp 98.5 F (36.9 C)  Temp Source Oral  BP (!) 150/69  MAP (mmHg) 89  BP Location Left Arm  BP Method Automatic  Patient Position (if appropriate) Sitting  Pulse Rate 72  Pulse Rate Source Monitor  Resp 20  Oxygen Therapy  SpO2 92 %  O2 Device Room Air  Pain Assessment  Pain Assessment No/denies pain  Pain Score 0  Neurological  Neuro (WDL) X  Level of Consciousness Alert  Orientation Level Oriented to  person;Oriented to place;Disoriented to time  Cognition Appropriate at baseline;Follows commands;Poor safety awareness  Speech Clear  Pupil Assessment  Yes  R Pupil Size (mm) 2  R Pupil Shape Round  R Pupil Reaction Brisk  L Pupil Size (mm) 2  L Pupil Shape Round  L Pupil Reaction Brisk  Additional Pupil Assessments Yes  R Extraocular Movement 4  L Extraocular Movement 4  Motor Function/Sensation Assessment Grip;Motor strength  R Hand Grip Present  L Hand Grip Present  RUE Motor Strength 5  LUE Motor Strength 5  RLE Motor Strength 4  LLE Motor Strength 4  Neuro Symptoms None  Neuro Additional Assessments No  Neuro Additional Assessments Yes  Glasgow Coma Scale  Eye Opening 4  Best Motor Response 6  Best Verbal Response (NON-intubated) 4  Glasgow Coma Scale Score 14  Musculoskeletal  Musculoskeletal (WDL) X  Assistive Device BSC  Generalized Weakness Yes  Weight Bearing Restrictions No  Musculoskeletal Details  RLE Weakness  LLE Weakness  Integumentary  Integumentary (WDL) X  Skin Color Appropriate for ethnicity  Skin Condition Dry  Skin Integrity Cellulitis;Other (Comment) (blisters)  Cellulitis Location Leg  Cellulitis Location Orientation Right;Left  Cellulitis Intervention Other (Comment) (dressing to site)  Skin Turgor Non-tenting

## 2017-08-15 NOTE — Care Management Important Message (Signed)
Important Message  Patient Details  Name: [name] MRN: [id number] Date of Birth: 11-19-1950   Medicare Important Message Given:  Yes    [name] 08/15/2017, 1:56 PM

## 2017-08-16 ENCOUNTER — Encounter (HOSPITAL_COMMUNITY): Payer: Self-pay | Admitting: General Practice

## 2017-08-16 LAB — BASIC METABOLIC PANEL
ANION GAP: 15 (ref 5–15)
BUN: 68 mg/dL — ABNORMAL HIGH (ref 6–20)
CALCIUM: 8.8 mg/dL — AB (ref 8.9–10.3)
CHLORIDE: 96 mmol/L — AB (ref 101–111)
CO2: 23 mmol/L (ref 22–32)
Creatinine, Ser: 1.83 mg/dL — ABNORMAL HIGH (ref 0.44–1.00)
GFR calc non Af Amer: 28 mL/min — ABNORMAL LOW (ref >60)
GFR, EST AFRICAN AMERICAN: 32 mL/min — AB (ref >60)
GLUCOSE: 120 mg/dL — AB (ref 65–99)
POTASSIUM: 4.6 mmol/L (ref 3.5–5.1)
Sodium: 134 mmol/L — ABNORMAL LOW (ref 135–145)

## 2017-08-16 LAB — CULTURE, BLOOD (ROUTINE X 2)
CULTURE: NO GROWTH
CULTURE: NO GROWTH
SPECIAL REQUESTS: ADEQUATE
Special Requests: ADEQUATE

## 2017-08-16 MED ORDER — FUROSEMIDE 10 MG/ML IJ SOLN
80.0000 mg | Freq: Once | INTRAMUSCULAR | Status: AC
  Administered 2017-08-16: 80 mg via INTRAVENOUS
  Filled 2017-08-16: qty 8

## 2017-08-16 NOTE — Consult Note (Signed)
Hospital Consult    Reason for Consult:  Pain/ulcerations BLE Requesting Physician:  Dr. Algie CofferKadakia MRN #:  [id number]  History of Present Illness: This is a 67 y.o. female with PMH significant for CHF, CAD, hyperlipidemia, HTN, type 2 diabetes mellitus, CKD stage III who has been admitted for cellulitis and ulcerations of bilateral lower extremities secondary to edema in the presence of congestive heart failure.  Vascular surgery has been consulted to evaluate ulcerations of bilateral lower extremities with edema and pain.  Per patient left leg and foot has been more painful to her over the past month 2 months.  She has had on and off ulcerations on her shins over several years.  She has pain to bilateral shins and ankles to light touch however denies a claudication history.  She is taking aspirin daily.  BNP during admission is 2,371.  She denies current tobacco use.  There has not been any arterial duplex or ABIs noted on patient's chart.  Past Medical History:  Diagnosis Date  . Biventricular heart failure (HCC)    systolic/notes 07/25/2016  . CHF (congestive heart failure) (HCC)   . Coronary artery disease   . GERD (gastroesophageal reflux disease)   . Gout   . High cholesterol   . Hypertension   . Shortness of breath dyspnea   . Stomach ulcer   . Type II diabetes mellitus (HCC)     Past Surgical History:  Procedure Laterality Date  . CARDIAC CATHETERIZATION  2014  . CORONARY ANGIOPLASTY WITH STENT PLACEMENT  2013  . LEFT AND RIGHT HEART CATHETERIZATION WITH CORONARY ANGIOGRAM N/A 11/25/2011   Procedure: LEFT AND RIGHT HEART CATHETERIZATION WITH CORONARY ANGIOGRAM;  Surgeon: Robynn PaneMohan N Harwani, MD;  Location: [facility] CATH [facility];  Service: Cardiovascular;  Laterality: N/A;  . LEFT AND RIGHT HEART CATHETERIZATION WITH CORONARY ANGIOGRAM N/A 09/07/2012   Procedure: LEFT AND RIGHT HEART CATHETERIZATION WITH CORONARY ANGIOGRAM;  Surgeon: Ricki RodriguezAjay S Kadakia, MD;  Location: [facility] CATH [facility];  Service:  Cardiovascular;  Laterality: N/A;  . PERCUTANEOUS CORONARY STENT INTERVENTION (PCI-S) Right 11/25/2011   Procedure: PERCUTANEOUS CORONARY STENT INTERVENTION (PCI-S);  Surgeon: Robynn PaneMohan N Harwani, MD;  [facility];  Service: Cardiovascular;  Laterality: Right;  . RIGHT/LEFT HEART CATH AND CORONARY ANGIOGRAPHY N/A 09/30/2016   Procedure: Right/Left Heart Cath and Coronary Angiography;  Surgeon: Orpah CobbAjay Kadakia, MD;  Location: [facility] INVASIVE CV LAB;  Service: Cardiovascular;  Laterality: N/A;  . TONSILLECTOMY  1950's    Allergies  Allergen Reactions  . Bidil [Isosorb Dinitrate-Hydralazine] Itching and Other (See Comments)    Jittery, feels like something is crawling    Prior to Admission medications   Medication Sig Start Date End Date Taking? Authorizing Provider  acetaminophen (TYLENOL) 500 MG tablet Take 500 mg by mouth every 6 (six) hours as needed for moderate pain or headache.   Yes [provider]  albuterol (PROVENTIL HFA;VENTOLIN HFA) 108 (90 Base) MCG/ACT inhaler Inhale 2 puffs into the lungs every 6 (six) hours as needed for wheezing or shortness of breath.   Yes [provider]  allopurinol (ZYLOPRIM) 100 MG tablet Take 1 tablet (100 mg total) by mouth daily. 08/03/16  Yes [name], Ajay, MD  aspirin EC 81 MG tablet Take 81 mg by mouth daily.   Yes [provider]  carvedilol (COREG) 6.25 MG tablet Take 1 tablet (6.25 mg total) by mouth 2 (two) times daily with a meal. 10/01/16  Yes [name], Ajay, MD  collagenase (SANTYL) ointment Apply 1 application topically  daily.   Yes [provider]  cycloSPORINE (RESTASIS) 0.05 % ophthalmic emulsion Place 1 drop into both eyes daily.   Yes [provider]  furosemide (LASIX) 80 MG tablet Take 1 tablet (80 mg total) by mouth 3 (three) times daily. Patient taking differently: Take 80 mg by mouth 2 (two) times daily.  07/26/17  Yes [name], MD  hydroxypropyl methylcellulose / hypromellose (GONIOVISC)  2.5 % ophthalmic solution Place 1 drop into both eyes daily.   Yes [provider]  metolazone (ZAROXOLYN) 5 MG tablet Take 1 tablet (5 mg total) by mouth every Monday, Wednesday, and Friday. One hour before AM furosemide - generic Lasix 03/17/17  Yes [name], MD  nitroGLYCERIN (NITROSTAT) 0.4 MG SL tablet Place 0.4 mg under the tongue every 5 (five) minutes as needed for chest pain. 09/10/12  Yes [name], MD  potassium chloride (K-DUR,KLOR-CON) 10 MEQ tablet Take 1 tablet (10 mEq total) by mouth daily. 08/03/16  Yes [name], MD  silver sulfADIAZINE (SILVADENE) 1 % cream Apply topically 2 (two) times daily. 07/26/17  Yes Georgiana Shore, PA-C    Social History   Socioeconomic History  . Marital status: Married    Spouse name: Not on file  . Number of children: Not on file  . Years of education: Not on file  . Highest education level: Not on file  Social Needs  . Financial resource strain: Not on file  . Food insecurity - worry: Not on file  . Food insecurity - inability: Not on file  . Transportation needs - medical: Not on file  . Transportation needs - non-medical: Not on file  Occupational History  . Not on file  Tobacco Use  . Smoking status: Former Smoker    Packs/day: 0.50    Years: 20.00    Pack years: 10.00    Types: Cigarettes  . Smokeless tobacco: Never Used  . Tobacco comment: quit smoking in the 80's  Substance and Sexual Activity  . Alcohol use: No  . Drug use: No  . Sexual activity: No  Other Topics Concern  . Not on file  Social History Narrative  . Not on file     No family history on file.  ROS: Otherwise negative unless mentioned in HPI  Physical Examination  Vitals:   08/16/17 0430 08/16/17 0911  BP: 135/72 (!) 148/71  Pulse: 72 79  Resp: 18   Temp: 98.1 F (36.7 C)   SpO2: 92%    Body mass index is 27.89 kg/m.  General:  WDWN in NAD Gait: Not observed HENT: WNL, normocephalic Pulmonary: normal non-labored  breathing, without Rales, rhonchi,  wheezing Cardiac: regular, Abdomen:  soft, NT/ND, no masses Skin: without rashes Vascular Exam/Pulses: No palpable pedal pulses likely secondary to edema however left foot with multiphasic three-vessel runoff; three-vessel runoff by Doppler right foot, right PT seems to be monophasic Extremities: With cellulitis and ulcerations of bilateral shins; shins ankles and feet very sensitive with extreme pain to light touch Musculoskeletal: no muscle wasting or atrophy  Neurologic: A&O X 3;  No focal weakness or paresthesias are detected; speech is fluent/normal Psychiatric:  The pt has Normal affect. Lymph:  Unremarkable  CBC    Component Value Date/Time   WBC 6.3 08/14/2017 0540   RBC 4.59 08/14/2017 0540   HGB 12.7 08/14/2017 0540   HCT 37.2 08/14/2017 0540   PLT 216 08/14/2017 0540   MCV 81.0 08/14/2017 0540   MCH 27.7 08/14/2017 0540  MCHC 34.1 08/14/2017 0540   RDW 17.9 (H) 08/14/2017 0540   LYMPHSABS 1.0 08/11/2017 1741   MONOABS 0.6 08/11/2017 1741   EOSABS 0.0 08/11/2017 1741   BASOSABS 0.0 08/11/2017 1741    BMET    Component Value Date/Time   NA 134 (L) 08/16/2017 0504   K 4.6 08/16/2017 0504   CL 96 (L) 08/16/2017 0504   CO2 23 08/16/2017 0504   GLUCOSE 120 (H) 08/16/2017 0504   BUN 68 (H) 08/16/2017 0504   CREATININE 1.83 (H) 08/16/2017 0504   CALCIUM 8.8 (L) 08/16/2017 0504   GFRNONAA 28 (L) 08/16/2017 0504   GFRAA 32 (L) 08/16/2017 0504    COAGS: Lab Results  Component Value Date   INR 1.14 09/30/2016   INR 1.34 08/12/2014   INR 1.43 06/19/2014     Non-Invasive Vascular Imaging:   BLE arterial duplex pending  Statin:  No. Beta Blocker:  Yes.   Aspirin:  Yes.   ACEI:  No. ARB:  No. CCB use:  No Other antiplatelets/anticoagulants:  No.    ASSESSMENT/PLAN: This is a 67 y.o. female with BLE ulcerations secondary to edema  Agree with IV antibiotics for cellulitis Pain in L >R leg likely due to edema and  ulcerations; unlikely ischemic pain given strength of doppler signals Patient will be unable to tolerate ABIs so we will check BLE arterial duplex CKD limits treatment options if hemodynamically significant stenosis is identified on duplex Continue current wound care Elevate legs with multiple pillows if able to tolerate On call surgeon Dr. Darrick Penna will evaluate the patient later today  Emilie Rutter PA-C [facility] [phone]   Agree with above.  Pt with 6 week history of ulcers calf bilaterally. She has not tolerated compression in the past due to pain.  She has multiple comorbidities including CHF severe enough that she is currently on a dopamine drip.  She is currently followed at the wound center and receives Santyl dressings.  She thinks the wounds have improved.  She states the swelling in her legs has improved with diuresis.  She is not a very good candidate for agram due to her underlying renal dysfunction.    Will get arterial duplex since she does not have palpable pulses.  Continue current wound care.  Will recheck after duplex with further recommendations.  Would apply ace wraps for compression if pt consents but due to pain most likely will not tolerate for now.  Etiology of ulcers is most likely secondary to skin breakdown from chronic edema and cellulitis  Fabienne Bruns, MD [facility] [place] Office: [phone] Pager: [phone]

## 2017-08-16 NOTE — Progress Notes (Signed)
Patient iv leaking and was removed.  IV team attempted to replace with ultrasound and was unsuccessful.  MD Algie CofferKadakia notified and stated IR can place tomorrow.   Dopamine drip will remain off until then.  Will continue to monitor.

## 2017-08-16 NOTE — Progress Notes (Signed)
Ref: Tanya CobbKadakia, Tanya Herbison, MD   Subjective:  Improving diuresis. Left leg pain persist. Afebrile.   Objective:  Vital Signs in the last 24 hours: Temp:  [98.1 F (36.7 C)-99 F (37.2 C)] 98.1 F (36.7 C) (02/06 0430) Pulse Rate:  [67-79] 79 (02/06 0911) Cardiac Rhythm: Normal sinus rhythm (02/05 1900) Resp:  [17-18] 18 (02/06 0430) BP: (135-148)/(65-78) 148/71 (02/06 0911) SpO2:  [92 %-98 %] 92 % (02/06 0430) Weight:  [73.7 kg (162 lb 8 oz)] 73.7 kg (162 lb 8 oz) (02/06 0430)  Physical Exam: BP Readings from Last 1 Encounters:  08/16/17 (!) 148/71     Wt Readings from Last 1 Encounters:  08/16/17 73.7 kg (162 lb 8 oz)    Weight change: 0.136 kg (4.8 oz) Body mass index is 27.89 kg/m. HEENT: [place]/AT, Eyes-Brown, PERL, EOMI, Conjunctiva-Pink, Sclera-Non-icteric Neck: + JVD, No bruit, Trachea midline. Lungs:  Clearing, Bilateral. Cardiac:  Regular rhythm, normal S1 and S2, no S3. II/VI systolic murmur. Abdomen:  Soft, non-tender. BS present. Extremities:  1+ edema, left more than right lower leg, present. No cyanosis. No clubbing. CNS: AxOx3, Cranial nerves grossly intact, moves all 4 extremities.  Skin: Warm and dry.   Intake/Output from previous day: 02/05 0701 - 02/06 0700 In: 121.7 [I.V.:71.7; IV Piggyback:50] Out: 2100 [Urine:2100]    Lab Results: BMET    Component Value Date/Time   NA 134 (L) 08/16/2017 0504   NA 135 08/14/2017 0540   NA 138 08/13/2017 0753   K 4.6 08/16/2017 0504   K 4.8 08/14/2017 0540   K 4.6 08/13/2017 0753   CL 96 (L) 08/16/2017 0504   CL 100 (L) 08/14/2017 0540   CL 101 08/13/2017 0753   CO2 23 08/16/2017 0504   CO2 23 08/14/2017 0540   CO2 23 08/13/2017 0753   GLUCOSE 120 (H) 08/16/2017 0504   GLUCOSE 113 (H) 08/14/2017 0540   GLUCOSE 97 08/13/2017 0753   BUN 68 (H) 08/16/2017 0504   BUN 77 (H) 08/14/2017 0540   BUN 67 (H) 08/13/2017 0753   CREATININE 1.83 (H) 08/16/2017 0504   CREATININE 2.83 (H) 08/14/2017 0540   CREATININE 2.73  (H) 08/13/2017 0753   CALCIUM 8.8 (L) 08/16/2017 0504   CALCIUM 9.0 08/14/2017 0540   CALCIUM 9.1 08/13/2017 0753   GFRNONAA 28 (L) 08/16/2017 0504   GFRNONAA 16 (L) 08/14/2017 0540   GFRNONAA 17 (L) 08/13/2017 0753   GFRAA 32 (L) 08/16/2017 0504   GFRAA 19 (L) 08/14/2017 0540   GFRAA 20 (L) 08/13/2017 0753   CBC    Component Value Date/Time   WBC 6.3 08/14/2017 0540   RBC 4.59 08/14/2017 0540   HGB 12.7 08/14/2017 0540   HCT 37.2 08/14/2017 0540   PLT 216 08/14/2017 0540   MCV 81.0 08/14/2017 0540   MCH 27.7 08/14/2017 0540   MCHC 34.1 08/14/2017 0540   RDW 17.9 (H) 08/14/2017 0540   LYMPHSABS 1.0 08/11/2017 1741   MONOABS 0.6 08/11/2017 1741   EOSABS 0.0 08/11/2017 1741   BASOSABS 0.0 08/11/2017 1741   HEPATIC Function Panel Recent Labs    05/28/17 0518 07/26/17 1326 08/11/17 1741  PROT 6.7 7.9 8.2*   HEMOGLOBIN A1C No components found for: HGA1C,  MPG CARDIAC ENZYMES Lab Results  Component Value Date   CKTOTAL 59 09/07/2012   CKMB 3.2 09/07/2012   TROPONINI 0.07 (HH) 05/22/2017   TROPONINI 0.07 (HH) 05/21/2017   TROPONINI 0.08 (HH) 05/21/2017   BNP No results for input(s): PROBNP in the  last 8760 hours. TSH No results for input(s): TSH in the last 8760 hours. CHOLESTEROL No results for input(s): CHOL in the last 8760 hours.  Scheduled Meds: . allopurinol  100 mg Oral Daily  . carvedilol  6.25 mg Oral BID WC  . collagenase  1 application Topical Daily  . cycloSPORINE  1 drop Both Eyes Daily  . furosemide  80 mg Intravenous Once  . heparin  5,000 Units Subcutaneous Q8H  . metolazone  5 mg Oral Once per day on Mon Wed Fri  . polyvinyl alcohol  1 drop Both Eyes Daily  . potassium chloride  10 mEq Oral Daily  . sodium chloride flush  3 mL Intravenous Q12H   Continuous Infusions: . sodium chloride    . cefTRIAXone (ROCEPHIN)  IV Stopped (08/15/17 2326)  . DOPamine 5 mcg/kg/min (08/16/17 0405)   PRN Meds:.sodium chloride, albuterol, oxyCODONE, sodium  chloride flush  Assessment/Plan: Cellulitis of both lower leg with recurrent ulcers Acute on chronic biventricular heart failure Hypertension Status post LAD stent Type 2 diabetes mellitus Moderate protein calorie malnutrition Moderate MR Severe TR Acute on chronic kidney disease, 3  Continue diuresis. Vascular surgery consult.   LOS: 5 days    Tanya Cobb  MD  08/16/2017, 9:21 AM

## 2017-08-16 NOTE — Progress Notes (Signed)
Pharmacist Heart Failure Core Measure Documentation  Assessment: [name] has an EF documented as 25-30 on 06/13/16 by 2D echo  Rationale: Heart failure patients with left ventricular systolic dysfunction (LVSD) and an EF < 40% should be prescribed an angiotensin converting enzyme inhibitor (ACEI) or angiotensin receptor blocker (ARB) at discharge unless a contraindication is documented in the medical record.  This patient is not currently on an ACEI or ARB for HF.  This note is being placed in the record in order to provide documentation that a contraindication to the use of these agents is present for this encounter.  ACE Inhibitor or Angiotensin Receptor Blocker is contraindicated (specify all that apply)  []   ACEI allergy AND ARB allergy []   Angioedema []   Moderate or severe aortic stenosis []   Hyperkalemia []   Hypotension []   Renal artery stenosis [x]   Worsening renal function, preexisting renal disease or dysfunction   [place] 08/16/2017 4:19 PM

## 2017-08-17 ENCOUNTER — Inpatient Hospital Stay (HOSPITAL_COMMUNITY): Payer: Medicare HMO

## 2017-08-17 DIAGNOSIS — L0390 Cellulitis, unspecified: Secondary | ICD-10-CM

## 2017-08-17 LAB — BASIC METABOLIC PANEL
ANION GAP: 15 (ref 5–15)
BUN: 60 mg/dL — AB (ref 6–20)
CHLORIDE: 93 mmol/L — AB (ref 101–111)
CO2: 25 mmol/L (ref 22–32)
Calcium: 8.5 mg/dL — ABNORMAL LOW (ref 8.9–10.3)
Creatinine, Ser: 2 mg/dL — ABNORMAL HIGH (ref 0.44–1.00)
GFR calc Af Amer: 29 mL/min — ABNORMAL LOW (ref >60)
GFR, EST NON AFRICAN AMERICAN: 25 mL/min — AB (ref >60)
GLUCOSE: 113 mg/dL — AB (ref 65–99)
POTASSIUM: 3.8 mmol/L (ref 3.5–5.1)
Sodium: 133 mmol/L — ABNORMAL LOW (ref 135–145)

## 2017-08-17 LAB — MAGNESIUM: Magnesium: 1.9 mg/dL (ref 1.7–2.4)

## 2017-08-17 MED ORDER — CEFUROXIME AXETIL 500 MG PO TABS
500.0000 mg | ORAL_TABLET | Freq: Every day | ORAL | Status: DC
  Administered 2017-08-17 – 2017-08-19 (×3): 500 mg via ORAL
  Filled 2017-08-17 (×3): qty 1

## 2017-08-17 NOTE — Progress Notes (Signed)
Bilateral lower extremity arterial dup[lex completed. Technically difficult due to ulcerations, pain and plaque formation. There is no obvious evidence of a significant high grade stenosis. There is diminishing velocities and Doppler waveforms imaging distally down the leg bilaterally..Further evaluation may be necessary. [place] [name],RVS 08/17/2017 7:09 PM

## 2017-08-17 NOTE — Progress Notes (Signed)
  Progress Note    08/17/2017 3:31 PM  Subjective:  Pain and swelling improved LLE per patient   Vitals:   08/16/17 2232 08/17/17 0500  BP: 140/72 (!) 148/64  Pulse: 75 80  Resp: 18 18  Temp: 99.3 F (37.4 C) 99.2 F (37.3 C)  SpO2: 95% 96%   Physical Exam: Lungs:  Non labored Extremities:  Dressings of bilateral shins left in place; edema BLE improved however still pitting; faintly palpable L DP Abdomen:  Soft Neurologic: A&O  CBC    Component Value Date/Time   WBC 6.3 08/14/2017 0540   RBC 4.59 08/14/2017 0540   HGB 12.7 08/14/2017 0540   HCT 37.2 08/14/2017 0540   PLT 216 08/14/2017 0540   MCV 81.0 08/14/2017 0540   MCH 27.7 08/14/2017 0540   MCHC 34.1 08/14/2017 0540   RDW 17.9 (H) 08/14/2017 0540   LYMPHSABS 1.0 08/11/2017 1741   MONOABS 0.6 08/11/2017 1741   EOSABS 0.0 08/11/2017 1741   BASOSABS 0.0 08/11/2017 1741    BMET    Component Value Date/Time   NA 133 (L) 08/17/2017 0811   K 3.8 08/17/2017 0811   CL 93 (L) 08/17/2017 0811   CO2 25 08/17/2017 0811   GLUCOSE 113 (H) 08/17/2017 0811   BUN 60 (H) 08/17/2017 0811   CREATININE 2.00 (H) 08/17/2017 0811   CALCIUM 8.5 (L) 08/17/2017 0811   GFRNONAA 25 (L) 08/17/2017 0811   GFRAA 29 (L) 08/17/2017 0811    INR    Component Value Date/Time   INR 1.14 09/30/2016 0242     Intake/Output Summary (Last 24 hours) at 08/17/2017 1531 Last data filed at 08/17/2017 0130 Gross per 24 hour  Intake -  Output 1500 ml  Net -1500 ml     Assessment/Plan:  67 y.o. female with with BLE ulcerations  Continue IV antibiotics for cellulitis Continue current santyl dressing changes Faintly palpable L DP with improvement of edema on exam today Treatment plan pending arterial duplex results   Tanya RutterMatthew Kartik Fernando, PA-C [facility] [phone] 08/17/2017 3:31 PM

## 2017-08-17 NOTE — Progress Notes (Signed)
Ref: [name], Altan Kraai, MD   Subjective:  Discussed chronicity of care with wounds and no help at home. Lower extremity vascular study with decreased flow in leg leg distally, probably can be managed medically for now. Patient agrees to short term SNF.  Objective:  Vital Signs in the last 24 hours: Temp:  [98.8 F (37.1 C)-99.3 F (37.4 C)] 98.8 F (37.1 C) (02/07 1550) Pulse Rate:  [70-80] 70 (02/07 1550) Cardiac Rhythm: Normal sinus rhythm (02/07 0706) Resp:  [18] 18 (02/07 1550) BP: (130-148)/(59-72) 134/70 (02/07 1821) SpO2:  [95 %-96 %] 96 % (02/07 0500) Weight:  [69.8 kg (153 lb 14.1 oz)] 69.8 kg (153 lb 14.1 oz) (02/07 0500)  Physical Exam: BP Readings from Last 1 Encounters:  08/17/17 134/70     Wt Readings from Last 1 Encounters:  08/17/17 69.8 kg (153 lb 14.1 oz)    Weight change: -3.909 kg (-9.9 oz) Body mass index is 26.41 kg/m. HEENT: /AT, Eyes-Brown, PERL, EOMI, Conjunctiva-Pink, Sclera-Non-icteric Neck: No JVD, No bruit, Trachea midline. Lungs:  Clear, Bilateral. Cardiac:  Regular rhythm, normal S1 and S2, no S3. II/VI systolic murmur. Abdomen:  Soft, non-tender. BS present. Extremities:  1+ edema present. Healing lower leg wounds. No cyanosis. No clubbing. CNS: AxOx3, Cranial nerves grossly intact, moves all 4 extremities.  Skin: Warm and dry.   Intake/Output from previous day: 02/06 0701 - 02/07 0700 In: 220 [P.O.:220] Out: 2500 [Urine:2500]    Lab Results: BMET    Component Value Date/Time   NA 133 (L) 08/17/2017 0811   NA 134 (L) 08/16/2017 0504   NA 135 08/14/2017 0540   K 3.8 08/17/2017 0811   K 4.6 08/16/2017 0504   K 4.8 08/14/2017 0540   CL 93 (L) 08/17/2017 0811   CL 96 (L) 08/16/2017 0504   CL 100 (L) 08/14/2017 0540   CO2 25 08/17/2017 0811   CO2 23 08/16/2017 0504   CO2 23 08/14/2017 0540   GLUCOSE 113 (H) 08/17/2017 0811   GLUCOSE 120 (H) 08/16/2017 0504   GLUCOSE 113 (H) 08/14/2017 0540   BUN 60 (H) 08/17/2017 0811   BUN 68  (H) 08/16/2017 0504   BUN 77 (H) 08/14/2017 0540   CREATININE 2.00 (H) 08/17/2017 0811   CREATININE 1.83 (H) 08/16/2017 0504   CREATININE 2.83 (H) 08/14/2017 0540   CALCIUM 8.5 (L) 08/17/2017 0811   CALCIUM 8.8 (L) 08/16/2017 0504   CALCIUM 9.0 08/14/2017 0540   GFRNONAA 25 (L) 08/17/2017 0811   GFRNONAA 28 (L) 08/16/2017 0504   GFRNONAA 16 (L) 08/14/2017 0540   GFRAA 29 (L) 08/17/2017 0811   GFRAA 32 (L) 08/16/2017 0504   GFRAA 19 (L) 08/14/2017 0540   CBC    Component Value Date/Time   WBC 6.3 08/14/2017 0540   RBC 4.59 08/14/2017 0540   HGB 12.7 08/14/2017 0540   HCT 37.2 08/14/2017 0540   PLT 216 08/14/2017 0540   MCV 81.0 08/14/2017 0540   MCH 27.7 08/14/2017 0540   MCHC 34.1 08/14/2017 0540   RDW 17.9 (H) 08/14/2017 0540   LYMPHSABS 1.0 08/11/2017 1741   MONOABS 0.6 08/11/2017 1741   EOSABS 0.0 08/11/2017 1741   BASOSABS 0.0 08/11/2017 1741   HEPATIC Function Panel Recent Labs    05/28/17 0518 07/26/17 1326 08/11/17 1741  PROT 6.7 7.9 8.2*   HEMOGLOBIN A1C No components found for: HGA1C,  MPG CARDIAC ENZYMES Lab Results  Component Value Date   CKTOTAL 59 09/07/2012   CKMB 3.2 09/07/2012  TROPONINI 0.07 (HH) 05/22/2017   TROPONINI 0.07 (HH) 05/21/2017   TROPONINI 0.08 (HH) 05/21/2017   BNP No results for input(s): PROBNP in the last 8760 hours. TSH No results for input(s): TSH in the last 8760 hours. CHOLESTEROL No results for input(s): CHOL in the last 8760 hours.  Scheduled Meds: . allopurinol  100 mg Oral Daily  . carvedilol  6.25 mg Oral BID WC  . cefUROXime  500 mg Oral Daily  . collagenase  1 application Topical Daily  . cycloSPORINE  1 drop Both Eyes Daily  . heparin  5,000 Units Subcutaneous Q8H  . polyvinyl alcohol  1 drop Both Eyes Daily  . potassium chloride  10 mEq Oral Daily  . sodium chloride flush  3 mL Intravenous Q12H   Continuous Infusions: . sodium chloride     PRN Meds:.sodium chloride, albuterol, oxyCODONE, sodium  chloride flush  Assessment/Plan: Cellulitis of both lower legs with recurrent ulcers Acute on chronic biventricular, systolic heart failure Hypertension S/P LAD stent Type 2 DM Moderate protein calorie malnutrition Moderate MR Severe TR Acute on chronic kidney disease, III  Change IV antibiotic to PO. Discontinue dopamine drip. Increase activity as tolerated. PT/OT consult SNF placement.   LOS: 6 days    [name]  MD  08/17/2017, 7:01 PM

## 2017-08-17 NOTE — Progress Notes (Signed)
Patient refusing low bed at this time. Education provided.

## 2017-08-17 NOTE — Progress Notes (Signed)
Verified with doctor Algie CofferKadakia, patient is to telemetry status.

## 2017-08-17 NOTE — Progress Notes (Signed)
Informed by lab technician that pt refused her morning lab draw.

## 2017-08-17 NOTE — Progress Notes (Signed)
Patient refused blood work stating "im getting discharged and im tired of being stuck" will continue to monitor patients.

## 2017-08-18 MED ORDER — FUROSEMIDE 80 MG PO TABS
80.0000 mg | ORAL_TABLET | Freq: Every day | ORAL | Status: DC
  Administered 2017-08-18 – 2017-08-19 (×2): 80 mg via ORAL
  Filled 2017-08-18 (×2): qty 1

## 2017-08-18 NOTE — Evaluation (Signed)
Occupational Therapy Evaluation Patient Details Name: [name] MRN: [id number] DOB: August 23, 1950 Today's Date: 08/18/2017    History of Present Illness 67 y.o. female with PMH significant for CHF, CAD, hyperlipidemia, HTN, type 2 diabetes mellitus, CKD stage III who has been admitted for cellulitis and ulcerations of bilateral lower extremities secondary to edema in the presence of CHF.     Clinical Impression   Pt admitted with the above diagnoses and presents with below problem list. Pt will benefit from continued acute OT to address the below listed deficits and maximize independence with basic ADLs prior to d/c to next venue. PTA pt was mod I with ADLs. Pt is currently max A +2 for LB ADLs and functional transfers/mobility. Pt very sensitive to light touch and movement of B shins/ankles/feet; 10/10 pain. Recommend rehab at SNF prior to d/c back home.      Follow Up Recommendations  SNF    Equipment Recommendations  Other (comment)(defer to next venue)    Recommendations for Other Services PT consult     Precautions / Restrictions Precautions Precautions: Fall Precaution Comments: B shins, ankles, feet very sensitive, very painful with light touch Restrictions Weight Bearing Restrictions: No      Mobility Bed Mobility               General bed mobility comments: up in recliner on arrival of OT   Transfers Overall transfer level: Needs assistance Equipment used: Rolling walker (2 wheeled) Transfers: Sit to/from Stand Sit to Stand: Max assist         General transfer comment: extra time, effort. Painful but pt reluctantly agreeable to standing 1x. Max A to powerup.     Balance Overall balance assessment: Needs assistance Sitting-balance support: Feet supported Sitting balance-Leahy Scale: Fair     Standing balance support: Bilateral upper extremity supported Standing balance-Leahy Scale: Poor Standing balance comment: cane at baseline, rw during OT  eval                           ADL either performed or assessed with clinical judgement   ADL Overall ADL's : Needs assistance/impaired Eating/Feeding: Set up;Sitting   Grooming: Set up;Sitting   Upper Body Bathing: Set up;Minimal assistance;Sitting   Lower Body Bathing: Maximal assistance;Sit to/from stand;+2 for safety/equipment;+2 for physical assistance   Upper Body Dressing : Set up;Minimal assistance;Sitting   Lower Body Dressing: Maximal assistance;+2 for physical assistance;+2 for safety/equipment;Sit to/from stand   Toilet Transfer: Maximal assistance;+2 for physical assistance;Stand-pivot;BSC;RW Toilet Transfer Details (indicate cue type and reason): clinical judgment, received in recliner.  Toileting- Clothing Manipulation and Hygiene: Maximal assistance;+2 for physical assistance;+2 for safety/equipment;Sit to/from stand   Tub/ Shower Transfer: Maximal assistance;+2 for physical assistance;+2 for safety/equipment;Stand-pivot     General ADL Comments: Pt completed 1x sit<>stand from recliner. Session limited by 10/10 pain.     Vision         Perception     Praxis      Pertinent Vitals/Pain Pain Assessment: Faces Faces Pain Scale: Hurts worst Pain Location: BLE increases sharply with touch and movement Pain Descriptors / Indicators: Grimacing;Moaning;Guarding Pain Intervention(s): Limited activity within patient's tolerance;Monitored during session;Repositioned     Hand Dominance     Extremity/Trunk Assessment Upper Extremity Assessment Upper Extremity Assessment: Overall WFL for tasks assessed   Lower Extremity Assessment Lower Extremity Assessment: Defer to PT evaluation   Cervical / Trunk Assessment Cervical / Trunk Assessment: Normal   Communication Communication  Communication: No difficulties   Cognition Arousal/Alertness: Awake/alert Behavior During Therapy: WFL for tasks assessed/performed Overall Cognitive Status: Within  Functional Limits for tasks assessed                                     General Comments       Exercises Exercises: Other exercises Other Exercises Other Exercises: discussed general UB strengthening exercises to help prevent deconditioning during recovery.   Shoulder Instructions      Home Living Family/patient expects to be discharged to:: Skilled nursing facility                                        Prior Functioning/Environment Level of Independence: Independent with assistive device(s)        Comments: cane for mobility, no assist for ADLs        OT Problem List: Impaired balance (sitting and/or standing);Decreased activity tolerance;Decreased knowledge of use of DME or AE;Decreased knowledge of precautions;Pain;Increased edema      OT Treatment/Interventions: Self-care/ADL training;DME and/or AE instruction;Therapeutic activities;Patient/family education;Balance training    OT Goals(Current goals can be found in the care plan section) Acute Rehab OT Goals Patient Stated Goal: SNF for rehab then home. Reduce pain.  OT Goal Formulation: With patient Time For Goal Achievement: 09/01/17 Potential to Achieve Goals: Good ADL Goals Pt Will Perform Lower Body Bathing: with mod assist;sit to/from stand Pt Will Perform Lower Body Dressing: with mod assist;sit to/from stand Pt Will Transfer to Toilet: with mod assist;ambulating Pt Will Perform Toileting - Clothing Manipulation and hygiene: with mod assist;sit to/from stand Additional ADL Goal #1: Pt will complete bed mobility at min A level to prepare for OOB ADLs.  OT Frequency: Min 2X/week   Barriers to D/C:            Co-evaluation              AM-PAC PT "6 Clicks" Daily Activity     Outcome Measure Help from another person eating meals?: None Help from another person taking care of personal grooming?: A Little Help from another person toileting, which includes using  toliet, bedpan, or urinal?: Total Help from another person bathing (including washing, rinsing, drying)?: A Lot Help from another person to put on and taking off regular upper body clothing?: A Little Help from another person to put on and taking off regular lower body clothing?: Total 6 Click Score: 14   End of Session Equipment Utilized During Treatment: Rolling walker;Gait belt Nurse Communication: Mobility status  Activity Tolerance: Patient limited by pain Patient left: in chair;with call bell/phone within reach;with chair alarm set  OT Visit Diagnosis: Unsteadiness on feet (R26.81);Pain                Time: 1020-1036 OT Time Calculation (min): 16 min Charges:  OT General Charges $OT Visit: 1 Visit OT Evaluation $OT Eval Low Complexity: 1 Low G-Codes:       Pilar GrammesMathews, Jalyah Weinheimer H 08/18/2017, 10:54 AM

## 2017-08-18 NOTE — Clinical Social Work Note (Signed)
Clinical Social Work Assessment  Patient Details  Name: Tanya SpikeMargaret A [name] MRN: [id number] Date of Birth: 1950-11-02  Date of referral:  08/18/17               Reason for consult:  Facility Placement                Permission sought to share information with:  Facility Industrial/product designerContact Representative Permission granted to share information::  Yes, Verbal Permission Granted  Name::        Agency::  SNFs  Relationship::     Contact Information:     Housing/Transportation Living arrangements for the past 2 months:  Single Family Home Source of Information:  Patient Patient Interpreter Needed:  None Criminal Activity/Legal Involvement Pertinent to Current Situation/Hospitalization:  No - Comment as needed Significant Relationships:  Adult Children, Spouse Lives with:  Spouse Do you feel safe going back to the place where you live?  No Need for family participation in patient care:  Yes (Comment)  Care giving concerns:  CSW received consult for possible SNF placement at time of discharge. CSW spoke with patient regarding PT recommendation of SNF placement at time of discharge. Patient reported that patient's family is currently unable to care for patient at their home given patient's current physical needs and fall risk. Patient expressed understanding of PT recommendation and is agreeable to SNF placement at time of discharge. CSW to continue to follow and assist with discharge planning needs.   Social Worker assessment / plan:  CSW spoke with patient [facility].  Employment status:  Retired Database administratornsurance information:  Managed Medicare PT Recommendations:  Skilled Nursing Facility Information / Referral to community resources:  Skilled Nursing Facility  Patient/Family's Response to care:  Patient recognizes need for rehab before returning home and [place]. CSW explained that [place] that [place] with [employer]. Due to it being the weekend, [name] will not be able to provide authorization until next week. Out of those facilities, only [facility] accepts 5-day LOGs. [facility] reviewing patient to see if they can accept LOG for discharge today. Patient's other option is to discharge home and have home health start authorization at one of the other facilities if patient prefers those facilities. Patient reports that her daughter is coming to the hospital and they will call CSW with their decision.   Patient/Family's Understanding of and Emotional Response to Diagnosis, Current Treatment, and Prognosis:  Patient/family is realistic regarding therapy needs and expressed being hopeful for SNF placement. Patient expressed understanding of CSW role and discharge process as well as medical condition. No questions/concerns about plan or treatment.    Emotional Assessment Appearance:  Appears stated age Attitude/Demeanor/Rapport:  Other(Appropriate) Affect (typically observed):  Anxious Orientation:  Oriented to Self, Oriented to Place, Oriented to Situation, Oriented to  Time Alcohol / Substance use:  Not Applicable Psych involvement (Current and /or in the community):  No (Comment)  Discharge Needs  Concerns to be addressed:  Care Coordination Readmission within the last 30 days:  Yes Current discharge risk:  None Barriers to Discharge:  No Barriers Identified   [name], LCSWA 08/18/2017, 11:29 AM

## 2017-08-18 NOTE — Progress Notes (Signed)
Per RNCM, patient's daughter now accepting [facility] offer. 5 day log placed in AD Director's box. Patient may discharge tomorrow (Saturday to facility) due not having a discharge summary in yet for today.  Osborne Cascoadia Annice Jolly LCSW [phone]

## 2017-08-18 NOTE — Progress Notes (Signed)
Arterial duplex reviewed.  Has reasonable flow to both legs for healing a calf wound.  Would continue local wound care and follow up at the wound center as outpt  Will sign off  Fabienne Brunsharles Kouper Spinella, MD [facility] [place] Office: [phone] Pager: [phone]

## 2017-08-18 NOTE — Progress Notes (Signed)
Per MD, patient medically stable for discharge today. Patient's daughter refusing safe discharge option provided, which would be a [facility] day [facility] to [facility]. She sates she does not want that facility but [facility]. [facility] unable to accept patient due to not having [employer] authorization and they do not accept logs. CSW explained to daughter that patient can have [facility] start authorization on Monday if patient discharges to [facility] today. Patient daughter continued to refuse discharge to SNF or to home. [facility] and medical director who directed RNCM to give patient a HINN.   Osborne Cascoadia Johnny Gorter LCSW [phone]

## 2017-08-18 NOTE — [place] FL2 (Signed)
[place] MEDICAID FL2 LEVEL OF CARE SCREENING TOOL     IDENTIFICATION  Patient Name: [name] Birthdate: [birth year] Sex: female Admission Date (Current Location): 08/11/2017  [facility] and [place] Number:  Producer, television/film/video and Address:  [facility] [place], [address] [place], [place] [address]      Provider Number: [id number]  Attending Physician Name and Address:  [name], MD  Relative Name and Phone Number:  Stephannie Peters, spouse, [phone]    Current Level of Care: Hospital Recommended Level of Care: Skilled Nursing Facility Prior Approval Number:    Date Approved/Denied:   PASRR Number: [id number]  Discharge Plan: SNF    Current Diagnoses: Patient Active Problem List   Diagnosis Date Noted  . Bilateral cellulitis of lower leg 08/11/2017    Class: Acute  . Acute on chronic systolic CHF (congestive heart failure), NYHA class 3 (HCC) 05/21/2017  . Acute systolic heart failure (HCC) 03/11/2017  . Malnutrition of moderate degree 07/27/2016  . Biventricular heart failure, NYHA class 2 (HCC) 08/12/2014  . PROTEINURIA, MILD 07/21/2006  . Diabetes mellitus (HCC) 05/24/2006  . HYPERLIPIDEMIA 05/24/2006  . Essential hypertension 05/24/2006    Orientation RESPIRATION BLADDER Height & Weight     Self, Time, Situation, Place  Normal Continent Weight: 69.8 kg (153 lb 14.1 oz) Height:  5\' 4"  (162.6 cm)  BEHAVIORAL SYMPTOMS/MOOD NEUROLOGICAL BOWEL NUTRITION STATUS      Continent Diet(Please see DC Summary)  AMBULATORY STATUS COMMUNICATION OF NEEDS Skin   Limited Assist Verbally Other (Comment)(Diabetic wound on leg)                       Personal Care Assistance Level of Assistance  Bathing, Feeding, Dressing Bathing Assistance: Limited assistance Feeding assistance: Independent Dressing Assistance: Limited assistance     Functional Limitations Info             SPECIAL CARE FACTORS FREQUENCY  PT (By licensed PT)     PT  Frequency: 5x/week              Contractures      Additional Factors Info  Code Status, Allergies Code Status Info: Full Allergies Info: Bidil Isosorb Dinitrate-hydralazine           Current Medications (08/18/2017):  This is the current hospital active medication list Current Facility-Administered Medications  Medication Dose Route Frequency Provider Last Rate Last Dose  . 0.9 %  sodium chloride infusion  250 mL Intravenous PRN [name], MD      . albuterol (PROVENTIL) (2.5 MG/3ML) 0.083% nebulizer solution 2.5 mg  2.5 mg Nebulization Q6H PRN [name], MD      . allopurinol (ZYLOPRIM) tablet 100 mg  100 mg Oral Daily [name], MD   100 mg at 08/18/17 1038  . carvedilol (COREG) tablet 6.25 mg  6.25 mg Oral BID WC [name], MD   6.25 mg at 08/18/17 1038  . cefUROXime (CEFTIN) tablet 500 mg  500 mg Oral Daily [name], MD   500 mg at 08/18/17 1038  . collagenase (SANTYL) ointment 1 application  1 application Topical Daily [name], MD   1 application at 08/16/17 1000  . cycloSPORINE (RESTASIS) 0.05 % ophthalmic emulsion 1 drop  1 drop Both Eyes Daily [name], MD   1 drop at 08/18/17 1038  . furosemide (LASIX) tablet 80 mg  80 mg Oral Daily [name], MD   80 mg at 08/18/17 1038  .  heparin injection 5,000 Units  5,000 Units Subcutaneous Q8H [name], Ajay, MD   5,000 Units at 08/17/17 1327  . oxyCODONE (Oxy IR/ROXICODONE) immediate release tablet 5 mg  5 mg Oral Q6H PRN [name], Ajay, MD   5 mg at 08/18/17 1038  . polyvinyl alcohol (LIQUIFILM TEARS) 1.4 % ophthalmic solution 1 drop  1 drop Both Eyes Daily [name], Ajay, MD   1 drop at 08/17/17 1019  . potassium chloride (K-DUR,KLOR-CON) CR tablet 10 mEq  10 mEq Oral Daily [name], Ajay, MD   10 mEq at 08/18/17 1038  . sodium chloride flush (NS) 0.9 % injection 3 mL  3 mL Intravenous Q12H [name], Ajay, MD   3 mL at 08/14/17 2156  . sodium chloride flush (NS) 0.9 % injection 3 mL  3 mL Intravenous  PRN [name], Ajay, MD         Discharge Medications: Please see discharge summary for a list of discharge medications.  Relevant Imaging Results:  Relevant Lab Results:   Additional Information SSN: [id number]  Alverta Caccamo S GerlachRayyan, [place]

## 2017-08-18 NOTE — Progress Notes (Signed)
NCM received call from pt's daughter, Luster LandsbergRenee. Luster LandsbergRenee has agreed for [facility] after speaking with MD. CM made CSW aware. Gae GallopAngela Riely Baskett RN,BSN,CM

## 2017-08-18 NOTE — Care Management Note (Signed)
Case Management Note  Patient Details  Name: [name] MRN: [id number] Date of Birth: March 01, 1951  Subjective/Objective:    Admitted with cellulitis and ulcerations of bilateral lower extremities secondary to edema in the presence of CHF.PMH significant for CHF, CAD, hyperlipidemia, HTN, type 2 diabetes mellitus, CKD stage III. Resides with husband.       [name] (Spouse) Yevette EdwardsRenee Stroud (Daughter)    [phone]     PCP: Orpah CobbAjay Kadakia  Action/Plan: Transition to SNF. CSW managing disposition to facility.  Expected Discharge Date:                  Expected Discharge Plan:  Skilled Nursing Facility  In-House Referral:  Clinical Social Work  Discharge planning Services  CM Consult  Post Acute Care Choice:  Home Health Choice offered to:  Patient   Status of Service:  Completed, signed off  If discussed at Long Length of Stay Meetings, dates discussed:    Additional Comments: Pt made HRI with [facility] in LOS on 2/7, however,  agency declined to accept pt 2/2  6 month noncompliance of previous relationship.  Gae GallopCole, Brianny Soulliere [place], RN 08/18/2017, 5:00 PM

## 2017-08-18 NOTE — Evaluation (Signed)
Physical Therapy Evaluation Patient Details Name: [name] MRN: [id number] DOB: [birth year] Today's Date: 08/18/2017   History of Present Illness  Pt is a 67 y.o. female with PMH significant for CHF, CAD, hyperlipidemia, HTN, type 2 diabetes mellitus, CKD stage III who has been admitted for cellulitis and ulcerations of bilateral lower extremities secondary to edema in the presence of CHF.      Clinical Impression  Pt presented sitting in recliner chair, awake and willing to participate in therapy session. Pt's daughter present throughout session as well. Prior to admission, pt reported that she ambulated with use of SPC. Pt currently very limited secondary to bilateral LE pain. Pt requires heavy physical assistance of two for transfers and is unable to tolerate ambulation at this time. Pt would continue to benefit from skilled physical therapy services at this time while admitted and after d/c to address the below listed limitations in order to improve overall safety and independence with functional mobility.      Follow Up Recommendations SNF;Supervision/Assistance - 24 hour    Equipment Recommendations  Wheelchair (measurements PT);Wheelchair cushion (measurements PT)    Recommendations for Other Services       Precautions / Restrictions Precautions Precautions: Fall Restrictions Weight Bearing Restrictions: No      Mobility  Bed Mobility               General bed mobility comments: pt OOB in recliner chait upon arrival  Transfers Overall transfer level: Needs assistance Equipment used: Rolling walker (2 wheeled) Transfers: Sit to/from Stand Sit to Stand: Mod assist;+2 physical assistance         General transfer comment: increased time and effort, cueing for safe hand placement, heavy assist to rise into standing from recliner chair. Pt performed sit<>stand x2. During first attempt, pt with heavy posterior lean and unable to achieve her balance requiring her  to return to sitting. Second attempt, pt able to achieve standing with mod A x2 and cueing for improved posture  Ambulation/Gait                Stairs            Wheelchair Mobility    Modified Rankin (Stroke Patients Only)       Balance Overall balance assessment: Needs assistance Sitting-balance support: Feet supported Sitting balance-Leahy Scale: Fair     Standing balance support: Bilateral upper extremity supported Standing balance-Leahy Scale: Poor                               Pertinent Vitals/Pain Pain Assessment: Faces Faces Pain Scale: Hurts worst Pain Location: bilateral LEs Pain Descriptors / Indicators: Grimacing;Moaning;Guarding Pain Intervention(s): Monitored during session;Repositioned    Home Living Family/patient expects to be discharged to:: Skilled nursing facility                      Prior Function Level of Independence: Independent with assistive device(s)         Comments: cane for mobility, no assist for ADLs     Hand Dominance        Extremity/Trunk Assessment   Upper Extremity Assessment Upper Extremity Assessment: Defer to OT evaluation    Lower Extremity Assessment Lower Extremity Assessment: Generalized weakness(edema throughout bilaterally)       Communication   Communication: No difficulties  Cognition Arousal/Alertness: Awake/alert Behavior During Therapy: WFL for tasks assessed/performed Overall Cognitive Status: Within Functional Limits  for tasks assessed                                        General Comments      Exercises     Assessment/Plan    PT Assessment Patient needs continued PT services  PT Problem List Decreased strength;Decreased activity tolerance;Decreased balance;Decreased mobility;Decreased coordination;Decreased knowledge of use of DME;Decreased safety awareness;Pain       PT Treatment Interventions DME instruction;Gait training;Stair  training;Functional mobility training;Therapeutic activities;Balance training;Therapeutic exercise;Neuromuscular re-education;Patient/family education    PT Goals (Current goals can be found in the Care Plan section)  Acute Rehab PT Goals Patient Stated Goal: decrease pain, go to rehab to get stronger PT Goal Formulation: With patient/family Time For Goal Achievement: 09/01/17 Potential to Achieve Goals: Fair    Frequency Min 2X/week   Barriers to discharge        Co-evaluation               AM-PAC PT "6 Clicks" Daily Activity  Outcome Measure Difficulty turning over in bed (including adjusting bedclothes, sheets and blankets)?: Unable Difficulty moving from lying on back to sitting on the side of the bed? : Unable Difficulty sitting down on and standing up from a chair with arms (e.g., wheelchair, bedside commode, etc,.)?: Unable Help needed moving to and from a bed to chair (including a wheelchair)?: A Lot Help needed walking in hospital room?: A Lot Help needed climbing 3-5 steps with a railing? : Total 6 Click Score: 8    End of Session Equipment Utilized During Treatment: Gait belt Activity Tolerance: Patient limited by pain Patient left: in chair;with call bell/phone within reach;with chair alarm set;with family/visitor present Nurse Communication: Mobility status PT Visit Diagnosis: Other abnormalities of gait and mobility (R26.89);Pain Pain - Right/Left: (bilateral) Pain - part of body: Leg    Time: [id number] PT Time Calculation (min) (ACUTE ONLY): 20 min   Charges:   PT Evaluation $PT Eval Moderate Complexity: 1 Mod     PT G Codes:        St. CharlesJennifer Laisa Larrick, PT, DPT [phone]   Alessandra BevelsJennifer M Sylvester Salonga 08/18/2017, 2:55 PM

## 2017-08-19 MED ORDER — CEFUROXIME AXETIL 500 MG PO TABS: 500.0000 mg | ORAL_TABLET | Freq: Every day | ORAL | 0 refills | Status: DC

## 2017-08-19 MED ORDER — FUROSEMIDE 80 MG PO TABS: 80.0000 mg | ORAL_TABLET | Freq: Every day | ORAL | 1 refills | Status: AC

## 2017-08-19 NOTE — Progress Notes (Signed)
Patient discharge teaching given, including activity, diet, follow-up appoints, and medications. Patient verbalized understanding of all discharge instructions. IV access was d/c'd. Vitals are stable. Skin is intact except as charted in most recent assessments. Pt to be escorted out by PTAR.  Attempted report x2 to [facility].

## 2017-08-19 NOTE — Progress Notes (Signed)
[facility] Anticipated DC Date:08/19/17 Family Notified: Valentino HueYes, Ernestene MentionMilton Hajduk spouse [phone], Yevette EdwardsRenee Stroud, daughter [phone] Transport By: Sharin MonsPTAR   Per MD patient ready for [facility] . RN, patient, patient's family, and facility notified of DC. Assessment, Fl2/Pasrr, and Discharge Summary sent to facility. RN given number for report [phone]). DC packet on chart. Ambulance transport requested for patient.   CSW signing off.  Budd Palmerara Carely Nappier LCSWA [phone]

## 2017-08-19 NOTE — Progress Notes (Signed)
Late entry Ref: Tanya Cobb, MD   Subjective:  Discussed with daughter about SNF. She and patient are in agreement for short term use.  Objective:  Vital Signs in the last 24 hours: Temp:  [97.7 F (36.5 C)-98.8 F (37.1 C)] 98.8 F (37.1 C) (02/09 0545) Pulse Rate:  [68-73] 73 (02/09 0545) Resp:  [16-24] 18 (02/09 0545) BP: (137-140)/(74-80) 140/80 (02/09 0545) SpO2:  [96 %-97 %] 97 % (02/09 0545)  Physical Exam: BP Readings from Last 1 Encounters:  08/19/17 140/80     Wt Readings from Last 1 Encounters:  08/17/17 69.8 kg (153 lb 14.1 oz)    Weight change:  Body mass index is 26.41 kg/m. HEENT: [place]/AT, Eyes-Brown, PERL, EOMI, Conjunctiva-Pink, Sclera-Non-icteric Neck: No JVD, No bruit, Trachea midline. Lungs:  Clear, Bilateral. Cardiac:  Regular rhythm, normal S1 and S2, no S3. II/VI systolic murmur. Abdomen:  Soft, non-tender. BS present. Extremities:  1+ edema present. No cyanosis. No clubbing. CNS: AxOx3, Cranial nerves grossly intact, moves all 4 extremities.  Skin: Warm and dry.   Intake/Output from previous day: 02/08 0701 - 02/09 0700 In: 340 [P.O.:340] Out: 950 [Urine:950]    Lab Results: BMET    Component Value Date/Time   NA 133 (L) 08/17/2017 0811   NA 134 (L) 08/16/2017 0504   NA 135 08/14/2017 0540   K 3.8 08/17/2017 0811   K 4.6 08/16/2017 0504   K 4.8 08/14/2017 0540   CL 93 (L) 08/17/2017 0811   CL 96 (L) 08/16/2017 0504   CL 100 (L) 08/14/2017 0540   CO2 25 08/17/2017 0811   CO2 23 08/16/2017 0504   CO2 23 08/14/2017 0540   GLUCOSE 113 (H) 08/17/2017 0811   GLUCOSE 120 (H) 08/16/2017 0504   GLUCOSE 113 (H) 08/14/2017 0540   BUN 60 (H) 08/17/2017 0811   BUN 68 (H) 08/16/2017 0504   BUN 77 (H) 08/14/2017 0540   CREATININE 2.00 (H) 08/17/2017 0811   CREATININE 1.83 (H) 08/16/2017 0504   CREATININE 2.83 (H) 08/14/2017 0540   CALCIUM 8.5 (L) 08/17/2017 0811   CALCIUM 8.8 (L) 08/16/2017 0504   CALCIUM 9.0 08/14/2017 0540   GFRNONAA  25 (L) 08/17/2017 0811   GFRNONAA 28 (L) 08/16/2017 0504   GFRNONAA 16 (L) 08/14/2017 0540   GFRAA 29 (L) 08/17/2017 0811   GFRAA 32 (L) 08/16/2017 0504   GFRAA 19 (L) 08/14/2017 0540   CBC    Component Value Date/Time   WBC 6.3 08/14/2017 0540   RBC 4.59 08/14/2017 0540   HGB 12.7 08/14/2017 0540   HCT 37.2 08/14/2017 0540   PLT 216 08/14/2017 0540   MCV 81.0 08/14/2017 0540   MCH 27.7 08/14/2017 0540   MCHC 34.1 08/14/2017 0540   RDW 17.9 (H) 08/14/2017 0540   LYMPHSABS 1.0 08/11/2017 1741   MONOABS 0.6 08/11/2017 1741   EOSABS 0.0 08/11/2017 1741   BASOSABS 0.0 08/11/2017 1741   HEPATIC Function Panel Recent Labs    05/28/17 0518 07/26/17 1326 08/11/17 1741  PROT 6.7 7.9 8.2*   HEMOGLOBIN A1C No components found for: HGA1C,  MPG CARDIAC ENZYMES Lab Results  Component Value Date   CKTOTAL 59 09/07/2012   CKMB 3.2 09/07/2012   TROPONINI 0.07 (HH) 05/22/2017   TROPONINI 0.07 (HH) 05/21/2017   TROPONINI 0.08 (HH) 05/21/2017   BNP No results for input(s): PROBNP in the last 8760 hours. TSH No results for input(s): TSH in the last 8760 hours. CHOLESTEROL No results for input(s): CHOL in the  last 8760 hours.  Scheduled Meds: . allopurinol  100 mg Oral Daily  . carvedilol  6.25 mg Oral BID WC  . cefUROXime  500 mg Oral Daily  . collagenase  1 application Topical Daily  . cycloSPORINE  1 drop Both Eyes Daily  . furosemide  80 mg Oral Daily  . heparin  5,000 Units Subcutaneous Q8H  . polyvinyl alcohol  1 drop Both Eyes Daily  . potassium chloride  10 mEq Oral Daily  . sodium chloride flush  3 mL Intravenous Q12H   Continuous Infusions: . sodium chloride     PRN Meds:.sodium chloride, albuterol, oxyCODONE, sodium chloride flush  Assessment/Plan: Cellulitis of both lower legs with ulcers Acute on chronic biventricular, systolic heart failure Hypertension CAD S/P LAD stent Moderate protein calorie malnutrition Moderate MR Severe TR Acute on chronic  kidney disease  Oral lasix. Increase activity SNF in AM.   LOS: 8 days    Tanya CobbAjay Delayne Sanzo  MD  08/19/2017, 10:24 AM

## 2017-08-19 NOTE — Discharge Summary (Signed)
Physician Discharge Summary  Patient ID: [name] MRN: [id number] DOB/AGE: 1950-11-21 67 y.o.  Admit date: 08/11/2017 Discharge date: 08/19/2017  Admission Diagnoses: Cellulitis of lower leg, bilateral Chronic biventricular heart failure Hypertension CAD S/P LAD stent Type 2 DM Moderate protein calorie malnutrition Moderate MR Severe TR CKD, 3  Discharge Diagnoses:  Principal Problem:   Bilateral cellulitis of lower leg Active Problems:   Acute on chronic biventricular systolic heart failure, NYHA class 2 (HCC)   Type 2 Diabetes mellitus (HCC)   Essential hypertension   Malnutrition of moderate degree   CAD   S/P LAD stent   Moderate MR   Severe TR   CKD, 3   PVD  Discharged Condition: stable  Hospital Course: 67 years old female had reactivation of her bilateral lower leg wounds with edema and pain. She has past medical history of biventricular systolic heart failure. She was given IV antibiotics followed by oral antibiotics. She also required IV dopamine drip along with IV furosemidefor chronic biventricular systolic heart failure with improved diuresis. She will follow heart failure booklet instructions and improve dietary compliance. She will see me in 1 week.          Consults: cardiology  Significant Diagnostic Studies: labs: Near normal CBC, low potassium of 2.7 meq., BUN of 56 and creatinine of 1.78 to 2.83. BNP was 2,371.  EKG-SR + low voltage and possible ASWMI.  Arterial duplex both lower legs showed diminishing velocities without high grade stenosis.  Treatments: antibiotics: vancomycin and ceftriaxone. Ceftin by mouth.  Discharge Exam: Blood pressure 140/80, pulse 73, temperature 98.8 F (37.1 C), temperature source Oral, resp. rate 18, height 5\' 4"  (1.626 m), weight 69.8 kg (153 lb 14.1 oz), SpO2 97 %. General appearance: alert, cooperative and appears stated age. Head: Normocephalic, atraumatic. Eyes: Brown eyes, pink conjunctiva, corneas clear.  PERRL, EOM's intact.  Neck: No adenopathy, no carotid bruit, no JVD, supple, symmetrical, trachea midline and thyroid not enlarged. Resp: Clear to auscultation bilaterally. Cardio: Regular rate and rhythm, S1, S2 normal, II/VI systolic murmur, no click, rub or gallop. GI: Soft, non-tender; bowel sounds normal; no organomegaly. Extremities: Trace edema with dressing of lower legs with healing wounds, no cyanosis or clubbing. Skin: Warm and dry.  Neurologic: Alert and oriented X 3, normal strength and tone. Normal coordination and slow gait with walker.  Disposition: 01-Home or Self Care   Allergies as of 08/19/2017      Reactions   Bidil [isosorb Dinitrate-hydralazine] Itching, Other (See Comments)   Jittery, feels like something is crawling      Medication List    TAKE these medications   acetaminophen 500 MG tablet Commonly known as:  TYLENOL Take 500 mg by mouth every 6 (six) hours as needed for moderate pain or headache.   albuterol 108 (90 Base) MCG/ACT inhaler Commonly known as:  PROVENTIL HFA;VENTOLIN HFA Inhale 2 puffs into the lungs every 6 (six) hours as needed for wheezing or shortness of breath.   allopurinol 100 MG tablet Commonly known as:  ZYLOPRIM Take 1 tablet (100 mg total) by mouth daily.   aspirin EC 81 MG tablet Take 81 mg by mouth daily.   carvedilol 6.25 MG tablet Commonly known as:  COREG Take 1 tablet (6.25 mg total) by mouth 2 (two) times daily with a meal.   cefUROXime 500 MG tablet Commonly known as:  CEFTIN Take 1 tablet (500 mg total) by mouth daily. Start taking on:  08/20/2017   collagenase ointment Commonly  known as:  SANTYL Apply 1 application topically daily.   cycloSPORINE 0.05 % ophthalmic emulsion Commonly known as:  RESTASIS Place 1 drop into both eyes daily.   furosemide 80 MG tablet Commonly known as:  LASIX Take 1 tablet (80 mg total) by mouth daily. Start taking on:  08/20/2017 What changed:  when to take this    GONIOVISC 2.5 % ophthalmic solution Generic drug:  hydroxypropyl methylcellulose / hypromellose Place 1 drop into both eyes daily.   metolazone 5 MG tablet Commonly known as:  ZAROXOLYN Take 1 tablet (5 mg total) by mouth every Monday, Wednesday, and Friday. One hour before AM furosemide - generic Lasix   nitroGLYCERIN 0.4 MG SL tablet Commonly known as:  NITROSTAT Place 0.4 mg under the tongue every 5 (five) minutes as needed for chest pain.   potassium chloride 10 MEQ tablet Commonly known as:  K-DUR,KLOR-CON Take 1 tablet (10 mEq total) by mouth daily.   silver sulfADIAZINE 1 % cream Commonly known as:  SILVADENE Apply topically 2 (two) times daily.      Follow-up Information    [name], Krishang Reading, MD. Schedule an appointment as soon as possible for a visit in 1 week(s).   Specialty:  Cardiology Contact information: [address] [place] [address] [phone]           Signed: Ricki Rodriguezjay S Jolie Strohecker 08/19/2017, 9:08 AM

## 2017-08-21 ENCOUNTER — Encounter: Payer: Self-pay | Admitting: Adult Health

## 2017-08-21 ENCOUNTER — Non-Acute Institutional Stay (SKILLED_NURSING_FACILITY): Payer: Medicare HMO | Admitting: Adult Health

## 2017-08-21 DIAGNOSIS — L03115 Cellulitis of right lower limb: Secondary | ICD-10-CM

## 2017-08-21 DIAGNOSIS — I129 Hypertensive chronic kidney disease with stage 1 through stage 4 chronic kidney disease, or unspecified chronic kidney disease: Secondary | ICD-10-CM

## 2017-08-21 DIAGNOSIS — I110 Hypertensive heart disease with heart failure: Secondary | ICD-10-CM | POA: Insufficient documentation

## 2017-08-21 DIAGNOSIS — N184 Chronic kidney disease, stage 4 (severe): Secondary | ICD-10-CM

## 2017-08-21 DIAGNOSIS — L03116 Cellulitis of left lower limb: Secondary | ICD-10-CM | POA: Diagnosis not present

## 2017-08-21 DIAGNOSIS — I25119 Atherosclerotic heart disease of native coronary artery with unspecified angina pectoris: Secondary | ICD-10-CM | POA: Diagnosis not present

## 2017-08-21 DIAGNOSIS — M1A09X0 Idiopathic chronic gout, multiple sites, without tophus (tophi): Secondary | ICD-10-CM

## 2017-08-21 DIAGNOSIS — E1122 Type 2 diabetes mellitus with diabetic chronic kidney disease: Secondary | ICD-10-CM

## 2017-08-21 DIAGNOSIS — I5082 Biventricular heart failure: Secondary | ICD-10-CM | POA: Diagnosis not present

## 2017-08-21 DIAGNOSIS — I2510 Atherosclerotic heart disease of native coronary artery without angina pectoris: Secondary | ICD-10-CM | POA: Insufficient documentation

## 2017-08-21 DIAGNOSIS — M1A9XX0 Chronic gout, unspecified, without tophus (tophi): Secondary | ICD-10-CM | POA: Insufficient documentation

## 2017-08-21 NOTE — Progress Notes (Signed)
Location:   [facility] Room Number: 120 Place of Service:  SNF (31)   CODE STATUS: full code   Allergies  Allergen Reactions  . Bidil [Isosorb Dinitrate-Hydralazine] Itching and Other (See Comments)    Jittery, feels like something is crawling    Chief Complaint  Patient presents with  . Hospitalization Follow-up    HPI:  She is a 67 year old woman who has been hospitalized from 08-11-17 through 08-19-17 due to her bilateral lower extremity ulcerations and cellulitis and her biventricular heart failure. She was given IV abt and has been transitioned to po ceftin for 7 days post hospitalization.  She will need to follow up with cardiology in one week. She is here for short term rehab with her goal to return back home. She is having pain in her bilateral lower extremities. The tylenol is not effective. She is not wanting a strong narcotic; but is willing to try low dose ultram.  She will continue to be followed for her chronic illnesses including her biventricular heart failure; diabetes and chronic renal disease. There are no reports of fevers present; no reports of changes in appetite. There are no nursing concerns at this time.   Past Medical History:  Diagnosis Date  . Biventricular heart failure (HCC)    systolic/notes 07/25/2016  . CHF (congestive heart failure) (HCC)   . Coronary artery disease   . Diet-controlled type 2 diabetes mellitus (HCC)   . GERD (gastroesophageal reflux disease)   . Gout   . High cholesterol   . History of gout   . Hypertension   . Shortness of breath dyspnea   . Stomach ulcer     Past Surgical History:  Procedure Laterality Date  . CARDIAC CATHETERIZATION  2014  . CORONARY ANGIOPLASTY WITH STENT PLACEMENT  2013  . LEFT AND RIGHT HEART CATHETERIZATION WITH CORONARY ANGIOGRAM N/A 11/25/2011   Procedure: LEFT AND RIGHT HEART CATHETERIZATION WITH CORONARY ANGIOGRAM;  Surgeon: Robynn PaneMohan N Harwani, MD;  Location: [facility] CATH [facility];  Service:  Cardiovascular;  Laterality: N/A;  . LEFT AND RIGHT HEART CATHETERIZATION WITH CORONARY ANGIOGRAM N/A 09/07/2012   Procedure: LEFT AND RIGHT HEART CATHETERIZATION WITH CORONARY ANGIOGRAM;  Surgeon: Ricki RodriguezAjay S Kadakia, MD;  Location: [facility] CATH [facility];  Service: Cardiovascular;  Laterality: N/A;  . PERCUTANEOUS CORONARY STENT INTERVENTION (PCI-S) Right 11/25/2011   Procedure: PERCUTANEOUS CORONARY STENT INTERVENTION (PCI-S);  Surgeon: Robynn PaneMohan N Harwani, MD;  [facility];  Service: Cardiovascular;  Laterality: Right;  . RIGHT/LEFT HEART CATH AND CORONARY ANGIOGRAPHY N/A 09/30/2016   Procedure: Right/Left Heart Cath and Coronary Angiography;  Surgeon: Orpah CobbAjay Kadakia, MD;  Location: [facility] INVASIVE CV LAB;  Service: Cardiovascular;  Laterality: N/A;  . TONSILLECTOMY  1950's    Social History   Socioeconomic History  . Marital status: Married    Spouse name: Not on file  . Number of children: Not on file  . Years of education: Not on file  . Highest education level: Not on file  Social Needs  . Financial resource strain: Not on file  . Food insecurity - worry: Not on file  . Food insecurity - inability: Not on file  . Transportation needs - medical: Not on file  . Transportation needs - non-medical: Not on file  Occupational History  . Not on file  Tobacco Use  . Smoking status: Former Smoker    Packs/day: 0.50    Years: 20.00    Pack years: 10.00  Types: Cigarettes  . Smokeless tobacco: Never Used  . Tobacco comment: quit smoking in the 80's  Substance and Sexual Activity  . Alcohol use: No  . Drug use: No  . Sexual activity: No  Other Topics Concern  . Not on file  Social History Narrative  . Not on file   History reviewed. No pertinent family history.    VITAL SIGNS BP (!) 147/78   Pulse 68   Temp (!) 96.5 F (35.8 C)   Resp 18   Ht 5\' 4"  (1.626 m)   Wt 142 lb (64.4 kg) Comment: hospital weight  SpO2 94%   BMI 24.37 kg/m   Outpatient Encounter Medications as of  08/21/2017  Medication Sig  . acetaminophen (TYLENOL) 500 MG tablet Take 500 mg by mouth every 6 (six) hours as needed for moderate pain or headache.  . albuterol (PROVENTIL HFA;VENTOLIN HFA) 108 (90 Base) MCG/ACT inhaler Inhale 2 puffs into the lungs every 6 (six) hours as needed for wheezing or shortness of breath.  . allopurinol (ZYLOPRIM) 100 MG tablet Take 1 tablet (100 mg total) by mouth daily.  Marland Kitchen aspirin EC 81 MG tablet Take 81 mg by mouth daily.  . carvedilol (COREG) 6.25 MG tablet Take 1 tablet (6.25 mg total) by mouth 2 (two) times daily with a meal.  . cefUROXime (CEFTIN) 500 MG tablet Take 1 tablet (500 mg total) by mouth daily.  . collagenase (SANTYL) ointment Apply 1 application topically daily.  . cycloSPORINE (RESTASIS) 0.05 % ophthalmic emulsion Place 1 drop into both eyes daily.  . furosemide (LASIX) 80 MG tablet Take 1 tablet (80 mg total) by mouth daily.  . hydroxypropyl methylcellulose / hypromellose (GONIOVISC) 2.5 % ophthalmic solution Place 1 drop into both eyes daily.  . metolazone (ZAROXOLYN) 5 MG tablet Take 1 tablet (5 mg total) by mouth every Monday, Wednesday, and Friday. One hour before AM furosemide - generic Lasix  . nitroGLYCERIN (NITROSTAT) 0.4 MG SL tablet Place 0.4 mg under the tongue every 5 (five) minutes as needed for chest pain.  . potassium chloride (K-DUR,KLOR-CON) 10 MEQ tablet Take 1 tablet (10 mEq total) by mouth daily.  . silver sulfADIAZINE (SILVADENE) 1 % cream Apply topically 2 (two) times daily.  . [DISCONTINUED] albuterol (PROVENTIL HFA;VENTOLIN HFA) 108 (90 Base) MCG/ACT inhaler Inhale 2 puffs into the lungs every 6 (six) hours as needed for wheezing or shortness of breath.   No facility-administered encounter medications on file as of 08/21/2017.      SIGNIFICANT DIAGNOSTIC EXAMS  TODAY:   07-26-17: chest x-ray: Cardiomegaly with mild central congestion. No edema or pleural effusion.   08-17-17: bilateral ABI:  Right: Unable to obtain ABIs  due to ulcerations and pain. There is no obvious evidence of a significant high grade stenosis. Velocities and Doppler waveforms diminsh moving distally down the leg. Left: Unable to obtian ABIs due to ulcerations and pain. There is no obvious evidence of a high grade stenosis. Velocities and Dopper waveforms diminish moving distally diwn the leg.   LABS REVIEWED: TODAY:   08-11-17: wbc 8.8; hgb 15.1; hct 45.6; mcv 81.7 plt 197; glucose 151; bun 56; creat 1.78; k+ 2.7; na++ 141; ca 9.0; total bili 3.2; albumin 3.4; blood culture: no growth 08-12-17: wbc 8.3; hgb 13.3; hct 41.0; mcv 82.2; plt 193; glucose 118; bun 60; creat 1.95; k+ 3.9; na++141; ca 8.7 08-13-17: glucose 97; bun 67; creat 2.73; k+ 4.6; na++ 138; ca 9.1; mag 2.0 08-14-17: BNP 2371.0 08-17-17: glucose 113;  bun 60; creat 2.00; k+ 3.8; na++ 133; ca 8.5; mag 1.9    Review of Systems  Constitutional: Negative for malaise/fatigue.  Respiratory: Negative for cough and shortness of breath.   Cardiovascular: Negative for chest pain, palpitations and leg swelling.  Gastrointestinal: Negative for abdominal pain, constipation and heartburn.  Musculoskeletal: Positive for myalgias. Negative for back pain and joint pain.       Has bilateral lower extremity pain with left greater than right   Skin:       Has bilateral lower extremity wounds which are painful   Neurological: Negative for dizziness.  Psychiatric/Behavioral: The patient is not nervous/anxious.     Physical Exam  Constitutional: She is oriented to person, place, and time. No distress.  Frail   Eyes: Conjunctivae are normal.  Neck: No thyromegaly present.  Cardiovascular: Normal rate and regular rhythm.  Murmur heard. 2/6 Unable to palpate pedal pulses   Pulmonary/Chest: Effort normal and breath sounds normal. No respiratory distress.  Abdominal: Soft. Bowel sounds are normal. She exhibits no distension. There is no tenderness.  Musculoskeletal: She exhibits no edema.  Is able  to move all extremities   Lymphadenopathy:    She has no cervical adenopathy.  Neurological: She is alert and oriented to person, place, and time.  Skin: Skin is warm and dry. She is not diaphoretic.  Bilateral lower extremities red; hot inflamed.  Has bilateral ulcerations to lower extremities Left lower extremity with 2 ulcerations and slough present Right lower extremity with 1 ulceration present   Psychiatric: She has a normal mood and affect.      ASSESSMENT/ PLAN:  TODAY:   1. Biventricular heart failure NYHA class 2: is stable EF 25-30% (09-30-16): will continue lasix 80 mg daily with k+ 10 meq daily and zaroxolyn 5 mg three days weekly.   2. CAD: is status post stent placement: is stable no complaints of chest pain: will continue asa 81 mg daily and prn ntg.   3. Hypertensive heart disease with heart failure and stage IV CKD: stable b/p 147/78: will continue coreg 6.25 mg twice daily   4. 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  Stage 4 chronic renal impairment associated with type 2 diabetes mellitus: stable bun 60 ;creat 2.00; will monitor   6. Chronic gout: stable no acute flares: will continue allopurinol 100 mg daily   7. Chronic dry eyes: stable will continue goniovisc to both eyes daily and restasis to both eyes daily   8.  Bilateral lower extremity cellulitis: is stable will continue ceftin 500 mg daily for a total 7 days post discharge  9. Bilateral lower extremity arterial ulcerations: will continue santyl to wound beds daily and will have patient to be seen by facility wound doctor.  Will begin ultram 25 mg every 6 hours as needed for pain and will monitor her status.   Will need to follow up with cardiology in one week   MD is aware of resident's narcotic use and is in agreement with current plan of care. We will attempt to wean resident as  apropriate   Synthia Innocent NP [facility]  Contact [phone] Monday through Friday 8am- 5pm  After hours call [phone]

## 2017-08-22 ENCOUNTER — Encounter (HOSPITAL_COMMUNITY): Payer: Medicare HMO

## 2017-08-24 ENCOUNTER — Encounter: Payer: Self-pay | Admitting: Internal Medicine

## 2017-08-24 ENCOUNTER — Non-Acute Institutional Stay (SKILLED_NURSING_FACILITY): Payer: Medicare HMO | Admitting: Internal Medicine

## 2017-08-24 DIAGNOSIS — E1122 Type 2 diabetes mellitus with diabetic chronic kidney disease: Secondary | ICD-10-CM | POA: Diagnosis not present

## 2017-08-24 DIAGNOSIS — I5082 Biventricular heart failure: Secondary | ICD-10-CM | POA: Diagnosis not present

## 2017-08-24 DIAGNOSIS — N184 Chronic kidney disease, stage 4 (severe): Secondary | ICD-10-CM

## 2017-08-24 DIAGNOSIS — L03116 Cellulitis of left lower limb: Secondary | ICD-10-CM | POA: Diagnosis not present

## 2017-08-24 DIAGNOSIS — L03115 Cellulitis of right lower limb: Secondary | ICD-10-CM

## 2017-08-24 NOTE — Progress Notes (Signed)
Provider:  DR Tanya KirschnerMONICA S Terressa [name] Location:  [facility] Room Number: 120B Place of Service:  SNF (31)  PCP: Tanya CobbKadakia, Ajay, MD Patient Care Team: Tanya CobbKadakia, Ajay, MD as PCP - General (Internal Medicine)  Extended Emergency Contact Information Primary Emergency Contact: [name] Address: [address]          [place], [place] [address] [place] Home Phone: [phone] Relation: Spouse Secondary Emergency Contact: [name] Address: [address]          [place], [place] [address] [place] Mobile Phone: [phone] Relation: Daughter  Code Status: Full Code Goals of Care: Advanced Directive information Advanced Directives 08/24/2017  Does Patient Have a Medical Advance Directive? Yes  Type of Advance Directive (No Data)  Does patient want to make changes to medical advance directive? No - Patient declined  Copy of Healthcare Power of Attorney in Chart? -  Would patient like information on creating a medical advance directive? -  Pre-existing out of facility DNR order (yellow form or pink MOST form) -      Chief Complaint  Patient presents with  . New Admit To SNF    [facility]    HPI: Patient is a 67 y.o. female seen today for admission to SNF following hospital stay for b/l LE cellulitis, acute/chronic biventricular systolic HF (NYHA class II). She was tx with IV abx and placed on IV dopamine gtt/IV furosemide 2/2 decompensated HF. She presents to SNF for short term rehab.   She c/o leg pain today. No CP, SOB. No f/c. No nursing issues. No falls. appetite ok.  CAD/ NYHA class II biventricular sHF - s/p stent placement; currently asymptomatic on ASA 81 mg daily and prn SLNTG; EF 25-30% (09-30-16). Takes lasix 80 mg daily with k+ 10 meq daily and zaroxolyn 5 mg three days weekly.  Marland Kitchen.   HTN - BP stable on coreg 6.25 mg twice daily   DM - diet controlled. He has CKD   CKD - stage 4. Cr  2.00  Chronic gout - no recent flares on allopurinol 100 mg daily   Chronic dry eyes - stable on goniovisc to both eyes daily and restasis to both eyes daily   Bilateral lower extremity arterial ulcerations - followed by facility wound care. Uses santyl to wound beds daily; takes ultram 25 mg every 6 hours as needed for pain  Past Medical History:  Diagnosis Date  . Biventricular heart failure (HCC)    systolic/notes 07/25/2016  . CHF (congestive heart failure) (HCC)   . Coronary artery disease   . Diet-controlled type 2 diabetes mellitus (HCC)   . GERD (gastroesophageal reflux disease)   . Gout   . High cholesterol   . History of gout   . Hypertension   . Shortness of breath dyspnea   . Stomach ulcer    Past Surgical History:  Procedure Laterality Date  . CARDIAC CATHETERIZATION  2014  . CORONARY ANGIOPLASTY WITH STENT PLACEMENT  2013  . LEFT AND RIGHT HEART CATHETERIZATION WITH CORONARY ANGIOGRAM N/A 11/25/2011   Procedure: LEFT AND RIGHT HEART CATHETERIZATION WITH CORONARY ANGIOGRAM;  Surgeon: Tanya PaneMohan N Harwani, MD;  Location: [facility] CATH LAB;  Service: Cardiovascular;  Laterality: N/A;  . LEFT AND RIGHT HEART CATHETERIZATION WITH CORONARY ANGIOGRAM N/A 09/07/2012   Procedure: LEFT AND RIGHT HEART CATHETERIZATION WITH CORONARY ANGIOGRAM;  Surgeon: Tanya RodriguezAjay S Kadakia, MD;  Location: [facility] CATH LAB;  Service: Cardiovascular;  Laterality:  N/A;  . PERCUTANEOUS CORONARY STENT INTERVENTION (PCI-S) Right 11/25/2011   Procedure: PERCUTANEOUS CORONARY STENT INTERVENTION (PCI-S);  Surgeon: Tanya Pane, MD;  Location: [facility] CATH LAB;  Service: Cardiovascular;  Laterality: Right;  . RIGHT/LEFT HEART CATH AND CORONARY ANGIOGRAPHY N/A 09/30/2016   Procedure: Right/Left Heart Cath and Coronary Angiography;  Surgeon: Tanya Cobb, MD;  Location: [facility] INVASIVE CV LAB;  Service: Cardiovascular;  Laterality: N/A;  . TONSILLECTOMY  1950's    reports that she has quit smoking. Her smoking use included cigarettes.  She has a 10.00 pack-year smoking history. she has never used smokeless tobacco. She reports that she does not drink alcohol or use drugs. Social History   Socioeconomic History  . Marital status: Married    Spouse name: Not on file  . Number of children: Not on file  . Years of education: Not on file  . Highest education level: Not on file  Social Needs  . Financial resource strain: Not on file  . Food insecurity - worry: Not on file  . Food insecurity - inability: Not on file  . Transportation needs - medical: Not on file  . Transportation needs - non-medical: Not on file  Occupational History  . Not on file  Tobacco Use  . Smoking status: Former Smoker    Packs/day: 0.50    Years: 20.00    Pack years: 10.00    Types: Cigarettes  . Smokeless tobacco: Never Used  . Tobacco comment: quit smoking in the 80's  Substance and Sexual Activity  . Alcohol use: No  . Drug use: No  . Sexual activity: No  Other Topics Concern  . Not on file  Social History Narrative  . Not on file    Functional Status Survey:    History reviewed. No pertinent family history.  Health Maintenance  Topic Date Due  . Hepatitis C Screening  09-02-1950  . FOOT EXAM  02/28/1962  . OPHTHALMOLOGY EXAM  02/28/1962  . URINE MICROALBUMIN  02/28/1962  . TETANUS/TDAP  03/01/1971  . PAP SMEAR  02/28/1973  . MAMMOGRAM  02/28/2002  . COLONOSCOPY  02/28/2002  . INFLUENZA VACCINE  02/08/2017  . DEXA SCAN  02/28/2017  . PNA vac Low Risk Adult (1 of 2 - PCV13) 02/28/2017  . HEMOGLOBIN A1C  09/08/2017  . HIV Screening  Completed    Allergies  Allergen Reactions  . Bidil [Isosorb Dinitrate-Hydralazine] Itching and Other (See Comments)    Jittery, feels like something is crawling    Outpatient Encounter Medications as of 08/24/2017  Medication Sig  . albuterol (PROVENTIL HFA;VENTOLIN HFA) 108 (90 Base) MCG/ACT inhaler Inhale 2 puffs into the lungs every 6 (six) hours as needed for wheezing or shortness  of breath.  . allopurinol (ZYLOPRIM) 100 MG tablet Take 1 tablet (100 mg total) by mouth daily.  Marland Kitchen aspirin EC 81 MG tablet Take 81 mg by mouth daily.  . carvedilol (COREG) 6.25 MG tablet Take 1 tablet (6.25 mg total) by mouth 2 (two) times daily with a meal.  . cefUROXime (CEFTIN) 500 MG tablet Take 1 tablet (500 mg total) by mouth daily.  . collagenase (SANTYL) ointment Apply 1 application topically daily.  . cycloSPORINE (RESTASIS) 0.05 % ophthalmic emulsion Place 1 drop into both eyes daily.  . furosemide (LASIX) 80 MG tablet Take 1 tablet (80 mg total) by mouth daily.  Marland Kitchen gabapentin (NEURONTIN) 100 MG capsule Take 100 mg by mouth 2 (two) times daily.  . hydroxypropyl methylcellulose / hypromellose (GONIOVISC)  2.5 % ophthalmic solution Place 1 drop into both eyes daily.  . metolazone (ZAROXOLYN) 5 MG tablet Take 1 tablet (5 mg total) by mouth every Monday, Wednesday, and Friday. One hour before AM furosemide - generic Lasix  . nitroGLYCERIN (NITROSTAT) 0.4 MG SL tablet Place 0.4 mg under the tongue every 5 (five) minutes as needed for chest pain.  . potassium chloride (K-DUR,KLOR-CON) 10 MEQ tablet Take 1 tablet (10 mEq total) by mouth daily.  . silver sulfADIAZINE (SILVADENE) 1 % cream Apply topically 2 (two) times daily.  . traMADol (ULTRAM) 50 MG tablet Take 1/2 (25mg ) by mouth every 6 hours as needed for pain  . [DISCONTINUED] acetaminophen (TYLENOL) 500 MG tablet Take 500 mg by mouth every 6 (six) hours as needed for moderate pain or headache.   No facility-administered encounter medications on file as of 08/24/2017.     Review of Systems  Constitutional: Positive for fatigue.  Cardiovascular: Positive for leg swelling.  Skin: Positive for wound.  All other systems reviewed and are negative.   Vitals:   08/24/17 1155  BP: 128/70  Pulse: 65  Resp: 20  Temp: (!) 96.2 F (35.7 C)  TempSrc: Oral  SpO2: 94%  Weight: 142 lb (64.4 kg)  Height: 5\' 4"  (1.626 m)   Body mass index  is 24.37 kg/m. Physical Exam  Constitutional: She is oriented to person, place, and time. She appears well-developed and well-nourished.  Sitting in chair at bedside in NAD  HENT:  Mouth/Throat: Oropharynx is clear and moist. No oropharyngeal exudate.  MMM; no oral thrush  Eyes: Pupils are equal, round, and reactive to light. No scleral icterus.  Neck: Neck supple. Carotid bruit is not present. No tracheal deviation present. No thyromegaly present.  Cardiovascular: Normal rate, regular rhythm and intact distal pulses. Exam reveals gallop (s4). Exam reveals no friction rub.  Murmur (2/6 SEM) heard. +1 pitting BLE edema with calf TTP 2/2 wounds. dsg c/d/i b/l leg  Pulmonary/Chest: Effort normal and breath sounds normal. No stridor. No respiratory distress. She has no wheezes. She has no rales.  Abdominal: Soft. Normal appearance and bowel sounds are normal. She exhibits no distension and no mass. There is no hepatomegaly. There is no tenderness. There is no rigidity, no rebound and no guarding. No hernia.  obese  Lymphadenopathy:    She has no cervical adenopathy.  Neurological: She is alert and oriented to person, place, and time. She has normal reflexes.  Skin: Skin is warm and dry. No rash noted.  Psychiatric: She has a normal mood and affect. Her behavior is normal. Judgment and thought content normal.    Labs reviewed: Basic Metabolic Panel: Recent Labs    05/26/17 0600  08/13/17 0753 08/14/17 0540 08/16/17 0504 08/17/17 0811  NA 136   < > 138 135 134* 133*  K 3.9   < > 4.6 4.8 4.6 3.8  CL 100*   < > 101 100* 96* 93*  CO2 25   < > 23 23 23 25   GLUCOSE 74   < > 97 113* 120* 113*  BUN 53*   < > 67* 77* 68* 60*  CREATININE 1.94*   < > 2.73* 2.83* 1.83* 2.00*  CALCIUM 8.8*   < > 9.1 9.0 8.8* 8.5*  MG 2.2  --  2.0  --   --  1.9   < > = values in this interval not displayed.   Liver Function Tests: Recent Labs    05/28/17 0518 07/26/17 1326  08/11/17 1741  AST 20 22 18     ALT 9* 11* 10*  ALKPHOS 92 79 102  BILITOT 1.9* 3.9* 3.2*  PROT 6.7 7.9 8.2*  ALBUMIN 3.1* 3.5 3.4*   No results for input(s): LIPASE, AMYLASE in the last 8760 hours. No results for input(s): AMMONIA in the last 8760 hours. CBC: Recent Labs    05/21/17 1645  07/26/17 1326 08/11/17 1741 08/12/17 0656 08/14/17 0540  WBC 4.9   < > 6.5 8.8 8.3 6.3  NEUTROABS 2.4  --  4.4 7.1  --   --   HGB 14.9   < > 14.8 15.1* 13.3 12.7  HCT 45.1   < > 45.6 45.6 41.0 37.2  MCV 81.6   < > 83.1 81.7 82.2 81.0  PLT 199   < > 194 197 193 216   < > = values in this interval not displayed.   Cardiac Enzymes: Recent Labs    05/21/17 1645 05/21/17 2247 05/22/17 0350  TROPONINI 0.08* 0.07* 0.07*   BNP: Invalid input(s): POCBNP Lab Results  Component Value Date   HGBA1C 6.3 (H) 03/11/2017   Lab Results  Component Value Date   TSH 2.961 02/01/2015   No results found for: VITAMINB12 No results found for: FOLATE No results found for: IRON, TIBC, FERRITIN  Imaging and Procedures obtained prior to SNF admission: No results found.  Assessment/Plan   ICD-10-CM   1. Bilateral cellulitis of lower leg L03.116    L03.115   2. Biventricular heart failure, NYHA class 2 (HCC) I50.82   3. Stage 4 chronic renal impairment associated with type 2 diabetes mellitus (HCC) E11.22    N18.4     F/u with cardio Tanya Algie Coffer  Cont current meds as ordered  PT/OT/ST as ordered  GOAL: short term rehab and d/c home when medically appropriate. Communicated with pt and nursing.  Will follow  Labs/tests ordered: none    Metta Koranda S. Ancil Linsey  [facility] [address] [place], [place] [address] [phone] Cell (Monday-Friday 8 AM - 5 PM) [phone] After 5 PM and follow prompts

## 2017-08-29 ENCOUNTER — Non-Acute Institutional Stay (SKILLED_NURSING_FACILITY): Payer: Medicare HMO | Admitting: Adult Health

## 2017-08-29 ENCOUNTER — Encounter: Payer: Self-pay | Admitting: Adult Health

## 2017-08-29 DIAGNOSIS — I5082 Biventricular heart failure: Secondary | ICD-10-CM | POA: Diagnosis not present

## 2017-08-29 DIAGNOSIS — I129 Hypertensive chronic kidney disease with stage 1 through stage 4 chronic kidney disease, or unspecified chronic kidney disease: Secondary | ICD-10-CM

## 2017-08-29 DIAGNOSIS — L03116 Cellulitis of left lower limb: Secondary | ICD-10-CM

## 2017-08-29 DIAGNOSIS — E1122 Type 2 diabetes mellitus with diabetic chronic kidney disease: Secondary | ICD-10-CM | POA: Diagnosis not present

## 2017-08-29 DIAGNOSIS — N184 Chronic kidney disease, stage 4 (severe): Secondary | ICD-10-CM

## 2017-08-29 DIAGNOSIS — L03115 Cellulitis of right lower limb: Secondary | ICD-10-CM

## 2017-08-29 NOTE — Progress Notes (Signed)
[name] Room Number: 119 Place of Service:  SNF (31)    CODE STATUS: full code   Allergies  Allergen Reactions  . Bidil [Isosorb Dinitrate-Hydralazine] Itching and Other (See Comments)    Jittery, feels like something is crawling    Chief Complaint  Patient presents with  . Discharge Note    HPI:  She is being discharged to home with home health for pt/ot/rn. She will need a standard wheelchair; 3:1 commode. She will need her prescriptions written and will need to follow up with her medical provider.  She had been hospitalized for bilateral lower extremity cellulitis and biventricular heart failure. She was admitted to this facility for short term rehab and wound management. She is now ready to return back home.   Past Medical History:  Diagnosis Date  . Biventricular heart failure (HCC)    systolic/notes 07/25/2016  . CHF (congestive heart failure) (HCC)   . Coronary artery disease   . Diet-controlled type 2 diabetes mellitus (HCC)   . GERD (gastroesophageal reflux disease)   . Gout   . High cholesterol   . History of gout   . Hypertension   . Shortness of breath dyspnea   . Stomach ulcer     Past Surgical History:  Procedure Laterality Date  . CARDIAC CATHETERIZATION  2014  . CORONARY ANGIOPLASTY WITH STENT PLACEMENT  2013  . LEFT AND RIGHT HEART CATHETERIZATION WITH CORONARY ANGIOGRAM N/A 11/25/2011   Procedure: LEFT AND RIGHT HEART CATHETERIZATION WITH CORONARY ANGIOGRAM;  Surgeon: Robynn Pane, MD;  Location: [facility] CATH LAB;  Service: Cardiovascular;  Laterality: N/A;  . LEFT AND RIGHT HEART CATHETERIZATION WITH CORONARY ANGIOGRAM N/A 09/07/2012   Procedure: LEFT AND RIGHT HEART CATHETERIZATION WITH CORONARY ANGIOGRAM;  Surgeon: [name], MD;  Location: [facility] CATH LAB;  Service: Cardiovascular;  Laterality: N/A;  . PERCUTANEOUS CORONARY STENT INTERVENTION (PCI-S) Right 11/25/2011   Procedure: PERCUTANEOUS CORONARY STENT  INTERVENTION (PCI-S);  Surgeon: Robynn Pane, MD;  Location: [facility] CATH LAB;  Service: Cardiovascular;  Laterality: Right;  . RIGHT/LEFT HEART CATH AND CORONARY ANGIOGRAPHY N/A 09/30/2016   Procedure: Right/Left Heart Cath and Coronary Angiography;  Surgeon: [name], MD;  Location: [facility] INVASIVE CV LAB;  Service: Cardiovascular;  Laterality: N/A;  . TONSILLECTOMY  1950's    Social History   Socioeconomic History  . Marital status: Married    Spouse name: Not on file  . Number of children: Not on file  . Years of education: Not on file  . Highest education level: Not on file  Social Needs  . Financial resource strain: Not on file  . Food insecurity - worry: Not on file  . Food insecurity - inability: Not on file  . Transportation needs - medical: Not on file  . Transportation needs - non-medical: Not on file  Occupational History  . Not on file  Tobacco Use  . Smoking status: Former Smoker    Packs/day: 0.50    Years: 20.00    Pack years: 10.00    Types: Cigarettes  . Smokeless tobacco: Never Used  . Tobacco comment: quit smoking in the 80's  Substance and Sexual Activity  . Alcohol use: No  . Drug use: No  . Sexual activity: No  Other Topics Concern  . Not on file  Social History Narrative  . Not on file   History reviewed. No pertinent family history.  VITAL SIGNS BP (!) 146/86   Pulse  65   Temp (!) 97 F (36.1 C)   Resp 20   Ht 5\' 4"  (1.626 m)   Wt 142 lb (64.4 kg)   SpO2 98%   BMI 24.37 kg/m    Patient's Medications  New Prescriptions   No medications on file  Previous Medications   ALBUTEROL (PROVENTIL HFA;VENTOLIN HFA) 108 (90 BASE) MCG/ACT INHALER    Inhale 2 puffs into the lungs every 6 (six) hours as needed for wheezing or shortness of breath.   ALLOPURINOL (ZYLOPRIM) 100 MG TABLET    Take 1 tablet (100 mg total) by mouth daily.   ASPIRIN EC 81 MG TABLET    Take 81 mg by mouth daily.   CARVEDILOL (COREG) 6.25 MG TABLET    Take 1 tablet (6.25 mg  total) by mouth 2 (two) times daily with a meal.   CEFUROXIME (CEFTIN) 500 MG TABLET    Take 1 tablet (500 mg total) by mouth daily.   COLLAGENASE (SANTYL) OINTMENT    Apply 1 application topically daily.   CYCLOSPORINE (RESTASIS) 0.05 % OPHTHALMIC EMULSION    Place 1 drop into both eyes daily.   FUROSEMIDE (LASIX) 80 MG TABLET    Take 1 tablet (80 mg total) by mouth daily.   GABAPENTIN (NEURONTIN) 100 MG CAPSULE    Take 100 mg by mouth 2 (two) times daily.   HYDROXYPROPYL METHYLCELLULOSE / HYPROMELLOSE (GONIOVISC) 2.5 % OPHTHALMIC SOLUTION    Place 1 drop into both eyes daily.   METOLAZONE (ZAROXOLYN) 5 MG TABLET    Take 1 tablet (5 mg total) by mouth every Monday, Wednesday, and Friday. One hour before AM furosemide - generic Lasix   NITROGLYCERIN (NITROSTAT) 0.4 MG SL TABLET    Place 0.4 mg under the tongue every 5 (five) minutes as needed for chest pain.   POTASSIUM CHLORIDE (K-DUR,KLOR-CON) 10 MEQ TABLET    Take 1 tablet (10 mEq total) by mouth daily.   SILVER SULFADIAZINE (SILVADENE) 1 % CREAM    Apply topically 2 (two) times daily.   TRAMADOL (ULTRAM) 50 MG TABLET    Take 1/2 (25mg ) by mouth every 6 hours as needed for pain  Modified Medications   No medications on file  Discontinued Medications   No medications on file     SIGNIFICANT DIAGNOSTIC EXAMS  PREVIOUS:   07-26-17: chest x-ray: Cardiomegaly with mild central congestion. No edema or pleural effusion.   08-17-17: bilateral ABI:  Right: Unable to obtain ABIs due to ulcerations and pain. There is no obvious evidence of a significant high grade stenosis. Velocities and Doppler waveforms diminsh moving distally down the leg. Left: Unable to obtian ABIs due to ulcerations and pain. There is no obvious evidence of a high grade stenosis. Velocities and Dopper waveforms diminish moving distally down the leg  NO NEW EXAMS.   LABS REVIEWED: PREVIOUS:   08-11-17: wbc 8.8; hgb 15.1; hct 45.6; mcv 81.7 plt 197; glucose 151; bun 56;  creat 1.78; k+ 2.7; na++ 141; ca 9.0; total bili 3.2; albumin 3.4; blood culture: no growth 08-12-17: wbc 8.3; hgb 13.3; hct 41.0; mcv 82.2; plt 193; glucose 118; bun 60; creat 1.95; k+ 3.9; na++141; ca 8.7 08-13-17: glucose 97; bun 67; creat 2.73; k+ 4.6; na++ 138; ca 9.1; mag 2.0 08-14-17: BNP 2371.0 08-17-17: glucose 113; bun 60; creat 2.00; k+ 3.8; na++ 133; ca 8.5; mag 1.9  NO NEW LABS    Review of Systems  Constitutional: Negative for malaise/fatigue.  Respiratory: Negative for cough and  shortness of breath.   Cardiovascular: Negative for chest pain, palpitations and leg swelling.  Gastrointestinal: Negative for abdominal pain, constipation and heartburn.  Musculoskeletal: Negative for back pain, joint pain and myalgias.  Skin:       Has sores on legs   Neurological: Negative for dizziness.  Psychiatric/Behavioral: The patient is not nervous/anxious.     Physical Exam  Constitutional: She is oriented to person, place, and time. She appears well-developed and well-nourished. No distress.  Frail   Neck: No thyromegaly present.  Cardiovascular: Normal rate, regular rhythm and intact distal pulses.  Murmur heard. 2/6  Pulmonary/Chest: Effort normal and breath sounds normal. No respiratory distress.  Abdominal: Soft. Bowel sounds are normal. She exhibits no distension. There is no tenderness.  Musculoskeletal: Normal range of motion. She exhibits no edema.  Lymphadenopathy:    She has no cervical adenopathy.  Neurological: She is alert and oriented to person, place, and time.  Skin: Skin is warm and dry. She is not diaphoretic.   Venous Right medial distal calf: 1.5 x 0.3 x 0.1 cm Venous: left anterior shin: 6.7 x 5.2 x NM cm  Venous: left distal medial calf: 8.5 x 8.5 x NM cm  Venous: ldisal lateral shin: 2.0 x 1.0 x Nm cm   Psychiatric: She has a normal mood and affect.    ASSESSMENT/ PLAN:  Patient is being discharged with the following home health services:  Pt/ot/rn: to  evaluate and treat as indicated for gait balance strength adl training medication and wound management.   Patient is being discharged with the following durable medical equipment:  3:1 commode. Standard wheelchair to allow her to maintain her current level of independence with her adls which cannot be achieved with a walker. Can self propel.  Patient has been advised to f/u with their PCP in 1-2 weeks to bring them up to date on their rehab stay.  Social services at facility was responsible for arranging this appointment.  Pt was provided with a 30 day supply of prescriptions for medications and refills must be obtained from their PCP.  For controlled substances, a more limited supply may be provided adequate until PCP appointment only.  A 30 days supply of her medications have been written per the medications list as above Narcotic written as follows Ultram 25 mg #20 tabs  Time spent with patient: 45 minutes: discussed home health needs; expectations; and care needed. Discussed her needed dme; and medications verbalized understanding.     Synthia Innocent NP [facility]  Contact [phone] Monday through Friday 8am- 5pm  After hours call [phone]

## 2017-09-24 ENCOUNTER — Other Ambulatory Visit: Payer: Self-pay | Admitting: Adult Health

## 2017-09-25 ENCOUNTER — Other Ambulatory Visit: Payer: Self-pay | Admitting: Adult Health

## 2017-10-09 DIAGNOSIS — I5082 Biventricular heart failure: Secondary | ICD-10-CM

## 2017-10-09 HISTORY — DX: Biventricular heart failure: I50.82

## 2017-10-23 ENCOUNTER — Other Ambulatory Visit: Payer: Self-pay

## 2017-10-23 ENCOUNTER — Inpatient Hospital Stay (HOSPITAL_COMMUNITY)
Admission: AD | Admit: 2017-10-23 | Discharge: 2017-11-08 | DRG: 291 | Disposition: E | Payer: Medicare HMO | Source: Ambulatory Visit | Attending: Critical Care Medicine | Admitting: Critical Care Medicine

## 2017-10-23 ENCOUNTER — Encounter (HOSPITAL_COMMUNITY): Payer: Self-pay | Admitting: General Practice

## 2017-10-23 DIAGNOSIS — I509 Heart failure, unspecified: Secondary | ICD-10-CM

## 2017-10-23 DIAGNOSIS — I2510 Atherosclerotic heart disease of native coronary artery without angina pectoris: Secondary | ICD-10-CM | POA: Diagnosis present

## 2017-10-23 DIAGNOSIS — D696 Thrombocytopenia, unspecified: Secondary | ICD-10-CM | POA: Diagnosis not present

## 2017-10-23 DIAGNOSIS — J9690 Respiratory failure, unspecified, unspecified whether with hypoxia or hypercapnia: Secondary | ICD-10-CM

## 2017-10-23 DIAGNOSIS — M797 Fibromyalgia: Secondary | ICD-10-CM | POA: Diagnosis present

## 2017-10-23 DIAGNOSIS — I10 Essential (primary) hypertension: Secondary | ICD-10-CM | POA: Diagnosis present

## 2017-10-23 DIAGNOSIS — N179 Acute kidney failure, unspecified: Secondary | ICD-10-CM | POA: Diagnosis not present

## 2017-10-23 DIAGNOSIS — I2729 Other secondary pulmonary hypertension: Secondary | ICD-10-CM | POA: Diagnosis not present

## 2017-10-23 DIAGNOSIS — I5023 Acute on chronic systolic (congestive) heart failure: Secondary | ICD-10-CM | POA: Diagnosis present

## 2017-10-23 DIAGNOSIS — J9602 Acute respiratory failure with hypercapnia: Secondary | ICD-10-CM | POA: Diagnosis not present

## 2017-10-23 DIAGNOSIS — I472 Ventricular tachycardia: Secondary | ICD-10-CM | POA: Diagnosis present

## 2017-10-23 DIAGNOSIS — I4891 Unspecified atrial fibrillation: Secondary | ICD-10-CM | POA: Diagnosis present

## 2017-10-23 DIAGNOSIS — Z789 Other specified health status: Secondary | ICD-10-CM

## 2017-10-23 DIAGNOSIS — S270XXA Traumatic pneumothorax, initial encounter: Secondary | ICD-10-CM | POA: Diagnosis not present

## 2017-10-23 DIAGNOSIS — M109 Gout, unspecified: Secondary | ICD-10-CM | POA: Diagnosis present

## 2017-10-23 DIAGNOSIS — J95811 Postprocedural pneumothorax: Secondary | ICD-10-CM | POA: Diagnosis present

## 2017-10-23 DIAGNOSIS — N183 Chronic kidney disease, stage 3 (moderate): Secondary | ICD-10-CM | POA: Diagnosis present

## 2017-10-23 DIAGNOSIS — J9600 Acute respiratory failure, unspecified whether with hypoxia or hypercapnia: Secondary | ICD-10-CM

## 2017-10-23 DIAGNOSIS — E440 Moderate protein-calorie malnutrition: Secondary | ICD-10-CM | POA: Diagnosis present

## 2017-10-23 DIAGNOSIS — R0600 Dyspnea, unspecified: Secondary | ICD-10-CM

## 2017-10-23 DIAGNOSIS — G9340 Encephalopathy, unspecified: Secondary | ICD-10-CM

## 2017-10-23 DIAGNOSIS — J690 Pneumonitis due to inhalation of food and vomit: Secondary | ICD-10-CM | POA: Diagnosis not present

## 2017-10-23 DIAGNOSIS — I081 Rheumatic disorders of both mitral and tricuspid valves: Secondary | ICD-10-CM | POA: Diagnosis present

## 2017-10-23 DIAGNOSIS — I5082 Biventricular heart failure: Secondary | ICD-10-CM | POA: Diagnosis present

## 2017-10-23 DIAGNOSIS — Z973 Presence of spectacles and contact lenses: Secondary | ICD-10-CM

## 2017-10-23 DIAGNOSIS — J441 Chronic obstructive pulmonary disease with (acute) exacerbation: Secondary | ICD-10-CM | POA: Diagnosis not present

## 2017-10-23 DIAGNOSIS — R0602 Shortness of breath: Secondary | ICD-10-CM | POA: Diagnosis present

## 2017-10-23 DIAGNOSIS — Z79899 Other long term (current) drug therapy: Secondary | ICD-10-CM

## 2017-10-23 DIAGNOSIS — Z955 Presence of coronary angioplasty implant and graft: Secondary | ICD-10-CM

## 2017-10-23 DIAGNOSIS — J449 Chronic obstructive pulmonary disease, unspecified: Secondary | ICD-10-CM | POA: Diagnosis present

## 2017-10-23 DIAGNOSIS — Z6827 Body mass index (BMI) 27.0-27.9, adult: Secondary | ICD-10-CM

## 2017-10-23 DIAGNOSIS — Z885 Allergy status to narcotic agent status: Secondary | ICD-10-CM

## 2017-10-23 DIAGNOSIS — Z452 Encounter for adjustment and management of vascular access device: Secondary | ICD-10-CM

## 2017-10-23 DIAGNOSIS — R579 Shock, unspecified: Secondary | ICD-10-CM | POA: Diagnosis not present

## 2017-10-23 DIAGNOSIS — E1122 Type 2 diabetes mellitus with diabetic chronic kidney disease: Secondary | ICD-10-CM | POA: Diagnosis present

## 2017-10-23 DIAGNOSIS — I130 Hypertensive heart and chronic kidney disease with heart failure and stage 1 through stage 4 chronic kidney disease, or unspecified chronic kidney disease: Principal | ICD-10-CM | POA: Diagnosis present

## 2017-10-23 DIAGNOSIS — Z87891 Personal history of nicotine dependence: Secondary | ICD-10-CM

## 2017-10-23 DIAGNOSIS — I469 Cardiac arrest, cause unspecified: Secondary | ICD-10-CM | POA: Diagnosis not present

## 2017-10-23 DIAGNOSIS — G931 Anoxic brain damage, not elsewhere classified: Secondary | ICD-10-CM | POA: Diagnosis not present

## 2017-10-23 DIAGNOSIS — E1129 Type 2 diabetes mellitus with other diabetic kidney complication: Secondary | ICD-10-CM | POA: Diagnosis present

## 2017-10-23 DIAGNOSIS — R011 Cardiac murmur, unspecified: Secondary | ICD-10-CM | POA: Diagnosis present

## 2017-10-23 DIAGNOSIS — J9601 Acute respiratory failure with hypoxia: Secondary | ICD-10-CM | POA: Diagnosis not present

## 2017-10-23 DIAGNOSIS — Z7982 Long term (current) use of aspirin: Secondary | ICD-10-CM

## 2017-10-23 DIAGNOSIS — L03116 Cellulitis of left lower limb: Secondary | ICD-10-CM | POA: Diagnosis present

## 2017-10-23 DIAGNOSIS — L03115 Cellulitis of right lower limb: Secondary | ICD-10-CM | POA: Diagnosis present

## 2017-10-23 DIAGNOSIS — N184 Chronic kidney disease, stage 4 (severe): Secondary | ICD-10-CM | POA: Diagnosis not present

## 2017-10-23 DIAGNOSIS — E872 Acidosis: Secondary | ICD-10-CM | POA: Diagnosis not present

## 2017-10-23 LAB — COMPREHENSIVE METABOLIC PANEL
ALK PHOS: 71 U/L (ref 38–126)
ALT: 10 U/L — AB (ref 14–54)
ANION GAP: 17 — AB (ref 5–15)
AST: 17 U/L (ref 15–41)
Albumin: 3.5 g/dL (ref 3.5–5.0)
BUN: 63 mg/dL — ABNORMAL HIGH (ref 6–20)
CALCIUM: 8.9 mg/dL (ref 8.9–10.3)
CO2: 15 mmol/L — ABNORMAL LOW (ref 22–32)
CREATININE: 2.34 mg/dL — AB (ref 0.44–1.00)
Chloride: 108 mmol/L (ref 101–111)
GFR, EST AFRICAN AMERICAN: 24 mL/min — AB (ref >60)
GFR, EST NON AFRICAN AMERICAN: 21 mL/min — AB (ref >60)
Glucose, Bld: 90 mg/dL (ref 65–99)
Potassium: 3.6 mmol/L (ref 3.5–5.1)
Sodium: 140 mmol/L (ref 135–145)
TOTAL PROTEIN: 7.7 g/dL (ref 6.5–8.1)
Total Bilirubin: 3 mg/dL — ABNORMAL HIGH (ref 0.3–1.2)

## 2017-10-23 LAB — CBC WITH DIFFERENTIAL/PLATELET
Basophils Absolute: 0 10*3/uL (ref 0.0–0.1)
Basophils Relative: 0 %
EOS PCT: 0 %
Eosinophils Absolute: 0 10*3/uL (ref 0.0–0.7)
HCT: 45.6 % (ref 36.0–46.0)
HEMOGLOBIN: 15.5 g/dL — AB (ref 12.0–15.0)
LYMPHS ABS: 1.4 10*3/uL (ref 0.7–4.0)
LYMPHS PCT: 30 %
MCH: 26.7 pg (ref 26.0–34.0)
MCHC: 34 g/dL (ref 30.0–36.0)
MCV: 78.5 fL (ref 78.0–100.0)
MONOS PCT: 8 %
Monocytes Absolute: 0.4 10*3/uL (ref 0.1–1.0)
Neutro Abs: 2.8 10*3/uL (ref 1.7–7.7)
Neutrophils Relative %: 62 %
PLATELETS: 167 10*3/uL (ref 150–400)
RBC: 5.81 MIL/uL — ABNORMAL HIGH (ref 3.87–5.11)
RDW: 18.3 % — ABNORMAL HIGH (ref 11.5–15.5)
WBC: 4.6 10*3/uL (ref 4.0–10.5)

## 2017-10-23 LAB — GLUCOSE, CAPILLARY: Glucose-Capillary: 76 mg/dL (ref 65–99)

## 2017-10-23 LAB — TROPONIN I
TROPONIN I: 0.04 ng/mL — AB (ref <0.03)
Troponin I: 0.05 ng/mL (ref <0.03)

## 2017-10-23 LAB — BRAIN NATRIURETIC PEPTIDE: B NATRIURETIC PEPTIDE 5: 3650.4 pg/mL — AB (ref 0.0–100.0)

## 2017-10-23 MED ORDER — HEPARIN SODIUM (PORCINE) 5000 UNIT/ML IJ SOLN
5000.0000 [IU] | Freq: Three times a day (TID) | INTRAMUSCULAR | Status: DC
  Administered 2017-10-23 – 2017-10-24 (×4): 5000 [IU] via SUBCUTANEOUS
  Filled 2017-10-23 (×4): qty 1

## 2017-10-23 MED ORDER — SODIUM CHLORIDE 0.9 % IV SOLN: 250.0000 mL | INTRAVENOUS | Status: DC | PRN

## 2017-10-23 MED ORDER — SODIUM CHLORIDE 0.9% FLUSH: 3.0000 mL | INTRAVENOUS | Status: DC | PRN

## 2017-10-23 MED ORDER — COLLAGENASE 250 UNIT/GM EX OINT
TOPICAL_OINTMENT | Freq: Every day | CUTANEOUS | Status: DC
  Administered 2017-10-23 – 2017-10-24 (×2): via TOPICAL
  Administered 2017-10-25: 1 via TOPICAL
  Filled 2017-10-23 (×3): qty 30

## 2017-10-23 MED ORDER — ACETAMINOPHEN 325 MG PO TABS: 650.0000 mg | ORAL_TABLET | ORAL | Status: DC | PRN

## 2017-10-23 MED ORDER — SODIUM CHLORIDE 0.9% FLUSH
3.0000 mL | Freq: Two times a day (BID) | INTRAVENOUS | Status: DC
  Administered 2017-10-23 – 2017-10-24 (×3): 3 mL via INTRAVENOUS

## 2017-10-23 MED ORDER — ONDANSETRON HCL 4 MG/2ML IJ SOLN: 4.0000 mg | Freq: Four times a day (QID) | INTRAMUSCULAR | Status: DC | PRN

## 2017-10-23 MED ORDER — FUROSEMIDE 10 MG/ML IJ SOLN
40.0000 mg | Freq: Two times a day (BID) | INTRAMUSCULAR | Status: DC
  Administered 2017-10-23 – 2017-10-24 (×3): 40 mg via INTRAVENOUS
  Filled 2017-10-23 (×4): qty 4

## 2017-10-23 NOTE — Progress Notes (Signed)
RN applied dressing to pt's left leg and foam to pt's right leg per order.

## 2017-10-23 NOTE — Progress Notes (Signed)
CRITICAL VALUE ALERT  Critical Value:  Troponin= 0.04  Date & Time Notied:  2018-01-11 1435  Provider Notified: yes  Orders Received/Actions taken: No new orders. MD is aware. Pt is asymptomatic.

## 2017-10-23 NOTE — Plan of Care (Signed)
  Problem: Clinical Measurements: Goal: Ability to maintain clinical measurements within normal limits will improve Outcome: Progressing   Problem: Clinical Measurements: Goal: Diagnostic test results will improve Outcome: Progressing   Problem: Clinical Measurements: Goal: Cardiovascular complication will be avoided Outcome: Progressing   

## 2017-10-23 NOTE — Consult Note (Signed)
WOC Nurse wound consult note Reason for Consult: Bilateral lower leg wounds Wound type: Appearance lends these wounds to arterial due to the punched out appearance, bilateral leg swelling (left greater than right) and weeping of right lower leg suggests venous stasis.  Comparing what the wounds appear like today to the photos from January 16 of this year, they are SIGNIFICANTLY worse.  The right lower leg just below the knee has two open areas that are separated by a skin [place] and are consistent with ruptured bullae.  These areas are weeping.  Please cover these with a foam dressing.   Just above the right medial malleolus is a wound that measure 2 cm x 2 cm x 0.3 cm at its deepest observable and is   The left lower leg has 3 wounds.   The anterior lower leg has a wound that measures 9 cm x 7 cm x unknown depth and is highly necrotic.  The left lower medial leg has a wound that measures 3 cm x 4 cm x unknown depth, and is also highly necrotic.   The left lateral ankle area has a wound that is 2.5 cm x 4 cm x 1 cm, also necrotic.  Topical wound care for the wounds with slough will include daily application of Santyl, cover with saline moistened gauze, then dry gauze or ABD pad.  Change daily. Ruptured bullae can be covered with foam dressings and change every 2 days and prn soilage. PLEASE CONSIDER CONSULTING ORTHOPEDIC SURGERY for evaluation and potential surgical intervention of lower legs, especially the left one. Thank you for the consult.  Discussed plan of care with the patient and bedside nurse.  WOC nurse will not follow at this time.  Please re-consult the WOC team if needed.  Helmut MusterSherry Evia Goldsmith, RN, MSN, CWOCN, CNS-BC, pager [phone]

## 2017-10-23 NOTE — H&P (Signed)
Referring Physician:  [name] is an 67 y.o. female.                       Chief Complaint: Leg edema, shortness of breath.  HPI: 67 year old female has increasing leg edema, shortness of breath and worsening of wound in her left leg.  She has past medical history of biventricular heart failure, coronary artery disease, gout, hyper lipidemia, hypertension and type 2 diabetes mellitus.  Patient denies fever chills and chest pain.  Past Medical History:  Diagnosis Date  . Biventricular heart failure (HCC)    systolic/notes 07/25/2016  . CHF (congestive heart failure) (HCC)   . Coronary artery disease   . Diet-controlled type 2 diabetes mellitus (HCC)   . GERD (gastroesophageal reflux disease)   . Gout   . High cholesterol   . History of gout   . Hypertension   . Shortness of breath dyspnea   . Stomach ulcer       Past Surgical History:  Procedure Laterality Date  . CARDIAC CATHETERIZATION  2014  . CORONARY ANGIOPLASTY WITH STENT PLACEMENT  2013  . LEFT AND RIGHT HEART CATHETERIZATION WITH CORONARY ANGIOGRAM N/A 11/25/2011   Procedure: LEFT AND RIGHT HEART CATHETERIZATION WITH CORONARY ANGIOGRAM;  Surgeon: Robynn Pane, MD;  Location: [facility] CATH LAB;  Service: Cardiovascular;  Laterality: N/A;  . LEFT AND RIGHT HEART CATHETERIZATION WITH CORONARY ANGIOGRAM N/A 09/07/2012   Procedure: LEFT AND RIGHT HEART CATHETERIZATION WITH CORONARY ANGIOGRAM;  Surgeon: [name], MD;  Location: [facility] CATH LAB;  Service: Cardiovascular;  Laterality: N/A;  . PERCUTANEOUS CORONARY STENT INTERVENTION (PCI-S) Right 11/25/2011   Procedure: PERCUTANEOUS CORONARY STENT INTERVENTION (PCI-S);  Surgeon: Robynn Pane, MD;  Location: [facility] CATH LAB;  Service: Cardiovascular;  Laterality: Right;  . RIGHT/LEFT HEART CATH AND CORONARY ANGIOGRAPHY N/A 09/30/2016   Procedure: Right/Left Heart Cath and Coronary Angiography;  Surgeon: [name], MD;  Location: [facility] INVASIVE CV LAB;  Service: Cardiovascular;   Laterality: N/A;  . TONSILLECTOMY  1950's    No family history on file. Social History:  reports that she has quit smoking. Her smoking use included cigarettes. She has a 10.00 pack-year smoking history. She has never used smokeless tobacco. She reports that she does not drink alcohol or use drugs.  Allergies:  Allergies  Allergen Reactions  . Bidil [Isosorb Dinitrate-Hydralazine] Itching and Other (See Comments)    Jittery, feels like something is crawling    Medications Prior to Admission  Medication Sig Dispense Refill  . albuterol (PROVENTIL HFA;VENTOLIN HFA) 108 (90 Base) MCG/ACT inhaler Inhale 2 puffs into the lungs every 6 (six) hours as needed for wheezing or shortness of breath.    . allopurinol (ZYLOPRIM) 100 MG tablet Take 1 tablet (100 mg total) by mouth daily.    Marland Kitchen aspirin EC 81 MG tablet Take 81 mg by mouth daily.    . carvedilol (COREG) 6.25 MG tablet Take 1 tablet (6.25 mg total) by mouth 2 (two) times daily with a meal. 60 tablet 3  . cefUROXime (CEFTIN) 500 MG tablet Take 1 tablet (500 mg total) by mouth daily. 7 tablet 0  . collagenase (SANTYL) ointment Apply 1 application topically daily.    . cycloSPORINE (RESTASIS) 0.05 % ophthalmic emulsion Place 1 drop into both eyes daily.    . furosemide (LASIX) 80 MG tablet Take 1 tablet (80 mg total) by mouth daily. 30 tablet 1  . gabapentin (NEURONTIN) 100 MG capsule  Take 100 mg by mouth 2 (two) times daily.    . hydroxypropyl methylcellulose / hypromellose (GONIOVISC) 2.5 % ophthalmic solution Place 1 drop into both eyes daily.    . metolazone (ZAROXOLYN) 5 MG tablet Take 1 tablet (5 mg total) by mouth every Monday, Wednesday, and Friday. One hour before AM furosemide - generic Lasix 60 tablet 3  . nitroGLYCERIN (NITROSTAT) 0.4 MG SL tablet Place 0.4 mg under the tongue every 5 (five) minutes as needed for chest pain.    . potassium chloride (K-DUR,KLOR-CON) 10 MEQ tablet Take 1 tablet (10 mEq total) by mouth daily.    .  silver sulfADIAZINE (SILVADENE) 1 % cream Apply topically 2 (two) times daily. 400 g 0  . traMADol (ULTRAM) 50 MG tablet Take 1/2 (25mg ) by mouth every 6 hours as needed for pain      No results found for this or any previous visit (from the past 48 hour(s)). No results found.  Review Of Systems Constitutional: No fever, chills, weight loss or gain. Eyes: No vision change, wears glasses. No discharge or pain. Ears: No hearing loss, No tinnitus. Respiratory: No asthma, COPD, pneumonias.  Positive shortness of breath. No hemoptysis. Cardiovascular: Positive chest pain, palpitation, leg edema. Gastrointestinal: Positive nausea, vomiting, diarrhea, constipation. No GI bleed. No hepatitis. Genitourinary: No dysuria, hematuria, kidney stone. No incontinance. Neurological: No headache, stroke, seizures.  Psychiatry: No psych facility admission for anxiety, depression, suicide. No detox. Skin: No rash. Musculoskeletal: Positive joint pain, fibromyalgia. No neck pain, back pain. Lymphadenopathy: No lymphadenopathy. Hematology: No anemia or easy bruising.   Blood pressure (!) 145/86, pulse 80, temperature 97.8 F (36.6 C), temperature source Oral, resp. rate 18, height 5\' 4"  (1.626 m), weight 71.3 kg (157 lb 3.2 oz), SpO2 100 %. Body mass index is 26.98 kg/m. General appearance: alert, cooperative, appears stated age and no distress Head: Normocephalic, atraumatic. Eyes: Brown eyes, pink conjunctiva, corneas clear. PERRL, EOM's intact. Neck: No adenopathy, no carotid bruit, 2 + JVD, supple, symmetrical, trachea midline and thyroid not enlarged. Resp: Basal Crackles to auscultation bilaterally. Cardio: Regular rate and rhythm, S1, S2 normal, III/VI systolic murmur, no click, rub or gallop GI: Soft, non-tender; bowel sounds normal; no organomegaly. Extremities: 2 + edema up to thighs, no cyanosis or clubbing.  Venous stasis discoloration of both lower legs with superficial wounds. Skin: Warm  and dry.  Neurologic: Alert and oriented X 3, normal strength. Normal coordination and gait.  Assessment/Plan Acute on chronic bi-ventricular heart failure Hypertension CAD S/P LAD stent Type 2 DM Moderate protein calorie malnutrition Moderate MR Severe TR CKD, III  Admit. R/O MI. IV lasix. Wound care consult.  Ricki RodriguezAjay S Deaunna Olarte, MD  10/19/2017, 12:51 PM

## 2017-10-23 NOTE — Progress Notes (Signed)
Pt has arrived on unit. RN called MD for orders. MD states he will come see pt.

## 2017-10-23 NOTE — Consult Note (Signed)
WOC Nurse wound consult note Second consult for same wound care needs.  See my original note from today at 1:17 pm and the orders I have entered for topical therapy. Thank you for the consult.  Discussed plan of care with the patient and bedside nurse.  WOC nurse will not follow at this time.  Please re-consult the WOC team if needed.  Helmut MusterSherry Kemba Hoppes, RN, MSN, CWOCN, CNS-BC, pager [phone]

## 2017-10-23 NOTE — Progress Notes (Signed)
Rt lower leg wound redressed with kerlix and abds.

## 2017-10-24 ENCOUNTER — Inpatient Hospital Stay (HOSPITAL_COMMUNITY): Payer: Medicare HMO

## 2017-10-24 LAB — GLUCOSE, CAPILLARY: Glucose-Capillary: 157 mg/dL — ABNORMAL HIGH (ref 65–99)

## 2017-10-24 LAB — BASIC METABOLIC PANEL
Anion gap: 11 (ref 5–15)
BUN: 62 mg/dL — AB (ref 6–20)
CALCIUM: 8.3 mg/dL — AB (ref 8.9–10.3)
CO2: 17 mmol/L — ABNORMAL LOW (ref 22–32)
Chloride: 110 mmol/L (ref 101–111)
Creatinine, Ser: 2.3 mg/dL — ABNORMAL HIGH (ref 0.44–1.00)
GFR calc Af Amer: 24 mL/min — ABNORMAL LOW (ref >60)
GFR calc non Af Amer: 21 mL/min — ABNORMAL LOW (ref >60)
GLUCOSE: 111 mg/dL — AB (ref 65–99)
Potassium: 3.3 mmol/L — ABNORMAL LOW (ref 3.5–5.1)
SODIUM: 138 mmol/L (ref 135–145)

## 2017-10-24 LAB — TROPONIN I: TROPONIN I: 0.04 ng/mL — AB (ref <0.03)

## 2017-10-24 MED ORDER — ASPIRIN 81 MG PO CHEW: 324.0000 mg | CHEWABLE_TABLET | ORAL | Status: DC

## 2017-10-24 MED ORDER — SODIUM BICARBONATE 8.4 % IV SOLN
INTRAVENOUS | Status: AC
  Administered 2017-10-24: 100 meq
  Filled 2017-10-24: qty 100

## 2017-10-24 MED ORDER — ASPIRIN 300 MG RE SUPP: 300.0000 mg | RECTAL | Status: DC

## 2017-10-24 MED ORDER — DOPAMINE-DEXTROSE 3.2-5 MG/ML-% IV SOLN
5.0000 ug/kg/min | INTRAVENOUS | Status: DC
  Administered 2017-10-24: 5 ug/kg/min via INTRAVENOUS
  Administered 2017-10-25: 2.5 ug/kg/min via INTRAVENOUS
  Filled 2017-10-24 (×2): qty 250

## 2017-10-24 MED ORDER — FENTANYL 2500MCG IN NS 250ML (10MCG/ML) PREMIX INFUSION
25.0000 ug/h | INTRAVENOUS | Status: DC
  Administered 2017-10-25: 100 ug/h via INTRAVENOUS
  Filled 2017-10-24: qty 250

## 2017-10-24 MED ORDER — MIDAZOLAM HCL 2 MG/2ML IJ SOLN
1.0000 mg | INTRAMUSCULAR | Status: DC | PRN
  Administered 2017-10-25: 1 mg via INTRAVENOUS
  Filled 2017-10-24 (×2): qty 2

## 2017-10-24 MED ORDER — SODIUM BICARBONATE 8.4 % IV SOLN
INTRAVENOUS | Status: AC
  Administered 2017-10-24: 50 meq
  Filled 2017-10-24: qty 50

## 2017-10-24 MED ORDER — ACETAMINOPHEN 160 MG/5ML PO SOLN: 650.0000 mg | Freq: Four times a day (QID) | ORAL | Status: DC | PRN

## 2017-10-24 MED ORDER — ACETAMINOPHEN 650 MG RE SUPP: 650.0000 mg | Freq: Four times a day (QID) | RECTAL | Status: DC | PRN

## 2017-10-24 MED ORDER — HEPARIN SODIUM (PORCINE) 5000 UNIT/ML IJ SOLN: 5000.0000 [IU] | Freq: Three times a day (TID) | INTRAMUSCULAR | Status: DC

## 2017-10-24 MED ORDER — DOCUSATE SODIUM 50 MG/5ML PO LIQD: 100.0000 mg | Freq: Two times a day (BID) | ORAL | Status: DC | PRN

## 2017-10-24 MED ORDER — ORAL CARE MOUTH RINSE
15.0000 mL | Freq: Four times a day (QID) | OROMUCOSAL | Status: DC
  Administered 2017-10-25 (×3): 15 mL via OROMUCOSAL

## 2017-10-24 MED ORDER — ALBUTEROL SULFATE (2.5 MG/3ML) 0.083% IN NEBU
3.0000 mL | INHALATION_SOLUTION | Freq: Four times a day (QID) | RESPIRATORY_TRACT | Status: DC | PRN
  Administered 2017-10-24: 3 mL via RESPIRATORY_TRACT
  Filled 2017-10-24: qty 3

## 2017-10-24 MED ORDER — FENTANYL CITRATE (PF) 100 MCG/2ML IJ SOLN
50.0000 ug | Freq: Once | INTRAMUSCULAR | Status: AC
  Administered 2017-10-25: 50 ug via INTRAVENOUS
  Filled 2017-10-24: qty 2

## 2017-10-24 MED ORDER — FAMOTIDINE IN NACL 20-0.9 MG/50ML-% IV SOLN
20.0000 mg | Freq: Two times a day (BID) | INTRAVENOUS | Status: DC
  Administered 2017-10-25: 20 mg via INTRAVENOUS
  Filled 2017-10-24 (×2): qty 50

## 2017-10-24 MED ORDER — MIDAZOLAM HCL 2 MG/2ML IJ SOLN
1.0000 mg | INTRAMUSCULAR | Status: DC | PRN
  Administered 2017-10-25: 1 mg via INTRAVENOUS

## 2017-10-24 MED ORDER — HYDROCODONE-ACETAMINOPHEN 5-325 MG PO TABS
1.0000 | ORAL_TABLET | Freq: Four times a day (QID) | ORAL | Status: DC | PRN
  Administered 2017-10-24: 1 via ORAL
  Filled 2017-10-24: qty 1

## 2017-10-24 MED ORDER — IPRATROPIUM-ALBUTEROL 0.5-2.5 (3) MG/3ML IN SOLN
3.0000 mL | Freq: Four times a day (QID) | RESPIRATORY_TRACT | Status: DC
  Administered 2017-10-25 (×3): 3 mL via RESPIRATORY_TRACT
  Filled 2017-10-24 (×4): qty 3

## 2017-10-24 MED ORDER — INSULIN ASPART 100 UNIT/ML ~~LOC~~ SOLN
2.0000 [IU] | SUBCUTANEOUS | Status: DC
  Administered 2017-10-25 (×3): 2 [IU] via SUBCUTANEOUS

## 2017-10-24 MED ORDER — FENTANYL BOLUS VIA INFUSION
25.0000 ug | INTRAVENOUS | Status: DC | PRN
  Filled 2017-10-24: qty 25

## 2017-10-24 MED ORDER — SODIUM CHLORIDE 0.9 % IV SOLN: 250.0000 mL | INTRAVENOUS | Status: DC | PRN

## 2017-10-24 MED ORDER — CARVEDILOL 6.25 MG PO TABS
6.2500 mg | ORAL_TABLET | Freq: Two times a day (BID) | ORAL | Status: DC
  Filled 2017-10-24: qty 1

## 2017-10-24 MED ORDER — CHLORHEXIDINE GLUCONATE 0.12% ORAL RINSE (MEDLINE KIT)
15.0000 mL | Freq: Two times a day (BID) | OROMUCOSAL | Status: DC
  Administered 2017-10-25 (×3): 15 mL via OROMUCOSAL

## 2017-10-24 MED ORDER — ALLOPURINOL 100 MG PO TABS
100.0000 mg | ORAL_TABLET | Freq: Every day | ORAL | Status: DC
  Administered 2017-10-24: 100 mg via ORAL
  Filled 2017-10-24: qty 1

## 2017-10-24 MED ORDER — ASPIRIN EC 81 MG PO TBEC
81.0000 mg | DELAYED_RELEASE_TABLET | Freq: Every day | ORAL | Status: DC
  Administered 2017-10-24 – 2017-10-25 (×2): 81 mg via ORAL
  Filled 2017-10-24 (×2): qty 1

## 2017-10-24 NOTE — Progress Notes (Signed)
Pt resting in bed watching TV. No distress noted, no c/o pain. Call bell at side.

## 2017-10-24 NOTE — Consult Note (Signed)
PULMONARY / CRITICAL CARE MEDICINE   Name: [name] MRN: [id number] DOB: [birth year]    ADMISSION DATE:  10/24/2017 CONSULTATION DATE:  10/24/2017  REFERRING MD:  [name]  CHIEF COMPLAINT:  Cardiac arrest  HISTORY OF PRESENT ILLNESS:   [name] is a 67 year old female with a past medical history significant for biventricular heart failure, CAD, DM2, HTN, HLD, COPD who was admitted on 10/10/2017 to the cardiovascular service with progressive shortness of breath, LE edema, worsening leg wounds. BNP on admission was 3,650 up from 2,371 08/14/17. She was admitted for acute on chronic bi-ventricular heart failure exacerbation and diuresed with IV lasix. Minimal response to lasix with only 200 mL UOP documented. She was started on dopamine drip today, 4/16. This evening, she became hypoxic and rapid response was called. On rapid response arrived patient was hypoxic hypotensive, mottled and completely obtunded with agonal breathing. She subsequently went into PEA arrest. Code blue was called and she received 10 minutes of CPR with 3 rounds of epi, bicarb x1 before achieving ROSC. She had a wide complex tachycardia and was given a dose of amiodarone.   PAST MEDICAL HISTORY :  She  has a past medical history of Biventricular heart failure (HCC), Biventricular heart failure (HCC) (10/2017), CHF (congestive heart failure) (HCC), Coronary artery disease, Diet-controlled type 2 diabetes mellitus (HCC), GERD (gastroesophageal reflux disease), Gout, High cholesterol, History of gout, Hypertension, Shortness of breath dyspnea, and Stomach ulcer.  PAST SURGICAL HISTORY: She  has a past surgical history that includes left and right heart catheterization with coronary angiogram (N/A, 11/25/2011); percutaneous coronary stent intervention (pci-s) (Right, 11/25/2011); left and right heart catheterization with coronary angiogram (N/A, 09/07/2012); Tonsillectomy (1950's); Coronary angioplasty with stent (2013); Cardiac  catheterization (2014); and RIGHT/LEFT HEART CATH AND CORONARY ANGIOGRAPHY (N/A, 09/30/2016).  Allergies  Allergen Reactions  . Bidil [Isosorb Dinitrate-Hydralazine] Itching and Other (See Comments)    Jittery, feels like something is crawling    No current facility-administered medications on file prior to encounter.    Current Outpatient Medications on File Prior to Encounter  Medication Sig  . albuterol (PROVENTIL HFA;VENTOLIN HFA) 108 (90 Base) MCG/ACT inhaler Inhale 2 puffs into the lungs every 6 (six) hours as needed for wheezing or shortness of breath.  . allopurinol (ZYLOPRIM) 100 MG tablet Take 1 tablet (100 mg total) by mouth daily.  Marland Kitchen aspirin EC 81 MG tablet Take 81 mg by mouth daily.  . carvedilol (COREG) 6.25 MG tablet Take 1 tablet (6.25 mg total) by mouth 2 (two) times daily with a meal.  . furosemide (LASIX) 80 MG tablet Take 1 tablet (80 mg total) by mouth daily. (Patient not taking: Reported on 10/24/2017)  . potassium chloride (K-DUR,KLOR-CON) 10 MEQ tablet Take 1 tablet (10 mEq total) by mouth daily. (Patient not taking: Reported on 10/24/2017)    FAMILY HISTORY:  Her has no family status information on file.    SOCIAL HISTORY: She  reports that she has quit smoking. Her smoking use included cigarettes. She has a 10.00 pack-year smoking history. She has never used smokeless tobacco. She reports that she does not drink alcohol or use drugs.  REVIEW OF SYSTEMS:   Unable to obtaine  SUBJECTIVE:  Patient intubated   VITAL SIGNS: BP (!) 159/99 (BP Location: Left Arm)   Pulse (!) 120   Temp 98.6 F (37 C) (Oral)   Resp (!) 42   Ht 5\' 4"  (1.626 m)   Wt 156 lb 9.6 oz (71  kg)   SpO2 94%   BMI 26.88 kg/m   HEMODYNAMICS:    VENTILATOR SETTINGS:     INTAKE / OUTPUT: I/O last 3 completed shifts: In: 240 [P.O.:240] Out: 250 [Urine:250]  PHYSICAL EXAMINATION: General: Patient intubated, no respiratory distress Neuro:  Intubated, non-interactive HEENT:  atraumatic Cardiovascular:  RRR, peripheral pulses palpable Lungs:  Coarse, prolonged expiratory phase Abdomen: Soft, NT, ND, BS+ Musculoskeletal:  No deformites Skin: Bilateral LE wounds bandaged  LABS:  BMET Recent Labs  Lab 2017/11/12 1252 10/24/17 0256  NA 140 138  K 3.6 3.3*  CL 108 110  CO2 15* 17*  BUN 63* 62*  CREATININE 2.34* 2.30*  GLUCOSE 90 111*    Electrolytes Recent Labs  Lab 2017/11/12 1252 10/24/17 0256  CALCIUM 8.9 8.3*    CBC Recent Labs  Lab Nov 12, 2017 1252  WBC 4.6  HGB 15.5*  HCT 45.6  PLT 167    Coag's No results for input(s): APTT, INR in the last 168 hours.  Sepsis Markers No results for input(s): LATICACIDVEN, PROCALCITON, O2SATVEN in the last 168 hours.  ABG No results for input(s): PHART, PCO2ART, PO2ART in the last 168 hours.  Liver Enzymes Recent Labs  Lab 11/12/2017 1252  AST 17  ALT 10*  ALKPHOS 71  BILITOT 3.0*  ALBUMIN 3.5    Cardiac Enzymes Recent Labs  Lab 2017/11/12 1252 12-Nov-2017 1900 10/24/17 0256  TROPONINI 0.04* 0.05* 0.04*    Glucose Recent Labs  Lab 12-Nov-2017 1357  GLUCAP 76    Imaging Dg Chest 2 View  Result Date: 10/24/2017 CLINICAL DATA:  Congestive heart failure.  Follow-up. EXAM: CHEST - 2 VIEW COMPARISON:  07/26/2017 FINDINGS: Redemonstration of markedly enlarged cardiac silhouette. Chronic aortic atherosclerosis. Lungs are clear. Question tiny amount of pleural fluid on the left. Pulmonary vascularity does not show pronounced venous hypertension and no edema is present presently. IMPRESSION: Chronic cardiomegaly. Aortic atherosclerosis. Possible tiny amount of pleural fluid on the left. Electronically Signed   By: Paulina Fusi M.D.   On: 10/24/2017 08:31   STUDIES:  4/16 CXR: Chronic cardiomegaly. Aortic atherosclerosis. Possible tiny amount of pleural fluid on the left.  4/16 CXR: Endotracheal tube tip about 1 cm superior to carina. Esophageal tube tip below the diaphragm but non included.  Cardiomegaly with vascular congestion and mild diffuse ground-glass opacities suspicious for edema.  CULTURES: 4/16 Blood culture > 4/16 Tracheal aspirate >   ANTIBIOTICS: 4/16 Zosyn >  SIGNIFICANT EVENTS: 4/15 Admitted for heart failure exacerbation 4/16 Started on dopamine drip 4/16 PEA arrest > intubated   LINES/TUBES: 4/16 ETT >  DISCUSSION: 67 year old female with biventricular heart failure, CAD, COPD, DM who was admitted for heart failure exacerbation and placed on dopamine drip subsequent hypoxia and PEA arrest.  ASSESSMENT / PLAN:  PULMONARY A: Acute hypoxic hypercarbic respiratory failure > intubated. ABG 6.9/93/76/21.  ? Aspiration PNA   P:   Check PCT CXR in am Start Zosyn ABG Daily SBT assessment VAP protocol Duonebs  CARDIOVASCULAR A:  PEA arrest Biventricular HF Hx CAD  P:  ECHO Trend troponins Dopamine gtt Check cortisol, stress dose steroids CT Head EEG Follow lactate EKG  RENAL A:   AKI   P:   Follow UOP Follow BMET, electrolytes  GASTROINTESTINAL A:   None  P:   Pepcid IV  HEMATOLOGIC A:   None  P:  Follow CBC  INFECTIOUS A:   ? Aspiration PNA ? Infected wounds - wound care consult seen this hospitalization  P:  Zosyn BCx Tracheal aspirate PCT CXR  ENDOCRINE A:   DM2  P:   CBG q4hr SSI  NEUROLOGIC A:   Encephalopathy   P:   RASS goal: -1, -2 Fentanyl gtt CT head  FAMILY  - Updates:   - Inter-disciplinary family meet or Palliative Care meeting due by:  4/23    Pulmonary and Critical Care Medicine [facility] Pager: [phone]  10/24/2017, 11:40 PM

## 2017-10-24 NOTE — Progress Notes (Signed)
Ref: [name], Judd Mccubbin, MD   Subjective:  Respiratory distress continues. Questionable diuresis with lasix.  Objective:  Vital Signs in the last 24 hours: Temp:  [97.5 F (36.4 C)-98.4 F (36.9 C)] 97.5 F (36.4 C) (04/16 0623) Pulse Rate:  [69-75] 75 (04/16 0623) Cardiac Rhythm: Normal sinus rhythm (04/16 0944) Resp:  [18] 18 (04/16 0623) BP: (141-151)/(83-98) 151/98 (04/16 0623) SpO2:  [96 %-99 %] 99 % (04/16 0623) Weight:  [71 kg (156 lb 9.6 oz)] 71 kg (156 lb 9.6 oz) (04/16 16100623)  Physical Exam: BP Readings from Last 1 Encounters:  10/24/17 (!) 151/98     Wt Readings from Last 1 Encounters:  10/24/17 71 kg (156 lb 9.6 oz)    Weight change:  Body mass index is 26.88 kg/m. HEENT: [place]/AT, Eyes-Brown, PERL, EOMI, Conjunctiva-Pink, Sclera-Non-icteric Neck: ++ JVD, No bruit, Trachea midline. Lungs:  Crackles, Bilateral. Cardiac:  Regular rhythm, normal S1 and S2, no S3. II/VI systolic murmur. Abdomen:  Soft, non-tender. BS present. Extremities:  2 + edema  upto thigh and presacral edema present. No cyanosis. No clubbing. CNS: AxOx3, Cranial nerves grossly intact, moves all 4 extremities.  Skin: Warm and dry.   Intake/Output from previous day: 04/15 0701 - 04/16 0700 In: 120 [P.O.:120] Out: 200 [Urine:200]    Lab Results: BMET    Component Value Date/Time   NA 138 10/24/2017 0256   NA 140 2017-09-23 1252   NA 133 (L) 08/17/2017 0811   K 3.3 (L) 10/24/2017 0256   K 3.6 2017-09-23 1252   K 3.8 08/17/2017 0811   CL 110 10/24/2017 0256   CL 108 2017-09-23 1252   CL 93 (L) 08/17/2017 0811   CO2 17 (L) 10/24/2017 0256   CO2 15 (L) 2017-09-23 1252   CO2 25 08/17/2017 0811   GLUCOSE 111 (H) 10/24/2017 0256   GLUCOSE 90 2017-09-23 1252   GLUCOSE 113 (H) 08/17/2017 0811   BUN 62 (H) 10/24/2017 0256   BUN 63 (H) 2017-09-23 1252   BUN 60 (H) 08/17/2017 0811   CREATININE 2.30 (H) 10/24/2017 0256   CREATININE 2.34 (H) 2017-09-23 1252   CREATININE 2.00 (H) 08/17/2017 0811    CALCIUM 8.3 (L) 10/24/2017 0256   CALCIUM 8.9 2017-09-23 1252   CALCIUM 8.5 (L) 08/17/2017 0811   GFRNONAA 21 (L) 10/24/2017 0256   GFRNONAA 21 (L) 2017-09-23 1252   GFRNONAA 25 (L) 08/17/2017 0811   GFRAA 24 (L) 10/24/2017 0256   GFRAA 24 (L) 2017-09-23 1252   GFRAA 29 (L) 08/17/2017 0811   CBC    Component Value Date/Time   WBC 4.6 2017-09-23 1252   RBC 5.81 (H) 2017-09-23 1252   HGB 15.5 (H) 2017-09-23 1252   HCT 45.6 2017-09-23 1252   PLT 167 2017-09-23 1252   MCV 78.5 2017-09-23 1252   MCH 26.7 2017-09-23 1252   MCHC 34.0 2017-09-23 1252   RDW 18.3 (H) 2017-09-23 1252   LYMPHSABS 1.4 2017-09-23 1252   MONOABS 0.4 2017-09-23 1252   EOSABS 0.0 2017-09-23 1252   BASOSABS 0.0 2017-09-23 1252   HEPATIC Function Panel Recent Labs    07/26/17 1326 08/11/17 1741 12-07-2017 1252  PROT 7.9 8.2* 7.7   HEMOGLOBIN A1C No components found for: HGA1C,  MPG CARDIAC ENZYMES Lab Results  Component Value Date   CKTOTAL 59 09/07/2012   CKMB 3.2 09/07/2012   TROPONINI 0.04 (HH) 10/24/2017   TROPONINI 0.05 (HH) 2017-09-23   TROPONINI 0.04 (HH) 2017-09-23   BNP No results for input(s): PROBNP in the  last 8760 hours. TSH No results for input(s): TSH in the last 8760 hours. CHOLESTEROL No results for input(s): CHOL in the last 8760 hours.  Scheduled Meds: . collagenase   Topical Daily  . furosemide  40 mg Intravenous Q12H  . heparin  5,000 Units Subcutaneous Q8H  . sodium chloride flush  3 mL Intravenous Q12H   Continuous Infusions: . sodium chloride    . DOPamine     PRN Meds:.sodium chloride, acetaminophen, ondansetron (ZOFRAN) IV, sodium chloride flush  Assessment/Plan: Acute on chronic bi-ventricular heart failure Hypertension CAD S/P LAD stent Type 2 DM Moderate protein calorie malnutrition Moderate MR Severe TR CKD, III Bilateral lower leg wounds  Add Dopamine drip.   LOS: 1 day    [name]  MD  10/24/2017, 7:07 PM

## 2017-10-24 NOTE — Significant Event (Signed)
  Patient Name: [name]   MRN: [id number]   Date of Birth/ Sex: 07-02-1951 , female      Admission Date: 10/24/2017  Attending Provider: [name], Ajay, MD  Primary Diagnosis: Biventricular heart failure, NYHA class 2 (HCC)   Indication: Pt was in her usual state of health until this PM, when she was noted to be PEA arrest. Code blue was subsequently called. At the time of arrival on scene, ACLS protocol was underway.   Technical Description:  - CPR performance duration:  10 minutes  - Was defibrillation or cardioversion used? No   - Was external pacer placed? No  - Was patient intubated pre/post CPR? Yes   Medications Administered: Y = Yes; Blank = No Amiodarone  Y (150 mg bolus)  Atropine    Calcium    Epinephrine  Y x3  Lidocaine    Magnesium    Norepinephrine    Phenylephrine    Sodium bicarbonate  Y x1  Vasopressin     Post CPR evaluation:  - Final Status - Was patient successfully resuscitated ? Yes - What is current rhythm? Normal sinus rhythm - What is current hemodynamic status? 100s/70s (initially 90s/40s immediately post ROSC)  Miscellaneous Information:  - Labs sent, including: CBC, CMP, troponin, ABG, chest x ray  - Primary team notified?  Yes  - Family Notified? Yes - Patient's family arrived beside upon transfer from 3E08 to 2M02. Updated by primary team who also arrived to code after ROSC  - Additional notes/ transfer status:  Intubated in ICU     [name], Tanya Coral, MD  10/24/2017, 11:29 PM

## 2017-10-24 NOTE — Care Management Note (Signed)
Case Management Note  Patient Details  Name: [name] MRN: [id number] Date of Birth: May 24, 1951  Subjective/Objective:                 Spoke to patient at the bedside. She [facility] [place] [facility]. She granted permission to check with her daughter. CM left message with Renne. [name] states she will have to go to her mom's house to [place] the folder and she will call back tomorrow to provide us with that info. Patient states that she has DME RW and shower seat at home. She lives at home with her spouse. Church members and daughter help to provide transportation to apts and pharmacy. Patient states she does not have any problems affording her medications.   Reha,[name] Spouse [phone]    [name],[name] Daughter   [phone]  Robie RidgeMcCorkle,Edna Friend [phone]        Action/Plan:   Expected Discharge Date:                  Expected Discharge Plan:  Home w Home Health Services  In-House Referral:     Discharge planning Services  CM Consult  Post Acute Care Choice:    Choice offered to:     DME Arranged:    DME Agency:     HH Arranged:    HH Agency:     Status of Service:  In process, will continue to follow  If discussed at Long Length of Stay Meetings, dates discussed:    Additional Comments:  Lawerance SabalDebbie Bunny Kleist, RN 10/24/2017, 12:36 PM

## 2017-10-24 NOTE — Progress Notes (Signed)
Ref: Tanya CobbKadakia, Jaden Abreu, MD   Subjective:  PEA arrest with 10 minutes of CPR, 3 rounds of Epinephrine and one round of Na Bicarb followed by intubation by Anesthesia Service. 150 mg. of IV amiodarone for wide complex tachycardia and return of pulse with good BP. Patient transferred to ICU. On ABG  With pH of 6.99, 2 rounds of Na Bicarb was given. Discussed care with daughter. Right Femoral art-line placed under sterile precautions. CCM helping with vent management and further care. Chest -x-ray post oral gastric tube. Vascular congestion and mild pulmonary edema.  Objective:  Vital Signs in the last 24 hours: Temp:  [97.5 F (36.4 C)-98.6 F (37 C)] 98.6 F (37 C) (04/16 2100) Pulse Rate:  [69-120] 120 (04/16 2154) Cardiac Rhythm: Atrial fibrillation (04/16 2251) Resp:  [18-42] 42 (04/16 2100) BP: (141-165)/(83-106) 159/99 (04/16 2154) SpO2:  [72 %-99 %] 94 % (04/16 2233) Weight:  [71 kg (156 lb 9.6 oz)] 71 kg (156 lb 9.6 oz) (04/16 16100623)  Physical Exam: BP Readings from Last 1 Encounters:  10/24/17 (!) 159/99     Wt Readings from Last 1 Encounters:  10/24/17 71 kg (156 lb 9.6 oz)    Weight change:  Body mass index is 26.88 kg/m. HEENT: [place]/AT, Eyes-Brown, Conjunctiva-Pink, Sclera-Non-icteric Neck: No JVD, No bruit, Trachea midline. Lungs:  Cracles, Bilateral. Cardiac:  Regular rhythm, normal S1 and S2, no S3. II/VI systolic murmur. Abdomen:  Soft, non-tender. BS present. Extremities:  2 + edema present. No cyanosis. No clubbing. CNS: AxOx3, Cranial nerves grossly intact, moves all 4 extremities.  Skin: Warm and dry.   Intake/Output from previous day: 04/15 0701 - 04/16 0700 In: 120 [P.O.:120] Out: 200 [Urine:200]    Lab Results: BMET    Component Value Date/Time   NA 138 10/24/2017 0256   NA 140 01/10/2018 1252   NA 133 (L) 08/17/2017 0811   K 3.3 (L) 10/24/2017 0256   K 3.6 01/10/2018 1252   K 3.8 08/17/2017 0811   CL 110 10/24/2017 0256   CL 108 01/10/2018 1252   CL 93 (L) 08/17/2017 0811   CO2 17 (L) 10/24/2017 0256   CO2 15 (L) 01/10/2018 1252   CO2 25 08/17/2017 0811   GLUCOSE 111 (H) 10/24/2017 0256   GLUCOSE 90 01/10/2018 1252   GLUCOSE 113 (H) 08/17/2017 0811   BUN 62 (H) 10/24/2017 0256   BUN 63 (H) 01/10/2018 1252   BUN 60 (H) 08/17/2017 0811   CREATININE 2.30 (H) 10/24/2017 0256   CREATININE 2.34 (H) 01/10/2018 1252   CREATININE 2.00 (H) 08/17/2017 0811   CALCIUM 8.3 (L) 10/24/2017 0256   CALCIUM 8.9 01/10/2018 1252   CALCIUM 8.5 (L) 08/17/2017 0811   GFRNONAA 21 (L) 10/24/2017 0256   GFRNONAA 21 (L) 01/10/2018 1252   GFRNONAA 25 (L) 08/17/2017 0811   GFRAA 24 (L) 10/24/2017 0256   GFRAA 24 (L) 01/10/2018 1252   GFRAA 29 (L) 08/17/2017 0811   CBC    Component Value Date/Time   WBC 4.6 01/10/2018 1252   RBC 5.81 (H) 01/10/2018 1252   HGB 15.5 (H) 01/10/2018 1252   HCT 45.6 01/10/2018 1252   PLT 167 01/10/2018 1252   MCV 78.5 01/10/2018 1252   MCH 26.7 01/10/2018 1252   MCHC 34.0 01/10/2018 1252   RDW 18.3 (H) 01/10/2018 1252   LYMPHSABS 1.4 01/10/2018 1252   MONOABS 0.4 01/10/2018 1252   EOSABS 0.0 01/10/2018 1252   BASOSABS 0.0 01/10/2018 1252   HEPATIC Function Panel Recent Labs  07/26/17 1326 08/11/17 1741 2017/11/12 1252  PROT 7.9 8.2* 7.7   HEMOGLOBIN A1C No components found for: HGA1C,  MPG CARDIAC ENZYMES Lab Results  Component Value Date   CKTOTAL 59 09/07/2012   CKMB 3.2 09/07/2012   TROPONINI 0.04 (HH) 10/24/2017   TROPONINI 0.05 (HH) Nov 12, 2017   TROPONINI 0.04 (HH) 11-12-17   BNP No results for input(s): PROBNP in the last 8760 hours. TSH No results for input(s): TSH in the last 8760 hours. CHOLESTEROL No results for input(s): CHOL in the last 8760 hours.  Scheduled Meds: . aspirin EC  81 mg Oral Daily  . [START ON 11/03/2017] carvedilol  6.25 mg Oral BID WC  . collagenase   Topical Daily  . furosemide  40 mg Intravenous Q12H  . heparin  5,000 Units Subcutaneous Q8H  . sodium  bicarbonate      . sodium bicarbonate      . sodium chloride flush  3 mL Intravenous Q12H   Continuous Infusions: . sodium chloride    . DOPamine 5 mcg/kg/min (10/24/17 2004)   PRN Meds:.sodium chloride, albuterol, ondansetron (ZOFRAN) IV, sodium chloride flush  Assessment/Plan: PEA arrest survivor Acute on chronic bi-ventricular systolic heart failure CAD S/P LAD stent Type 2 DM Moderate MR Severe TR CKD III Bilateral lower leg wounds  Art-line placed with reasonable arterial pressure reading. Resume low dose dopamine drip. Discussed code status with family.   LOS: 1 day    Tanya Cobb  MD  10/24/2017, 11:54 PM

## 2017-10-24 NOTE — Progress Notes (Signed)
Spoke to Dr. Algie CofferKadakia updated with elevated HR and VS. Sat 80'S. With orders to lessen dopamine drip to 2.5 ml /hr.and  ,may give lasix now sched for 2am.

## 2017-10-25 ENCOUNTER — Inpatient Hospital Stay (HOSPITAL_COMMUNITY): Payer: Medicare HMO

## 2017-10-25 DIAGNOSIS — E872 Acidosis: Secondary | ICD-10-CM

## 2017-10-25 DIAGNOSIS — G931 Anoxic brain damage, not elsewhere classified: Secondary | ICD-10-CM

## 2017-10-25 DIAGNOSIS — J690 Pneumonitis due to inhalation of food and vomit: Secondary | ICD-10-CM

## 2017-10-25 DIAGNOSIS — S270XXA Traumatic pneumothorax, initial encounter: Secondary | ICD-10-CM

## 2017-10-25 DIAGNOSIS — J441 Chronic obstructive pulmonary disease with (acute) exacerbation: Secondary | ICD-10-CM

## 2017-10-25 DIAGNOSIS — N184 Chronic kidney disease, stage 4 (severe): Secondary | ICD-10-CM

## 2017-10-25 DIAGNOSIS — J9601 Acute respiratory failure with hypoxia: Secondary | ICD-10-CM

## 2017-10-25 DIAGNOSIS — N179 Acute kidney failure, unspecified: Secondary | ICD-10-CM

## 2017-10-25 DIAGNOSIS — I5082 Biventricular heart failure: Secondary | ICD-10-CM

## 2017-10-25 DIAGNOSIS — G9340 Encephalopathy, unspecified: Secondary | ICD-10-CM

## 2017-10-25 DIAGNOSIS — I469 Cardiac arrest, cause unspecified: Secondary | ICD-10-CM | POA: Diagnosis present

## 2017-10-25 DIAGNOSIS — J9602 Acute respiratory failure with hypercapnia: Secondary | ICD-10-CM

## 2017-10-25 LAB — POCT I-STAT 3, ART BLOOD GAS (G3+)
Acid-base deficit: 4 mmol/L — ABNORMAL HIGH (ref 0.0–2.0)
Acid-base deficit: 2 mmol/L (ref 0.0–2.0)
Acid-base deficit: 11 mmol/L — ABNORMAL HIGH (ref 0.0–2.0)
Acid-base deficit: 3 mmol/L — ABNORMAL HIGH (ref 0.0–2.0)
Acid-base deficit: 11 mmol/L — ABNORMAL HIGH (ref 0.0–2.0)
BICARBONATE: 19.8 mmol/L — AB (ref 20.0–28.0)
BICARBONATE: 20.7 mmol/L (ref 20.0–28.0)
Bicarbonate: 13.9 mmol/L — ABNORMAL LOW (ref 20.0–28.0)
Bicarbonate: 17.9 mmol/L — ABNORMAL LOW (ref 20.0–28.0)
Bicarbonate: 11.1 mmol/L — ABNORMAL LOW (ref 20.0–28.0)
O2 SAT: 99 %
O2 SAT: 99 %
O2 SAT: 100 %
O2 Saturation: 94 %
O2 Saturation: 85 %
PCO2 ART: 28 mmHg — AB (ref 32.0–48.0)
PH ART: 7.392 (ref 7.350–7.450)
PH ART: 7.455 — AB (ref 7.350–7.450)
PH ART: 7.402 (ref 7.350–7.450)
PO2 ART: 127 mmHg — AB (ref 83.0–108.0)
PO2 ART: 191 mmHg — AB (ref 83.0–108.0)
Patient temperature: 96.3
TCO2: 21 mmol/L — AB (ref 22–32)
TCO2: 22 mmol/L (ref 22–32)
TCO2: 15 mmol/L — AB (ref 22–32)
TCO2: 19 mmol/L — AB (ref 22–32)
TCO2: 12 mmol/L — AB (ref 22–32)
pCO2 arterial: 32.6 mmHg (ref 32.0–48.0)
pCO2 arterial: 29.4 mmHg — ABNORMAL LOW (ref 32.0–48.0)
pCO2 arterial: 21.5 mmHg — ABNORMAL LOW (ref 32.0–48.0)
pCO2 arterial: 17.9 mmHg — CL (ref 32.0–48.0)
pH, Arterial: 7.297 — ABNORMAL LOW (ref 7.350–7.450)
pH, Arterial: 7.53 — ABNORMAL HIGH (ref 7.350–7.450)
pO2, Arterial: 135 mmHg — ABNORMAL HIGH (ref 83.0–108.0)
pO2, Arterial: 66 mmHg — ABNORMAL LOW (ref 83.0–108.0)
pO2, Arterial: 48 mmHg — ABNORMAL LOW (ref 83.0–108.0)

## 2017-10-25 LAB — CBC WITH DIFFERENTIAL/PLATELET
Basophils Absolute: 0 10*3/uL (ref 0.0–0.1)
Basophils Relative: 0 %
EOS ABS: 0 10*3/uL (ref 0.0–0.7)
Eosinophils Relative: 0 %
HCT: 45.4 % (ref 36.0–46.0)
HEMOGLOBIN: 14.6 g/dL (ref 12.0–15.0)
Lymphocytes Relative: 45 %
Lymphs Abs: 3.8 10*3/uL (ref 0.7–4.0)
MCH: 26.3 pg (ref 26.0–34.0)
MCHC: 32.2 g/dL (ref 30.0–36.0)
MCV: 81.7 fL (ref 78.0–100.0)
MONOS PCT: 7 %
Monocytes Absolute: 0.6 10*3/uL (ref 0.1–1.0)
NEUTROS PCT: 48 %
Neutro Abs: 4 10*3/uL (ref 1.7–7.7)
PLATELETS: 97 10*3/uL — AB (ref 150–400)
RBC: 5.56 MIL/uL — AB (ref 3.87–5.11)
RDW: 18.4 % — ABNORMAL HIGH (ref 11.5–15.5)
WBC: 8.4 10*3/uL (ref 4.0–10.5)

## 2017-10-25 LAB — BLOOD GAS, ARTERIAL
Acid-base deficit: 8.4 mmol/L — ABNORMAL HIGH (ref 0.0–2.0)
BICARBONATE: 21.8 mmol/L (ref 20.0–28.0)
DRAWN BY: 236041
FIO2: 100
O2 Saturation: 82.9 %
PCO2 ART: 93.9 mmHg — AB (ref 32.0–48.0)
PH ART: 6.995 — AB (ref 7.350–7.450)
PO2 ART: 76.3 mmHg — AB (ref 83.0–108.0)
Patient temperature: 98.6

## 2017-10-25 LAB — BASIC METABOLIC PANEL
ANION GAP: 12 (ref 5–15)
Anion gap: 14 (ref 5–15)
BUN: 63 mg/dL — ABNORMAL HIGH (ref 6–20)
BUN: 57 mg/dL — ABNORMAL HIGH (ref 6–20)
CALCIUM: 8 mg/dL — AB (ref 8.9–10.3)
CALCIUM: 7.4 mg/dL — AB (ref 8.9–10.3)
CO2: 18 mmol/L — ABNORMAL LOW (ref 22–32)
CO2: 15 mmol/L — ABNORMAL LOW (ref 22–32)
CREATININE: 2.58 mg/dL — AB (ref 0.44–1.00)
Chloride: 111 mmol/L (ref 101–111)
Chloride: 110 mmol/L (ref 101–111)
Creatinine, Ser: 2.61 mg/dL — ABNORMAL HIGH (ref 0.44–1.00)
GFR calc non Af Amer: 18 mL/min — ABNORMAL LOW (ref >60)
GFR, EST AFRICAN AMERICAN: 21 mL/min — AB (ref >60)
GFR, EST AFRICAN AMERICAN: 21 mL/min — AB (ref >60)
GFR, EST NON AFRICAN AMERICAN: 18 mL/min — AB (ref >60)
Glucose, Bld: 136 mg/dL — ABNORMAL HIGH (ref 65–99)
Glucose, Bld: 144 mg/dL — ABNORMAL HIGH (ref 65–99)
POTASSIUM: 2.8 mmol/L — AB (ref 3.5–5.1)
Potassium: 3.9 mmol/L (ref 3.5–5.1)
SODIUM: 139 mmol/L (ref 135–145)
Sodium: 141 mmol/L (ref 135–145)

## 2017-10-25 LAB — PHOSPHORUS
PHOSPHORUS: 2.5 mg/dL (ref 2.5–4.6)
Phosphorus: 4.8 mg/dL — ABNORMAL HIGH (ref 2.5–4.6)

## 2017-10-25 LAB — GLUCOSE, CAPILLARY
GLUCOSE-CAPILLARY: 137 mg/dL — AB (ref 65–99)
GLUCOSE-CAPILLARY: 59 mg/dL — AB (ref 65–99)
Glucose-Capillary: 107 mg/dL — ABNORMAL HIGH (ref 65–99)
Glucose-Capillary: 122 mg/dL — ABNORMAL HIGH (ref 65–99)
Glucose-Capillary: 127 mg/dL — ABNORMAL HIGH (ref 65–99)
Glucose-Capillary: 131 mg/dL — ABNORMAL HIGH (ref 65–99)
Glucose-Capillary: 77 mg/dL (ref 65–99)

## 2017-10-25 LAB — URINALYSIS, ROUTINE W REFLEX MICROSCOPIC
Bilirubin Urine: NEGATIVE
Glucose, UA: NEGATIVE mg/dL
Ketones, ur: NEGATIVE mg/dL
LEUKOCYTES UA: NEGATIVE
Nitrite: NEGATIVE
PROTEIN: 30 mg/dL — AB
SPECIFIC GRAVITY, URINE: 1.006 (ref 1.005–1.030)
pH: 5 (ref 5.0–8.0)

## 2017-10-25 LAB — TROPONIN I
TROPONIN I: 0.88 ng/mL — AB (ref <0.03)
Troponin I: 0.06 ng/mL (ref <0.03)
Troponin I: 0.76 ng/mL (ref <0.03)

## 2017-10-25 LAB — CBC
HCT: 43.7 % (ref 36.0–46.0)
Hemoglobin: 14.7 g/dL (ref 12.0–15.0)
MCH: 26.4 pg (ref 26.0–34.0)
MCHC: 33.6 g/dL (ref 30.0–36.0)
MCV: 78.5 fL (ref 78.0–100.0)
PLATELETS: 114 10*3/uL — AB (ref 150–400)
RBC: 5.57 MIL/uL — ABNORMAL HIGH (ref 3.87–5.11)
RDW: 18 % — ABNORMAL HIGH (ref 11.5–15.5)
WBC: 10.8 10*3/uL — ABNORMAL HIGH (ref 4.0–10.5)

## 2017-10-25 LAB — LACTIC ACID, PLASMA
LACTIC ACID, VENOUS: 3.1 mmol/L — AB (ref 0.5–1.9)
LACTIC ACID, VENOUS: 2.8 mmol/L — AB (ref 0.5–1.9)
Lactic Acid, Venous: 10.3 mmol/L (ref 0.5–1.9)

## 2017-10-25 LAB — COMPREHENSIVE METABOLIC PANEL
ALBUMIN: 2.3 g/dL — AB (ref 3.5–5.0)
ALK PHOS: 69 U/L (ref 38–126)
ALT: 8 U/L — AB (ref 14–54)
AST: 29 U/L (ref 15–41)
Anion gap: 19 — ABNORMAL HIGH (ref 5–15)
BUN: 59 mg/dL — ABNORMAL HIGH (ref 6–20)
CALCIUM: 7.4 mg/dL — AB (ref 8.9–10.3)
CO2: 16 mmol/L — AB (ref 22–32)
CREATININE: 2.34 mg/dL — AB (ref 0.44–1.00)
Chloride: 110 mmol/L (ref 101–111)
GFR calc Af Amer: 24 mL/min — ABNORMAL LOW (ref >60)
GFR calc non Af Amer: 21 mL/min — ABNORMAL LOW (ref >60)
GLUCOSE: 238 mg/dL — AB (ref 65–99)
Potassium: 4.4 mmol/L (ref 3.5–5.1)
SODIUM: 145 mmol/L (ref 135–145)
Total Bilirubin: 1.6 mg/dL — ABNORMAL HIGH (ref 0.3–1.2)
Total Protein: 5.4 g/dL — ABNORMAL LOW (ref 6.5–8.1)

## 2017-10-25 LAB — PROTIME-INR
INR: 2.1
INR: 2.11
PROTHROMBIN TIME: 23.4 s — AB (ref 11.4–15.2)
PROTHROMBIN TIME: 23.5 s — AB (ref 11.4–15.2)

## 2017-10-25 LAB — ECHOCARDIOGRAM COMPLETE
Height: 64 in
Weight: 2592.61 oz

## 2017-10-25 LAB — CORTISOL: Cortisol, Plasma: 24.2 ug/dL

## 2017-10-25 LAB — PROCALCITONIN: PROCALCITONIN: 0.28 ng/mL

## 2017-10-25 LAB — MAGNESIUM
Magnesium: 2.4 mg/dL (ref 1.7–2.4)
Magnesium: 1.9 mg/dL (ref 1.7–2.4)

## 2017-10-25 LAB — MRSA PCR SCREENING: MRSA by PCR: NEGATIVE

## 2017-10-25 LAB — HEMOGLOBIN AND HEMATOCRIT, BLOOD
HEMATOCRIT: 40.2 % (ref 36.0–46.0)
Hemoglobin: 14 g/dL (ref 12.0–15.0)

## 2017-10-25 MED ORDER — SODIUM CHLORIDE 0.9 % IV BOLUS
1000.0000 mL | Freq: Once | INTRAVENOUS | Status: AC
  Administered 2017-10-25: 1000 mL via INTRAVENOUS

## 2017-10-25 MED ORDER — DOBUTAMINE IN D5W 4-5 MG/ML-% IV SOLN
2.5000 ug/kg/min | INTRAVENOUS | Status: DC
  Administered 2017-10-25: 2.5 ug/kg/min via INTRAVENOUS
  Filled 2017-10-25: qty 250

## 2017-10-25 MED ORDER — DEXTROSE 50 % IV SOLN
25.0000 mL | Freq: Once | INTRAVENOUS | Status: AC
  Administered 2017-10-25: 25 mL via INTRAVENOUS
  Filled 2017-10-25: qty 50

## 2017-10-25 MED ORDER — FAMOTIDINE IN NACL 20-0.9 MG/50ML-% IV SOLN
20.0000 mg | INTRAVENOUS | Status: DC
  Filled 2017-10-25: qty 50

## 2017-10-25 MED ORDER — HEPARIN (PORCINE) IN NACL 100-0.45 UNIT/ML-% IJ SOLN
1000.0000 [IU]/h | INTRAMUSCULAR | Status: DC
  Administered 2017-10-25: 1000 [IU]/h via INTRAVENOUS
  Filled 2017-10-25: qty 250

## 2017-10-25 MED ORDER — SODIUM CHLORIDE 0.9 % IV SOLN
Freq: Once | INTRAVENOUS | Status: AC
  Administered 2017-10-25: 22:00:00 via INTRAVENOUS

## 2017-10-25 MED ORDER — LACTATED RINGERS IV SOLN
INTRAVENOUS | Status: DC
  Administered 2017-10-25: 06:00:00 via INTRAVENOUS

## 2017-10-25 MED ORDER — POTASSIUM CHLORIDE 20 MEQ/15ML (10%) PO SOLN
40.0000 meq | Freq: Once | ORAL | Status: AC
  Administered 2017-10-25: 40 meq via ORAL
  Filled 2017-10-25: qty 30

## 2017-10-25 MED ORDER — SODIUM CHLORIDE 0.9% FLUSH: 10.0000 mL | INTRAVENOUS | Status: DC | PRN

## 2017-10-25 MED ORDER — POTASSIUM CHLORIDE CRYS ER 20 MEQ PO TBCR
40.0000 meq | EXTENDED_RELEASE_TABLET | Freq: Once | ORAL | Status: DC
  Filled 2017-10-25: qty 2

## 2017-10-25 MED ORDER — POTASSIUM CHLORIDE 10 MEQ/100ML IV SOLN: 10.0000 meq | INTRAVENOUS | Status: AC

## 2017-10-25 MED ORDER — PIPERACILLIN-TAZOBACTAM 3.375 G IVPB
3.3750 g | Freq: Three times a day (TID) | INTRAVENOUS | Status: DC
  Administered 2017-10-25: 3.375 g via INTRAVENOUS
  Filled 2017-10-25 (×3): qty 50

## 2017-10-25 MED ORDER — DOCUSATE SODIUM 50 MG/5ML PO LIQD
100.0000 mg | Freq: Every day | ORAL | Status: DC
  Administered 2017-10-25: 100 mg
  Filled 2017-10-25: qty 10

## 2017-10-25 MED ORDER — NOREPINEPHRINE BITARTRATE 1 MG/ML IV SOLN
0.0000 ug/min | INTRAVENOUS | Status: DC
  Administered 2017-10-25: 5 ug/min via INTRAVENOUS
  Filled 2017-10-25: qty 4

## 2017-10-25 MED ORDER — DEXTROSE 50 % IV SOLN
INTRAVENOUS | Status: AC
  Administered 2017-10-25: 25 mL via INTRAVENOUS
  Filled 2017-10-25: qty 50

## 2017-10-25 MED ORDER — POTASSIUM CHLORIDE 10 MEQ/100ML IV SOLN
10.0000 meq | INTRAVENOUS | Status: AC
  Administered 2017-10-25 (×5): 10 meq via INTRAVENOUS
  Filled 2017-10-25 (×5): qty 100

## 2017-10-25 MED ORDER — SODIUM CHLORIDE 0.9% FLUSH
10.0000 mL | Freq: Two times a day (BID) | INTRAVENOUS | Status: DC
Start: 2017-10-25 — End: 2017-10-26

## 2017-10-25 MED ORDER — HEPARIN BOLUS VIA INFUSION
1000.0000 [IU] | Freq: Once | INTRAVENOUS | Status: AC
  Administered 2017-10-25: 1000 [IU] via INTRAVENOUS
  Filled 2017-10-25: qty 1000

## 2017-10-25 MED ORDER — PIPERACILLIN-TAZOBACTAM 3.375 G IVPB
3.3750 g | Freq: Three times a day (TID) | INTRAVENOUS | Status: DC
Start: 2017-10-25 — End: 2017-10-25
  Administered 2017-10-25: 3.375 g via INTRAVENOUS
  Filled 2017-10-25 (×2): qty 50

## 2017-10-25 MED ORDER — VASOPRESSIN 20 UNIT/ML IV SOLN
0.0300 [IU]/min | INTRAVENOUS | Status: DC
  Administered 2017-10-25: 0.03 [IU]/min via INTRAVENOUS
  Filled 2017-10-25: qty 2

## 2017-10-25 MED ORDER — EPINEPHRINE PF 1 MG/ML IJ SOLN
0.5000 ug/min | INTRAVENOUS | Status: DC
  Administered 2017-10-25: 20 ug/min via INTRAVENOUS
  Filled 2017-10-25: qty 4

## 2017-10-25 MED ORDER — SODIUM CHLORIDE 0.9 % IV SOLN
INTRAVENOUS | Status: DC
  Administered 2017-10-25: 06:00:00 via INTRAVENOUS

## 2017-10-25 MED ORDER — HYDROCORTISONE NA SUCCINATE PF 100 MG IJ SOLR: 50.0000 mg | Freq: Four times a day (QID) | INTRAMUSCULAR | Status: DC

## 2017-10-25 MED ORDER — FENTANYL CITRATE (PF) 100 MCG/2ML IJ SOLN
100.0000 ug | Freq: Once | INTRAMUSCULAR | Status: AC
  Administered 2017-10-25: 100 ug via INTRAVENOUS

## 2017-10-25 MED ORDER — CHLORHEXIDINE GLUCONATE CLOTH 2 % EX PADS
6.0000 | MEDICATED_PAD | Freq: Every day | CUTANEOUS | Status: DC
  Administered 2017-10-25: 6 via TOPICAL

## 2017-10-25 MED ORDER — METHYLPREDNISOLONE SODIUM SUCC 125 MG IJ SOLR
60.0000 mg | Freq: Four times a day (QID) | INTRAMUSCULAR | Status: DC
  Administered 2017-10-25 (×2): 60 mg via INTRAVENOUS
  Filled 2017-10-25: qty 2
  Filled 2017-10-25: qty 0.96

## 2017-10-25 MED ORDER — HEPARIN (PORCINE) IN NACL 100-0.45 UNIT/ML-% IJ SOLN
1000.0000 [IU]/h | INTRAMUSCULAR | Status: DC
  Administered 2017-10-25: 1000 [IU]/h via INTRAVENOUS
  Filled 2017-10-25 (×2): qty 250

## 2017-10-25 MED FILL — Medication: Qty: 1 | Status: AC

## 2017-10-26 MED FILL — Medication: Qty: 1 | Status: AC

## 2017-10-26 NOTE — Plan of Care (Signed)
  Problem: Education: Goal: Knowledge of General Education information will improve Outcome: Not Met (add Reason)   Problem: Health Behavior/Discharge Planning: Goal: Ability to manage health-related needs will improve Outcome: Not Met (add Reason)   Problem: Clinical Measurements: Goal: Ability to maintain clinical measurements within normal limits will improve Outcome: Not Met (add Reason) Goal: Will remain free from infection Outcome: Not Met (add Reason) Goal: Diagnostic test results will improve Outcome: Not Met (add Reason) Goal: Respiratory complications will improve Outcome: Not Met (add Reason) Goal: Cardiovascular complication will be avoided Outcome: Not Met (add Reason)   Problem: Activity: Goal: Risk for activity intolerance will decrease Outcome: Not Met (add Reason)   Problem: Nutrition: Goal: Adequate nutrition will be maintained Outcome: Not Met (add Reason)   Problem: Coping: Goal: Level of anxiety will decrease Outcome: Not Met (add Reason)   Problem: Elimination: Goal: Will not experience complications related to bowel motility Outcome: Not Met (add Reason) Goal: Will not experience complications related to urinary retention Outcome: Not Met (add Reason)   Problem: Pain Managment: Goal: General experience of comfort will improve Outcome: Not Met (add Reason)   Problem: Safety: Goal: Ability to remain free from injury will improve Outcome: Not Met (add Reason)   Problem: Skin Integrity: Goal: Risk for impaired skin integrity will decrease Outcome: Not Met (add Reason)

## 2017-10-26 NOTE — Progress Notes (Addendum)
   10/26/17 0000  Clinical Encounter Type  Visited With Patient;Patient and family together  Visit Type Death  Responded to page for death. Patient's family was at bedside. Contacted daughter and husband and they will not be coming to hospital. They will contact hospital tomorrow about remains. Sister, son and nephew came to bedside. Provided spiritual and emotional support to family. Marilynn Latinooni Brentlee Sciara

## 2017-10-26 NOTE — Progress Notes (Signed)
150mL Fentanyl wasted in sink with Loss adjuster, charteredMindy Charge RN

## 2017-10-26 NOTE — Progress Notes (Signed)
Two silver bracelets taken to pt's sister in room 6C03. Pt's son at bedside.

## 2017-10-30 ENCOUNTER — Telehealth: Payer: Self-pay

## 2017-10-30 LAB — CULTURE, BLOOD (ROUTINE X 2)
CULTURE: NO GROWTH
Culture: NO GROWTH

## 2017-10-30 NOTE — Telephone Encounter (Signed)
On 10/30/17 I received a d/c from [employer] (original). The d/c is for cremation. The patient is a patient of Nicholos JohnsKathleen Hammonds. The d/c will be taken to Pulmonary Unit for signature.  On 10/30/17 I received the d/c back from Doctor Craige CottaSood who signed the d/c for Doctor Hammonds. I got the d/c ready and called the funeral home to let them know the d/c is ready for pickup. I also faxed a copy to the funeral home per the funeral home request.

## 2017-11-08 NOTE — Progress Notes (Signed)
Patient arrived on department approx 0330 AM.  On admission to department patient making purposeful movements and appears to be following commands.  With these finding MD Hammonds contacted for direction in relation to moving forward with cooling.  Dr Merlene PullingHammonds gave order to cancel Code Cool and move patient back to 2MW.   Made Dr. Merlene PullingHammonds aware of Potassium of 4.4 on most recent labs and asked if she wanted to continue with the ordered 4 runs of potassium.  She provided verbal order not to administer potassium.    Jacqulyn Canehristopher Scott Kynadie Yaun RN, BSN, CCRN

## 2017-11-08 NOTE — Progress Notes (Signed)
eLink Physician-Brief Progress Note Patient Name: [name] DOB: 1951-05-10 MRN: [id number]   Date of Service  11/26/2017  HPI/Events of Note  Abrupt drop in blood pressure of unclear etiology.  eICU Interventions  Stat H & H, ABG, portable CXR to exclude hemorrhage s/p resumption of Heparin following chest tube placement, acidosis with hypotension s/p reduction in resp rate, and re-occurence of pneumothorax. Dobutamine d/c and Dopamine titrated up. Norepinephrine and Vasopressin started for persistent hypotension        Migdalia DkOkoronkwo U Ogan 11/26/2017, 10:18 PM

## 2017-11-08 NOTE — Progress Notes (Signed)
ANTICOAGULATION CONSULT NOTE - Initial Consult  Pharmacy Consult for heparin Indication: atrial fibrillation  Allergies  Allergen Reactions  . Bidil [Isosorb Dinitrate-Hydralazine] Itching and Other (See Comments)    Jittery, feels like something is crawling    Patient Measurements: Height: 5\' 4"  (162.6 cm) Weight: 156 lb 9.6 oz (71 kg) IBW/kg (Calculated) : 54.7 Heparin Dosing Weight: 69.3 kg  Vital Signs: Temp: 98.2 F (36.8 C) (04/17 0029) Temp Source: Oral (04/17 0029) BP: 159/99 (04/16 2154) Pulse Rate: 120 (04/16 2154)  Labs: Recent Labs    10/22/2017 1252 10/19/2017 1900 10/24/17 0256 10/24/17 2356  HGB 15.5*  --   --  14.6  HCT 45.6  --   --  45.4  PLT 167  --   --  PENDING  LABPROT  --   --   --  23.4*  INR  --   --   --  2.10  CREATININE 2.34*  --  2.30*  --   TROPONINI 0.04* 0.05* 0.04*  --     Estimated Creatinine Clearance: 23.6 mL/min (A) (by C-G formula based on SCr of 2.3 mg/dL (H)).   Medical History: Past Medical History:  Diagnosis Date  . Biventricular heart failure (HCC)    systolic/notes 07/25/2016  . Biventricular heart failure (HCC) 10/2017  . CHF (congestive heart failure) (HCC)   . Coronary artery disease   . Diet-controlled type 2 diabetes mellitus (HCC)   . GERD (gastroesophageal reflux disease)   . Gout   . High cholesterol   . History of gout   . Hypertension   . Shortness of breath dyspnea   . Stomach ulcer     Medications:  Medications Prior to Admission  Medication Sig Dispense Refill Last Dose  . albuterol (PROVENTIL HFA;VENTOLIN HFA) 108 (90 Base) MCG/ACT inhaler Inhale 2 puffs into the lungs every 6 (six) hours as needed for wheezing or shortness of breath.    [facility]  . allopurinol (ZYLOPRIM) 100 MG tablet Take 1 tablet (100 mg total) by mouth daily.   10/22/2017 at Unknown time  . aspirin EC 81 MG tablet Take 81 mg by mouth daily.   10/17/2017 at Unknown time  . carvedilol (COREG) 6.25 MG tablet Take 1 tablet (6.25 mg  total) by mouth 2 (two) times daily with a meal. 60 tablet 3 10/10/2017 at 0800  . furosemide (LASIX) 80 MG tablet Take 1 tablet (80 mg total) by mouth daily. (Patient not taking: Reported on 10/24/2017) 30 tablet 1 Not Taking at Unknown time  . potassium chloride (K-DUR,KLOR-CON) 10 MEQ tablet Take 1 tablet (10 mEq total) by mouth daily. (Patient not taking: Reported on 10/24/2017)   Not Taking at Unknown time    Assessment: 67 yo lady s/p PEA arrest to start heparin therapy for afib.  Baseline INR elevated Goal of Therapy:  Heparin level 0.3-0.7 units/ml Monitor platelets by anticoagulation protocol: Yes   Plan:  Heparin drip at 1000 units/hr after small 1000 unit bolus Check heparin level 8 hours after start Daily HL and CBC while on heparin Monitor for bleeding complications  Terry Bolotin Poteet 05-Sep-2017,12:53 AM

## 2017-11-08 NOTE — Progress Notes (Signed)
Please Note: The ABG recorded as 4/17 at 23:10 was entered into the computer incorrectly. This ABG was from 4/16 at 23:10 immediately following resuscitation of her cardiac arrest. Lab system was down at that time and results entered manually but with wrong date.

## 2017-11-08 NOTE — Progress Notes (Signed)
ANTICOAGULATION CONSULT NOTE - Initial Consult  Pharmacy Consult for heparin Indication: atrial fibrillation  Allergies  Allergen Reactions  . Bidil [Isosorb Dinitrate-Hydralazine] Itching and Other (See Comments)    Jittery, feels like something is crawling   Patient Measurements: Height: 5\' 4"  (162.6 cm) Weight: 162 lb 0.6 oz (73.5 kg) IBW/kg (Calculated) : 54.7 Heparin Dosing Weight: 69.3 kg  Vital Signs: Temp: 97.6 F (36.4 C) (04/17 1600) Temp Source: Axillary (04/17 1520) BP: 111/65 (04/17 1923) Pulse Rate: 148 (04/17 1923)  Labs: Recent Labs    10/26/2017 1252  10/24/17 0256 10/24/17 2356 11/02/2017 1041  HGB 15.5*  --   --  14.6 14.7  HCT 45.6  --   --  45.4 43.7  PLT 167  --   --  97* 114*  LABPROT  --   --   --  23.4* 23.5*  INR  --   --   --  2.10 2.11  CREATININE 2.34*  --  2.30* 2.34* 2.61*  TROPONINI 0.04*   < > 0.04* 0.06* 0.76*   < > = values in this interval not displayed.    Estimated Creatinine Clearance: 21.1 mL/min (A) (by C-G formula based on SCr of 2.61 mg/dL (H)).   Medical History: Past Medical History:  Diagnosis Date  . Biventricular heart failure (HCC)    systolic/notes 07/25/2016  . Biventricular heart failure (HCC) 10/2017  . CHF (congestive heart failure) (HCC)   . Coronary artery disease   . Diet-controlled type 2 diabetes mellitus (HCC)   . GERD (gastroesophageal reflux disease)   . Gout   . High cholesterol   . History of gout   . Hypertension   . Shortness of breath dyspnea   . Stomach ulcer     Medications:  Medications Prior to Admission  Medication Sig Dispense Refill Last Dose  . albuterol (PROVENTIL HFA;VENTOLIN HFA) 108 (90 Base) MCG/ACT inhaler Inhale 2 puffs into the lungs every 6 (six) hours as needed for wheezing or shortness of breath.    [facility]  . allopurinol (ZYLOPRIM) 100 MG tablet Take 1 tablet (100 mg total) by mouth daily.   10/22/2017 at Unknown time  . aspirin EC 81 MG tablet Take 81 mg by mouth daily.    12/20/2017 at Unknown time  . carvedilol (COREG) 6.25 MG tablet Take 1 tablet (6.25 mg total) by mouth 2 (two) times daily with a meal. 60 tablet 3 12/20/2017 at 0800  . furosemide (LASIX) 80 MG tablet Take 1 tablet (80 mg total) by mouth daily. (Patient not taking: Reported on 10/24/2017) 30 tablet 1 Not Taking at Unknown time  . potassium chloride (K-DUR,KLOR-CON) 10 MEQ tablet Take 1 tablet (10 mEq total) by mouth daily. (Patient not taking: Reported on 10/24/2017)   Not Taking at Unknown time    Assessment: 67 yo lady s/p PEA arrest to start heparin therapy for afib.  Baseline INR elevated. Held heparin for chest tube placement due to pneumothorax. Minimal blood loss noted. Now to restart afterwards.  Goal of Therapy:  Heparin level 0.3-0.7 units/ml Monitor platelets by anticoagulation protocol: Yes   Plan:  No heparin bolus Start heparin gtt at 1,000 units/hr Monitor daily heparin level, CBC, s/s of bleed  Hokulani Rogel J 10/13/2017,7:45 PM

## 2017-11-08 NOTE — Progress Notes (Signed)
CRITICAL VALUE ALERT  Critical Value: Lactic Acid 10.3  Date & Time Notied:  10/25/2016 0110  Provider Notified: Posey BoyerBrooke Simpson NP  Orders Received/Actions taken: none

## 2017-11-08 NOTE — Progress Notes (Signed)
Pt still SOB. Sat now in the 80's.notified rapid response [place] to check patient, who came in and assessed pt. Dr Francina AmesKadiakia made aware pt deteriorating. Pt put on nonrebreather. Pt becoming cyanotic and in agonal breathing. Code eventually called.

## 2017-11-08 NOTE — Progress Notes (Signed)
Pt noted SOB and congested . Sat low 90's to high 80's. Called for resp for treatment.

## 2017-11-08 NOTE — Code Documentation (Addendum)
Rapid Response Team Note:  I was called to assess the patient for hypoxia at 2240. I instructed the RN to place the patient on a NRB and that I was on my way. I arrived at 2242, I found the patient to be unresponsive, hypotensive, hypoxic, hands were cold/blue/mottled, and agonally breathing.  Patient was on a dopamine drip at 1.86968mcg/kg/min and the SBP was in the 70s and MAPs in the 50s, HR was in the 70s, pulse was very weak. RN called MD and I spoke with immediately, I informed that the patient was peri-arrest and that I was going to call Anesthesia to come and intubate the patient. MD said he was on the way. I called a Code Blue at 2255 because patient's pulse was no longer palpable.  I immediately started chest compressions and RT was already at the bedside and the patient was being ventilated via BVM.   Code Team arrived, patient was intubated by Anesthesia during the Code Blue, and patient achieved ROSC at 2305. Patient appeared to be AFIB in the 160s and had periods of wide complex tachyarrthymias, patient was given Amio 150mg  bolus IV and patient was transferred to MICU (2M02). Cards MD arrived. Family was notified as well.  Call Time 2240 Arrival Time 2242 End Time 2345

## 2017-11-08 NOTE — Progress Notes (Signed)
CRITICAL VALUE ALERT  Critical Value:  Lactic Acid 2.8  Date & Time Notied:  05/27/2018 2125  Provider Notified: [place]  Orders Received/Actions taken: no new orders

## 2017-11-08 NOTE — Progress Notes (Signed)
EEG Completed; Results Pending  

## 2017-11-08 NOTE — Progress Notes (Signed)
Ref: Dixie Dials, MD   Subjective:  Intubated. Opens eyes on tapping shoulder. Afebrile. Echocardiogram shows poor LV and RV systolic function. Chest tube placed for pneumothorax. Improved blood pressure on dopamine and dobutamine drips.   Objective:  Vital Signs in the last 24 hours: Temp:  [96.3 F (35.7 C)-98.6 F (37 C)] 96.4 F (35.8 C) (04/17 1520) Pulse Rate:  [58-146] 142 (04/17 1700) Cardiac Rhythm: Normal sinus rhythm (04/17 1600) Resp:  [0-42] 28 (04/17 1700) BP: (93-166)/(66-106) 118/84 (04/17 1700) SpO2:  [26 %-100 %] 98 % (04/17 1700) Arterial Line BP: (100-185)/(56-113) 146/94 (04/17 1700) FiO2 (%):  [50 %-80 %] 70 % (04/17 1637) Weight:  [73.5 kg (162 lb 0.6 oz)] 73.5 kg (162 lb 0.6 oz) (04/17 0500)  Physical Exam: BP Readings from Last 1 Encounters:  2017/11/09 118/84     Wt Readings from Last 1 Encounters:  11/09/17 73.5 kg (162 lb 0.6 oz)    Weight change: 2.195 kg (4 lb 13.4 oz) Body mass index is 27.81 kg/m. HEENT: [place]/AT, Eyes-Brown, PERL, Conjunctiva-Pink, Sclera-Non-icteric. Intubated. Neck: No JVD, No bruit, Trachea midline. Lungs:  Clearing, Bilateral. Cardiac:  Regular rhythm, normal S1 and S2, no S3. II/VI systolic murmur. Abdomen:  Soft, non-tender. BS present. Extremities:  No edema present. No cyanosis. No clubbing. CNS: AxOx3, Cranial nerves grossly intact, moves all 4 extremities.  Skin: Warm and dry.   Intake/Output from previous day: 04/16 0701 - 04/17 0700 In: 120 [P.O.:120] Out: 200 [Urine:200]    Lab Results: BMET    Component Value Date/Time   NA 141 09-Nov-2017 1041   NA 145 10/24/2017 2356   NA 138 10/24/2017 0256   K 2.8 (L) 2017-11-09 1041   K 4.4 10/24/2017 2356   K 3.3 (L) 10/24/2017 0256   CL 111 2017-11-09 1041   CL 110 10/24/2017 2356   CL 110 10/24/2017 0256   CO2 18 (L) 2017/11/09 1041   CO2 16 (L) 10/24/2017 2356   CO2 17 (L) 10/24/2017 0256   GLUCOSE 136 (H) Nov 09, 2017 1041   GLUCOSE 238 (H) 10/24/2017  2356   GLUCOSE 111 (H) 10/24/2017 0256   BUN 63 (H) 09-Nov-2017 1041   BUN 59 (H) 10/24/2017 2356   BUN 62 (H) 10/24/2017 0256   CREATININE 2.61 (H) 2017/11/09 1041   CREATININE 2.34 (H) 10/24/2017 2356   CREATININE 2.30 (H) 10/24/2017 0256   CALCIUM 8.0 (L) Nov 09, 2017 1041   CALCIUM 7.4 (L) 10/24/2017 2356   CALCIUM 8.3 (L) 10/24/2017 0256   GFRNONAA 18 (L) 11-09-17 1041   GFRNONAA 21 (L) 10/24/2017 2356   GFRNONAA 21 (L) 10/24/2017 0256   GFRAA 21 (L) 11/09/17 1041   GFRAA 24 (L) 10/24/2017 2356   GFRAA 24 (L) 10/24/2017 0256   CBC    Component Value Date/Time   WBC 10.8 (H) 09-Nov-2017 1041   RBC 5.57 (H) Nov 09, 2017 1041   HGB 14.7 11-09-2017 1041   HCT 43.7 11/09/17 1041   PLT 114 (L) 2017-11-09 1041   MCV 78.5 11-09-17 1041   MCH 26.4 Nov 09, 2017 1041   MCHC 33.6 Nov 09, 2017 1041   RDW 18.0 (H) 09-Nov-2017 1041   LYMPHSABS 3.8 10/24/2017 2356   MONOABS 0.6 10/24/2017 2356   EOSABS 0.0 10/24/2017 2356   BASOSABS 0.0 10/24/2017 2356   HEPATIC Function Panel Recent Labs    08/11/17 1741 10/22/2017 1252 10/24/17 2356  PROT 8.2* 7.7 5.4*   HEMOGLOBIN A1C No components found for: HGA1C,  MPG CARDIAC ENZYMES Lab Results  Component Value  Date   CKTOTAL 59 09/07/2012   CKMB 3.2 09/07/2012   TROPONINI 0.76 (HH) 11-23-2017   TROPONINI 0.06 (HH) 10/24/2017   TROPONINI 0.04 (HH) 10/24/2017   BNP No results for input(s): PROBNP in the last 8760 hours. TSH No results for input(s): TSH in the last 8760 hours. CHOLESTEROL No results for input(s): CHOL in the last 8760 hours.  Scheduled Meds: . aspirin EC  81 mg Oral Daily  . chlorhexidine gluconate (MEDLINE KIT)  15 mL Mouth Rinse BID  . Chlorhexidine Gluconate Cloth  6 each Topical Daily  . collagenase   Topical Daily  . docusate  100 mg Per Tube Daily  . insulin aspart  2-6 Units Subcutaneous Q4H  . ipratropium-albuterol  3 mL Nebulization Q6H  . mouth rinse  15 mL Mouth Rinse QID  . sodium chloride flush   10-40 mL Intracatheter Q12H  . sodium chloride flush  3 mL Intravenous Q12H   Continuous Infusions: . sodium chloride    . sodium chloride    . sodium chloride 10 mL/hr at 11/23/17 0800  . DOBUTamine 2.5 mcg/kg/min (2017-11-23 1145)  . DOPamine 5 mcg/kg/min (11/23/2017 1200)  . [START ON 10/26/2017] famotidine (PEPCID) IV    . fentaNYL infusion INTRAVENOUS 50 mcg/hr (Nov 23, 2017 1530)  . norepinephrine (LEVOPHED) Adult infusion 0 mcg/min (Nov 23, 2017 1200)  . piperacillin-tazobactam (ZOSYN)  IV 3.375 g (2017/11/23 1631)  . potassium chloride 10 mEq (Nov 23, 2017 1628)   PRN Meds:.sodium chloride, sodium chloride, acetaminophen (TYLENOL) oral liquid 160 mg/5 mL **OR** acetaminophen, albuterol, docusate, fentaNYL, midazolam, midazolam, ondansetron (ZOFRAN) IV, sodium chloride flush, sodium chloride flush  Assessment/Plan: PEA arrest survivor Ventilator dependent respiratory failure Acute on chronic bi-ventricular systolic heart failure CAD S/P LAD stent Type 2 DM Moderate MR Severe TR Severe pulmonary systolic hypertension CKD, III Bilateral lower leg wounds Iatrogenic pneumothorax, S/P chest tube placement  Partial code per family request. Continue medications.   LOS: 2 days    Dixie Dials  MD  11/23/17, 6:22 PM

## 2017-11-08 NOTE — Procedures (Signed)
Chest Tube Insertion Procedure Note  Indications:  Clinically significant Pneumothorax  Pre-operative Diagnosis: Pneumothorax  Post-operative Diagnosis: Pneumothorax  Procedure Details  Informed consent was obtained for the procedure, including sedation.  Risks of lung perforation, hemorrhage, arrhythmia, and adverse drug reaction were discussed.   After sterile skin prep, using standard technique, a 14 French tube was placed in the right lateral 5th rib space.  Findings: Rush of air returned.    Estimated Blood Loss:  Minimal         Specimens:  None              Complications:  None; patient tolerated the procedure well.         Disposition: ICU - intubated and critically ill.         Condition: stable  Procedure performed under direct supervision of Dr. Delton CoombesByrum.     Tanya BrimBrandi Revanth Neidig, NP-C [facility] Pgr: [phone] or if no answer [phone] 10/30/2017, 4:26 PM

## 2017-11-08 NOTE — Progress Notes (Addendum)
PULMONARY  / CRITICAL CARE MEDICINE  Name: [name] MRN: [id number] DOB: 01/20/1951    LOS: 2  REFERRING MD :  Dr. Algie CofferKadakia   CHIEF COMPLAINT:  Cardiac arrest   BRIEF PATIENT DESCRIPTION: 67yo F with PMH CAD, BiV HF, T2DM, HTN, HLD, and undiagnosed COPD who presented on 4/16 with volume overload and admitted for HF exacerbation. Poor response to diuresis with Lasix and started on dopamine 4/16. That evening she became hypoxic, unresponsive, and PEA arrested. She received 10 minutes of CPR, 3 rounds of epi, and 2 amos of bicarb. She was noted to have a wide complez tachycardia and Afib during code and also received amiodarone. EKG post-arrest with sinus tachycardia. No cooling as patient was following commands and making purposeful movements on arrival to ICU.   LINES / TUBES: 4/16 ETT > 4/16 PIV RUE and LEU, Foley, and R art line   CULTURES: 4/16 Blood culture > 4/16 Tracheal aspirate >   ANTIBIOTICS: 4/16 Zosyn >  SIGNIFICANT EVENTS:  4/15 Admitted for heart failure exacerbation 4/16 Started on dopamine drip 4/16 PEA arrest > intubated   INTERVAL HISTORY: Patient intubated and sedated. Does open eyes to verbal stimuli.   VITAL SIGNS: Temp:  [97.4 F (36.3 C)-98.6 F (37 C)] 97.4 F (36.3 C) (04/17 0441) Pulse Rate:  [58-120] 69 (04/17 0630) Resp:  [15-42] 20 (04/17 0630) BP: (93-166)/(66-106) 112/71 (04/17 0600) SpO2:  [72 %-100 %] 95 % (04/17 0630) Arterial Line BP: (111-185)/(58-113) 143/73 (04/17 0630) FiO2 (%):  [60 %-80 %] 60 % (04/17 0332) Weight:  [162 lb 0.6 oz (73.5 kg)] 162 lb 0.6 oz (73.5 kg) (04/17 0500) HEMODYNAMICS:   VENTILATOR SETTINGS: Vent Mode: PRVC FiO2 (%):  [60 %-80 %] 60 % Set Rate:  [20 bmp] 20 bmp Vt Set:  [470 mL] 470 mL PEEP:  [7 cmH20] 7 cmH20 Plateau Pressure:  [21 cmH20-23 cmH20] 21 cmH20 INTAKE / OUTPUT: Intake/Output      04/16 0701 - 04/17 0700   P.O. 120   Total Intake(mL/kg) 120 (1.6)   Urine (mL/kg/hr) 200 (0.1)   Total Output 200   Net -80         PHYSICAL EXAMINATION: General:  Intubated and sedated, appears comfortable on room air  Neuro:  Intubated and sedated, opens eyes to verbal stimuli, does not follow commands  HEENT:  NCAT, PERRL, MMM  Cardiovascular:  RRR, nl S1/S2, no mrg, JVD up to ear  Lungs:  Diffuse crackles bilaterally, no wheezes Abdomen: soft, NTND, abdominal wall edema noted, normoactive bowel sounds  Musculoskeletal:  No deformities, 2+ LE edema extending to abdominal wall  Skin: bilateral LE wounds    LABS: Cbc Recent Labs  Lab May 27, 2018 1252 10/24/17 2356  WBC 4.6 8.4  HGB 15.5* 14.6  HCT 45.6 45.4  PLT 167 97*    Chemistry  Recent Labs  Lab May 27, 2018 1252 10/24/17 0256 10/24/17 2356  NA 140 138 145  K 3.6 3.3* 4.4  CL 108 110 110  CO2 15* 17* 16*  BUN 63* 62* 59*  CREATININE 2.34* 2.30* 2.34*  CALCIUM 8.9 8.3* 7.4*  MG  --   --  2.4  PHOS  --   --  4.8*  GLUCOSE 90 111* 238*    Liver fxn Recent Labs  Lab May 27, 2018 1252 10/24/17 2356  AST 17 29  ALT 10* 8*  ALKPHOS 71 69  BILITOT 3.0* 1.6*  PROT 7.7 5.4*  ALBUMIN 3.5 2.3*   coags Recent Labs  Lab 10/24/17 2356  INR 2.10   Sepsis markers Recent Labs  Lab 10/24/17 2354 10/24/17 2356  LATICACIDVEN 10.3*  --   PROCALCITON  --  0.28   Cardiac markers Recent Labs  Lab October 31, 2017 1900 10/24/17 0256 10/24/17 2356  TROPONINI 0.05* 0.04* 0.06*   BNP No results for input(s): PROBNP in the last 168 hours. ABG Recent Labs  Lab 10/31/2017 0104 10/11/2017 0456 11/01/2017 2310  PHART 7.392 7.455* 6.995*  PCO2ART 32.6 29.4* 93.9*  PO2ART 135.0* 66.0* 76.3*  HCO3 19.8* 20.7 21.8  TCO2 21* 22  --     CBG trend Recent Labs  Lab 10-31-17 1357 10/24/17 2305 11/03/2017 0027 10/24/2017 0436  GLUCAP 76 157* 127* 122*    IMAGING:  ECG:  DIAGNOSES: Principal Problem:   Biventricular heart failure, NYHA class 2 (HCC) Active Problems:   Type 2 diabetes mellitus, controlled, with renal  complications (HCC)   Essential hypertension   Malnutrition of moderate degree   Bilateral cellulitis of lower leg   Acute on chronic left systolic heart failure (HCC)   Cardiac arrest (HCC)   ASSESSMENT / PLAN:  PULMONARY A: Acute hypoxic and hypercarbic respiratory failure - in the setting of HF exacerbation and undiagnosed COPD. Improving, last ABG 7.45/29.4/66/20.7 ? Aspiration PNA - LLL infiltrate on CXR   PLAN:   - Continue vent support  - Daily CXR - Continue Zosyn  - Scheduled duonebs  - Solumedrol 60 mg q6h  - Daily SBT assessment   CARDIOVASCULAR A:  PEA arrest - 10 minutes of CPR with 3 rounds of epi. Not cooled.  Head CT negative for acute process.  Biventricular HF - TTE 2017 with EF 25%, severe hypokinesis, severe RV dysfunction, PAPP 65, moderate  MR   Hx CAD - multivessel, non-obstructive disease on LHC 09/2016 Troponinemia: likely 2/2 to ischemic insult due to cardiac arrest  Hypotensive   PLAN:  - Currently on dopamine gtt. Levophed added  - Start dobutamine at 2.5  - Will hold off on Lasix for now pending hemodynamic stability - MAP goal > 65 - Trending troponin  - Follow up TTE  - Follow up lactate   RENAL A:   AKI on CKD - baseline renal function unclear, appears to be between 1.5-2 Lactic acidosis - 10.3 post-arrest  PLAN:   - Currently oliguric  - Continue monitoring UOP and lytes  - Trending LA   GASTROINTESTINAL A   No active issues   PLAN:   - IV famotidine for GI ppx   HEMATOLOGIC A :  Thrombocytopenia - new, plt 97   PLAN:  - On heparin gtt  - Follow-up CBC closely   INFECTIOUS A:   ? Aspiration PNA - afebrile, PCT 0.28 ? Infected wounds - wound care consult seen this hospitalization  PLAN:   - Continue Zosyn  - Follow up Bcx and tracheal aspirate  - Trending PCT   ENDOCRINE A:   DM2 - not insulin dependent, CBG at goal   P:   - SSI with CBG monitoring  - CBG goal < 180   NEUROLOGIC A:   Encephalopathy -  head CT negative   P:   - Intubated and sedated  - Fentanyl gtt with RASS goal: -1, 0  Burna Cash, MD  Internal Medicine PGY-1  P [phone] 10/24/2017, 6:33 AM

## 2017-11-08 NOTE — Progress Notes (Signed)
eLink Physician-Brief Progress Note Patient Name: Tanya SpikeMargaret A [name] DOB: 12-12-1950 MRN: [id number]   Date of Service  10/10/2017  HPI/Events of Note  Pt on heparin inf for afib. Had chest tube for pneumothorax at 4:30 pm with heparin inf on hold post-procedure for past 3 hours.  eICU Interventions  Restart heparin infusion per protocol        Aluna Whiston U Zacheriah Stumpe 10/24/2017, 8:01 PM

## 2017-11-08 NOTE — Procedures (Addendum)
ELECTROENCEPHALOGRAM REPORT  Date of Study: April 30, 2018  Patient's Name: Tanya SpikeMargaret A Pirozzi MRN: [id number] Date of Birth: [birth year]  Referring Provider: Dr. Milana ObeyKathleen Hammonds  Clinical History: This is a 67 year old woman with altered mental status.  Medications: No sedating medications during EEG  Technical Summary: A multichannel digital EEG recording measured by the international 10-20 system with electrodes applied with paste and impedances below 5000 ohms performed as portable with EKG monitoring in an intubated and unresponsive patient.  Hyperventilation and photic stimulation were not performed.  The digital EEG was referentially recorded, reformatted, and digitally filtered in a variety of bipolar and referential montages for optimal display.   Description: The patient is intubated and unresponsive during the recording.  No sedating medications listed. There is no clear posterior dominant rhythm seen. The background consists of a large amount of diffuse 4-5 Hz theta and 2-3 Hz delta slowing. Normal sleep architecture is not seen. The study is limited by muscle artifact obscuring the bilateral frontal and temporal regions. Hyperventilation and photic stimulation were not performed. In between artifact, there were no epileptiform discharges or electrographic seizures seen.    EKG lead showed sinus tachycardia at 144bmp.  Impression: This limited EEG is abnormal due to moderate diffuse background slowing. Study is limited by muscle artifact obscuring frontal and temporal leads.  Clinical Correlation of the above findings indicates diffuse cerebral dysfunction that is non-specific in etiology and can be seen with hypoxic/ischemic injury, toxic/metabolic encephalopathies, neurodegenerative disorders, or medication effect.  The absence of epileptiform discharges does not rule out a clinical diagnosis of epilepsy.  Clinical correlation is advised.   Patrcia DollyKaren Aquino, M.D.

## 2017-11-08 NOTE — Progress Notes (Signed)
  Echocardiogram 2D Echocardiogram has been performed.  Tawney Vanorman G Noorah Giammona 10/31/2017, 1:28 PM

## 2017-11-08 NOTE — Progress Notes (Signed)
Hypoglycemic Event  CBG: *59  Treatment: D50 IV 25 mL  Symptoms: None  Follow-up CBG: Time:2024 CBG Result:137  Possible Reasons for Event: Inadequate meal intake     Tanya BleacherAlma L Zorawar [name]

## 2017-11-08 NOTE — Progress Notes (Signed)
Initial Nutrition Assessment  DOCUMENTATION CODES:   Not applicable  INTERVENTION:  If supportive care to continue, consult RD if nutrition support needed.  NUTRITION DIAGNOSIS:   Inadequate oral intake related to inability to eat(Vent) as evidenced by NPO status.  GOAL:   Provide needs based on ASPEN/SCCM guidelines  MONITOR:   Vent status, Labs, I & O's, Weight trends  REASON FOR ASSESSMENT:   Ventilator    ASSESSMENT:   67 y.o F admitted for L leg wound and HF exacerbation on 4/15. Pt was intubated on 4/16. PMH of BiV HF, CHF, CAD, gout, hyperlipidemia, T2DM, diagnosis of moderate malnutrition in 2018.   Unable to assess pt due to critical status.   Medications reviewed: coreg, novolog 2-6 units, solu-medrol, dopamine 800 mg in 5% dextrose 250 ml (3.383ml/hr), fentanyl, heparin, lactated ringers  Labs reviewed: 10/24/17: CO2 (L), BG 238 (H), BUN 59 (H), creatinine 2.34 (H), Anion gap 19 (H), phosphorus 4.8 (H), ALT 8 (L), total bilirubin 1.6 (H), GFR 21 (L), troponin 0.06 (H), RBC 5.56 (H), platelets 97 (L), PTT 23.4 (H).   MAP: 91  MVe: 9.4  Temp (24hrs), Avg:97.6 F (36.4 C), Min:96.3 F (35.7 C), Max:98.6 F (37 C)   Intake/Output Summary (Last 24 hours) at 10/22/2017 1232 Last data filed at 11/07/2017 0800 Gross per 24 hour  Intake 258.97 ml  Output 325 ml  Net -66.03 ml   Lab Results  Component Value Date   HGBA1C 6.3 (H) 03/11/2017    NUTRITION - FOCUSED PHYSICAL EXAM:  Unable to assess, pt in critical condition  Diet Order:  Diet NPO time specified  EDUCATION NEEDS:   Not appropriate for education at this time  Skin:  Skin Assessment: Skin Integrity Issues: Skin Integrity Issues:: Diabetic Ulcer Diabetic Ulcer: L Leg (3 separate wounds)  Last BM:  unknown  Height:   Ht Readings from Last 1 Encounters:  10/24/17 5\' 4"  (1.626 m)    Weight:   Wt Readings from Last 1 Encounters:  10/11/2017 162 lb 0.6 oz (73.5 kg)   UBW: Too many  fluctuations in chart weights to determine UBW % UBW: NA  Ideal Body Weight:  54.54 kg  BMI:  Body mass index is 27.81 kg/m.  Estimated Nutritional Needs:   Kcal:  1516 kcal  Protein:  110-125 grams  Fluid:  >/= 1.5 L or per MD   Sherrine MaplesMelissa Jeancarlos Marchena, Dietetic Intern

## 2017-11-08 NOTE — Progress Notes (Signed)
Pt coded 2245 see code sheet.   MD Hammonds pronounced dead at 2250.  Family at bedside, questions answered, support given, chaplain paged.

## 2017-11-08 NOTE — Progress Notes (Signed)
Pt transferred to 2 M without incident.

## 2017-11-08 NOTE — Death Summary Note (Signed)
DEATH SUMMARY   Patient Details  Name: Tanya SpikeMargaret A Eakle MRN: [id number] DOB: [birth year]  Admission/Discharge Information   Admit Date:  10/10/2017  Date of Death:  01/08/2018  Time of Death:  22:50  Length of Stay: 2  Referring Physician: [name], Ajay, MD   Reason(s) for Hospitalization  CHF Exacerbation  Diagnoses  Preliminary cause of death:  Secondary Diagnoses (including complications and co-morbidities):  Principal Problem:   Biventricular heart failure, NYHA class 2 (HCC) Active Problems:   Type 2 diabetes mellitus, controlled, with renal complications (HCC)   Essential hypertension   Malnutrition of moderate degree   Bilateral cellulitis of lower leg   Acute on chronic left systolic heart failure (HCC)   Cardiac arrest (HCC)   Pneumothorax, traumatic   Acute encephalopathy  Brief Hospital Course (including significant findings, care, treatment, and services provided and events leading to death)  [name] is a 67 y.o. year old female with history of CHF, CAD, Dm, GERD, COPD, CKD, HTN, who was admited 4/15 for SOB thought to be due to CHF exacerbation. Diuresis was attempted with no response. She developed acute hypoxic and hypercapneic respiratory failure and developed PEA cardiac arrest on 4/16 PM with 10 min CPR prior to ROSC being attained. During the CPR she was noted to have episodes of wide complex tachycardia as well as Afib. She was felt to be at neurologic baseline post resuscitation, so therapeutic hypothermia was not started. On 4/17 she developed a right-sided pneumothorax and pigtail chest tube was placed to suction with good results. Follow-up CXR showed lung to be reinflated. Despite this patient became more hypotensive over the course of the day. Family changed her code status to Partial code, specifying no CPR. At 10pm patient became acutely severely hypoxic requiring high vent settings. Her shock continued to worsen despite addition of multiple  vasopressors. Patient then went into pulseless ventricular tachycardia. Time of death 22:50. Family present at bedside and offered support to them.   Pertinent Labs and Studies  Significant Diagnostic Studies Dg Chest 2 View  Result Date: 10/24/2017 CLINICAL DATA:  Congestive heart failure.  Follow-up. EXAM: CHEST - 2 VIEW COMPARISON:  07/26/2017 FINDINGS: Redemonstration of markedly enlarged cardiac silhouette. Chronic aortic atherosclerosis. Lungs are clear. Question tiny amount of pleural fluid on the left. Pulmonary vascularity does not show pronounced venous hypertension and no edema is present presently. IMPRESSION: Chronic cardiomegaly. Aortic atherosclerosis. Possible tiny amount of pleural fluid on the left. Electronically Signed   By: Paulina FusiMark  Shogry M.D.   On: 10/24/2017 08:31   Ct Head Wo Contrast  Result Date: 12-Jul-2017 CLINICAL DATA:  Acute onset of altered level of consciousness. EXAM: CT HEAD WITHOUT CONTRAST TECHNIQUE: Contiguous axial images were obtained from the base of the skull through the vertex without intravenous contrast. COMPARISON:  None. FINDINGS: Brain: No evidence of acute infarction, hemorrhage, hydrocephalus, extra-axial collection or mass lesion / mass effect. Prominence of the ventricles reflects mild cortical volume loss. Mild cerebellar atrophy is noted. Scattered subcortical white matter change likely reflects small vessel ischemic microangiopathy. 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 The brainstem and fourth ventricle are within normal limits. The basal ganglia are unremarkable in appearance. The cerebral hemispheres demonstrate grossly normal gray-white differentiation. No mass effect or midline shift is seen. Vascular: No hyperdense vessel or unexpected calcification. Skull: There is no evidence of fracture; visualized osseous structures are unremarkable in appearance. Sinuses/Orbits: The orbits are within normal limits. The paranasal  sinuses  and mastoid air cells are well-aerated. Other: No significant soft tissue abnormalities are seen. IMPRESSION: 1. No acute intracranial pathology seen on CT. 2. Mild cortical volume loss and scattered small vessel ischemic microangiopathy. 3. 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 Electronically Signed   By: Roanna Raider M.D.   On: November 02, 2017 03:31   Dg Chest Port 1 View  Result Date: 11-02-17 CLINICAL DATA:  Hypotensive, assess chest tube placement. EXAM: PORTABLE CHEST 1 VIEW COMPARISON:  Chest radiograph October 25, 2016 at 1630 hours FINDINGS: Stable appearance of RIGHT apical chest tube. Endotracheal tube tip projects 3 cm above the carina. LEFT internal jugular central venous catheter distal tip projects in distal superior vena cava. Nasogastric tube past proximal stomach, distal tip out of field-of-view. Stable cardiomegaly. Coronary artery stent. Calcified aortic knob. Persistent retrocardiac consolidation and LEFT lung base volume loss. Bronchitic changes. Small LEFT pleural effusion. No pneumothorax. Similar to decreased RIGHT chest wall subcutaneous gas. IMPRESSION: Stable appearance of life-support lines including RIGHT apical chest tube. Persistent retrocardiac consolidation and bronchitic changes. Small LEFT pleural effusion. Stable cardiomegaly. Aortic Atherosclerosis (ICD10-I70.0). Electronically Signed   By: Awilda Metro M.D.   On: 02-Nov-2017 22:20   Dg Chest Port 1 View  Result Date: 02-Nov-2017 CLINICAL DATA:  Respiratory failure EXAM: PORTABLE CHEST 1 VIEW COMPARISON:  Earlier today FINDINGS: New small bore right chest tube with retention loop at the apex. A right-sided pneumothorax has been evacuated. Endotracheal tube tip just below the clavicular heads. An orogastric tube reaches the stomach. Left IJ line with tip at the SVC level. Lower lung opacities, particularly dense behind the heart. This could reflect infection or atelectasis with small left  effusion. Chronic cardiomegaly. Coronary stenting. IMPRESSION: 1. New right-sided chest tube with evacuated pneumothorax. 2. Otherwise stable chest as described. Electronically Signed   By: Marnee Spring M.D.   On: 11/02/2017 16:56   Dg Chest Port 1 View  Result Date: 11/02/2017 CLINICAL DATA:  Dyspnea, oxygen saturation, hypertension, diabetes mellitus, coronary artery disease, CHF EXAM: PORTABLE CHEST 1 VIEW COMPARISON:  Portable exam 1218 hours compared to 0226 hours FINDINGS: Tip of endotracheal tube projects 3.9 cm above carina. Nasogastric tube extends into stomach. External pacing leads project over chest. LEFT subclavian central venous catheter tip projects over SVC above cavoatrial junction. Minimal cardiac enlargement. Coronary arterial stents noted. Persistent pulmonary infiltrates greater on LEFT. Atelectasis versus consolidation LEFT lower lobe. Moderate RIGHT pneumothorax estimated at 20-25%. No obvious mediastinal shift identified on exam which is significantly rotated to the LEFT. No LEFT-sided pneumothorax. IMPRESSION: RIGHT-side pneumothorax estimated at 20-25% without obvious mediastinal shift on exam which is rotated to the LEFT. Persistent BILATERAL pulmonary infiltrates with atelectasis versus consolidation in LEFT lower lobe. Critical Value/emergent results were called by telephone at the time of interpretation on 11-02-2017 at 12:47 pm to Edison Pace RN in MICU, who verbally acknowledged these results. Electronically Signed   By: Ulyses Southward M.D.   On: 2017/11/02 12:48   Dg Chest Port 1 View  Result Date: 11-02-17 CLINICAL DATA:  Central line placement EXAM: PORTABLE CHEST 1 VIEW COMPARISON:  10/26/2017, 10/24/2017, 07/26/2017 FINDINGS: Endotracheal tube tip is about 3 cm superior to the carina. Esophageal tube tip is below the diaphragm but non included. Left IJ central venous catheter tip overlies the SVC. No left pneumothorax. Cardiomegaly with worsened vascular congestion  and edema. Increasing airspace disease at the bilateral lung bases, left greater than right. IMPRESSION: 1. New left IJ central venous catheter with  tip projecting over the SVC. Negative for pneumothorax 2. Cardiomegaly with worsening vascular congestion and pulmonary edema. Increasing airspace disease at the bilateral lung bases. Electronically Signed   By: Jasmine Pang M.D.   On: 11-14-2017 02:47   Dg Chest Port 1 View  Result Date: 10/24/2017 CLINICAL DATA:  Post CPR EXAM: PORTABLE CHEST 1 VIEW COMPARISON:  10/24/2017, 07/26/2017 FINDINGS: Interval intubation, tip of the endotracheal tube is about 1 cm superior to the carina. Esophageal tube tip is below the diaphragm but non included. Cardiomegaly with vascular congestion and mild perihilar edema suspected. Patchy atelectasis at the left base. No pneumothorax. IMPRESSION: 1. Endotracheal tube tip about 1 cm superior to carina. Esophageal tube tip below the diaphragm but non included 2. Cardiomegaly with vascular congestion and mild diffuse ground-glass opacities suspicious for edema. Electronically Signed   By: Jasmine Pang M.D.   On: 10/24/2017 23:51    Microbiology Recent Results (from the past 240 hour(s))  MRSA PCR Screening     Status: None   Collection Time: Nov 14, 2017 12:34 AM  Result Value Ref Range Status   MRSA by PCR NEGATIVE NEGATIVE Final    Comment:        The GeneXpert MRSA Assay (FDA approved for NASAL specimens only), is one component of a comprehensive MRSA colonization surveillance program. It is not intended to diagnose MRSA infection nor to guide or monitor treatment for MRSA infections. Performed at [facility], [address], [place], [place] [address]    Lab Basic Metabolic Panel: Recent Labs  Lab 10/17/2017 1252 10/24/17 0256 10/24/17 2356 2017/11/14 1041 Nov 14, 2017 2025  NA 140 138 145 141 139  K 3.6 3.3* 4.4 2.8* 3.9  CL 108 110 110 111 110  CO2 15* 17* 16* 18* 15*  GLUCOSE 90 111* 238* 136*  144*  BUN 63* 62* 59* 63* 57*  CREATININE 2.34* 2.30* 2.34* 2.61* 2.58*  CALCIUM 8.9 8.3* 7.4* 8.0* 7.4*  MG  --   --  2.4 1.9  --   PHOS  --   --  4.8* 2.5  --    Liver Function Tests: Recent Labs  Lab 10/26/2017 1252 10/24/17 2356  AST 17 29  ALT 10* 8*  ALKPHOS 71 69  BILITOT 3.0* 1.6*  PROT 7.7 5.4*  ALBUMIN 3.5 2.3*   No results for input(s): LIPASE, AMYLASE in the last 168 hours. No results for input(s): AMMONIA in the last 168 hours. CBC: Recent Labs  Lab 10/22/2017 1252 10/24/17 2356 14-Nov-2017 1041 2017-11-14 2202  WBC 4.6 8.4 10.8*  --   NEUTROABS 2.8 4.0  --   --   HGB 15.5* 14.6 14.7 14.0  HCT 45.6 45.4 43.7 40.2  MCV 78.5 81.7 78.5  --   PLT 167 97* 114*  --    Cardiac Enzymes: Recent Labs  Lab 11/02/2017 1900 10/24/17 0256 10/24/17 2356 11-14-2017 1041 11-14-17 2025  TROPONINI 0.05* 0.04* 0.06* 0.76* 0.88*   Sepsis Labs: Recent Labs  Lab 11/06/2017 1252 10/24/17 2354 10/24/17 2356 11/14/17 1041 Nov 14, 2017 2025  PROCALCITON  --   --  0.28  --   --   WBC 4.6  --  8.4 10.8*  --   LATICACIDVEN  --  10.3*  --  3.1* 2.8*   Procedures/Operations  CPR 10/24/17 Intubation 10/24/17 Arterial line placement 10/24/17 Central venous catheter placement 10/24/17 Chest tube placement 10/24/17  Curt Jews Dov Dill 11-14-17, 10:52 PM

## 2017-11-08 NOTE — Progress Notes (Signed)
Call to patient's room by RN due to low O2 saturation of  20%. Patient was flat in bed in Trendelenburg position. Eyes were open. VS: Tachycardic HR 110-120, BP 62/40s, and unreadable  O2 stats  Levophed started with improvement in BP and MAP. Stat CXR ordered. Concern for PE in the setting of tachycardia and new hypoxia while on vent and tPA ordered. ABG showed pO2 99 arguing against this and tPA not given. CXR revealed R -sided pneumothorax (20-25%).   Plan: placement of R sided chest tube. Consent obtained from family (daughter who is Media planner). Patient currently stable on dobutamine and dopamine. Levophed discontinued.   Dr. Debbora Dus and I met with family this afternoon. They would not like interventions that would cause pain to the patient including chest compressions and shocks. They would like to continue medications and proceed with R chest tube placement for R lung PTX. Discussed poor prognosis of patient in the setting of severe biventricular heart failure. Family understands. They are agreeable to continue current interventions for the next couple of days at which time they will consider withdrawing care if patient's clinical status continues to deteriorate.

## 2017-11-08 NOTE — Procedures (Signed)
Central Venous Catheter Insertion Procedure Note Tanya SpikeMargaret A [name] [id number] [birth year]  Procedure: Insertion of Central Venous Catheter Indications: Assessment of intravascular volume, Drug and/or fluid administration and Frequent blood sampling  Procedure Details Consent: Risks of procedure as well as the alternatives and risks of each were explained to the (patient/caregiver).  Consent for procedure obtained. Time Out: Verified patient identification, verified procedure, site/side was marked, verified correct patient position, special equipment/implants available, medications/allergies/relevent history reviewed, required imaging and test results available.  Performed  Maximum sterile technique was used including antiseptics, cap, gloves, gown, hand hygiene, mask and sheet. Skin prep: Chlorhexidine; local anesthetic administered A antimicrobial bonded/coated triple lumen catheter was placed in the left internal jugular vein using the Seldinger technique at 19 cm.  Line sutured.  Biopatch placed.  Sterile dressing applied.   Evaluation Blood flow good Complications: No apparent complications Patient did tolerate procedure well. Chest X-ray ordered to verify placement.  CXR: pending.  Procedure performed ultrasound guidance for real time vessel cannulation.      Tanya BoyerBrooke Hasson [name], AGACNP-BC [facility] Pgr: [phone] or if no answer [phone] 10/23/2017, 2:27 AM

## 2017-11-08 DEATH — deceased

## 2018-08-20 IMAGING — DX DG CHEST 2V
2 series · 2 of 2 positions shown · non-contrast
Comparison: 07/11/2017 chest x-ray, 05/21/2017 chest x-ray

CLINICAL DATA: Bilateral leg wounds

EXAM:
CHEST  2 VIEW

[chest lat]
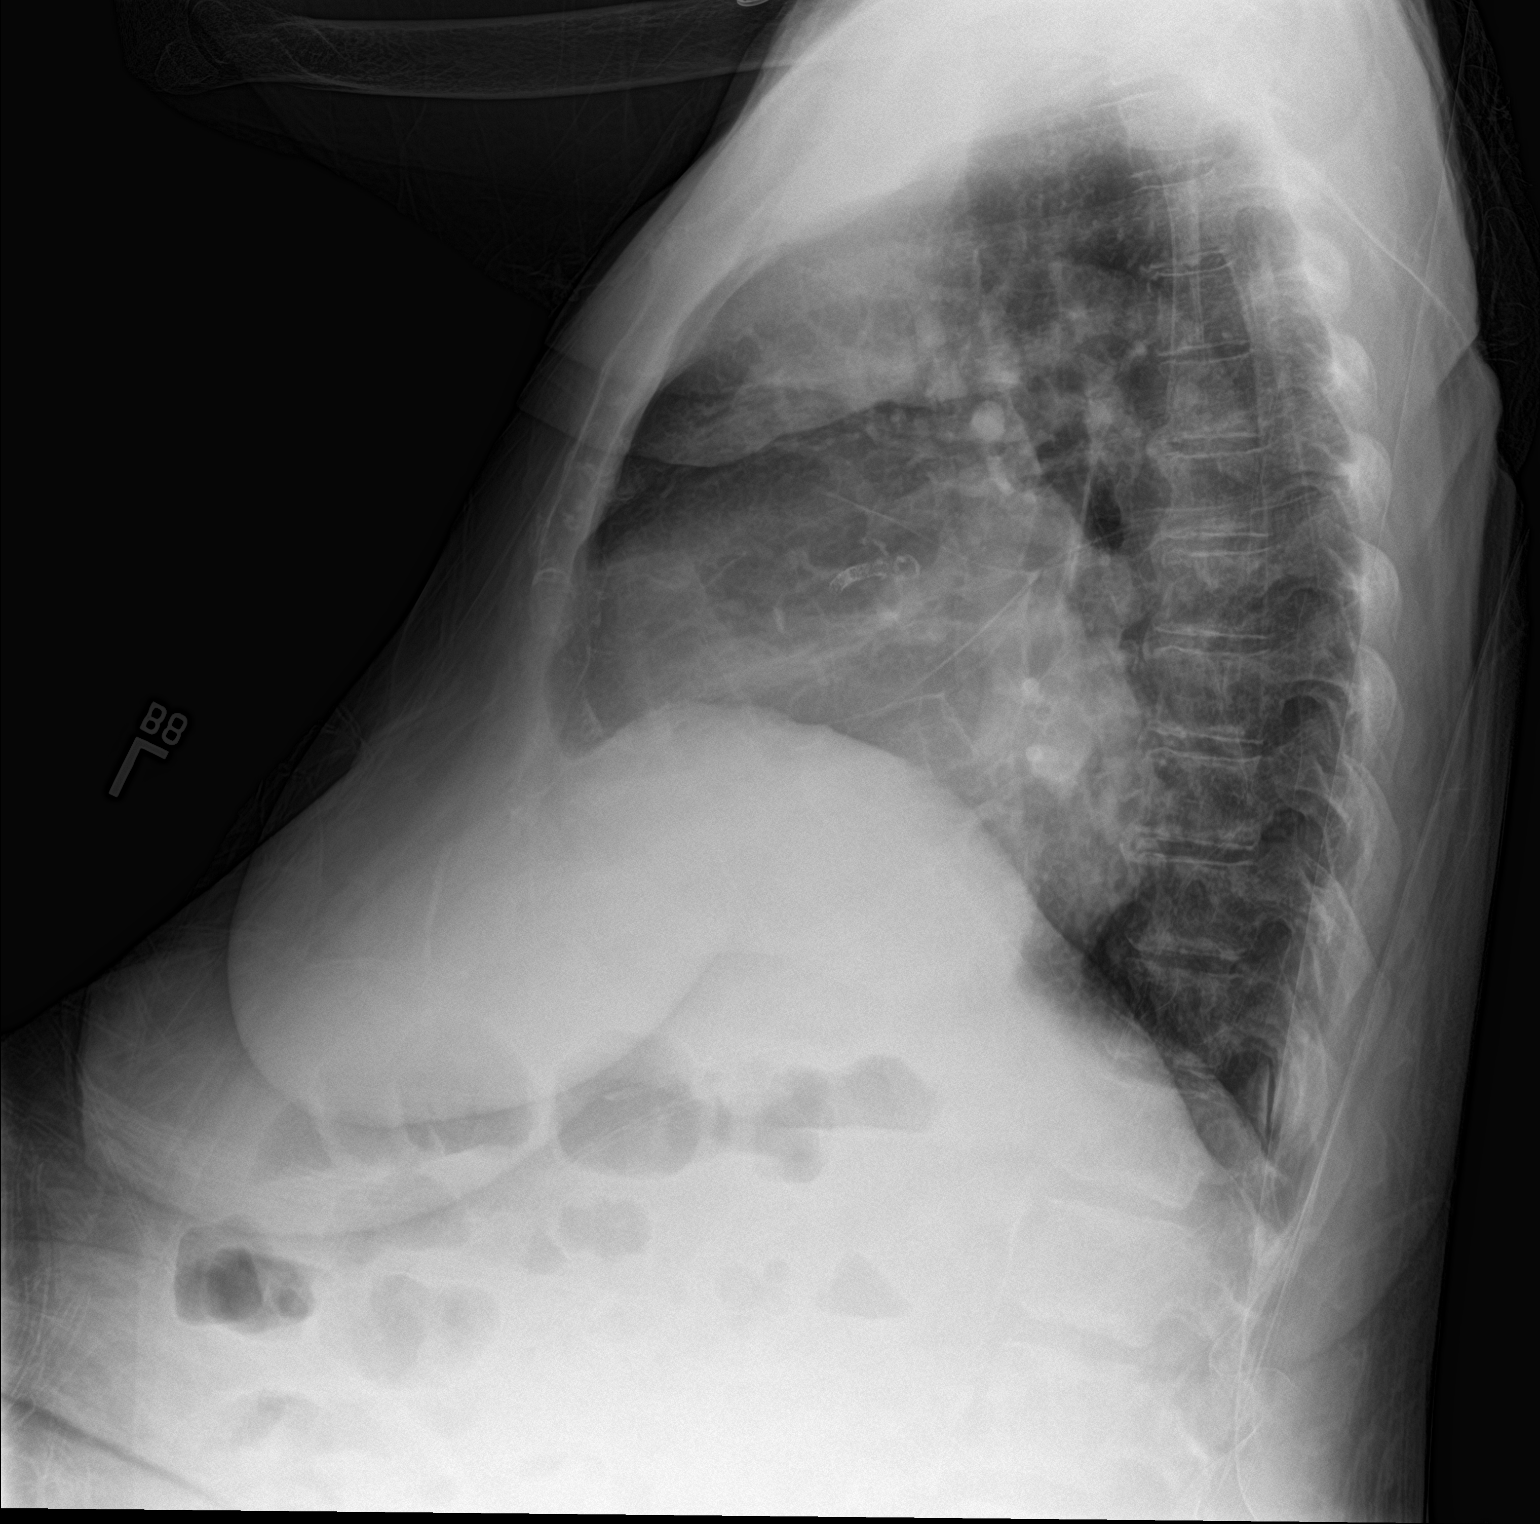

[chest ap]
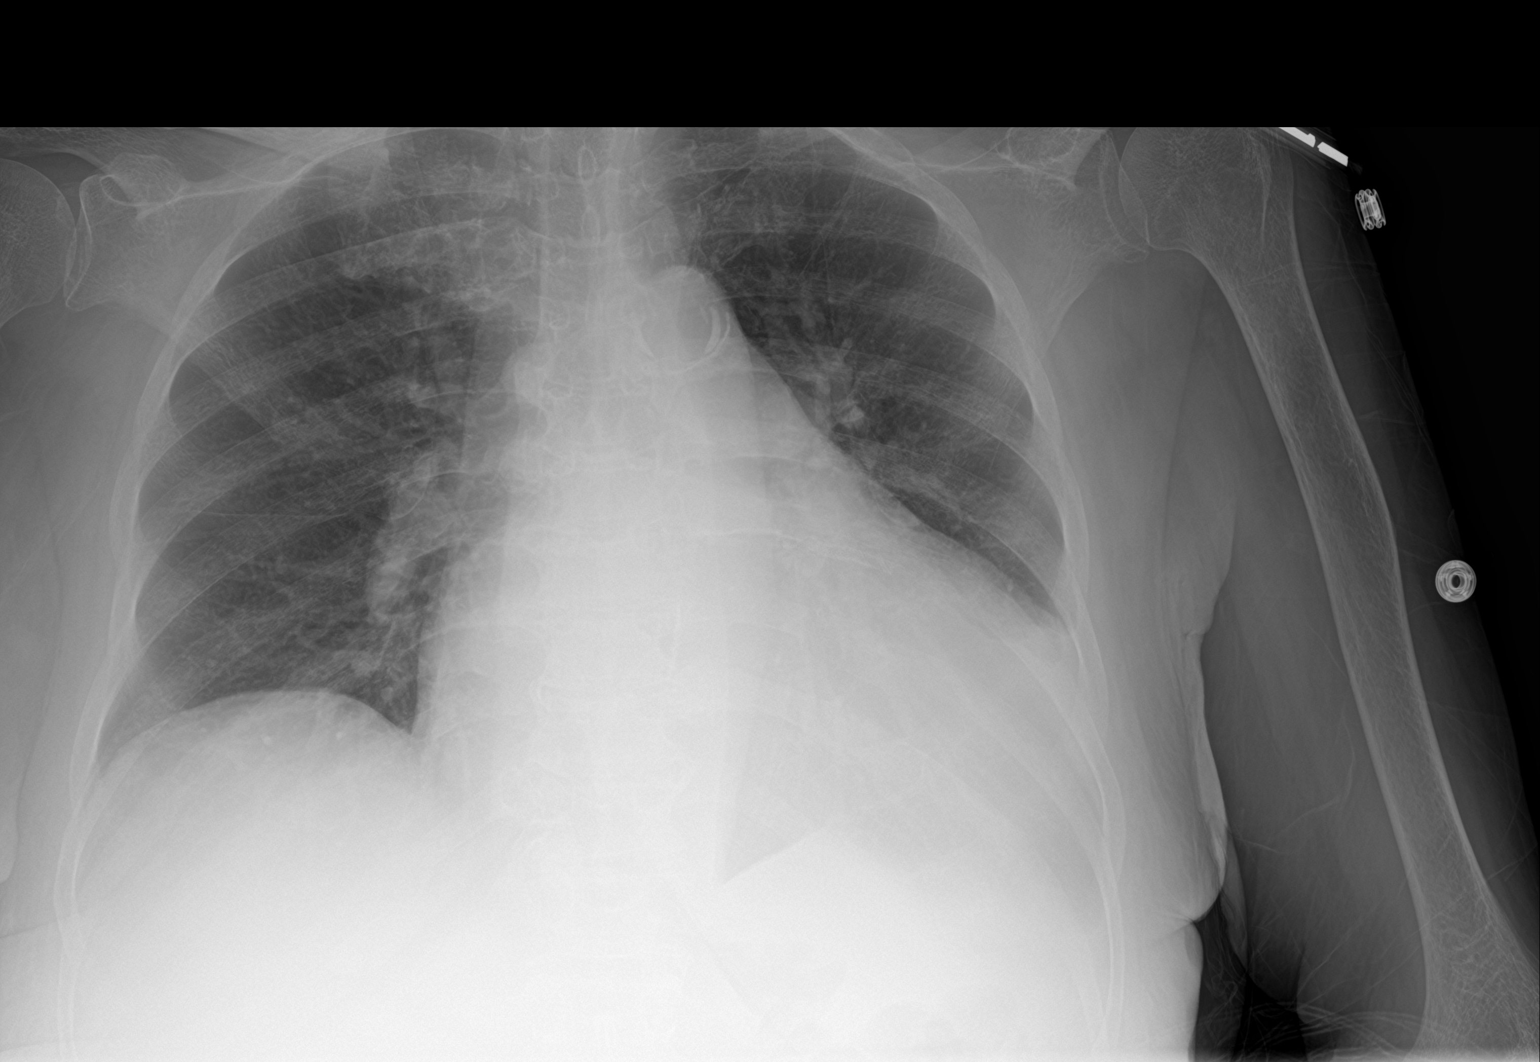

[2 of 2 positions shown; findings below may reference images not displayed]

FINDINGS: Cardiomegaly with vascular congestion. No pleural effusion or focal
consolidation. Aortic atherosclerosis. No pneumothorax. Degenerative
changes of the spine.
IMPRESSION: Cardiomegaly with mild central congestion. No edema or pleural
effusion.

## 2018-12-05 ENCOUNTER — Other Ambulatory Visit: Payer: Self-pay | Admitting: *Deleted
